# Patient Record
Sex: Male | Born: 1945 | Race: White | Hispanic: No | Marital: Married | Smoking: Never smoker
Health system: Southern US, Community
[De-identification: ages and names within clinical notes are randomized; demographics above are authoritative.]

## PROBLEM LIST (undated history)

## (undated) DIAGNOSIS — I2510 Atherosclerotic heart disease of native coronary artery without angina pectoris: Secondary | ICD-10-CM

## (undated) DIAGNOSIS — M069 Rheumatoid arthritis, unspecified: Secondary | ICD-10-CM

## (undated) DIAGNOSIS — C4490 Unspecified malignant neoplasm of skin, unspecified: Secondary | ICD-10-CM

## (undated) DIAGNOSIS — I10 Essential (primary) hypertension: Secondary | ICD-10-CM

## (undated) DIAGNOSIS — Z95810 Presence of automatic (implantable) cardiac defibrillator: Secondary | ICD-10-CM

## (undated) DIAGNOSIS — E782 Mixed hyperlipidemia: Secondary | ICD-10-CM

## (undated) DIAGNOSIS — N189 Chronic kidney disease, unspecified: Secondary | ICD-10-CM

## (undated) DIAGNOSIS — R001 Bradycardia, unspecified: Secondary | ICD-10-CM

## (undated) DIAGNOSIS — I509 Heart failure, unspecified: Secondary | ICD-10-CM

## (undated) DIAGNOSIS — I4891 Unspecified atrial fibrillation: Secondary | ICD-10-CM

## (undated) DIAGNOSIS — I4729 Other ventricular tachycardia: Secondary | ICD-10-CM

## (undated) DIAGNOSIS — I480 Paroxysmal atrial fibrillation: Secondary | ICD-10-CM

## (undated) DIAGNOSIS — I472 Ventricular tachycardia: Secondary | ICD-10-CM

## (undated) DIAGNOSIS — E7800 Pure hypercholesterolemia, unspecified: Secondary | ICD-10-CM

## (undated) DIAGNOSIS — K2270 Barrett's esophagus without dysplasia: Secondary | ICD-10-CM

## (undated) DIAGNOSIS — K219 Gastro-esophageal reflux disease without esophagitis: Secondary | ICD-10-CM

## (undated) DIAGNOSIS — Z87442 Personal history of urinary calculi: Secondary | ICD-10-CM

## (undated) DIAGNOSIS — I440 Atrioventricular block, first degree: Secondary | ICD-10-CM

## (undated) DIAGNOSIS — G4730 Sleep apnea, unspecified: Secondary | ICD-10-CM

## (undated) DIAGNOSIS — R7303 Prediabetes: Secondary | ICD-10-CM

## (undated) HISTORY — PX: HERNIA REPAIR: SHX51

## (undated) HISTORY — DX: Bradycardia, unspecified: R00.1

## (undated) HISTORY — DX: Other ventricular tachycardia: I47.29

## (undated) HISTORY — DX: Atherosclerotic heart disease of native coronary artery without angina pectoris: I25.10

## (undated) HISTORY — PX: KIDNEY STONE SURGERY: SHX686

## (undated) HISTORY — DX: Ventricular tachycardia: I47.2

## (undated) HISTORY — DX: Mixed hyperlipidemia: E78.2

## (undated) HISTORY — DX: Essential (primary) hypertension: I10

## (undated) HISTORY — DX: Barrett's esophagus without dysplasia: K22.70

## (undated) HISTORY — DX: Paroxysmal atrial fibrillation: I48.0

## (undated) HISTORY — PX: SKIN CANCER EXCISION: SHX779

## (undated) HISTORY — PX: INSERT / REPLACE / REMOVE PACEMAKER: SUR710

## (undated) HISTORY — DX: Atrioventricular block, first degree: I44.0

---

## 1977-03-13 HISTORY — PX: OTHER SURGICAL HISTORY: SHX169

## 2008-06-17 ENCOUNTER — Encounter: Payer: Self-pay | Admitting: Cardiology

## 2008-06-19 ENCOUNTER — Encounter: Payer: Self-pay | Admitting: Cardiology

## 2008-07-02 ENCOUNTER — Encounter: Payer: Self-pay | Admitting: Cardiology

## 2008-08-19 ENCOUNTER — Encounter: Payer: Self-pay | Admitting: Cardiology

## 2009-01-27 ENCOUNTER — Encounter: Payer: Self-pay | Admitting: Cardiology

## 2009-03-13 DIAGNOSIS — K2270 Barrett's esophagus without dysplasia: Secondary | ICD-10-CM

## 2009-03-13 HISTORY — DX: Barrett's esophagus without dysplasia: K22.70

## 2009-11-09 ENCOUNTER — Encounter: Payer: Self-pay | Admitting: Cardiology

## 2009-11-17 ENCOUNTER — Encounter: Payer: Self-pay | Admitting: Cardiology

## 2010-01-28 ENCOUNTER — Encounter: Payer: Self-pay | Admitting: Cardiology

## 2010-01-31 DIAGNOSIS — I10 Essential (primary) hypertension: Secondary | ICD-10-CM | POA: Insufficient documentation

## 2010-02-01 ENCOUNTER — Ambulatory Visit: Payer: Self-pay | Admitting: Cardiology

## 2010-02-01 DIAGNOSIS — M069 Rheumatoid arthritis, unspecified: Secondary | ICD-10-CM | POA: Insufficient documentation

## 2010-02-01 DIAGNOSIS — I493 Ventricular premature depolarization: Secondary | ICD-10-CM | POA: Insufficient documentation

## 2010-02-02 ENCOUNTER — Ambulatory Visit: Payer: Self-pay | Admitting: Cardiovascular Disease

## 2010-02-02 ENCOUNTER — Encounter: Payer: Self-pay | Admitting: Cardiology

## 2010-02-02 ENCOUNTER — Ambulatory Visit (HOSPITAL_COMMUNITY): Admission: RE | Admit: 2010-02-02 | Discharge: 2010-02-02 | Payer: Self-pay | Admitting: Cardiology

## 2010-02-02 ENCOUNTER — Ambulatory Visit: Payer: Self-pay | Admitting: Cardiology

## 2010-02-02 ENCOUNTER — Ambulatory Visit: Payer: Self-pay

## 2010-02-08 ENCOUNTER — Telehealth (INDEPENDENT_AMBULATORY_CARE_PROVIDER_SITE_OTHER): Payer: Self-pay | Admitting: *Deleted

## 2010-02-18 ENCOUNTER — Encounter: Payer: Self-pay | Admitting: Internal Medicine

## 2010-02-18 ENCOUNTER — Ambulatory Visit: Payer: Self-pay | Admitting: Internal Medicine

## 2010-03-02 ENCOUNTER — Ambulatory Visit: Payer: Self-pay | Admitting: Internal Medicine

## 2010-03-02 ENCOUNTER — Encounter: Payer: Self-pay | Admitting: Internal Medicine

## 2010-03-02 DIAGNOSIS — R5381 Other malaise: Secondary | ICD-10-CM | POA: Insufficient documentation

## 2010-03-02 DIAGNOSIS — R5383 Other fatigue: Secondary | ICD-10-CM

## 2010-03-02 DIAGNOSIS — R002 Palpitations: Secondary | ICD-10-CM | POA: Insufficient documentation

## 2010-03-18 ENCOUNTER — Ambulatory Visit: Admit: 2010-03-18 | Payer: Self-pay | Admitting: Cardiology

## 2010-04-11 ENCOUNTER — Ambulatory Visit
Admission: RE | Admit: 2010-04-11 | Discharge: 2010-04-11 | Payer: Self-pay | Source: Home / Self Care | Attending: Internal Medicine | Admitting: Internal Medicine

## 2010-04-11 DIAGNOSIS — I480 Paroxysmal atrial fibrillation: Secondary | ICD-10-CM | POA: Insufficient documentation

## 2010-04-12 NOTE — Letter (Signed)
Summary: Internal Other/ PATIENT HISTORY FORM  Internal Other/ PATIENT HISTORY FORM   Imported By: Dorise Hiss 02/09/2010 12:11:29  _____________________________________________________________________  External Attachment:    Type:   Image     Comment:   External Document

## 2010-04-12 NOTE — Letter (Signed)
Summary: External Correspondence/ [facility]  External Correspondence/ [facility]   Imported By: Dorise Hiss 01/10/2010 15:21:31  _____________________________________________________________________  External Attachment:    Type:   Image     Comment:   External Document

## 2010-04-12 NOTE — Consult Note (Signed)
Summary: Consultation Report/ [facility]  Consultation Report/ [facility]   Imported By: Dorise Hiss 01/10/2010 15:04:22  _____________________________________________________________________  External Attachment:    Type:   Image     Comment:   External Document

## 2010-04-12 NOTE — Letter (Signed)
Summary: External Correspondence/ [facility]  External Correspondence/ [facility]   Imported By: Dorise Hiss 01/10/2010 15:20:13  _____________________________________________________________________  External Attachment:    Type:   Image     Comment:   External Document

## 2010-04-12 NOTE — Assessment & Plan Note (Signed)
Summary: NP- establish WITH CARDIOLOGIST   Visit Type:  Initial Consult Primary Provider:  Noelle Penner)   History of Present Illness: the patient is a 65 year old male previously seen at [facility] with a history of palpitations, ventricular ectopic beats and paroxysmal atrial fibrillation. He is a former patient of Dr. Daryel November. 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 His catheterization was in 2005. An echocardiogram at that time revealed a normal ejection fraction and no significant valvular abnormalities. The patient was initially placed on atenolol and aspirin but could not tolerate atenolol. Due to frequent symptomatic palpitations he underwent a Holter monitor in March of 2010 which showed an average heart rate of 71 beats per minute. His minimal heart rate was 37 beats per minute and his maximal heart rate was 125 beats per minute. He 30,956 multiformed PVCs in a 48 hour period. The predominant PVC morphology appear to be from the right ventricular outflow tract. The longest run of PVCs of 6 beats of nonsustained ventricular tachycardia which was asymptomatic. He also had multiple PACs with the longest run being 3 beats. The patient had no documented atrial fibrillation during this period. Medical therapy was prescribed for suppression of PVCs due to the increased risk of tachycardia-induced cardiomyopathy, due to the high burden of PVCs. The patient was initial bisoprolol but for some unclear reason this has been discontinued. The records are somewhat scant and it appears that at some point he was also on Flecainidewhich he could not tolerate secondary to headaches. The patient had an MRI to rule out arrhythmogenic right ventricular dysplasia. 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 This was felt to be a  nonspecific finding. The patient now wants his care transferred to our office. He continues to have frequent palpitations. Particularly at night he feels sometimes his heart beating out of his chest. this does make her feel anxious at times. However throughout the day he can feel his heartbeat is irregular and clearly has symptomatic palpitations. He states the other night his heartbeat was irregular but it was very uncomfortable and had to sit outside of his carport. His blood pressure was elevated also at that time.  The patient abruptly 2 months ago has been started on prednisone and methotrexate for a presumptive diagnosis of rheumatoid arthritis. Since initiating prednisone he feels that his blood pressure has been much higher. Of note is that the patient's last echocardiogram at [facility] at 2010 normal ejection fraction. dr. Octaviano Glow in [place] is the patient's orthopedic surgeon his phone number is [phone].  Preventive Screening-Counseling & Management  Alcohol-Tobacco     Smoking Status: never  Current Medications (verified): 1)  Methotrexate 2.5 Mg Tabs (Methotrexate Sodium) .... Take 5 By Mouth Weekly 2)  Folic Acid 1 Mg Tabs (Folic Acid) .... Take 1 Tablet By Mouth Once A Day 3)  Prednisone 5 Mg Tabs (Prednisone) .... Take 1 Tablet By Mouth Once A Day 4)  Aspir-Low 81 Mg Tbec (Aspirin) .... Take 1 Tablet By Mouth Once A Day 5)  Multivitamins  Tabs (Multiple Vitamin) .... Take 1 Tablet By Mouth Once A Day 6)  Cranberry Juice Extract 1000 Mg Caps (Cranberry) .... Take 2 Tablet By Mouth Once A Day 7)  Vitamin B-12 1000 Mcg Tabs (Cyanocobalamin) .... Take 1 Tablet By Mouth Once A Day 8)  Nexium 40  Mg Cpdr (Esomeprazole Magnesium) .... Take 1 Tablet By Mouth Once A Day 9)  Acebutolol Hcl 400 Mg Caps (Acebutolol Hcl) .... Take 1 1/2 Tab (600mg ) Daily  Allergies (verified): No Known Drug Allergies  Comments:  Nurse/Medical Assistant: The patient's medication list and  allergies were reviewed with the patient and were updated in the Medication and Allergy Lists.  Past History:  Past Medical History: Last updated: 01/31/2010 Palpitations Ventricular ectopic beats Paroxysmal atrial fibrillation Long term use of ASA Rt. 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  The patient denies fatigue, malaise, fever, weight gain/loss, vision loss, decreased hearing, hoarseness, chest pain, shortness of breath, prolonged cough, wheezing, sleep apnea, coughing up blood, abdominal pain, blood in stool, nausea, vomiting, diarrhea, heartburn, incontinence, blood in urine, muscle weakness, joint pain, leg swelling, rash, skin lesions, headache, fainting, dizziness, depression, anxiety, enlarged lymph nodes, easy bruising or bleeding, and environmental allergies.    Vital Signs:  Patient profile:   65 year old male Height:      67 inches Weight:      150 pounds BMI:     23.58 Pulse rate:   65 / minute BP sitting:   171 / 77  (left arm) Cuff size:   regular  Vitals Entered By: Carlye Grippe (February 01, 2010 8:30 AM)   Physical Exam  Additional Exam:  General: Well-developed, well-nourished in no distress head: Normocephalic and atraumatic eyes PERRLA/EOMI  intact, conjunctiva and lids normal nose: No deformity or lesions mouth normal dentition, normal posterior pharynx neck: Supple, no JVD.  No masses, thyromegaly or abnormal cervical nodes lungs: Normal breath sounds bilaterally without wheezing.  Normal percussion heart: regular rate and rhythm with normal S1 and S2, no S3 or S4.  PMI is normal.  No pathological murmurs abdomen: Normal bowel sounds, abdomen is soft and nontender without masses, organomegaly or hernias noted.  No hepatosplenomegaly musculoskeletal: Back normal, normal gait muscle strength and tone normal pulsus: Pulse is normal in all 4 extremities Extremities: No peripheral pitting edema neurologic: Alert and oriented x 3 skin: Intact without lesions or rashes cervical nodes: No significant adenopathy psychologic: Normal affect    EKG  Procedure date:  02/01/2010  Findings:      EKG with baseline wandering. Sinus rhythm with first degree AV block. No acute ischemic changes. 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 This was however several years ago. Currently the patient is not on beta blocker anymore.I am not exactly sure why bisoprolol was discontinued. The patient had an MRI done which was reportedly negative for possibility of arrhythmogenic right  ventricular dysplasia. However the test is that of 100% sensitivity. On the present EKG there are no PVCs to suggest where the arrhythmogenic focus is. 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 I still wonder if this could raise the possibility of a ARVC. We will repeat an echocardiogram to reassess LV function, particularly to rule out that the fact the  patient does not have a tachycardia induced cardiomyopathy from frequent PVCs. In addition we'll evaluate RV function. I also ordered a 48-hour Holter monitor to assess the frequency of his PVCs as well as to further understand the morphology. In the interim I started the patient on acebutolol 600 milligrams p.o. q. daily.hopefully he can tolerate his beta blocker better than the atenolol and bisoprolol. Again of note is that he has been tried on flecainide before but could not tolerate this. I have referred the patient to Dr. Johney Frame to make sure that he truly has ventricular tachycardia/PVCs in the absence of structural heart disease and no further workup is required particularly for arrhythmogenic right ventricular dysplasia. Also if the patient does not respond to his medical therapy consideration may be given to EP study and RVOT ablation . The following medications were removed from the medication list:    Bisoprolol Fumarate 5 Mg Tabs (Bisoprolol fumarate) .[name] Kitchen... Take 1/2 tablet by mouth once a day His updated medication list for this problem includes:    Aspir-low 81 Mg Tbec (Aspirin) .[name] Kitchen... Take 1 tablet by mouth once a day    Acebutolol Hcl 400 Mg Caps (Acebutolol hcl) .[name] Kitchen... 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 Take 1/2 tablet by mouth once a day His updated medication list for this problem includes:    Aspir-low 81 Mg Tbec (Aspirin) .[name] Kitchen... Take 1 tablet by mouth once a day    Acebutolol Hcl 400 Mg Caps (Acebutolol hcl) .[name] Kitchen... 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 He feels his blood pressure is elevated  after he started to medications. This likely could be from his prednisone. I told him the vestibule is also on antihypertensive medication. We will continue to monitor this  Other Orders: EKG w/ Interpretation (93000) Holter Monitor (Holter Monitor) 2-D Echocardiogram (2D Echo)  Patient Instructions: 1)  Begin Acebutolol 600mg  daily 2)  Echo  3)  48 hour monitor 4)  EP appointment with Dr. Johney Frame  5)  Follow up in  4 weeks Prescriptions: ACEBUTOLOL HCL 400 MG CAPS (ACEBUTOLOL HCL) take 1 1/2 tab (600mg ) daily  #45 x 6   Entered by:   Hoover Brunette, LPN   Authorized by:   Lewayne Bunting, MD, [facility]   Signed by:   Hoover Brunette, LPN on 98/01/9146   Method used:   Electronically to        [facility] [phone]* (retail)       [address] [place], [place]  [address]       Ph: [phone]       Fax: [phone]   RxID:   [id number]   Handout requested.

## 2010-04-12 NOTE — Letter (Signed)
Summary: External Correspondence/ [facility]  External Correspondence/ [facility]   Imported By: Dorise Hiss 01/13/2010 15:16:19  _____________________________________________________________________  External Attachment:    Type:   Image     Comment:   External Document

## 2010-04-12 NOTE — Progress Notes (Signed)
Summary: SIDE EFFECTS TO ACEBUTOLOL INCREASE  Phone Note Call from Patient Call back at Home Phone [phone]   Caller: Patient Call For: nurse Summary of Call: since taking increase of acebutolol patient has been itching. Please advise. Initial call taken by: Carlye Grippe,  February 08, 2010 8:20 AM  Follow-up for Phone Call        would call back and see if itching is persistent.  If not continue acebutolol probably unlikely that medication is the cause, but possible.  Follow-up by: Lewayne Bunting, MD, [facility],  February 18, 2010 9:17 AM  Additional Follow-up for Phone Call Additional follow up Details #1::        spoke with patient and he said that he decreased the medication back to 400mg  and the itching went away. Additional Follow-up by: Carlye Grippe,  February 18, 2010 9:35 AM    Additional Follow-up for Phone Call Additional follow up Details #2::    OK- continue curent dose.  Follow-up by: Lewayne Bunting, MD, [facility],  February 18, 2010 10:14 AM

## 2010-04-14 NOTE — Assessment & Plan Note (Signed)
Summary: ROV REASSES HEART RATE AND HOW PT IS FEELING/KL   Visit Type:  Follow-up Referring Provider:  Dr Andee Lineman Primary Provider:  Noelle Penner)   History of Present Illness: The patient presents today for routine electrophysiology followup. He reports doing reasonably well since last being seen in our clinic.  He has stopped acetabulol but continues to have persistent fatigue.  He also has occasional palpitations, worse at night.  He remains very active. The patient denies symptoms of  chest pain, shortness of breath, orthopnea, PND, lower extremity edema, dizziness, presyncope, syncope, or neurologic sequela. The patient is tolerating medications without difficulties and is otherwise without complaint today.   Preventive Screening-Counseling & Management  Alcohol-Tobacco     Smoking Status: never  Current Medications (verified): 1)  Multivitamins  Tabs (Multiple Vitamin) .... Take 1 Tablet By Mouth Once A Day 2)  Cranberry Juice Extract 1000 Mg Caps (Cranberry) .... Take 2 Tablet By Mouth Once A Day 3)  Vitamin B-12 1000 Mcg Tabs (Cyanocobalamin) .... Take 1 Tablet By Mouth Once A Day 4)  Nexium 40 Mg Cpdr (Esomeprazole Magnesium) .... Take 1 Tablet By Mouth Once A Day  Allergies (verified): No Known Drug Allergies  Comments:  Nurse/Medical Assistant: The patient's problems were reviewed and were updated in the Problem List.  Bottles reviewed w/ patient.  Tammi Romine CMA (March 02, 2010 1:45 PM)  Past History:  Past Medical History: Reviewed history from 02/18/2010 and no changes required. Frequent PVCs Paroxysmal atrial fibrillation (not well documented) Rheumatoid arthritis Nephrolithiasis Resting bradycardia Left heart catheterization 2005 which revealed a nonobstructive disease.  Past Surgical History: Reviewed history from 02/18/2010 and no changes required. Cardiac Catheterization  2005 revealed a 25% mid LAD lesion and otherwise normal  coronaries His EF at that time 2005 was 60% left leg surgery 1979 Hernia repair   Social History: Reviewed history from 02/18/2010 and no changes required. 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 High School Education Alcohol Use - occasional Tob- none Has one son who is healthy  Review of Systems       All systems are reviewed and negative except as listed in the HPI.   Vital Signs:  Patient profile:   65 year old male Height:      67 inches Weight:      151 pounds BMI:     23.74 Pulse rate:   65 / minute BP sitting:   166 / 90  (left arm) Cuff size:   regular  Vitals Entered By: Fuller Plan CMA (March 02, 2010 1:19 PM)  Physical Exam  General:  Well developed, well nourished, in no acute distress. Head:  normocephalic and atraumatic Eyes:  PERRLA/EOM intact; conjunctiva and lids normal. Mouth:  Teeth, gums and palate normal. Oral mucosa normal. Neck:  Neck supple, no JVD. No masses, thyromegaly or abnormal cervical nodes. Lungs:  Clear bilaterally to auscultation and percussion. Heart:  bradycardic regular rhythm, no m/r/g Abdomen:  Bowel sounds positive; abdomen soft and non-tender without masses, organomegaly, or hernias noted. No hepatosplenomegaly. Msk:  Back normal, normal gait. Muscle strength and tone normal. Extremities:  No clubbing or cyanosis. Neurologic:  Alert and oriented x 3. Skin:  Intact without lesions or rashes. Psych:  Normal affect.   EKG  Procedure date:  03/02/2010  Findings:      sinus bradycardia 53 bpm, PR 274, Qtc 386, otherwise normal ekg  Impression & Recommendations:  Problem # 1:  PALPITATIONS (ICD-785.1) unclear etiology (afib vs PVCs),  recent holter revealed relatively low PVC burden and no afib will continue to follow and consider 21 day event monitor if worsens.  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 Johney Frame on Monday, April 04, 2010 at 4pm. 2)  Your physician recommends that you go to the [facility] for lab work. 3)  Your physician recommends that you continue on your current medications as directed. Please refer to the Current Medication list given to you today.

## 2010-04-14 NOTE — Assessment & Plan Note (Signed)
Summary: nep/unspecified cardiac dysrythmia/mt   Visit Type:  Initial Consult Referring Provider:  Dr Andee Lineman Primary Provider:  Noelle Penner)   History of Present Illness: Mr Tollison is a pleasant 65 yo WM with a h/o sypmtomatic PVCs who presents today for EP consultation.  He reports initially being diagnosed with PVCs on routine physical 2005 by his PCP.  He was evaluated by Dr Berneice Gandy at [facility] and placed on bisoprolol.  He did not tolerate his beta blocker due to fatigue and severe headaches.  He reports similar symptoms with flecainide.   Due to frequent symptomatic palpitations he underwent a Holter monitor in March of 2010 which showed an average heart rate of 71 beats per minute. His minimal heart rate was 37 beats per minute and his maximal heart rate was 125 beats per minute. He had > 30,000  PVCs in a 48 hour period.  He also had multiple PACs. The patient had no documented atrial fibrillation during this period.   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 This was felt to be a nonspecific finding. He was recently evaluated by Dr Andee Lineman for his PVCs.  He reported symptoms of fatigue and frequent palpitations, most noticable at night. He was placed on acetabulol which he has not tolerated due to extreme fatgiue and decreased exercise tolerance.  He denies CP, SOB, presyncope, dizziness, or syncope.  He had a follow-up holter monitor which revealed suppressed PVCs.  He now presents for EP consultation.  Current Medications (verified): 1)  Aspir-Low 81 Mg Tbec (Aspirin) .... Take 1 Tablet By Mouth Once A Day 2)  Multivitamins  Tabs (Multiple Vitamin) .... Take 1 Tablet By Mouth Once A Day 3)  Cranberry Juice Extract 1000 Mg Caps (Cranberry) .... Take 2 Tablet By Mouth Once A Day 4)  Vitamin B-12 1000 Mcg Tabs (Cyanocobalamin) .... Take 1 Tablet By Mouth Once A  Day 5)  Nexium 40 Mg Cpdr (Esomeprazole Magnesium) .... Take 1 Tablet By Mouth Once A Day 6)  Acebutolol Hcl 400 Mg Caps (Acebutolol Hcl) .... 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 High School Education Alcohol Use - occasional Tob- none Has one son who is healthy  Review of Systems       All systems are reviewed and negative except as listed in the HPI.    Vital Signs:  Patient profile:   65 year old male Height:      67 inches Weight:      149 pounds BMI:     23.42 Pulse rate:   48 / minute BP sitting:   130 / 64  (left arm)  Vitals Entered By: Laurance Flatten CMA (February 18, 2010 11:49 AM)  Physical Exam  General:  Well developed, well nourished, in  no acute distress. Head:  normocephalic and atraumatic Eyes:  PERRLA/EOM intact; conjunctiva and lids normal. Mouth:  Teeth, gums and palate normal. Oral mucosa normal. Neck:  Neck supple, no JVD. No masses, thyromegaly or abnormal cervical nodes. Lungs:  Clear bilaterally to auscultation and percussion. Heart:  bradycardic regular rhythm, no m/r/g Abdomen:  Bowel sounds positive; abdomen soft and non-tender without masses, organomegaly, or hernias noted. No hepatosplenomegaly. Msk:  Back normal, normal gait. Muscle strength  and tone normal. Pulses:  pulses normal in all 4 extremities Extremities:  No clubbing or cyanosis. Neurologic:  Alert and oriented x 3. Skin:  Intact without lesions or rashes. Psych:  Normal affect.   Echocardiogram  Procedure date:  02/02/2010  Findings:      Study Conclusions    - Left ventricle: The cavity size was normal. Wall thickness was     normal. Systolic function was normal. The estimated ejection     fraction was in the range of 60% to 65%. Wall motion was normal;     there were no regional wall motion abnormalities. Doppler     parameters are consistent with abnormal left ventricular     relaxation (grade 1 diastolic dysfunction).   - Mitral valve: Mild regurgitation.   - Left atrium: The atrium was mildly dilated.   Transthoracic echocardiography. M-mode, complete 2D, spectral   Doppler, and color Doppler. Height: Height: 170.2cm. Height: 67in.   Weight: Weight: 68kg. Weight: 149.7lb. Body mass index: BMI:   23.5kg/m^2. Body surface area: BSA: 1.71m^2. Blood pressure: 121/68.   Patient status: Outpatient. Location: [place] [facility] AP dim           42 mm     ------  VTI, S      16.6 cm        ------   AP dim index   2.35 cm/m^2 <2.2    HR            54 bpm       ------                Prepared and Electronically Authenticated by    Kristeen Miss, MD   [id number]     EKG  Procedure date:  02/18/2010  Findings:      sinus bradycardia 48 bpm, PR 282, otherwise normal ekg  Holter Monitor  Procedure date:  02/02/2010  Findings:      sinus rhythm,  heart rate range 44-103 bpm, average HR 63 bpm. first degree AV block PACs and nonsustained atrial tachycardia, only 242 total PVCs.  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  Though these are presently well controlled with acetabulol (only 243 pvcs only recent holter), he is  not tolerating his beta blocker and has developed symptomatic bradycardia. He has not tolerated other beta blockers or flecainide previously. Therapeutic strategies for his PVCs including medicine and ablation were discussed in detail with the patient today.  He wishes to consider catheter ablation.   At this time, we will stop his beta blocker and observe.  If his PVCs return, then we should proceed with catheter ablation.  He is aware that in order to successfully map and ablate his PVCs, they have to be occuring frequently.  Problem # 2:  HYPERTENSION (ICD-401.9) stable no changes  Patient Instructions: 1)  Your physician recommends that you schedule a follow-up appointment in: 03/02/10 in  [place] per Dr Johney Frame 2)  Your physician has recommended you make the following change in your medication: decrease Acebutolol to 1/2 tablet daily for 3 days then stop

## 2010-04-20 NOTE — Assessment & Plan Note (Signed)
Summary: 1 MO F/U PER 12/21 OV W/DR. Quintara Bost-JM   Referring Provider:  Dr Andee Lineman Primary Provider:  Noelle Penner)   History of Present Illness: The patient presents today for routine electrophysiology followup. He reports doing reasonably well since last being seen in our clinic.  His fatigue appears to be better and his palpitations have improved.  He remains very active.  He continues to struggle with arthritis.  The patient denies symptoms of  chest pain, shortness of breath, orthopnea, PND, lower extremity edema, dizziness, presyncope, syncope, or neurologic sequela. The patient is tolerating medications without difficulties and is otherwise without complaint today.   Current Medications (verified): 1)  Multivitamins  Tabs (Multiple Vitamin) .... Take 1 Tablet By Mouth Once A Day 2)  Cranberry Juice Extract 1000 Mg Caps (Cranberry) .... Take 2 Tablet By Mouth Once A Day 3)  Vitamin B-12 1000 Mcg Tabs (Cyanocobalamin) .... Take 1 Tablet By Mouth Once A Day 4)  Nexium 40 Mg Cpdr (Esomeprazole Magnesium) .... Take 1 Tablet By Mouth Once A Day 5)  Aspirin 81 Mg Tbec (Aspirin) .... 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 HTN  Past Surgical History: Reviewed history from 02/18/2010 and no changes required. Cardiac Catheterization  2005 revealed a 25% mid LAD lesion and otherwise normal coronaries His EF at that time 2005 was 60% left leg surgery 1979 Hernia repair  Social History: Reviewed history from 02/18/2010 and no changes required. 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 High School Education Alcohol Use - occasional Tob- none Has  one son who is healthy  Vital Signs:  Patient profile:   65 year old male Height:      67 inches Weight:      152 pounds BMI:     23.89 Pulse rate:   68 / minute BP sitting:   140 / 70  (left arm)  Vitals Entered By: Laurance Flatten CMA (April 11, 2010 9:58 AM)  Physical Exam  General:  Well developed, well nourished, in no acute distress. Head:  normocephalic and atraumatic Eyes:  PERRLA/EOM intact; conjunctiva and lids normal. Mouth:  Teeth, gums and palate normal. Oral mucosa normal. Neck:  supple Lungs:  Clear bilaterally to auscultation and percussion. Heart:  RRR, no m/r/g, no ectopy while listening for 60 seconds Abdomen:  Bowel sounds positive; abdomen soft and non-tender without masses, organomegaly, or hernias noted. No hepatosplenomegaly. Msk:  Back normal, normal gait. Muscle strength and tone normal. Extremities:  No clubbing or cyanosis. Neurologic:  Alert and oriented x 3. Skin:  Intact without lesions or rashes.   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) much improved off medication his ectopy is too infrequent for ablation if palpitations increase, we will consider a 21 day event monitor  Problem # 2:  HYPERTENSION (ICD-401.9) stable salt restriction advised if worse, we will consider a low dose diuretic vs ACE inhibitor  Problem # 3:  ATRIAL FIBRILLATION (ICD-427.31) previously not well documented continue ASA  Problem # 4:  ARTHRITIS, RHEUMATOID, SERONEGATIVE (ICD-714.0) pt to follow-up with his primary physcian, he should avoid NSAIDS/ steroids as above as these will contribute to palpitations, htn, and fluid retention  Patient Instructions: 1)  Your physician wants you  to follow-up in: 4 months with Dr Jacquiline Doe will receive a reminder letter in the mail two months in advance. If you don't receive a letter, please call our office to schedule the follow-up appointment. 2)  Your  physician recommends that you continue on your current medications as directed. Please refer to the Current Medication list given to you today.

## 2012-01-18 ENCOUNTER — Encounter: Payer: Self-pay | Admitting: Internal Medicine

## 2012-01-18 ENCOUNTER — Ambulatory Visit (INDEPENDENT_AMBULATORY_CARE_PROVIDER_SITE_OTHER): Payer: Managed Care, Other (non HMO) | Admitting: Internal Medicine

## 2012-01-18 VITALS — BP 162/82 | HR 80 | Ht 68.0 in | Wt 151.0 lb

## 2012-01-18 DIAGNOSIS — R079 Chest pain, unspecified: Secondary | ICD-10-CM

## 2012-01-18 DIAGNOSIS — I4949 Other premature depolarization: Secondary | ICD-10-CM

## 2012-01-18 DIAGNOSIS — I4891 Unspecified atrial fibrillation: Secondary | ICD-10-CM

## 2012-01-18 DIAGNOSIS — E785 Hyperlipidemia, unspecified: Secondary | ICD-10-CM

## 2012-01-18 DIAGNOSIS — I10 Essential (primary) hypertension: Secondary | ICD-10-CM

## 2012-01-18 MED ORDER — NITROGLYCERIN 0.4 MG SL SUBL: 0.4000 mg | SUBLINGUAL_TABLET | SUBLINGUAL | Status: DC | PRN

## 2012-01-18 MED ORDER — ASPIRIN EC 81 MG PO TBEC: 81.0000 mg | DELAYED_RELEASE_TABLET | Freq: Every day | ORAL | Status: DC

## 2012-01-18 NOTE — Patient Instructions (Addendum)
Your physician recommends that you schedule a follow-up appointment in: 4 weeks withDr Allred  Your physician has requested that you have en exercise stress myoview. For further information please visit [email or web address] Please follow instruction sheet, as given.  Your physician recommends that you return for lab work same day as stress test--lipid/liver/HbG A1C  Your physician has recommended you make the following change in your medication:  1) Start Aspirin 81mg  daily 2) NTG 0.4mg  as directed

## 2012-01-21 DIAGNOSIS — R079 Chest pain, unspecified: Secondary | ICD-10-CM | POA: Insufficient documentation

## 2012-01-21 NOTE — Assessment & Plan Note (Signed)
Stable No change required today  

## 2012-01-21 NOTE — Assessment & Plan Note (Addendum)
Exertional chest pain and SOB are worrisome for ischemia I will order a myoview for further risk stratification.  Start ASA 81mg  daily Check fasting lipid profile and A1C to further evaluate for CAD risk factors  He is given slNTG prescription with instructions.  He will call EMS if he develops chest pain not relived with NTG.

## 2012-01-21 NOTE — Progress Notes (Signed)
   The patient presents today for routine electrophysiology followup.  Since last being seen in our clinic, the patient reports doing very well.  His PVCs seem to have pretty much resolved.  Over the past few weeks however, he has noticed substernal chest pain.  This occurs with moderate exertion at work.  He reports moderate chest tightness with associated SOB.  Today, he denies symptoms of palpitations, orthopnea, PND, lower extremity edema, dizziness, presyncope, syncope, or neurologic sequela.  The patient feels that he is tolerating medications without difficulties and is otherwise without complaint today.   No past medical history on file. Past Surgical History  Procedure Date  . Leg surgery 1979    left  . Hernia repair     2  . Kidney stone surgery     Current Outpatient Prescriptions  Medication Sig Dispense Refill  . Cranberry (SM CRANBERRY) 300 MG tablet Take 300 mg by mouth daily.      [name] Kitchen glucosamine-chondroitin 500-400 MG tablet Take 1 tablet by mouth daily.      . Multiple Vitamin (MULTIVITAMIN) tablet Take 1 tablet by mouth daily.      . vitamin B-12 (CYANOCOBALAMIN) 1000 MCG tablet Take 1,000 mcg by mouth daily.      [name] Kitchen aspirin EC 81 MG tablet Take 1 tablet (81 mg total) by mouth daily.  90 tablet  3  . nitroGLYCERIN (NITROSTAT) 0.4 MG SL tablet Place 1 tablet (0.4 mg total) under the tongue every 5 (five) minutes as needed for chest pain.  25 tablet  5    No Known Allergies  History   Social History  . Marital Status: Married    Spouse Name: N/A    Number of Children: 1  . Years of Education: N/A   Occupational History  . Quarry manager    Social History Main Topics  . Smoking status: Never Smoker   . Smokeless tobacco: Not on file  . Alcohol Use: Yes     Comment: occasional beer  . Drug Use: No  . Sexually Active: Not on file   Other Topics Concern  . Not on file   Social History Narrative  . No narrative on file    No family history  on file.  ROS-  All systems are reviewed and are negative except as outlined in the HPI above  Physical Exam: Filed Vitals:   01/18/12 1112  BP: 162/82  Pulse: 80  Height: 5\' 8"  (1.727 m)  Weight: 151 lb (68.493 kg)  SpO2: 86%    GEN- The patient is well appearing, alert and oriented x 3 today.   Head- normocephalic, atraumatic Eyes-  Sclera clear, conjunctiva pink Ears- hearing intact Oropharynx- clear Neck- supple, no JVP Lymph- no cervical lymphadenopathy Lungs- Clear to ausculation bilaterally, normal work of breathing Heart- Regular rate and rhythm, no murmurs, rubs or gallops, PMI not laterally displaced GI- soft, NT, ND, + BS Extremities- no clubbing, cyanosis, or edema MS- no significant deformity or atrophy Skin- no rash or lesion Psych- euthymic mood, full affect Neuro- strength and sensation are intact  ekg today reveals sinus rhythm 69 bpm, septal infarction, otherwise normal ekg  Assessment and Plan:

## 2012-01-21 NOTE — Assessment & Plan Note (Signed)
resolved 

## 2012-01-24 ENCOUNTER — Other Ambulatory Visit (INDEPENDENT_AMBULATORY_CARE_PROVIDER_SITE_OTHER): Payer: Managed Care, Other (non HMO)

## 2012-01-24 ENCOUNTER — Ambulatory Visit (HOSPITAL_COMMUNITY): Payer: Managed Care, Other (non HMO) | Attending: Cardiovascular Disease | Admitting: Radiology

## 2012-01-24 VITALS — BP 151/79 | Ht 68.0 in | Wt 153.0 lb

## 2012-01-24 DIAGNOSIS — R002 Palpitations: Secondary | ICD-10-CM | POA: Insufficient documentation

## 2012-01-24 DIAGNOSIS — E785 Hyperlipidemia, unspecified: Secondary | ICD-10-CM

## 2012-01-24 DIAGNOSIS — I4891 Unspecified atrial fibrillation: Secondary | ICD-10-CM

## 2012-01-24 DIAGNOSIS — R0989 Other specified symptoms and signs involving the circulatory and respiratory systems: Secondary | ICD-10-CM | POA: Insufficient documentation

## 2012-01-24 DIAGNOSIS — R5381 Other malaise: Secondary | ICD-10-CM | POA: Insufficient documentation

## 2012-01-24 DIAGNOSIS — R61 Generalized hyperhidrosis: Secondary | ICD-10-CM | POA: Insufficient documentation

## 2012-01-24 DIAGNOSIS — R079 Chest pain, unspecified: Secondary | ICD-10-CM | POA: Insufficient documentation

## 2012-01-24 DIAGNOSIS — R0609 Other forms of dyspnea: Secondary | ICD-10-CM | POA: Insufficient documentation

## 2012-01-24 DIAGNOSIS — I10 Essential (primary) hypertension: Secondary | ICD-10-CM | POA: Insufficient documentation

## 2012-01-24 DIAGNOSIS — R0602 Shortness of breath: Secondary | ICD-10-CM | POA: Insufficient documentation

## 2012-01-24 LAB — LIPID PANEL
Cholesterol: 173 mg/dL (ref 0–200)
LDL Cholesterol: 94 mg/dL (ref 0–99)
Total CHOL/HDL Ratio: 3
Triglycerides: 96 mg/dL (ref 0.0–149.0)
VLDL: 19.2 mg/dL (ref 0.0–40.0)

## 2012-01-24 LAB — HEPATIC FUNCTION PANEL
Albumin: 3.6 g/dL (ref 3.5–5.2)
Alkaline Phosphatase: 45 U/L (ref 39–117)
Bilirubin, Direct: 0.1 mg/dL (ref 0.0–0.3)
Total Protein: 6.6 g/dL (ref 6.0–8.3)

## 2012-01-24 MED ORDER — TECHNETIUM TC 99M SESTAMIBI GENERIC - CARDIOLITE
10.0000 | Freq: Once | INTRAVENOUS | Status: AC | PRN
  Administered 2012-01-24: 10 via INTRAVENOUS

## 2012-01-24 MED ORDER — TECHNETIUM TC 99M SESTAMIBI GENERIC - CARDIOLITE
30.0000 | Freq: Once | INTRAVENOUS | Status: AC | PRN
  Administered 2012-01-24: 30 via INTRAVENOUS

## 2012-01-24 NOTE — Progress Notes (Signed)
[facility] SITE 3 NUCLEAR MED [address] [id number] [place] [address] [phone]  Cardiology Nuclear Med Study  Brent Ray is a 66 y.o. male     MRN : [id number]     DOB: 1945/09/17  Procedure Date: 01/24/2012  Nuclear Med Background Indication for Stress Test:  Evaluation for Ischemia History:  AFIB,2005 Heart Cath: N/O CAD EF: 60% 25% LAD, 07/02/08 MPS: (-) ischemia EF: 60%, 02/02/10: MVP-ECHO: mild MVP EF: 60-65% Cardiac Risk Factors: Hypertension  Symptoms:  Chest Pain, Diaphoresis, DOE, Fatigue, Palpitations and SOB   Nuclear Pre-Procedure Caffeine/Decaff Intake:  None NPO After: 5pm   Lungs:  clear O2 Sat: 98% on room air. IV 0.9% NS with Angio Cath:  22g  IV Site: R Wrist  IV Started by:  Bonnita Levan, RN  Chest Size (in):  42 Cup Size: n/a  Height: 5\' 8"  (1.727 m)  Weight:  153 lb (69.4 kg)  BMI:  Body mass index is 23.26 kg/(m^2). Tech Comments:  N/A    Nuclear Med Study 1 or 2 day study: 1 day  Stress Test Type:  Stress  Reading MD: Kristeen Miss, MD  Order Authorizing Provider:  Hillis Range, MD  Resting Radionuclide: Technetium 37m Sestamibi  Resting Radionuclide Dose: 11.0 mCi   Stress Radionuclide:  Technetium 30m Sestamibi  Stress Radionuclide Dose: 33.0 mCi           Stress Protocol Rest HR: 55 Stress HR: 134  Rest BP: 151/79 Stress BP: 219/80  Exercise Time (min): 7:33 METS: 9.0   Predicted Max HR: 156 bpm % Max HR: 85.9 bpm Rate Pressure Product: 53664   Dose of Adenosine (mg):  n/a Dose of Lexiscan: n/a mg  Dose of Atropine (mg): n/a Dose of Dobutamine: n/a mcg/kg/min (at max HR)  Stress Test Technologist: Frederick Peers, EMT-P  Nuclear Technologist:  Doyne Keel, CNMT     Rest Procedure:  Myocardial perfusion imaging was performed at rest 45 minutes following the intravenous administration of Technetium 65m Sestamibi. Rest ECG: Sinus Bradycardia with 1st degree AVB  Stress Procedure:  The patient  performed treadmill exercise using a Bruce  Protocol for 7:33 minutes. The patient stopped due to fatigue, sob, and denied any chest pain.  There were non specific ST-T wave changes and rare pvcs.  Technetium 24m Sestamibi was injected at peak exercise and myocardial perfusion imaging was performed after a brief delay. Stress ECG: No significant change from baseline ECG  QPS Raw Data Images:  Normal; no motion artifact; normal heart/lung ratio. Stress Images:  Normal homogeneous uptake in all areas of the myocardium. Rest Images:  Normal homogeneous uptake in all areas of the myocardium. Subtraction (SDS):  No evidence of ischemia. Transient Ischemic Dilatation (Normal <1.22):  1.02 Lung/Heart Ratio (Normal <0.45):  0.35  Quantitative Gated Spect Images QGS EDV:  102 ml QGS ESV:  46 ml  Impression Exercise Capacity:  Good exercise capacity. BP Response:  Hypertensive blood pressure response. Clinical Symptoms:  No significant symptoms noted. ECG Impression:  No significant ST segment change suggestive of ischemia. Comparison with Prior Nuclear Study: No images to compare  Overall Impression:  Normal stress nuclear study.  No evidence of ischemia.    LV Ejection Fraction: 55%.  LV Wall Motion:  NL LV Function; NL Wall Motion.   Brent Ray, Brent Hageman., MD, [facility] 01/24/2012, 5:32 PM Office - [phone] Pager [phone]

## 2012-02-21 ENCOUNTER — Encounter: Payer: Self-pay | Admitting: Internal Medicine

## 2012-02-21 ENCOUNTER — Ambulatory Visit (INDEPENDENT_AMBULATORY_CARE_PROVIDER_SITE_OTHER): Payer: Managed Care, Other (non HMO) | Admitting: Internal Medicine

## 2012-02-21 VITALS — BP 144/84 | HR 53 | Ht 68.0 in | Wt 156.0 lb

## 2012-02-21 DIAGNOSIS — R079 Chest pain, unspecified: Secondary | ICD-10-CM

## 2012-02-21 DIAGNOSIS — I4949 Other premature depolarization: Secondary | ICD-10-CM

## 2012-02-21 DIAGNOSIS — I10 Essential (primary) hypertension: Secondary | ICD-10-CM

## 2012-02-21 NOTE — Patient Instructions (Signed)
Your physician recommends that you schedule a follow-up appointment in: 3 months with Dr Diona Browner or Gene Serpe Your physician wants you to follow-up in: 6 months with Dr Johney Frame Bonita Quin will receive a reminder letter in the mail two months in advance. If you don't receive a letter, please call our office to schedule the follow-up appointment.

## 2012-02-21 NOTE — Assessment & Plan Note (Signed)
Significantly improved and somewhat atypical Recent myoview was normal No further workup at this time He will contact my office if he has sustained or worsening chest pain

## 2012-02-21 NOTE — Assessment & Plan Note (Signed)
Slightly elevated today Repeat by MD is 140/ 70 2 gram sodium diet is recommendation He is reluctant to consider medicine changes at this time

## 2012-02-21 NOTE — Progress Notes (Signed)
  Brent Ray is a 66 y.o. male who presents today for routine cardiology followup.  Since last being seen in our clinic, the patient reports doing very well.  He has only very chest heaviness at this point.  He denies SOB or symptoms of PVCS.  Today, he denies symptoms of palpitations,  shortness of breath,  lower extremity edema, dizziness, presyncope, or syncope.  The patient is otherwise without complaint today.   No past medical history on file. Past Surgical History  Procedure Date  . Leg surgery 1979    left  . Hernia repair     2  . Kidney stone surgery     Current Outpatient Prescriptions  Medication Sig Dispense Refill  . aspirin EC 81 MG tablet Take 1 tablet (81 mg total) by mouth daily.  90 tablet  3  . Cranberry (SM CRANBERRY) 300 MG tablet Take 300 mg by mouth daily.      [name] Kitchen glucosamine-chondroitin 500-400 MG tablet Take 1 tablet by mouth daily.      . Multiple Vitamin (MULTIVITAMIN) tablet Take 1 tablet by mouth daily.      . nitroGLYCERIN (NITROSTAT) 0.4 MG SL tablet Place 1 tablet (0.4 mg total) under the tongue every 5 (five) minutes as needed for chest pain.  25 tablet  5  . vitamin B-12 (CYANOCOBALAMIN) 1000 MCG tablet Take 1,000 mcg by mouth daily.        Physical Exam: Filed Vitals:   02/21/12 1209  BP: 144/84  Pulse: 53  Height: 5\' 8"  (1.727 m)  Weight: 156 lb (70.761 kg)    GEN- The patient is well appearing, alert and oriented x 3 today.   Head- normocephalic, atraumatic Eyes-  Sclera clear, conjunctiva pink Ears- hearing intact Oropharynx- clear Lungs- Clear to ausculation bilaterally, normal work of breathing Heart- Regular rate and rhythm, no murmurs, rubs or gallops, PMI not laterally displaced GI- soft, NT, ND, + BS Extremities- no clubbing, cyanosis, or edema  myoview- no ischemia  Assessment and Plan:

## 2012-02-21 NOTE — Assessment & Plan Note (Signed)
Improved.  No changes.

## 2012-03-13 HISTORY — PX: ESOPHAGOGASTRODUODENOSCOPY: SHX1529

## 2012-03-13 HISTORY — PX: COLONOSCOPY: SHX174

## 2012-04-27 ENCOUNTER — Other Ambulatory Visit: Payer: Self-pay

## 2012-08-19 ENCOUNTER — Encounter: Payer: Self-pay | Admitting: Physician Assistant

## 2012-08-19 DIAGNOSIS — R079 Chest pain, unspecified: Secondary | ICD-10-CM

## 2012-08-20 ENCOUNTER — Encounter: Payer: Self-pay | Admitting: Physician Assistant

## 2012-08-20 ENCOUNTER — Encounter (HOSPITAL_COMMUNITY): Payer: Self-pay | Admitting: Cardiology

## 2012-08-20 ENCOUNTER — Other Ambulatory Visit: Payer: Self-pay | Admitting: Physician Assistant

## 2012-08-20 ENCOUNTER — Observation Stay (HOSPITAL_COMMUNITY)
Admission: AD | Admit: 2012-08-20 | Discharge: 2012-08-20 | DRG: 287 | Disposition: A | Discharge status: Home / Self Care | Payer: Managed Care, Other (non HMO) | Source: Other Acute Inpatient Hospital | Attending: Cardiology | Admitting: Cardiology

## 2012-08-20 ENCOUNTER — Encounter (HOSPITAL_COMMUNITY)
Admission: AD | Disposition: A | Discharge status: Home / Self Care | Payer: Self-pay | Source: Other Acute Inpatient Hospital | Attending: Cardiology

## 2012-08-20 DIAGNOSIS — I200 Unstable angina: Secondary | ICD-10-CM

## 2012-08-20 DIAGNOSIS — I2510 Atherosclerotic heart disease of native coronary artery without angina pectoris: Principal | ICD-10-CM | POA: Diagnosis present

## 2012-08-20 DIAGNOSIS — R001 Bradycardia, unspecified: Secondary | ICD-10-CM | POA: Diagnosis present

## 2012-08-20 DIAGNOSIS — I440 Atrioventricular block, first degree: Secondary | ICD-10-CM | POA: Diagnosis present

## 2012-08-20 DIAGNOSIS — R9431 Abnormal electrocardiogram [ECG] [EKG]: Secondary | ICD-10-CM | POA: Diagnosis present

## 2012-08-20 DIAGNOSIS — I498 Other specified cardiac arrhythmias: Secondary | ICD-10-CM | POA: Diagnosis present

## 2012-08-20 DIAGNOSIS — R0789 Other chest pain: Secondary | ICD-10-CM | POA: Diagnosis present

## 2012-08-20 HISTORY — PX: LEFT HEART CATHETERIZATION WITH CORONARY ANGIOGRAM: SHX5451

## 2012-08-20 SURGERY — LEFT HEART CATHETERIZATION WITH CORONARY ANGIOGRAM
Anesthesia: LOCAL

## 2012-08-20 MED ORDER — LIDOCAINE HCL (PF) 1 % IJ SOLN
INTRAMUSCULAR | Status: AC
  Filled 2012-08-20: qty 30

## 2012-08-20 MED ORDER — VERAPAMIL HCL 2.5 MG/ML IV SOLN
INTRAVENOUS | Status: AC
  Filled 2012-08-20: qty 2

## 2012-08-20 MED ORDER — MIDAZOLAM HCL 2 MG/2ML IJ SOLN
INTRAMUSCULAR | Status: AC
  Filled 2012-08-20: qty 2

## 2012-08-20 MED ORDER — HEPARIN SODIUM (PORCINE) 1000 UNIT/ML IJ SOLN
INTRAMUSCULAR | Status: AC
  Filled 2012-08-20: qty 1

## 2012-08-20 MED ORDER — HEPARIN (PORCINE) IN NACL 2-0.9 UNIT/ML-% IJ SOLN
INTRAMUSCULAR | Status: AC
  Filled 2012-08-20: qty 1000

## 2012-08-20 NOTE — Interval H&P Note (Signed)
History and Physical Interval Note:  08/20/2012 1:10 PM  Brent Ray.  has presented today for surgery, with the diagnosis of cp  The various methods of treatment have been discussed with the patient and family. After consideration of risks, benefits and other options for treatment, the patient has consented to  Procedure(s): LEFT HEART CATHETERIZATION WITH CORONARY ANGIOGRAM (N/A) as a surgical intervention .  The patient's history has been reviewed, patient examined, no change in status, stable for surgery.  I have reviewed the patient's chart and labs.  Questions were answered to the patient's satisfaction.     [name]

## 2012-08-20 NOTE — H&P (Signed)
[facility]                                       [place], [place]  [address]       NAME:  Brent Ray, Brent Ray            ROOM:          191-47 UNIT NUMBER:  [id number]                        LOCATION:      77F ADM/VISIT DATE:     08/20/12                ADM PHYS:      Timmie Foerster MD ACCT: [id number]                               DOB:           Jan 27, 2046    CARDIOLOGY CONSULTATION REPORT    PRIMARY CARDIOLOGIST:  Hillis Range, M.D.    REFERRING PHYSICIAN:  Wende Crease, M.D., [facility] hospitalist.    REASON FOR CONSULTATION:  Chest pain, bradycardia, and EKG changes.    HISTORY OF PRESENT ILLNESS:  Mr. Navarrete is a very pleasant 67 year old male, with history of nonobstructive coronary artery disease, by a prior cardiac catheterization in 2005, in [place], [place].  More recently, he had a normal exercise stress Cardiolite study in our [facility], November of last year.  This was for evaluation of chest pain.  Of note, there was no documented chest pain during the stress test.    Immediately after lunch yesterday, patient developed significant anterior chest discomfort, referred to as a "pressure", 5/10 in intensity, with no associated symptoms.  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  An EMT on site took his blood pressure, reportedly 198/86, and he was directed to a walk-in clinic in [place] where he was told that he was possibly having an MI.  He was thus referred to the emergency room here, where he presented with a blood pressure of 164/128, pulse 90, and was treated with 4 baby aspirin, 1 inch nitro paste, 25 mg of Lopressor, and 40 mEq of potassium.    Of note, patient also had a similar episode 1 week ago, albeit of less intensity, which awakened at 2 a.m., and which spontaneous resolved after 15 minutes.    Admission EKG here indicated NSR 74 bpm with LAD and question  prior anteroseptal MI; less than 1 mm horizontal ST segment depression in leads V4-V6.  Followup EKG this morning indicates marked sinus bradycardia at 42 bpm with resolution of the ST segment changes in the lateral leads.    Serial cardiac markers are, thus far, within normal limits, with a 3rd troponin pending.    Patient is currently hemodynamically stable and pain-free.    Patient states that he has been having intermittent chest pain for approximately 1 year, but that yesterday's episode while at work was the worst. However, he also points out that he had even more severe chest pain near completion  of his stress test back in November, similar in location to yesterday's episode, but which also had associated bilateral arm discomfort.    The cardiac catheterization in 2005, in [place], [place], suggested 25% LAD stenosis with EF of 60%.    ALLERGIES:  No known drug allergies.    HOME MEDICATIONS:  None.    PAST MEDICAL HISTORY: 1.   Nonsustained ventricular tachycardia.           A.   Holter monitor, 2011.           B.   Recommended RF ablation, by Dr. Johney Frame. 2.   Symptomatic bradycardia.           A.   Acebutolol discontinued. 3.   Nonobstructive CAD.           A.   Cardiac catheterization, 2005, [place], [place]. 4.   Normal LV function. 5.   Nephrolithiasis. 6.   Syncopal episode, age 22.           A.   Reported normal cardiac catheterization. 7.   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    SURGICAL HISTORY:  Left leg operation.    SOCIAL HISTORY:  Patient lives in [place] with his wife.  He is a Data processing manager.  He has never smoked tobacco, and drinks occasional beer.    FAMILY HISTORY:  Negative for coronary artery disease.    REVIEW OF SYSTEMS:  Denies a formal diagnosis of HTN, diabetes mellitus, or dyslipidemia.  Otherwise as per HPI, remaining systems reviewed are negative. Patient denies any recent presyncope/syncope.    PHYSICAL EXAMINATION:  Vital  signs:  Blood pressure currently 151/80, pulse 50s, regular; temperature afebrile, sats 99% on 2L, weight 151 pounds. General:  A 67 year old male, well-nourished, well-developed, sitting upright in no distress.  HEENT:  Normocephalic, atraumatic.  Neck:  Palpable bilateral carotid pulses without bruits; no JVD.  Lungs:  Clear to auscultation all fields.  Heart:  Regular rhythm; no significant murmurs; no rubs or gallops. Abdomen:  Soft, intact bowel sounds.  Extremities:  Palpable bilateral femoral pulses without bruits; no peripheral edema.  Skin:  Warm and dry. Musculoskeletal:  No obvious deformity.  Neurologic:  Alert and oriented.    RADIOLOGIC STUDIES:  Admission chest x-ray:  No acute changes.    LABORATORY DATA:  Normal troponins (2); 3rd set pending.  INR 1.0.  D-dimer less than 0.27.  BNP 170.  Normal LFTs.  LDL 55.  Sodium 141, potassium 3.7,   BUN 17, creatinine 1.3 (GFR 58), and glucose 95.  CBC:  Normal.    IMPRESSION: 1.   Unstable angina pectoris.           A.   Normal troponins.           B.   Normal adequate graded exercise test Cardiolite, 01/2012.           C.   Nonobstructive coronary artery disease, 2005. 2.   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 3.   History of symptomatic bradycardia.           A.   Acebutolol discontinued. 4.   Chronic 1st-degree atrioventricular block.    PLAN:  Recommendation is to proceed with diagnostic coronary angiography for definitive exclusion of significant coronary artery disease.  Patient presents with progressive symptoms over the past year, culminating in a particularly severe episode while at work yesterday.  He also suggests experiencing chest discomfort during his most recent stress test, which was back in November.  He is currently hemodynamically stable and pain-free, and initial cardiac markers are within normal limits, with a 3rd troponin pending.  Of note, his admission EKG yesterday did  suggest some mild horizontal ST segment changes in the lateral leads, which resolved by followup EKG this morning.    The risks/benefits of the procedure were reviewed in conjunction with Dr. Diona Browner, and patient and his wife have agreed to proceed.    Patient seen and examined in conjunction with Dr. Diona Browner.  An extensive review was done of patient's prior office records.                                                ______________________________                                                       Prescott Parma, PA-C                                              ______________________________                                              Nona Dell, MD, [facility]   Attending note:  Patient seen and examined. Reviewed the case with Mr. Magnus Sinning. Mr. Nagy has been followed by Dr. Johney Frame with a history of frequent PVCs and NSVT, less clear history of atrial fibrillation. LVEF has been documented to be in the normal range, and he has a prior history of mild nonobstructive CAD by cardiac catheterization in [place] 2005. Myoview through our [place] office in November 2013 did not indicate active ischemia. He reports no recent exacerbation of palpitations, no dizziness or syncope. Previously intolerant to Acebutolol related to bradycardia.   He presents now describing progressive, recurrent episodes of chest pressure, typically with exertion. The episode prompting admission occurred after eating lunch, then hurrying to assist with something on his job site. Symptoms are resolved with nitroglycerin, cardiac markers are normal, ECG with fairly nonspecific but transient ST segment depression in the lateral leads.  Situation reviewed with the patient and his wife. In light of progressive symptoms, prior documented history of mild nonobstructive CAD in 2005, transient ST segment changes by ECG, plan will be transfer to [facility] for diagnostic cardiac catheterization. Need to clearly  exclude the possibility of progressive underlying CAD, despite reassuring noninvasive testing in November of last year. Risks and benefits were discussed, and he is in agreement to proceed.  Jonelle Sidle, M.D., F.A.C.C.

## 2012-08-20 NOTE — CV Procedure (Signed)
  Cardiac Catheterization Procedure Note  Name: Brent Ray. MRN: [id number] DOB: 1945-04-06  Procedure: Left Heart Cath, Selective Coronary Angiography, LV angiography  Indication:    Procedural details: The right radial was prepped, draped, and anesthetized with 1% lidocaine. Using modified Seldinger technique, a 5 French sheath was introduced into the right radial artery. Standard Judkins catheters were used for coronary angiography and left ventriculography. Catheter exchanges were performed over a guidewire. There were no immediate procedural complications. The patient was transferred to the post catheterization recovery area for further monitoring.  Procedural Findings:   Hemodynamics:     AO 137/70    LV 121/15   Coronary angiography:   Coronary dominance: Right  Left mainstem:   Normal  Left anterior descending (LAD):   Proximal severe calcification with focal long 50% stenosis.  Vessel is large and wraps the apex.  D1 proximal and arising in the calcified area.  This is a moderate sized vessel with ostial 50% stenosis.    Left circumflex (LCx):  AV groove mild luminal irregularities.  RI is small with ostial 30% stenosis.  MOM large with luminal irregularities.  Right coronary artery (RCA):  Large and dominant.  Proximal and mid long calcification.  Long proximal 30% stenosis.  PDA small/moderate normal.  Left ventriculography: Left ventricular systolic function is normal, LVEF is estimated at 65%, there is no significant mitral regurgitation   Final Conclusions:  Preserved EF.  Nonobstructive CAD.   Recommendations: The patient has a preserved EF and non obstructive CAD.  Given this, no further testing is indicated.  However, he does need aggressive risk reduction.  OK to discharge later today.   [name] 08/20/2012, 1:11 PM

## 2012-08-21 DIAGNOSIS — R001 Bradycardia, unspecified: Secondary | ICD-10-CM | POA: Diagnosis present

## 2012-08-21 DIAGNOSIS — R0789 Other chest pain: Secondary | ICD-10-CM | POA: Diagnosis present

## 2012-08-21 DIAGNOSIS — R9431 Abnormal electrocardiogram [ECG] [EKG]: Secondary | ICD-10-CM | POA: Diagnosis present

## 2012-08-21 NOTE — Discharge Summary (Signed)
   CARDIOLOGY DISCHARGE SUMMARY   Patient ID: Brent Ray. MRN: [id number] DOB/AGE: 1945/11/24 67 y.o.  Admit date: 08/20/2012 Discharge date: 08/21/2012  Primary Discharge Diagnosis:    Chest pain, mid sternal  Secondary Discharge Diagnosis:    Bradycardia   Abnormal ECG  Procedures:  Left Heart Cath, Selective Coronary Angiography, LV angiography   Hospital Course: Nathanel Tallman. is a 67 y.o. male with a history of non-obstructive CAD by remote cath in 2005 in [place]. He developed a sudden onset of anterior chest pain that was associated with hypertension. He was instructed to go to [facility] where he was evaluated by Dr. Diona Browner. His symptoms were concerning for cardiac chest pain and he was transferred to [facility] cone for further evaluation and cardiac catheterization.  The cardiac catheterization was performed with the full results below. He had no significant CAD and medical therapy was recommended. His EF was preserved. The films were evaluated by Dr. Antoine Poche who recommended aggressive risk factor reduction but no further inpatient workup was indicated. He was discharged home after his bedrest was complete. He was stable at the time of discharge. He is to followup at the [facility] office.    Radiology: No results found.  Cardiac Cath: 08/20/2012 Left mainstem: Normal  Left anterior descending (LAD): Proximal severe calcification with focal long 50% stenosis. Vessel is large and wraps the apex. D1 proximal and arising in the calcified area. This is a moderate sized vessel with ostial 50% stenosis.  Left circumflex (LCx): AV groove mild luminal irregularities. RI is small with ostial 30% stenosis. MOM large with luminal irregularities.  Right coronary artery (RCA): Large and dominant. Proximal and mid long calcification. Long proximal 30% stenosis. PDA small/moderate normal.  Left ventriculography: Left ventricular systolic function is normal, LVEF is estimated at  65%, there is no significant mitral regurgitation  Final Conclusions: Preserved EF. Nonobstructive CAD.   FOLLOW UP PLANS AND APPOINTMENTS No Known Allergies   Medication List    ASK your doctor about these medications       aspirin EC 81 MG tablet  Take 1 tablet (81 mg total) by mouth daily.     glucosamine-chondroitin 500-400 MG tablet  Take 1 tablet by mouth daily.     multivitamin tablet  Take 1 tablet by mouth daily.     nitroGLYCERIN 0.4 MG SL tablet  Commonly known as:  NITROSTAT  Place 1 tablet (0.4 mg total) under the tongue every 5 (five) minutes as needed for chest pain.     SM CRANBERRY 300 MG tablet  Generic drug:  Cranberry  Take 300 mg by mouth daily.     vitamin B-12 1000 MCG tablet  Commonly known as:  CYANOCOBALAMIN  Take 1,000 mcg by mouth daily.         Future Appointments Provider Department Dept Phone   09/05/2012 2:40 PM Prescott Parma, PA-C [facility] [place] [phone]       BRING ALL MEDICATIONS WITH YOU TO FOLLOW UP APPOINTMENTS  Time spent with patient to include physician time: 31 min Signed: Theodore Demark, PA-C 08/21/2012, 1:57 PM Co-Sign MD

## 2012-08-23 ENCOUNTER — Telehealth: Payer: Self-pay | Admitting: Internal Medicine

## 2012-08-23 NOTE — Telephone Encounter (Signed)
**Note De-Identified Brent Ray Obfuscation** Per Dr. Johney Frame pt may return to work on 6/18. Return to work letter faxed to [facility] @ [phone] and pt is aware.

## 2012-08-23 NOTE — Telephone Encounter (Signed)
New Prob    Requesting a release to go back to work post cath procedure. Fax number: [phone]

## 2012-08-23 NOTE — Telephone Encounter (Signed)
**Note De-identified Irish Piech Obfuscation** LMTCB

## 2012-09-05 ENCOUNTER — Ambulatory Visit (INDEPENDENT_AMBULATORY_CARE_PROVIDER_SITE_OTHER): Payer: Managed Care, Other (non HMO) | Admitting: Physician Assistant

## 2012-09-05 ENCOUNTER — Encounter: Payer: Self-pay | Admitting: Physician Assistant

## 2012-09-05 VITALS — BP 156/86 | HR 58 | Ht 68.0 in | Wt 152.4 lb

## 2012-09-05 DIAGNOSIS — I10 Essential (primary) hypertension: Secondary | ICD-10-CM | POA: Insufficient documentation

## 2012-09-05 DIAGNOSIS — I440 Atrioventricular block, first degree: Secondary | ICD-10-CM | POA: Insufficient documentation

## 2012-09-05 DIAGNOSIS — R001 Bradycardia, unspecified: Secondary | ICD-10-CM

## 2012-09-05 DIAGNOSIS — I2510 Atherosclerotic heart disease of native coronary artery without angina pectoris: Secondary | ICD-10-CM | POA: Insufficient documentation

## 2012-09-05 DIAGNOSIS — I498 Other specified cardiac arrhythmias: Secondary | ICD-10-CM

## 2012-09-05 DIAGNOSIS — E785 Hyperlipidemia, unspecified: Secondary | ICD-10-CM

## 2012-09-05 DIAGNOSIS — I472 Ventricular tachycardia: Secondary | ICD-10-CM | POA: Insufficient documentation

## 2012-09-05 DIAGNOSIS — Z79899 Other long term (current) drug therapy: Secondary | ICD-10-CM

## 2012-09-05 DIAGNOSIS — E782 Mixed hyperlipidemia: Secondary | ICD-10-CM | POA: Insufficient documentation

## 2012-09-05 MED ORDER — AMLODIPINE BESYLATE 5 MG PO TABS: 5.0000 mg | ORAL_TABLET | Freq: Every day | ORAL | Status: DC

## 2012-09-05 MED ORDER — PRAVASTATIN SODIUM 20 MG PO TABS: 20.0000 mg | ORAL_TABLET | Freq: Every evening | ORAL | Status: DC

## 2012-09-05 NOTE — Assessment & Plan Note (Signed)
Antihypertensive therapy initiated, as outlined above

## 2012-09-05 NOTE — Progress Notes (Signed)
Primary Cardiologist: Simona Huh, MD (new)   HPI: Post hospital followup from [facility], following transfer from [facility] for evaluation of UAP. Troponins NL, prior to transfer. Of note, he initially presented on no medications, with documented BP of 164/128 upon arrival.  He also presented with history of nonobstructive CAD, by prior cardiac catheterization in 2005, [place]. He also had a negative GXT Cardiolite in November 2013, in our [facility] office.   - Cardiac catheterization, June 10: Nonobstructive CAD; EF 65%  Aggressive risk factor modification was advised. LDL 55, prior to transfer.  Patient presents today reporting no further CP. He denies any complications of R wrist incision site.  No Known Allergies  Current Outpatient Prescriptions  Medication Sig Dispense Refill  . aspirin EC 81 MG tablet Take 1 tablet (81 mg total) by mouth daily.  90 tablet  3  . Cranberry (SM CRANBERRY) 300 MG tablet Take 300 mg by mouth daily.      [name] Kitchen glucosamine-chondroitin 500-400 MG tablet Take 1 tablet by mouth daily.      . Multiple Vitamin (MULTIVITAMIN) tablet Take 1 tablet by mouth daily.      . nitroGLYCERIN (NITROSTAT) 0.4 MG SL tablet Place 1 tablet (0.4 mg total) under the tongue every 5 (five) minutes as needed for chest pain.  25 tablet  5  . vitamin B-12 (CYANOCOBALAMIN) 1000 MCG tablet Take 1,000 mcg by mouth daily.      [name] Kitchen amLODipine (NORVASC) 5 MG tablet Take 1 tablet (5 mg total) by mouth daily.  180 tablet  3  . pravastatin (PRAVACHOL) 20 MG tablet Take 1 tablet (20 mg total) by mouth every evening.  90 tablet  3   No current facility-administered medications for this visit.    Past Medical History  Diagnosis Date  . CAD (coronary artery disease)     Nonobstructive; EF 65%, 08/2012  . HTN (hypertension)   . Sinus bradycardia     Symptomatic, acebutolol discontinued  . NSVT (nonsustained ventricular tachycardia)     Holter monitor, 2011; recommended RF ablation, by Dr.  Johney Frame  . Heart block AV first degree     Chronic  . HLD (hyperlipidemia)     Past Surgical History  Procedure Laterality Date  . Leg surgery  1979    left  . Hernia repair      2  . Kidney stone surgery      History   Social History  . Marital Status: Married    Spouse Name: N/A    Number of Children: 1  . Years of Education: N/A   Occupational History  . Quarry manager    Social History Main Topics  . Smoking status: Never Smoker   . Smokeless tobacco: Not on file  . Alcohol Use: Yes     Comment: occasional beer  . Drug Use: No  . Sexually Active: Not on file   Other Topics Concern  . Not on file   Social History Narrative  . No narrative on file    No family history on file.  ROS: no nausea, vomiting; no fever, chills; no melena, hematochezia; no claudication  PHYSICAL EXAM: BP 156/86  Pulse 58  Ht 5\' 8"  (1.727 m)  Wt 152 lb 6.4 oz (69.128 kg)  BMI 23.18 kg/m2 GENERAL: 68 year-old male; NAD HEENT: NCAT, PERRLA, EOMI; sclera clear; no xanthelasma NECK: palpable bilateral carotid pulses, no bruits; no JVD; no TM LUNGS: CTA bilaterally CARDIAC: RRR (S1, S2); no significant murmurs;  no rubs or gallops ABDOMEN: soft, non-tender; intact BS EXTREMETIES: intact R RP, no hematoma; no significant peripheral edema SKIN: warm/dry; no obvious rash/lesions MUSCULOSKELETAL: no joint deformity NEURO: no focal deficit; NL affect   EKG:    ASSESSMENT & PLAN:  CAD (coronary artery disease) Aggressive risk factor modification advised, including aggressive BP control. As noted earlier, patient was not on any anti-hypertensives at time of initial presentation. I recommended he start treatment with amlodipine 5 mg daily. He will check his BP over the next 2 weeks and contact our office with results. If BP still uncontrolled, I will increase amlodipine to 10 mg daily. Will also initiate low intensity statin therapy with Pravachol 20 mg daily, for plaque  stabilization. Patient had evidence of excellent lipid control prior to transfer (LDL 55), on no medical therapy. Will reassess lipid status in 12 weeks.  HYPERTENSION Antihypertensive therapy initiated, as outlined above  HLD (hyperlipidemia) Low intensity statin therapy initiated, as outlined above  Sinus bradycardia Stable, on no rate controlling agents    Brent Ray, PAC

## 2012-09-05 NOTE — Assessment & Plan Note (Signed)
Low intensity statin therapy initiated, as outlined above

## 2012-09-05 NOTE — Assessment & Plan Note (Signed)
Aggressive risk factor modification advised, including aggressive BP control. As noted earlier, patient was not on any anti-hypertensives at time of initial presentation. I recommended he start treatment with amlodipine 5 mg daily. He will check his BP over the next 2 weeks and contact our office with results. If BP still uncontrolled, I will increase amlodipine to 10 mg daily. Will also initiate low intensity statin therapy with Pravachol 20 mg daily, for plaque stabilization. Patient had evidence of excellent lipid control prior to transfer (LDL 55), on no medical therapy. Will reassess lipid status in 12 weeks.

## 2012-09-05 NOTE — Patient Instructions (Addendum)
Your physician recommends that you schedule a follow-up appointment in: 6 months. You will receive a reminder letter in the mail in about 4 months reminding you to call and schedule your appointment. If you don't receive this letter, please contact our office. Your physician has recommended you make the following change in your medication: Start amlodipine 5 mg daily. Start pravastatin 20 mg daily. Your new prescriptions have been sent to your pharmacy. All other medications will remain the same. Your physician recommends that you return for a FASTING lipid/liver profile: in 12 weeks around December 06, 2012. Please have this fasting lab work done at [facility]. Please return to our office in 2 weeks for a nurse BP check.

## 2012-09-05 NOTE — Assessment & Plan Note (Signed)
Stable, on no rate controlling agents

## 2012-09-23 ENCOUNTER — Encounter: Payer: Self-pay | Admitting: *Deleted

## 2012-09-23 ENCOUNTER — Ambulatory Visit (INDEPENDENT_AMBULATORY_CARE_PROVIDER_SITE_OTHER): Payer: Managed Care, Other (non HMO) | Admitting: *Deleted

## 2012-09-23 VITALS — BP 145/74 | HR 65 | Ht 68.0 in | Wt 151.0 lb

## 2012-09-23 DIAGNOSIS — I10 Essential (primary) hypertension: Secondary | ICD-10-CM

## 2012-09-23 NOTE — Progress Notes (Signed)
09/23/2012 - Per Gene Serpe, PA - much improved BP's, continue current medications.

## 2012-09-23 NOTE — Progress Notes (Signed)
Improved BP. Continue current medication regimen.

## 2012-09-23 NOTE — Progress Notes (Signed)
Patient left a list of blood pressure readings.  No dates associated with readings.    156/79  70 150/70  59 140/74  60 138/71  58 132/78  60 130/62  60 130/78  61 135/68  75 149/71  56

## 2012-09-23 NOTE — Progress Notes (Signed)
Wife notified.

## 2012-09-23 NOTE — Progress Notes (Signed)
Patient presents to office for follow up BP check due to recent change in medications at last office visit. Patient denies chest pain, dizziness or sob. Patient has taken all medications and doses without any side effects. Patient said he feels better.

## 2012-10-16 ENCOUNTER — Other Ambulatory Visit: Payer: Self-pay

## 2012-11-04 ENCOUNTER — Encounter: Payer: Self-pay | Admitting: Internal Medicine

## 2012-11-04 ENCOUNTER — Ambulatory Visit (INDEPENDENT_AMBULATORY_CARE_PROVIDER_SITE_OTHER): Payer: Managed Care, Other (non HMO) | Admitting: Internal Medicine

## 2012-11-04 VITALS — BP 147/75 | HR 60 | Ht 68.0 in | Wt 153.0 lb

## 2012-11-04 DIAGNOSIS — I2510 Atherosclerotic heart disease of native coronary artery without angina pectoris: Secondary | ICD-10-CM

## 2012-11-04 DIAGNOSIS — I10 Essential (primary) hypertension: Secondary | ICD-10-CM

## 2012-11-04 DIAGNOSIS — I4949 Other premature depolarization: Secondary | ICD-10-CM

## 2012-11-04 DIAGNOSIS — R001 Bradycardia, unspecified: Secondary | ICD-10-CM

## 2012-11-04 DIAGNOSIS — I498 Other specified cardiac arrhythmias: Secondary | ICD-10-CM

## 2012-11-04 NOTE — Progress Notes (Signed)
Primary Cardiologist:  Dr Brent Loth, MD is PCP  Brent Ray. is a 67 y.o. male who presents today for routine cardiology followup.  Since last visit with Brent Ray, the patient reports doing very well.  He denies SOB, CP, or symptoms of PVCS.  Today, he denies symptoms of palpitations,  shortness of breath,  lower extremity edema, dizziness, presyncope, or syncope.  The patient is otherwise without complaint today.   Past Medical History  Diagnosis Date  . CAD (coronary artery disease)     Nonobstructive; EF 65%, 08/2012  . HTN (hypertension)   . Sinus bradycardia     Symptomatic, acebutolol discontinued  . NSVT (nonsustained ventricular tachycardia)     Holter monitor, 2011; recommended RF ablation, by Dr. Johney Ray  . Heart block AV first degree     Chronic  . HLD (hyperlipidemia)    Past Surgical History  Procedure Laterality Date  . Leg surgery  1979    left  . Hernia repair      2  . Kidney stone surgery      Current Outpatient Prescriptions  Medication Sig Dispense Refill  . amLODipine (NORVASC) 5 MG tablet Take 1 tablet (5 mg total) by mouth daily.  180 tablet  3  . aspirin EC 81 MG tablet Take 1 tablet (81 mg total) by mouth daily.  90 tablet  3  . Cranberry (SM CRANBERRY) 300 MG tablet Take 300 mg by mouth daily.      . Multiple Vitamin (MULTIVITAMIN) tablet Take 1 tablet by mouth daily.      . nitroGLYCERIN (NITROSTAT) 0.4 MG SL tablet Place 1 tablet (0.4 mg total) under the tongue every 5 (five) minutes as needed for chest pain.  25 tablet  5  . NON FORMULARY Flex Sure - 2 tablet once daily      . pravastatin (PRAVACHOL) 20 MG tablet Take 1 tablet (20 mg total) by mouth every evening.  90 tablet  3  . vitamin B-12 (CYANOCOBALAMIN) 1000 MCG tablet Take 1,000 mcg by mouth daily.       No current facility-administered medications for this visit.    Physical Exam: Filed Vitals:   11/04/12 1442  BP: 147/75  Pulse: 60  Height: 5\' 8"  (1.727 m)  Weight:  153 lb (69.4 kg)    GEN- The patient is well appearing, alert and oriented x 3 today.   Head- normocephalic, atraumatic Eyes-  Sclera clear, conjunctiva pink Ears- hearing intact Oropharynx- clear Lungs- Clear to ausculation bilaterally, normal work of breathing Heart- Regular rate and rhythm, no murmurs, rubs or gallops, PMI not laterally displaced GI- soft, NT, ND, + BS Extremities- no clubbing, cyanosis, or edema  Records from 6/14 admit are reviewed including cath report Brent Ray notes are reviewed  Assessment and Plan:  1. HTN Improved Salt restriction advised Could consider increasing lasix if BP elevates further  2. Nonobstructive CAD He has not been taking pravachol due to nausea.  I have encouraged him to retry this medicine as his nausea has resolved  3. Pvcs/ bradycardia- resolved  Follow-up with Dr Brent Ray in 2 months I will see as needed going forward

## 2012-11-04 NOTE — Patient Instructions (Signed)
Continue all current medications. Follow up in  2 months - Dr. Diona Browner Follow up as needed with Dr. Johney Frame

## 2012-12-05 ENCOUNTER — Telehealth: Payer: Self-pay | Admitting: *Deleted

## 2012-12-05 NOTE — Telephone Encounter (Signed)
Message copied by Eustace Moore on Thu Dec 05, 2012  4:29 PM ------      Message from: MCDOWELL, Illene Bolus      Created: Thu Dec 05, 2012 10:06 AM       Patient last seen by Mr. Serpe PA-C for general cardiac followup. Lipids actually look fairly good, LDL 59. ------

## 2012-12-05 NOTE — Telephone Encounter (Signed)
Patient informed via message machine. 

## 2013-01-13 ENCOUNTER — Ambulatory Visit: Payer: Managed Care, Other (non HMO) | Admitting: Cardiology

## 2013-01-16 ENCOUNTER — Other Ambulatory Visit: Payer: Self-pay

## 2013-01-28 ENCOUNTER — Encounter: Payer: Self-pay | Admitting: Cardiology

## 2013-01-28 ENCOUNTER — Ambulatory Visit (INDEPENDENT_AMBULATORY_CARE_PROVIDER_SITE_OTHER): Payer: Managed Care, Other (non HMO) | Admitting: Cardiology

## 2013-01-28 VITALS — BP 141/65 | HR 60 | Ht 68.0 in | Wt 153.0 lb

## 2013-01-28 DIAGNOSIS — I4949 Other premature depolarization: Secondary | ICD-10-CM

## 2013-01-28 DIAGNOSIS — E782 Mixed hyperlipidemia: Secondary | ICD-10-CM

## 2013-01-28 DIAGNOSIS — I493 Ventricular premature depolarization: Secondary | ICD-10-CM

## 2013-01-28 DIAGNOSIS — I10 Essential (primary) hypertension: Secondary | ICD-10-CM

## 2013-01-28 DIAGNOSIS — I2510 Atherosclerotic heart disease of native coronary artery without angina pectoris: Secondary | ICD-10-CM

## 2013-01-28 MED ORDER — NITROGLYCERIN 0.4 MG SL SUBL: 0.4000 mg | SUBLINGUAL_TABLET | SUBLINGUAL | Status: DC | PRN

## 2013-01-28 NOTE — Assessment & Plan Note (Signed)
Lipids are well controlled as noted above, continue Pravachol.

## 2013-01-28 NOTE — Assessment & Plan Note (Signed)
Mild, nonobstructive disease at cardiac catheterization in June of this year. Asymptomatic. Continue aspirin and statin.

## 2013-01-28 NOTE — Assessment & Plan Note (Signed)
Reduced in frequency with satisfactory symptom control per patient. At this time he is on no specific medications to address this. Followup with Dr. Johney Frame is as needed.

## 2013-01-28 NOTE — Patient Instructions (Signed)
Your physician wants you to follow-up in:  6 months. You will receive a reminder letter in the mail two months in advance. If you don't receive a letter, please call our office to schedule the follow-up appointment.   

## 2013-01-28 NOTE — Assessment & Plan Note (Signed)
Blood pressure mildly elevated today. Keep followup with primary care. 

## 2013-01-28 NOTE — Progress Notes (Signed)
Clinical Summary Mr. Reczek is a 67 y.o.male presenting for followup, a former patient of Dr. Andee Lineman, also seen by Dr. Johney Frame. This is our first meeting. Record review finds history of mild nonobstructive CAD recently documented at cardiac catheterization in June, also symptomatic bradycardia and PVCs. He has previously been treated with bisoprolol, more recently acebutolol. There was discussion about possible PVC ablation, however this was never pursued in light of improved symptoms to ablation  Recent lab work showed normal LFTs, total cholesterol 155, triglycerides 126, HDL 71, LDL 59. He continues on Pravachol. We reviewed his labwork results.  He is here with his wife today. Continues to report satisfaction with symptoms, no progressive palpitations, no chest pain or syncope. He continues to work 12 days a month, 12 hour shifts. 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  No Known Allergies  Current Outpatient Prescriptions  Medication Sig Dispense Refill  . amLODipine (NORVASC) 5 MG tablet Take 1 tablet (5 mg total) by mouth daily.  180 tablet  3  . aspirin EC 81 MG tablet Take 1 tablet (81 mg total) by mouth daily.  90 tablet  3  . Cranberry (SM CRANBERRY) 300 MG tablet Take 300 mg by mouth daily.      . Multiple Vitamin (MULTIVITAMIN) tablet Take 1 tablet by mouth daily.      . NON FORMULARY Flex Sure - 2 tablet once daily      . pravastatin (PRAVACHOL) 20 MG tablet Take 1 tablet (20 mg total) by mouth every evening.  90 tablet  3  . vitamin B-12 (CYANOCOBALAMIN) 1000 MCG tablet Take 1,000 mcg by mouth daily.      . nitroGLYCERIN (NITROSTAT) 0.4 MG SL tablet Place 1 tablet (0.4 mg total) under the tongue every 5 (five) minutes as needed for chest pain.  25 tablet  5   No current facility-administered medications for this visit.    Past Medical History  Diagnosis Date  . Coronary atherosclerosis of native coronary artery     Nonobstructive; EF 65%, 08/2012  . Essential hypertension, benign    . Sinus bradycardia     Symptomatic, acebutolol discontinued  . NSVT (nonsustained ventricular tachycardia)     Holter monitor, 2011; recommended RF ablation by Dr. Johney Frame - never required  . Heart block AV first degree     Chronic  . Mixed hyperlipidemia     Social History Mr. Bellis reports that he has never smoked. He has never used smokeless tobacco. Mr. Messer reports that he drinks alcohol.  Review of Systems Some recent sinus congestion and sore throat the last 24 hours, no fevers or chills. Otherwise negative except as outlined.  Physical Examination Filed Vitals:   01/28/13 0844  BP: 141/65  Pulse: 60   Filed Weights   01/28/13 0844  Weight: 153 lb (69.4 kg)   Appears comfortable at rest. HEENT: Conjunctiva and lids normal. Neck: Supple, no elevated JVP or carotid bruits, no thyromegaly. Lungs: Clear to auscultation, nonlabored breathing at rest. Cardiac: Regular rate and rhythm, no S3 or significant systolic murmur, no pericardial rub. Abdomen: Soft, nontender, bowel sounds present. Extremities: No pitting edema, distal pulses 2+. Skin: Warm and dry. Musculoskeletal: No kyphosis. Neuropsychiatric: Alert and oriented x3, affect grossly appropriate.   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 At this time he is on no specific medications to address this. Followup with Dr. Johney Frame is as needed.  Coronary atherosclerosis of  native coronary artery Mild, nonobstructive disease at cardiac catheterization in June of this year. Asymptomatic. Continue aspirin and statin.  Essential hypertension, benign Blood pressure mildly elevated today. Keep followup with primary care.  Mixed hyperlipidemia Lipids are well controlled as noted above, continue Pravachol.    Jonelle Sidle, M.D., F.A.C.C.

## 2013-02-03 ENCOUNTER — Other Ambulatory Visit: Payer: Self-pay | Admitting: *Deleted

## 2013-02-03 MED ORDER — PRAVASTATIN SODIUM 20 MG PO TABS: 20.0000 mg | ORAL_TABLET | Freq: Every evening | ORAL | Status: DC

## 2013-02-03 MED ORDER — AMLODIPINE BESYLATE 5 MG PO TABS: 5.0000 mg | ORAL_TABLET | Freq: Every day | ORAL | Status: DC

## 2013-02-10 ENCOUNTER — Other Ambulatory Visit: Payer: Self-pay | Admitting: Cardiology

## 2013-02-10 MED ORDER — AMLODIPINE BESYLATE 5 MG PO TABS: 5.0000 mg | ORAL_TABLET | Freq: Every day | ORAL | Status: DC

## 2013-10-20 ENCOUNTER — Encounter: Payer: Self-pay | Admitting: Internal Medicine

## 2013-10-31 ENCOUNTER — Ambulatory Visit (INDEPENDENT_AMBULATORY_CARE_PROVIDER_SITE_OTHER): Payer: Managed Care, Other (non HMO) | Admitting: Nurse Practitioner

## 2013-10-31 ENCOUNTER — Encounter: Payer: Self-pay | Admitting: Nurse Practitioner

## 2013-10-31 VITALS — BP 136/80 | HR 64 | Ht 68.0 in | Wt 149.0 lb

## 2013-10-31 DIAGNOSIS — K648 Other hemorrhoids: Secondary | ICD-10-CM

## 2013-10-31 DIAGNOSIS — K219 Gastro-esophageal reflux disease without esophagitis: Secondary | ICD-10-CM | POA: Insufficient documentation

## 2013-10-31 MED ORDER — HYDROCORTISONE ACETATE 25 MG RE SUPP
RECTAL | Status: DC
Start: 2013-10-31 — End: 2014-09-08

## 2013-10-31 NOTE — Progress Notes (Addendum)
HPI :   Patient is a 68 year old male, new to this practice, here for evaluation of rectal bleeding. Patient had a colonoscopy in [place] October 2014. We have requested results the patient tells me the exam was normal. His provider network has changed and GI in [place] now out of network for patient.   Patient describes intermittent, painless rectal bleeding over the last several months. He denies constipation. I do not have labs from referring provider but patient tells me that his recent labs were all normal.  Patient was recently diagnosed with GERD. Protonix was started 2 weeks ago and he is feeling much better  Past Medical History  Diagnosis Date  . Coronary atherosclerosis of native coronary artery     Nonobstructive; EF 65%, 08/2012  . Essential hypertension, benign   . Sinus bradycardia     Symptomatic, acebutolol discontinued  . NSVT (nonsustained ventricular tachycardia)     Holter monitor, 2011; recommended RF ablation by [name] - never required  . Heart block AV first degree     Chronic  . Mixed hyperlipidemia     Family History  Problem Relation Age of Onset  . Colon cancer Mother    History  Substance Use Topics  . Smoking status: Never Smoker   . Smokeless tobacco: Never Used  . Alcohol Use: Yes     Comment: Occasional beer   Current Outpatient Prescriptions  Medication Sig Dispense Refill  . amLODipine (NORVASC) 5 MG tablet Take 1 tablet (5 mg total) by mouth daily.  90 tablet  3  . Cranberry-Vitamin C-Vitamin E 4200-20-3 MG-MG-UNIT CAPS Take by mouth as directed.      . Multiple Vitamin (MULTIVITAMIN) tablet Take 1 tablet by mouth daily.      . nitroGLYCERIN (NITROSTAT) 0.4 MG SL tablet Place 1 tablet (0.4 mg total) under the tongue every 5 (five) minutes as needed for chest pain.  25 tablet  5  . pantoprazole (PROTONIX) 40 MG tablet Take 40 mg by mouth daily.      . pravastatin (PRAVACHOL) 20 MG tablet Take 20 mg by mouth daily.       .  vitamin B-12 (CYANOCOBALAMIN) 1000 MCG tablet Take 1,000 mcg by mouth daily.       No current facility-administered medications for this visit.   No Known Allergies   Review of Systems: Positive for allergies, sinus trouble, anxiety and fatigue. All other systems reviewed and negative except where noted in HPI.   Physical Exam: BP 136/80  Pulse 64  Ht 5\' 8"  (1.727 m)  Wt 149 lb (67.586 kg)  BMI 22.66 kg/m2 Constitutional: Pleasant,well-developed, white male in no acute distress. HEENT: Normocephalic and atraumatic. Conjunctivae are normal. No scleral icterus. Neck supple.  Cardiovascular: Normal rate, regular rhythm.  Pulmonary/chest: Effort normal and breath sounds normal. No wheezing, rales or rhonchi. Abdominal: Soft, nondistended, nontender. Bowel sounds active throughout. There are no masses palpable. No hepatomegaly. Rectal: hemorrhoid tags, no masses felt on digital rectal exam. On anoscopy there were swollen, internal hemorrhoids Extremities: no edema Lymphadenopathy: No cervical adenopathy noted. Neurological: Alert and oriented to person place and time. Skin: Skin is warm and dry. No rashes noted. Psychiatric: Normal mood and affect. Behavior is normal.   ASSESSMENT AND PLAN:  14. 68 year old male with a several month history of intermittent, painless rectal bleeding with bowel movements. Awaiting colonoscopy from [place] which was done  October 2014. Exam was normal per patient. On anoscopy he has inflamed internal  hemorrhoids with are probably cause of bleeding. Will treat with steroid suppositories at night for 7 days. Avoid straining. Patient gets plenty of fiber in his diet, I recommended addition of a stool softener. Patient will call in a couple of weeks with a condition update. If hemorrhoids continue to bleed then we can talk about banding  2. GERD, newly diagnosed. Patient started Protonix 2 weeks ago and feeling much better. GERD literature given      Addendum 11/05/13:  Received records. Labs from PCP drawn 10/21/13. Hgb 15.1, MCV 97, TIBC 329, iron sat 34&, ferritin 34. Normal renal function, normal LFTs.   Colonoscopy by [name] done Oct 2014 was normal except for small rectal polyp (path = inflamed hyperplastic polyp).  EGD on same day for dysphagia revealed lower esoph ring dilated with Maloney bougie. Esoph biopsy normal.   Colonoscopy May 2011 for blood in stool. Normal except for hemorrhoids.   EGD May 2011 for GERD/ dysphagia showed distal esoph erosions and possible Barrett's. Barrett's on path. Will add Barrett's to PMH.

## 2013-10-31 NOTE — Patient Instructions (Addendum)
We have given you a GERD (Reflux) brochure.  We sent a prescription to [facility], [place], [place], [place]. Take stool softners, keep stools soft for now. Don't strain when having a bowel movement.  Call us in a few weeks if you are still bleeding.

## 2013-11-01 ENCOUNTER — Encounter: Payer: Self-pay | Admitting: Nurse Practitioner

## 2013-11-03 NOTE — Progress Notes (Signed)
Agree with Ms. Guenther's assessment and plan. Carl E. Gessner, MD, FACG   

## 2013-11-05 ENCOUNTER — Encounter: Payer: Self-pay | Admitting: Nurse Practitioner

## 2013-12-24 ENCOUNTER — Ambulatory Visit: Payer: Managed Care, Other (non HMO) | Admitting: Internal Medicine

## 2014-01-12 ENCOUNTER — Telehealth: Payer: Self-pay | Admitting: Cardiology

## 2014-01-12 ENCOUNTER — Other Ambulatory Visit: Payer: Self-pay | Admitting: Cardiology

## 2014-01-12 MED ORDER — AMLODIPINE BESYLATE 5 MG PO TABS: 5.0000 mg | ORAL_TABLET | Freq: Every day | ORAL | Status: DC

## 2014-01-12 NOTE — Telephone Encounter (Signed)
Please see refill bin / tgs  °

## 2014-01-19 ENCOUNTER — Telehealth: Payer: Self-pay | Admitting: Nurse Practitioner

## 2014-01-20 ENCOUNTER — Telehealth: Payer: Self-pay | Admitting: Nurse Practitioner

## 2014-01-20 ENCOUNTER — Other Ambulatory Visit: Payer: Self-pay | Admitting: *Deleted

## 2014-01-20 MED ORDER — PANTOPRAZOLE SODIUM 40 MG PO TBEC: DELAYED_RELEASE_TABLET | ORAL | Status: DC

## 2014-01-20 NOTE — Telephone Encounter (Signed)
I called Tye Savoy NP and she gave me permission to call in the Pantoprazole sodium 40 mg, # 30 with no refills to [facility] , [place], [place], # [phone]. Spoke to pharmacist. The patient wanted 30 days worth with no refills . He said he will need mail order prescription when he returns to [place].

## 2014-01-20 NOTE — Telephone Encounter (Signed)
Called in prescription for pantoprazole sodium 40 mg. Take 1 tab in the am daily.  # 30 with no refills. The patient will get mail order when he comes back to [place].  I called it into [place], [place], # [phone]. I spoke to pharmacist.  Tye Savoy NP approved this prescription.

## 2014-01-20 NOTE — Telephone Encounter (Signed)
Also called the patient and he asked to have me send rx twice daily for the pantoprazole sodium. [facility] requires a 90 day supply. I sent that today.

## 2014-01-27 ENCOUNTER — Encounter: Payer: Self-pay | Admitting: Cardiology

## 2014-02-19 ENCOUNTER — Encounter: Payer: Self-pay | Admitting: *Deleted

## 2014-02-19 ENCOUNTER — Ambulatory Visit (INDEPENDENT_AMBULATORY_CARE_PROVIDER_SITE_OTHER): Payer: Managed Care, Other (non HMO) | Admitting: Cardiology

## 2014-02-19 ENCOUNTER — Encounter: Payer: Self-pay | Admitting: Cardiology

## 2014-02-19 VITALS — BP 129/67 | HR 56 | Ht 68.0 in | Wt 153.0 lb

## 2014-02-19 DIAGNOSIS — I2510 Atherosclerotic heart disease of native coronary artery without angina pectoris: Secondary | ICD-10-CM

## 2014-02-19 NOTE — Assessment & Plan Note (Signed)
This has not been problematic recently. He has not required any specific rate control medications or acebutolol which he had been on previously, but were limited due to symptomatic bradycardia.

## 2014-02-19 NOTE — Assessment & Plan Note (Signed)
Blood pressure looks to be reasonably well controlled on Norvasc. No changes made today.

## 2014-02-19 NOTE — Assessment & Plan Note (Signed)
Continues on Pravachol, LDL last year was 59. Repeat lab work reassessed by [facility].

## 2014-02-19 NOTE — Progress Notes (Signed)
Reason for visit: CAD, history of PVCs and NSVT, angina  Clinical Summary Mr. Burgo is a 68 y.o.male last seen in November 2014. Recent records from [place] reviewed, patient saw Mr. Georgianne Fick in November. There is discussion in those records about patient history of atrial fibrillation, although this has not been a known arrhythmia for him based on my review of the chart. He has had symptomatic PVCs and NSVT previously, on acebutolol at one point, however not requiring long-term rate control medications due to bradycardia and adequate symptom control in general. He had seen [name] previously as well.  He does have CAD that has been managed medically. Cardiac catheterization in June 2014 showed mild to moderate nonobstructive CAD with normal LVEF. He had fairly extensive calcification of the LAD. He states that since the summer months he has been experiencing more regular angina symptoms with increased levels of activity. These always go away with rest. He has not used any nitroglycerin. No palpitations associated with the symptoms. He has not had ischemic testing since last year.  Recent ECG from November showed sinus rhythm with prolonged PR interval and left axis, decreased anterior R-wave progression.  Lipid panel from last year showed cholesterol 155, triglycerides 126, HDL 71, and LDL 59. He continues on Pravachol. He continues to follow lipids with [facility].   No Known Allergies  Current Outpatient Prescriptions  Medication Sig Dispense Refill  . amLODipine (NORVASC) 5 MG tablet Take 1 tablet (5 mg total) by mouth daily. 30 tablet 0  . Cranberry-Vitamin C-Vitamin E 4200-20-3 MG-MG-UNIT CAPS Take by mouth as directed.    . hydrocortisone (ANUSOL-HC) 25 MG suppository Use 1 suppository at bedtime. 7 suppository 1  . Multiple Vitamin (MULTIVITAMIN) tablet Take 1 tablet by mouth daily.    . pantoprazole (PROTONIX) 40 MG tablet Take 40 mg by mouth daily.    . pantoprazole  (PROTONIX) 40 MG tablet Take 1 tab twice daily 180 tablet 4  . pravastatin (PRAVACHOL) 20 MG tablet TAKE 1 TABLET EVERY EVENING 15 tablet 0  . vitamin B-12 (CYANOCOBALAMIN) 1000 MCG tablet Take 1,000 mcg by mouth daily.    . nitroGLYCERIN (NITROSTAT) 0.4 MG SL tablet Place 1 tablet (0.4 mg total) under the tongue every 5 (five) minutes as needed for chest pain. 25 tablet 5   No current facility-administered medications for this visit.    Past Medical History  Diagnosis Date  . Coronary atherosclerosis of native coronary artery     Nonobstructive; EF 65%, 08/2012  . Essential hypertension, benign   . Sinus bradycardia     Symptomatic, acebutolol discontinued  . NSVT (nonsustained ventricular tachycardia)     Holter monitor, 2011; recommended RF ablation by [name] - never required  . Heart block AV first degree     Chronic  . Mixed hyperlipidemia   . Barrett's esophagus 2011    In [place]. Records in [place] History Mr. Imburgia reports that he has never smoked. He has never used smokeless tobacco. Mr. Aultman reports that he drinks about 1.2 oz of alcohol per week.  Review of Systems Complete review of systems negative except as otherwise outlined in the clinical summary and also the following. No dizziness or syncope. Chronic arthritic complaints. States that he used to see a rheumatologist. Stable appetite. No major bleeding problems.  Physical Examination Filed Vitals:   02/19/14 0900  BP: 129/67  Pulse: 56   Filed Weights   02/19/14 0900  Weight: 153 lb (  69.4 kg)    Appears comfortable at rest. HEENT: Conjunctiva and lids normal. Neck: Supple, no elevated JVP or carotid bruits, no thyromegaly. Lungs: Clear to auscultation, nonlabored breathing at rest. Cardiac: Regular rate and rhythm, no S3 or significant systolic murmur, no pericardial rub. Abdomen: Soft, nontender, bowel sounds present. Extremities: No pitting edema, distal pulses 2+. Skin: Warm and  dry. Musculoskeletal: No kyphosis. Neuropsychiatric: Alert and oriented x3, affect grossly appropriate.   Problem List and Plan   Coronary atherosclerosis of native coronary artery Mild to moderate disease at catheterization last year with fairly extensive calcification of the LAD. He is reporting increased exertional angina symptoms since the summer months. He continues on Norvasc and Pravachol. No beta blocker with history of symptomatic bradycardia. Plan is to proceed with an exercise Cardiolite for repeat ischemic assessment to further risk stratify.  Symptomatic PVCs This has not been problematic recently. He has not required any specific rate control medications or acebutolol which he had been on previously, but were limited due to symptomatic bradycardia.  Mixed hyperlipidemia Continues on Pravachol, LDL last year was 59. Repeat lab work reassessed by [facility].  Essential hypertension, benign Blood pressure looks to be reasonably well controlled on Norvasc. No changes made today.    Satira Sark, M.D., F.A.C.C.

## 2014-02-19 NOTE — Assessment & Plan Note (Signed)
Mild to moderate disease at catheterization last year with fairly extensive calcification of the LAD. He is reporting increased exertional angina symptoms since the summer months. He continues on Norvasc and Pravachol. No beta blocker with history of symptomatic bradycardia. Plan is to proceed with an exercise Cardiolite for repeat ischemic assessment to further risk stratify.

## 2014-02-19 NOTE — Patient Instructions (Signed)
Your physician recommends that you schedule a follow-up appointment in: 1 year. You will receive a reminder letter in the mail in about 10 months reminding you to call and schedule your appointment. If you don't receive this letter, please contact our office. Your physician recommends that you continue on your current medications as directed. Please refer to the Current Medication list given to you today. Your physician has requested that you have en exercise stress myoview. For further information please visit [email or web address]. Please follow instruction sheet, as given.

## 2014-02-27 ENCOUNTER — Ambulatory Visit (HOSPITAL_COMMUNITY)
Admission: RE | Admit: 2014-02-27 | Discharge: 2014-02-27 | Disposition: A | Discharge status: Home / Self Care | Payer: Managed Care, Other (non HMO) | Source: Ambulatory Visit | Attending: Cardiology | Admitting: Cardiology

## 2014-02-27 ENCOUNTER — Encounter (HOSPITAL_COMMUNITY): Payer: Self-pay

## 2014-02-27 ENCOUNTER — Encounter (HOSPITAL_COMMUNITY)
Admission: RE | Admit: 2014-02-27 | Discharge: 2014-02-27 | Disposition: A | Discharge status: Home / Self Care | Payer: Managed Care, Other (non HMO) | Source: Ambulatory Visit | Attending: Cardiology | Admitting: Cardiology

## 2014-02-27 DIAGNOSIS — R079 Chest pain, unspecified: Secondary | ICD-10-CM | POA: Insufficient documentation

## 2014-02-27 DIAGNOSIS — I2510 Atherosclerotic heart disease of native coronary artery without angina pectoris: Secondary | ICD-10-CM

## 2014-02-27 DIAGNOSIS — R5383 Other fatigue: Secondary | ICD-10-CM | POA: Insufficient documentation

## 2014-02-27 DIAGNOSIS — R0602 Shortness of breath: Secondary | ICD-10-CM | POA: Insufficient documentation

## 2014-02-27 DIAGNOSIS — I25119 Atherosclerotic heart disease of native coronary artery with unspecified angina pectoris: Secondary | ICD-10-CM | POA: Diagnosis present

## 2014-02-27 DIAGNOSIS — I493 Ventricular premature depolarization: Secondary | ICD-10-CM | POA: Insufficient documentation

## 2014-02-27 MED ORDER — SODIUM CHLORIDE 0.9 % IJ SOLN
INTRAMUSCULAR | Status: AC
  Administered 2014-02-27: 10 mL via INTRAVENOUS
  Filled 2014-02-27: qty 10

## 2014-02-27 MED ORDER — SODIUM CHLORIDE 0.9 % IJ SOLN
10.0000 mL | INTRAMUSCULAR | Status: DC | PRN
  Administered 2014-02-27: 10 mL via INTRAVENOUS
  Filled 2014-02-27: qty 10

## 2014-02-27 MED ORDER — REGADENOSON 0.4 MG/5ML IV SOLN
INTRAVENOUS | Status: AC
  Filled 2014-02-27: qty 5

## 2014-02-27 MED ORDER — TECHNETIUM TC 99M SESTAMIBI - CARDIOLITE
30.0000 | Freq: Once | INTRAVENOUS | Status: AC | PRN
  Administered 2014-02-27: 30 via INTRAVENOUS

## 2014-02-27 MED ORDER — TECHNETIUM TC 99M SESTAMIBI GENERIC - CARDIOLITE
10.0000 | Freq: Once | INTRAVENOUS | Status: AC | PRN
  Administered 2014-02-27: 10 via INTRAVENOUS

## 2014-02-27 NOTE — Progress Notes (Signed)
Stress Lab Nurses Notes - Brent Na  Brent Ray. 02/27/2014 Reason for doing test: CAD, PVC & angina Type of test: Stress Cardiolite Nurse performing test: Brent Halls, RN Nuclear Medicine Tech: Redmond Baseman Echo Tech: Not Applicable MD performing test: Gaynelle Cage MD: Georgianne Fick Test explained and consent signed: Yes.   IV started: Saline lock flushed, No redness or edema and Saline lock started in radiology Symptoms: SOB  & Chest Pain Treatment/Intervention: None Reason test stopped: fatigue and SOB After recovery IV was: Discontinued via X-ray tech and No redness or edema Patient to return to Nuc. Med at : 12:30 Patient discharged: Home Patient's Condition upon discharge was: stable Comments: During test Peak BP 183/75  & HR 125.  Recovery BP 165/70 & HR 65.  Symptoms resolved in recovery.  Geanie Cooley T

## 2014-03-03 ENCOUNTER — Telehealth: Payer: Self-pay | Admitting: *Deleted

## 2014-03-03 MED ORDER — ISOSORBIDE MONONITRATE ER 30 MG PO TB24: 15.0000 mg | ORAL_TABLET | Freq: Every day | ORAL | Status: DC

## 2014-03-03 NOTE — Telephone Encounter (Signed)
Patient's wife and son informed. Rx sent to [facility] in [place].

## 2014-03-03 NOTE — Telephone Encounter (Signed)
-----   Message from Satira Sark, MD sent at 03/02/2014  7:38 AM EST ----- Reviewed report. No focal ischemia is reported to suggest definite progression to obstructive CAD. He still could be having angina symptoms. Might consider trying to add Imdur 15 mg daily in addition to his Norvasc. If this is not effective, or his symptoms progress, cardiac catheterization can always be discussed.

## 2014-05-10 ENCOUNTER — Other Ambulatory Visit: Payer: Self-pay | Admitting: Cardiology

## 2014-09-08 ENCOUNTER — Encounter: Payer: Self-pay | Admitting: Cardiology

## 2014-09-08 ENCOUNTER — Telehealth: Payer: Self-pay | Admitting: Cardiology

## 2014-09-08 ENCOUNTER — Ambulatory Visit (INDEPENDENT_AMBULATORY_CARE_PROVIDER_SITE_OTHER): Payer: Managed Care, Other (non HMO) | Admitting: Cardiology

## 2014-09-08 VITALS — BP 138/70 | HR 69 | Ht 68.0 in | Wt 149.0 lb

## 2014-09-08 DIAGNOSIS — I480 Paroxysmal atrial fibrillation: Secondary | ICD-10-CM | POA: Diagnosis not present

## 2014-09-08 DIAGNOSIS — I10 Essential (primary) hypertension: Secondary | ICD-10-CM | POA: Diagnosis not present

## 2014-09-08 DIAGNOSIS — I25119 Atherosclerotic heart disease of native coronary artery with unspecified angina pectoris: Secondary | ICD-10-CM | POA: Diagnosis not present

## 2014-09-08 NOTE — Progress Notes (Signed)
Cardiology Office Note  Date: 09/08/2014   ID: Brent Ray., DOB [birth year], MRN [id number]  PCP: Jalene Mullet, PA-C  Primary Cardiologist: Rozann Lesches, MD   Chief Complaint  Patient presents with  . Hospitalization Follow-up    History of Present Illness: Brent Ray. is a 69 y.o. male last seen in December 2015. Record review finds recent hospitalization at [facility] in June with chest pain and hypertensive urgency. He ruled out for ACS by cardiac enzymes and underwent a cardiac catheterization on June 12 by Dr. Cyndie Mull demonstrating 40% ostial first diagonal stenosis, 30% circumflex stenosis, and 20% RCA stenosis. He was noted to have evidence of rate-controlled paroxysmal atrial fibrillation during his day as well, although not clearly symptomatic. Most recent lab work and echocardiogram results are outlined below.  He is here today with his son. Medications are outlined below, his antihypertensive regimen has been modified, and review of his home blood pressure checks shows an improving trend. He does not endorse any further chest pain. We discussed the results of his cardiac catheterization, overall reassuring. We also discussed his diagnosis of PAF as it relates to stroke risk.  CHADSVASC score is 3 at this point. Annual risk of stroke on aspirin would be 3.4% versus 1.1% on Eliquis. Heart rate is regular today, he does not endorse any palpitations. Interestingly, he does tell me that he has had at least 3 episodes of the last year of feeling suddenly dizzy. There has been question of potential sick sinus syndrome based on cinematic bradycardia on beta blocker in the past. He has a significant only prolonged PR interval by ECG in sinus rhythm.  He states that he has been out with FMLA papers as per his primary care provider. Son states that this is been related to cervical pain and he may be undergoing physical therapy. At this point there are  no specific cardiac limitations to his typical activities. We did discuss providing an event recorder to see if we can capture more readily the frequency of PAF, and also exclude any potential bradycardia arrhythmias or pauses.   Past Medical History  Diagnosis Date  . Coronary atherosclerosis of native coronary artery     Nonobstructive  . Essential hypertension, benign   . Sinus bradycardia     Symptomatic, acebutolol discontinued  . NSVT (nonsustained ventricular tachycardia)     Holter monitor, 2011; recommended RF ablation by [name] - never required  . Heart block AV first degree     Chronic  . Mixed hyperlipidemia   . Barrett's esophagus 2011    In [place]. Records in [place]  . PAF (paroxysmal atrial fibrillation)     Recently documented in [facility] June 2016    Past Surgical History  Procedure Laterality Date  . Left leg surgery  1979  . Hernia repair      2  . Kidney stone surgery    . Colonoscopy  2014    [place]--[name]  . Esophagogastroduodenoscopy  2014    [place]--[name]  . Left heart catheterization with coronary angiogram N/A 08/20/2012    Procedure: LEFT HEART CATHETERIZATION WITH CORONARY ANGIOGRAM;  Surgeon: [name], MD;  Location: [facility] CATH LAB;  Service: Cardiovascular;  Laterality: N/A;    Current Outpatient Prescriptions  Medication Sig Dispense Refill  . Cranberry-Vitamin C-Vitamin E 4200-20-3 MG-MG-UNIT CAPS Take by mouth as directed.    [name] Kitchen lisinopril-hydrochlorothiazide (PRINZIDE,ZESTORETIC) 20-25 MG per tablet Take 1  tablet by mouth daily.    . Multiple Vitamin (MULTIVITAMIN) tablet Take 1 tablet by mouth daily.    [name] Kitchen NITROSTAT 0.4 MG SL tablet DISSOLVE 1 TABLET UNDER TONGUE EVERY 5 MINUTES AS NEEDED FOR CHEST PAIN 25 tablet 3  . pravastatin (PRAVACHOL) 20 MG tablet TAKE 1 TABLET EVERY EVENING 15 tablet 0  . vitamin B-12 (CYANOCOBALAMIN) 1000 MCG tablet Take 1,000 mcg by mouth daily.    . pantoprazole (PROTONIX) 40 MG  tablet Take 40 mg by mouth daily.     No current facility-administered medications for this visit.    Allergies:  Review of patient's allergies indicates no known allergies.   Social History: The patient  reports that he has never smoked. He has never used smokeless tobacco. He reports that he drinks about 1.2 oz of alcohol per week. He reports that he does not use illicit drugs.   ROS:  Please see the history of present illness. Otherwise, complete review of systems is positive for neck pain.  All other systems are reviewed and negative.   Physical Exam: VS:  BP 138/70 mmHg  Pulse 69  Ht 5\' 8"  (1.727 m)  Wt 149 lb (67.586 kg)  BMI 22.66 kg/m2  SpO2 96%, BMI Body mass index is 22.66 kg/(m^2).  Wt Readings from Last 3 Encounters:  09/08/14 149 lb (67.586 kg)  02/19/14 153 lb (69.4 kg)  10/31/13 149 lb (67.586 kg)     Appears comfortable at rest. HEENT: Conjunctiva and lids normal. Neck: Supple, no elevated JVP or carotid bruits, no thyromegaly. Lungs: Clear to auscultation, nonlabored breathing at rest. Cardiac: Regular rate and rhythm, no S3 or significant systolic murmur, no pericardial rub. Abdomen: Soft, nontender, bowel sounds present. Extremities: No pitting edema, distal pulses 2+. Skin: Warm and dry. Musculoskeletal: No kyphosis. Neuropsychiatric: Alert and oriented x3, affect grossly appropriate.   ECG: Tracing from 08/24/2014 showed sinus bradycardia at 47 bpm with prolonged PR interval of 316 ms. Tracing from 02/16/11/2016 showed probable atrial fibrillation at 67 bpm.  Recent Labwork:  08/24/2014: Hemoglobin 13.3, platelets 263, BUN 9, creatinine 1.1, potassium 4.1, AST 16, ALT 22, BNP 276, magnesium 2.1, cholesterol 136, triglycerides 72, HDL 63, LDL 59.  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  ASSESSMENT AND PLAN:  1. Nonobstructive CAD recently reassessed at cardiac catheterization in [place] as outlined above. Would recommend basic risk factor modification. He is also on statin therapy with recent LDL 59.  2. Paroxysmal atrial fibrillation, recently documented during his hospital stay in [place]. He is in sinus rhythm on examination today. CHADSVASC score is 3 as outlined above, we did discuss increased risk of stroke, and also rationale for using anticoagulant treatment instead of aspirin. For now he elected to wait on starting any anticoagulation and would like to consider the matter further. We discussed obtaining a 30 day event recorder to further document frequency of atrial fibrillation, and also assess for any potential symptomatic sick sinus syndrome.  3. Essential hypertension, blood pressure trend has been better, currently on Prinzide. Norvasc has been discontinued.  Current medicines were reviewed at length with the patient today.  Disposition: FU with me in 4-6 weeks.   Signed, Satira Sark, MD, [facility] 09/08/2014 1:58 PM    [place] at [facility] [address] [place], [place] [address] Phone: [phone]; Fax: [phone]

## 2014-09-08 NOTE — Telephone Encounter (Signed)
The serial number on the monitor this patient rec'd is [id number]  No callback requested unless you need more information.

## 2014-09-08 NOTE — Patient Instructions (Signed)
Your physician recommends that you schedule a follow-up appointment in:  4-6 weeks with Dr. Domenic Polite.    Your physician recommends that you continue on your current medications as directed. Please refer to the Current Medication list given to you today.  Your physician has recommended that you wear an event monitor. Event monitors are medical devices that record the heart's electrical activity. Doctors most often [place] these monitors to diagnose arrhythmias. Arrhythmias are problems with the speed or rhythm of the heartbeat. The monitor is a small, portable device. You can wear one while you do your normal daily activities. This is usually used to diagnose what is causing palpitations/syncope (passing out).   Thank you for choosing [facility]!

## 2014-09-09 ENCOUNTER — Encounter (INDEPENDENT_AMBULATORY_CARE_PROVIDER_SITE_OTHER): Payer: Managed Care, Other (non HMO)

## 2014-09-09 DIAGNOSIS — I480 Paroxysmal atrial fibrillation: Secondary | ICD-10-CM

## 2014-09-09 NOTE — Telephone Encounter (Signed)
Noted  

## 2014-09-15 ENCOUNTER — Ambulatory Visit: Payer: Managed Care, Other (non HMO) | Admitting: Cardiology

## 2014-10-13 ENCOUNTER — Encounter: Payer: Self-pay | Admitting: Cardiology

## 2014-10-13 ENCOUNTER — Ambulatory Visit (INDEPENDENT_AMBULATORY_CARE_PROVIDER_SITE_OTHER): Payer: Managed Care, Other (non HMO) | Admitting: Cardiology

## 2014-10-13 VITALS — BP 102/62 | HR 52 | Ht 68.0 in | Wt 145.0 lb

## 2014-10-13 DIAGNOSIS — I495 Sick sinus syndrome: Secondary | ICD-10-CM | POA: Diagnosis not present

## 2014-10-13 DIAGNOSIS — I25119 Atherosclerotic heart disease of native coronary artery with unspecified angina pectoris: Secondary | ICD-10-CM

## 2014-10-13 DIAGNOSIS — I480 Paroxysmal atrial fibrillation: Secondary | ICD-10-CM

## 2014-10-13 DIAGNOSIS — I2510 Atherosclerotic heart disease of native coronary artery without angina pectoris: Secondary | ICD-10-CM

## 2014-10-13 DIAGNOSIS — R001 Bradycardia, unspecified: Secondary | ICD-10-CM | POA: Diagnosis not present

## 2014-10-13 NOTE — Patient Instructions (Signed)
   Follow up with [name] for symptomatic bradycardia & paroxysmal atrial fibrillation. Continue all current medications. Follow up in  3 months

## 2014-10-13 NOTE — Progress Notes (Signed)
Cardiology Office Note  Date: 10/13/2014   ID: Brent Ray., DOB [birth year], MRN [id number]  PCP: Jalene Mullet, PA-C  Primary Cardiologist: Rozann Lesches, MD   Chief Complaint  Patient presents with  . PAF  . Coronary Artery Disease    History of Present Illness: Geoffrey Hynes. is a 69 y.o. male last seen in June. He returns for a follow-up visit to review symptoms and cardiac monitor. He continues to report fatigue, episodic dizziness and lightheadedness. Wakes up at nighttime sometimes with a forceful feeling heartbeat and diaphoresis. He has had no frank syncope or chest pain.  Recent cardiac event monitor reviewed. Predominant rhythm was sinus with prolonged PR interval and evidence of significant bradycardia down into the 30s and 40s at times. Episodes also suggestive of junctional rhythm, rule out transient 2:1 heart block as well. Limited episode of slow atrial fibrillation also noted as well as occasional PVCs. He did not have any pauses but findings are consistent with sick sinus syndrome.  CHADSVASC score is 3. We have discussed anticoagulation for stroke prophylaxis, he continues to consider the matter. Not yet ready to start a medication however.  Past Medical History  Diagnosis Date  . Coronary atherosclerosis of native coronary artery     Nonobstructive  . Essential hypertension, benign   . Sinus bradycardia     Symptomatic, acebutolol discontinued  . NSVT (nonsustained ventricular tachycardia)     Holter monitor, 2011; recommended RF ablation by [name] - never required  . Heart block AV first degree     Chronic  . Mixed hyperlipidemia   . Barrett's esophagus 2011    In [place]. Records in [place]  . PAF (paroxysmal atrial fibrillation)     Recently documented in [facility] June 2016    Past Surgical History  Procedure Laterality Date  . Left leg surgery  1979  . Hernia repair      2  . Kidney stone surgery    . Colonoscopy  2014   [place]--[name]  . Esophagogastroduodenoscopy  2014    [place][name]  . Left heart catheterization with coronary angiogram N/A 08/20/2012    Procedure: LEFT HEART CATHETERIZATION WITH CORONARY ANGIOGRAM;  Surgeon: [name], MD;  Location: [facility] CATH LAB;  Service: Cardiovascular;  Laterality: N/A;    Current Outpatient Prescriptions  Medication Sig Dispense Refill  . Cranberry-Vitamin C-Vitamin E 4200-20-3 MG-MG-UNIT CAPS Take by mouth as directed.    [name] Kitchen lisinopril-hydrochlorothiazide (PRINZIDE,ZESTORETIC) 20-25 MG per tablet Take 1 tablet by mouth daily.    . Multiple Vitamin (MULTIVITAMIN) tablet Take 1 tablet by mouth daily.    [name] Kitchen NITROSTAT 0.4 MG SL tablet DISSOLVE 1 TABLET UNDER TONGUE EVERY 5 MINUTES AS NEEDED FOR CHEST PAIN 25 tablet 3  . pantoprazole (PROTONIX) 40 MG tablet Take 40 mg by mouth as needed.     . pravastatin (PRAVACHOL) 20 MG tablet TAKE 1 TABLET EVERY EVENING 15 tablet 0  . vitamin B-12 (CYANOCOBALAMIN) 1000 MCG tablet Take 1,000 mcg by mouth daily.     No current facility-administered medications for this visit.    Allergies:  Review of patient's allergies indicates no known allergies.   Social History: The patient  reports that he has never smoked. He has never used smokeless tobacco. He reports that he drinks about 1.2 oz of alcohol per week. He reports that he does not use illicit drugs.   ROS:  Please see the history of present illness. Otherwise,  complete review of systems is positive for none.  All other systems are reviewed and negative.   Physical Exam: VS:  BP 102/62 mmHg  Pulse 52  Ht 5\' 8"  (1.727 m)  Wt 145 lb (65.772 kg)  BMI 22.05 kg/m2  SpO2 98%, BMI Body mass index is 22.05 kg/(m^2).  Wt Readings from Last 3 Encounters:  10/13/14 145 lb (65.772 kg)  09/08/14 149 lb (67.586 kg)  02/19/14 153 lb (69.4 kg)     Appears comfortable at rest. HEENT: Conjunctiva and lids normal. Neck: Supple, no elevated JVP or carotid  bruits, no thyromegaly. Lungs: Clear to auscultation, nonlabored breathing at rest. Cardiac: Regular rate and rhythm, no S3 or significant systolic murmur, no pericardial rub. Abdomen: Soft, nontender, bowel sounds present. Extremities: No pitting edema, distal pulses 2+. Skin: Warm and dry. Musculoskeletal: No kyphosis. Neuropsychiatric: Alert and oriented x3, affect grossly appropriate.   ECG: ECG is not ordered today.   Recent Labwork:  June 2016: Hemoglobin 13.3, platelets 263, INR 1.1, BUN 9, creatinine 1.1, potassium 4.2, AST 16, ALT 22.  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  Cardiac catheterization on 6/122016 by Dr. Cyndie Mull in [place] demonstrated 40% ostial first diagonal stenosis, 30% circumflex stenosis, and 20% RCA stenosis  Assessment and Plan:  1. Suspected symptomatic bradycardia based on recurring symptoms of weakness and lightheadedness, documented significant bradycardia intermittently and additional concerns for conduction system disease as outlined above. He has had trouble with significant bradycardia on the past on beta blocker, currently not on any heart rate lowering agents. He is being referred back to see [name] who has evaluated him in the past for discussion about whether device treatment should be considered.  2. Paroxysmal atrial fibrillation. Documented in June, transient episode on recent monitor as well. No rapid rates with underlying conduction system disease. We have discussed anticoagulation with CHADSVASC score 3, he is considering the matter.  3. Mild, nonobstructive CAD documented at recent cardiac catheterization in June in [place].  Current medicines were reviewed with the patient today.  Disposition: FU with men 3 months.   Signed, Satira Sark, MD, [facility] 10/13/2014  3:19 PM    [place] at [place], [place], [place] [address] Phone: [phone]; Fax: [phone]

## 2014-10-14 NOTE — Addendum Note (Signed)
Addended by: Julian Hy T on: 10/14/2014 10:10 AM   Modules accepted: Orders

## 2014-10-28 ENCOUNTER — Ambulatory Visit: Payer: Managed Care, Other (non HMO) | Admitting: Cardiology

## 2014-11-02 ENCOUNTER — Ambulatory Visit: Payer: Managed Care, Other (non HMO) | Admitting: Adult Health

## 2014-11-09 ENCOUNTER — Ambulatory Visit (INDEPENDENT_AMBULATORY_CARE_PROVIDER_SITE_OTHER): Payer: Managed Care, Other (non HMO) | Admitting: Internal Medicine

## 2014-11-09 ENCOUNTER — Encounter: Payer: Self-pay | Admitting: Internal Medicine

## 2014-11-09 VITALS — BP 130/60 | HR 62 | Ht 68.0 in | Wt 144.8 lb

## 2014-11-09 DIAGNOSIS — R5383 Other fatigue: Secondary | ICD-10-CM | POA: Diagnosis not present

## 2014-11-09 DIAGNOSIS — R0683 Snoring: Secondary | ICD-10-CM

## 2014-11-09 DIAGNOSIS — R5382 Chronic fatigue, unspecified: Secondary | ICD-10-CM | POA: Diagnosis not present

## 2014-11-09 DIAGNOSIS — R001 Bradycardia, unspecified: Secondary | ICD-10-CM

## 2014-11-09 DIAGNOSIS — I2510 Atherosclerotic heart disease of native coronary artery without angina pectoris: Secondary | ICD-10-CM

## 2014-11-09 NOTE — Patient Instructions (Signed)
Medication Instructions:  Your physician recommends that you continue on your current medications as directed. Please refer to the Current Medication list given to you today.   Labwork: None ordered  Testing/Procedures: Your physician has recommended that you have a sleep study. This test records several body functions during sleep, including: brain activity, eye movement, oxygen and carbon dioxide blood levels, heart rate and rhythm, breathing rate and rhythm, the flow of air through your mouth and nose, snoring, body muscle movements, and chest and belly movement.    Follow-Up: Your physician recommends that you schedule a follow-up appointment in: 6 weeks with Dr [name]   Any Other Special Instructions Will Be Listed Below (If Applicable).

## 2014-11-09 NOTE — Progress Notes (Signed)
Primary Cardiologist:  Dr Trena Platt, Amy Lemmie Evens, PA-C is PCP  [name]. is a 69 y.o. male who presents today for EP followup.  I have not seen him since 2014.  He has multiple chronic issues including fatigue, dizziness and atypical chest pain.  Recently he was evaluated in [place] (Records reviewed).  He had cath revealing nonobstructive cad.  Echo revealed preserved EF and he had moderate MR without LV dilatation.  His atypical chest pain was attributed to htn.  He was also noted to have frequent sinus bradycardia and a discussion about possibly needing a pacemaker was had. He returns to see me at this time with similar symptoms ongoing.  He actually seems better to me today than when I saw him in 2014.  At that time, his concern was with PVCs.  These seem to have resolved.  He does not feel well rested upon waking and reports that he "gives out" as the day goes on.  He has not had a sleep study.  Recent holter monitoring has documented rate controlled afib.  He also has sinus bradycardia though this is mostly noctural.   Today, he denies symptoms of palpitations,  shortness of breath,  lower extremity edema,  presyncope, or syncope.  The patient is otherwise without complaint today.   Past Medical History  Diagnosis Date  . Coronary atherosclerosis of native coronary artery     Nonobstructive  . Essential hypertension, benign   . Sinus bradycardia     Symptomatic, acebutolol discontinued  . NSVT (nonsustained ventricular tachycardia)     Holter monitor, 2011; recommended RF ablation by [name] - never required  . Heart block AV first degree     Chronic  . Mixed hyperlipidemia   . Barrett's esophagus 2011    In [place]. Records in [place]  . PAF (paroxysmal atrial fibrillation)     Recently documented in [facility] June 2016   Past Surgical History  Procedure Laterality Date  . Left leg surgery  1979  . Hernia repair      2  . Kidney stone surgery    . Colonoscopy  2014   [name]  . Esophagogastroduodenoscopy  2014    [name]  . Left heart catheterization with coronary angiogram N/A 08/20/2012    Procedure: LEFT HEART CATHETERIZATION WITH CORONARY ANGIOGRAM;  Surgeon: [name], MD;  [facility] C[facility] LAB;  Service: Cardiovascular;  Laterality: N/A;    Current Outpatient Prescriptions  Medication Sig Dispense Refill  . Aluminum Chloride (DRYSOL EX) as directed.    . Cranberry-Vitamin C-Vitamin E 4200-20-3 MG-MG-UNIT CAPS Take by mouth as directed.    [name] Kitchen lisinopril-hydrochlorothiazide (PRINZIDE,ZESTORETIC) 20-25 MG per tablet Take 1 tablet by mouth daily.    . Multiple Vitamin (MULTIVITAMIN) tablet Take 1 tablet by mouth daily.    [name] Kitchen NITROSTAT 0.4 MG SL tablet DISSOLVE 1 TABLET UNDER TONGUE EVERY 5 MINUTES AS NEEDED FOR CHEST PAIN 25 tablet 3  . pantoprazole (PROTONIX) 40 MG tablet Take 40 mg by mouth 2 (two) times daily as needed (heartburn and acid reflux).     . pravastatin (PRAVACHOL) 20 MG tablet Take 20 mg by mouth every evening.    . traMADol (ULTRAM) 50 MG tablet Take 50 mg by mouth 4 (four) times daily.    . Vitamin D, Ergocalciferol, (DRISDOL) 50000 UNITS CAPS capsule Take 50,000 Units by mouth every 7 (seven) days.     No current facility-administered medications for this visit.  Physical Exam: Filed Vitals:   11/09/14 0846  BP: 130/60  Pulse: 62  Height: 5\' 8"  (1.727 m)  Weight: 65.681 kg (144 lb 12.8 oz)    GEN- The patient is well appearing, alert and oriented x 3 today.   Head- normocephalic, atraumatic Eyes-  Sclera clear, conjunctiva pink Ears- hearing intact Oropharynx- clear Lungs- Clear to ausculation bilaterally, normal work of breathing Heart- Regular rate and rhythm, no murmurs, rubs or gallops, PMI not laterally displaced GI- soft, NT, ND, + BS Extremities- no clubbing, cyanosis, or edema  Multiple records (cath, echo, Dr Domenic Polite, event monitor) reviewed as above  Assessment  and Plan:  1. Fatigue/ dizziness Unclear etiology Possibly due to sick sinus syndrome, though on recent sterss myoview, he was able to reach 81% max predicted heart rate Will order sleep study If he does not have sleep apnea, may need to consider pacing  2. Atypical chest pain Unclear etiology Will defer to primary cardiology  3. afib Pt declines anticoagulation at this time His son states "Dad, you have been told by 3 different cardiologists that you should go on a blood thinner".  Pt is clear in his decision to decline anticoagulation at this time.  4. Pvcs- resolved  Return in 6 weeks to reassess Follow-up with Dr Domenic Polite for general cardiology  Thompson Grayer MD, [facility] 11/09/2014 4:18 PM

## 2014-11-10 ENCOUNTER — Encounter: Payer: Self-pay | Admitting: *Deleted

## 2014-11-12 ENCOUNTER — Ambulatory Visit (HOSPITAL_BASED_OUTPATIENT_CLINIC_OR_DEPARTMENT_OTHER): Payer: Managed Care, Other (non HMO)

## 2014-11-17 ENCOUNTER — Ambulatory Visit (HOSPITAL_BASED_OUTPATIENT_CLINIC_OR_DEPARTMENT_OTHER): Payer: Managed Care, Other (non HMO) | Attending: Internal Medicine | Admitting: Radiology

## 2014-11-17 DIAGNOSIS — I4891 Unspecified atrial fibrillation: Secondary | ICD-10-CM | POA: Insufficient documentation

## 2014-11-17 DIAGNOSIS — R0683 Snoring: Secondary | ICD-10-CM | POA: Insufficient documentation

## 2014-11-17 DIAGNOSIS — G4733 Obstructive sleep apnea (adult) (pediatric): Secondary | ICD-10-CM

## 2014-11-17 DIAGNOSIS — I493 Ventricular premature depolarization: Secondary | ICD-10-CM | POA: Insufficient documentation

## 2014-11-17 DIAGNOSIS — R5383 Other fatigue: Secondary | ICD-10-CM | POA: Diagnosis present

## 2014-12-06 NOTE — Sleep Study (Signed)
NAME: Brent Ray. DATE OF BIRTH:  April 16, 1945 MEDICAL RECORD NUMBER [id number]  LOCATION: [facility]  PHYSICIAN: KELLY,THOMAS A  DATE OF STUDY: 11/17/2014  SLEEP STUDY TYPE: Nocturnal Polysomnogram               REFERRING PHYSICIAN: Thompson Grayer, MD   Patient Name: Brent Ray, Brent Ray Date: 11/17/2014 Gender: Male D.O.B: 1945-06-11 Age (years): 40 Referring Provider: Thompson Grayer Height (inches): 68 Interpreting Physician: Shelva Majestic MD, ABSM Weight (lbs): 150 RPSGT: Spruill, Vicki BMI: 23 MRN: Neck Size: 13.50  CLINICAL INFORMATION Sleep Study Type: Split Night CPAP ordered but patient did not meet split night protocol.  Indication for sleep study: Fatigue, Snoring  Epworth Sleepiness Score: 1  SLEEP STUDY TECHNIQUE As per the AASM Manual for the Scoring of Sleep and Associated Events v2.3 (April 2016) with a hypopnea requiring 4% desaturations.  The channels recorded and monitored were frontal, central and occipital EEG, electrooculogram (EOG), submentalis EMG (chin), nasal and oral airflow, thoracic and abdominal wall motion, anterior tibialis EMG, snore microphone, electrocardiogram, and pulse oximetry. Continuous positive airway pressure (CPAP) was initiated when the patient met split night criteria and was titrated according to treat sleep-disordered breathing.  MEDICATIONS  Aluminum Chloride (DRYSOL EX) As directed     Cranberry-Vitamin C-Vitamin E 4200-20-3 MG-MG-UNIT CAPS As directed     lisinopril-hydrochlorothiazide (PRINZIDE,ZESTORETIC) 20-25 MG per tablet 1 tablet, Daily     Multiple Vitamin (MULTIVITAMIN) tablet 1 tablet, Daily     NITROSTAT 0.4 MG SL tablet      pantoprazole (PROTONIX) 40 MG tablet (Expired) 40 mg, 2 times daily PRN     pravastatin (PRAVACHOL) 20 MG tablet 20 mg, Every evening     traMADol (ULTRAM) 50 MG tablet 50 mg, 4 times daily     Vitamin D, Ergocalciferol, (DRISDOL)    Medications administered  by patient during sleep study : No sleep medicine administered.  RESPIRATORY PARAMETERS Diagnostic  Total AHI (/hr): 3.4 RDI (/hr): 18.3 OA Index (/hr): - CA Index (/hr): 0.0 REM AHI (/hr): 3.9 NREM AHI (/hr): 3.4 Supine AHI (/hr): 4.5 Non-supine AHI (/hr): 0.67 Min O2 Sat (%): 91.00 Mean O2 (%): 95.21 Time below 88% (min): 0.0   Titration  Optimal Pressure (cm):  AHI at Optimal Pressure (/hr): N/A Min O2 at Optimal Pressure (%): N/A Supine % at Optimal (%): N/A Sleep % at Optimal (%): N/A    SLEEP ARCHITECTURE The recording time for the entire night was 417.8 minutes.  During a baseline period of 417.8 minutes, the patient slept for 315.0 minutes in REM and nonREM, yielding a sleep efficiency of 75.4%. Sleep onset after lights out was 9.2 minutes with a REM latency of 150.0 minutes. The patient spent 20.95% of the night in stage N1 sleep, 63.49% in stage N2 sleep, 0.79% in stage N3 and 14.76% in REM. Wake after sleep onset (WASO): 93.6 minutes   CARDIAC DATA The 2 lead EKG demonstrated sinus rhythm. The mean heart rate was 55.50 beats per minute. Other EKG findings include: Atrial Fibrillation, PVCs.  LEG MOVEMENT DATA The total Periodic Limb Movements of Sleep (PLMS) were 4. The PLMS index was 0.76 .  IMPRESSIONS Increased upper airway resistance syndrome (UARS) without significant obstructive sleep apnea  (AHI = 3.4/hour).  No significant central sleep apnea occurred during the diagnostic portion of the study (CAI = 0.0/hour). Mildly reduced sleep efficiency. Abnormal sleep architecture with reduced slow wave sleep and prolonged latency to in REM sleep  Mild snoring. The arousal index was increased EKG findings include Atrial Fibrillation, PVCs. Clinically significant periodic limb movements did not occur during sleep.  DIAGNOSIS Obstructive Sleep Apnea (327.23 [G47.33 ICD-10])  RECOMMENDATIONS At present, patient does not meet criteria for CPAP therapy; Effort should be  made to optimize nasal and oral pharyngeal patency. Consider alternatives for the treatment of mild snoring. Avoid alcohol, sedatives and other CNS depressants that may worsen sleep apnea and disrupt normal sleep architecture. Sleep hygiene should be reviewed to assess factors that may improve sleep quality. Due to a positional component with supine sleep, the patient should be counseled to try avoiding sleep in the supine position    [place], American Board of Sleep Medicine  ELECTRONICALLY SIGNED ON:  12/06/2014, 8:31 PM [place] PH: [phone]   FX: [phone] [place]

## 2014-12-09 ENCOUNTER — Ambulatory Visit (INDEPENDENT_AMBULATORY_CARE_PROVIDER_SITE_OTHER): Payer: Managed Care, Other (non HMO) | Admitting: Urology

## 2014-12-09 ENCOUNTER — Telehealth: Payer: Self-pay | Admitting: *Deleted

## 2014-12-09 DIAGNOSIS — R3915 Urgency of urination: Secondary | ICD-10-CM

## 2014-12-09 DIAGNOSIS — N401 Enlarged prostate with lower urinary tract symptoms: Secondary | ICD-10-CM

## 2014-12-09 DIAGNOSIS — R351 Nocturia: Secondary | ICD-10-CM

## 2014-12-09 DIAGNOSIS — Z125 Encounter for screening for malignant neoplasm of prostate: Secondary | ICD-10-CM | POA: Diagnosis not present

## 2014-12-09 NOTE — Telephone Encounter (Signed)
-----  Message from Lauralee Evener, [place] sent at 12/09/2014 11:08 AM EDT ----- Regarding: sleep study Romelle Starcher this  Patient  I believe is one of yours. Please call and notify of results. Thanks. ----- Message -----    From: Troy Sine, MD    Sent: 12/06/2014   8:56 PM      To: Lauralee Evener, CMA  Pt did not met criteria for CPAP

## 2014-12-09 NOTE — Progress Notes (Signed)
Message forwarded to Talbert Cage, [place] sleep coordinator [address].

## 2014-12-09 NOTE — Telephone Encounter (Signed)
Spoke with patients wife (per DPR) to give her the results.  She stated verbal understanding and said that her sone would call me if he had any questions.

## 2014-12-23 ENCOUNTER — Ambulatory Visit: Payer: Managed Care, Other (non HMO) | Admitting: Internal Medicine

## 2015-01-20 ENCOUNTER — Ambulatory Visit (INDEPENDENT_AMBULATORY_CARE_PROVIDER_SITE_OTHER): Payer: Managed Care, Other (non HMO) | Admitting: Urology

## 2015-01-20 DIAGNOSIS — R3915 Urgency of urination: Secondary | ICD-10-CM | POA: Diagnosis not present

## 2015-01-20 DIAGNOSIS — N401 Enlarged prostate with lower urinary tract symptoms: Secondary | ICD-10-CM | POA: Diagnosis not present

## 2015-01-20 DIAGNOSIS — R351 Nocturia: Secondary | ICD-10-CM

## 2015-01-27 ENCOUNTER — Ambulatory Visit (INDEPENDENT_AMBULATORY_CARE_PROVIDER_SITE_OTHER): Payer: Managed Care, Other (non HMO) | Admitting: Urology

## 2015-01-27 DIAGNOSIS — R3915 Urgency of urination: Secondary | ICD-10-CM

## 2015-01-27 DIAGNOSIS — N401 Enlarged prostate with lower urinary tract symptoms: Secondary | ICD-10-CM | POA: Diagnosis not present

## 2015-01-27 DIAGNOSIS — R351 Nocturia: Secondary | ICD-10-CM

## 2015-02-08 ENCOUNTER — Other Ambulatory Visit: Payer: Self-pay | Admitting: Nurse Practitioner

## 2015-03-03 ENCOUNTER — Ambulatory Visit (INDEPENDENT_AMBULATORY_CARE_PROVIDER_SITE_OTHER): Payer: Managed Care, Other (non HMO) | Admitting: Urology

## 2015-03-03 DIAGNOSIS — R351 Nocturia: Secondary | ICD-10-CM

## 2015-03-03 DIAGNOSIS — R3915 Urgency of urination: Secondary | ICD-10-CM

## 2015-03-03 DIAGNOSIS — N401 Enlarged prostate with lower urinary tract symptoms: Secondary | ICD-10-CM | POA: Diagnosis not present

## 2015-04-14 ENCOUNTER — Ambulatory Visit (INDEPENDENT_AMBULATORY_CARE_PROVIDER_SITE_OTHER): Payer: Managed Care, Other (non HMO) | Admitting: Urology

## 2015-04-14 DIAGNOSIS — N401 Enlarged prostate with lower urinary tract symptoms: Secondary | ICD-10-CM | POA: Diagnosis not present

## 2015-04-14 DIAGNOSIS — R3915 Urgency of urination: Secondary | ICD-10-CM

## 2015-04-14 DIAGNOSIS — R351 Nocturia: Secondary | ICD-10-CM

## 2015-07-07 ENCOUNTER — Ambulatory Visit (INDEPENDENT_AMBULATORY_CARE_PROVIDER_SITE_OTHER): Payer: Managed Care, Other (non HMO) | Admitting: Cardiology

## 2015-07-07 ENCOUNTER — Encounter: Payer: Self-pay | Admitting: Cardiology

## 2015-07-07 VITALS — BP 128/60 | HR 55 | Ht 68.0 in | Wt 147.0 lb

## 2015-07-07 DIAGNOSIS — R001 Bradycardia, unspecified: Secondary | ICD-10-CM

## 2015-07-07 DIAGNOSIS — I10 Essential (primary) hypertension: Secondary | ICD-10-CM | POA: Diagnosis not present

## 2015-07-07 DIAGNOSIS — I2510 Atherosclerotic heart disease of native coronary artery without angina pectoris: Secondary | ICD-10-CM

## 2015-07-07 DIAGNOSIS — I480 Paroxysmal atrial fibrillation: Secondary | ICD-10-CM

## 2015-07-07 MED ORDER — LISINOPRIL 20 MG PO TABS: 20.0000 mg | ORAL_TABLET | Freq: Every day | ORAL | Status: DC

## 2015-07-07 NOTE — Progress Notes (Signed)
Cardiology Office Note  Date: 07/07/2015   ID: Brent Ray., DOB Oct 31, 1945, MRN [id number]  PCP: Alanda Amass, MD  Primary Cardiologist: Rozann Lesches, MD   Chief Complaint  Patient presents with  . Follow-up bradycardia  . PAF    History of Present Illness: Brent Ray. is a 70 y.o. male last seen in August 2016. I referred him back to see [name] with concerns about possible symptomatic bradycardia. Decision was to continue with observation, no plan for device treatment. Sleep study was recommended. I reviewed the report per [name] from September 2016 as outlined below, he did not have substantial degree of sleep apnea and CPAP therapy was not recommended.  He is here today with his wife for a follow-up visit. Complains of feelings of forceful heartbeat sometimes at nighttime, intermittent ankle edema when he is up on his feet for several hours, also intermittent chest discomfort. He has had reassuring cardiac evaluation in terms of coronaries with nonobstructive disease documented last year.  He also has intermittent dizziness, no syncope however. I discussed the results of his monitor as before, [name] recommendations, and his sleep study results. He is not on any medications to lower heart rate. We will continue observation at this point.  Past Medical History  Diagnosis Date  . Coronary atherosclerosis of native coronary artery     Nonobstructive  . Essential hypertension, benign   . Sinus bradycardia     Symptomatic, acebutolol discontinued  . NSVT (nonsustained ventricular tachycardia) (HCC)     Holter monitor, 2011; recommended RF ablation by [name] - never required  . Heart block AV first degree     Chronic  . Mixed hyperlipidemia   . Barrett's esophagus 2011    In [place]. Records in [place]  . PAF (paroxysmal atrial fibrillation) [name])     Recently documented in [facility] June 2016    Past Surgical History  Procedure  Laterality Date  . Left leg surgery  1979  . Hernia repair      2  . Kidney stone surgery    . [place]oscopy  2014    [place]--[name]  . Esophagogastroduodenoscopy  2014    [place]--[name]  . Left heart catheterization with coronary angiogram N/A 08/20/2012    Procedure: LEFT HEART CATHETERIZATION WITH CORONARY ANGIOGRAM;  Surgeon: [name], MD;  Location: [facility] CATH LAB;  Service: Cardiovascular;  Laterality: N/A;    Current Outpatient Prescriptions  Medication Sig Dispense Refill  . Aluminum Chloride (DRYSOL EX) as directed.    [name] Kitchen aspirin 81 MG tablet Take 81 mg by mouth daily.    . Cranberry-Vitamin C-Vitamin E 4200-20-3 MG-MG-UNIT CAPS Take by mouth as directed.    [name] Kitchen lisinopril (PRINIVIL,ZESTRIL) 20 MG tablet Take 1 tablet (20 mg total) by mouth daily. 90 tablet 3  . Multiple Vitamin (MULTIVITAMIN) tablet Take 1 tablet by mouth daily.    [name] Kitchen NITROSTAT 0.4 MG SL tablet DISSOLVE 1 TABLET UNDER TONGUE EVERY 5 MINUTES AS NEEDED FOR CHEST PAIN 25 tablet 3  . pantoprazole (PROTONIX) 40 MG tablet TAKE 1 TABLET TWICE A DAY 180 tablet 3   No current facility-administered medications for this visit.   Allergies:  Flomax and Statins   Social History: The patient  reports that he has never smoked. He has never used smokeless tobacco. He reports that he drinks about 1.2 oz of alcohol per week. He reports that he does not use illicit drugs.  ROS:  Please see the history of present illness. Otherwise, complete review of systems is positive for nocturia.  All other systems are reviewed and negative.   Physical Exam: VS:  BP 128/60 mmHg  Pulse 55  Ht 5\' 8"  (1.727 m)  Wt 147 lb (66.679 kg)  BMI 22.36 kg/m2  SpO2 98%, BMI Body mass index is 22.36 kg/(m^2).  Wt Readings from Last 3 Encounters:  07/07/15 147 lb (66.679 kg)  11/17/14 150 lb (68.04 kg)  11/09/14 144 lb 12.8 oz (65.681 kg)    Appears comfortable at rest. HEENT: Conjunctiva and lids normal. Neck:  Supple, no elevated JVP or carotid bruits, no thyromegaly. Lungs: Clear to auscultation, nonlabored breathing at rest. Cardiac: Regular rate and rhythm, no S3 or significant systolic murmur, no pericardial rub. Abdomen: Soft, nontender, bowel sounds present. Extremities: No pitting edema, distal pulses 2+. Skin: Warm and dry. Musculoskeletal: No kyphosis. Neuropsychiatric: Alert and oriented x3, affect grossly appropriate.  ECG: I personally reviewed the prior tracing from 08/24/2014 which showed sinus bradycardia with prolonged PR interval, anteroseptal Q waves.  Recent Labwork: No results found for requested labs within last 365 days.     Component Value Date/Time   CHOL 173 01/24/2012 0829   TRIG 96.0 01/24/2012 0829   HDL 59.70 01/24/2012 0829   CHOLHDL 3 01/24/2012 0829   VLDL 19.2 01/24/2012 0829   LDLCALC 94 01/24/2012 0829    Other Studies Reviewed Today:  Cardiac catheterization 08/24/2014 [facility]): Normal left main. 40% first diagonal stenosis. 30% mid circumflex stenosis. 20% mid RCA stenosis.  Sleep study 12/06/2014: IMPRESSIONS Increased upper airway resistance syndrome (UARS) without significant obstructive sleep apnea (AHI = 3.4/hour).  No significant central sleep apnea occurred during the diagnostic portion of the study (CAI = 0.0/hour). Mildly reduced sleep efficiency. Abnormal sleep architecture with reduced slow wave sleep and prolonged latency to in REM sleep Mild snoring. The arousal index was increased EKG findings include Atrial Fibrillation, PVCs. Clinically significant periodic limb movements did not occur during sleep.  DIAGNOSIS Obstructive Sleep Apnea (327.23 [G47.33 ICD-10])  RECOMMENDATIONS At present, patient does not meet criteria for CPAP therapy; Effort should be made to optimize nasal and oral pharyngeal patency. Consider alternatives for the treatment of mild snoring. Avoid alcohol, sedatives and other CNS depressants that may worsen  sleep apnea and disrupt normal sleep architecture. Sleep hygiene should be reviewed to assess factors that may improve sleep quality. Due to a positional component with supine sleep, the patient should be counseled to try avoiding sleep in the supine position  Assessment and Plan:  1. Symptomatic bradycardia. As noted above, plan is observation for now following EP evaluation by [name], avoiding any heart rate lowering medications. He has had no syncope or obvious progression in symptoms. Sleep study did not suggest significant degree of OSA to be contributing.  2. History of nonobstructive CAD documented by cardiac catheterization last year in [place].  3. Paroxysmal atrial fibrillation with CHADSVASC score of 3. He has consistently declined anticoagulation for stroke prophylaxis. Continues on aspirin.  4. Essential hypertension, remains on lisinopril. Blood pressure is adequately controlled today.  Current medicines were reviewed with the patient today.  Disposition: FU with me in 6 months.   Signed, Satira Sark, MD, [facility] 07/07/2015 11:03 AM    [place] [facility] [place], [place],  [address] Phone: [phone]; Fax: [phone]

## 2015-07-07 NOTE — Patient Instructions (Signed)
Your physician recommends that you continue on your current medications as directed. Please refer to the Current Medication list given to you today. Your physician recommends that you schedule a follow-up appointment in: 6 months. You will receive a reminder letter in the mail in about 4 months reminding you to call and schedule your appointment. If you don't receive this letter, please contact our office. 

## 2015-12-13 ENCOUNTER — Encounter (HOSPITAL_COMMUNITY): Payer: Self-pay

## 2015-12-13 ENCOUNTER — Encounter (HOSPITAL_COMMUNITY)
Admission: EM | Disposition: A | Discharge status: Home / Self Care | Payer: Self-pay | Source: Other Acute Inpatient Hospital | Attending: Cardiovascular Disease

## 2015-12-13 ENCOUNTER — Inpatient Hospital Stay (HOSPITAL_COMMUNITY)
Admission: EM | Admit: 2015-12-13 | Discharge: 2015-12-16 | DRG: 287 | Disposition: A | Discharge status: Home / Self Care | Payer: Managed Care, Other (non HMO) | Source: Other Acute Inpatient Hospital | Attending: Cardiovascular Disease | Admitting: Cardiovascular Disease

## 2015-12-13 DIAGNOSIS — E782 Mixed hyperlipidemia: Secondary | ICD-10-CM | POA: Diagnosis present

## 2015-12-13 DIAGNOSIS — I110 Hypertensive heart disease with heart failure: Secondary | ICD-10-CM | POA: Diagnosis not present

## 2015-12-13 DIAGNOSIS — I2510 Atherosclerotic heart disease of native coronary artery without angina pectoris: Secondary | ICD-10-CM

## 2015-12-13 DIAGNOSIS — I209 Angina pectoris, unspecified: Secondary | ICD-10-CM | POA: Diagnosis not present

## 2015-12-13 DIAGNOSIS — R001 Bradycardia, unspecified: Secondary | ICD-10-CM | POA: Diagnosis present

## 2015-12-13 DIAGNOSIS — R0781 Pleurodynia: Secondary | ICD-10-CM

## 2015-12-13 DIAGNOSIS — Z800 Family history of malignant neoplasm of digestive organs: Secondary | ICD-10-CM

## 2015-12-13 DIAGNOSIS — F419 Anxiety disorder, unspecified: Secondary | ICD-10-CM | POA: Diagnosis present

## 2015-12-13 DIAGNOSIS — Z7982 Long term (current) use of aspirin: Secondary | ICD-10-CM

## 2015-12-13 DIAGNOSIS — I480 Paroxysmal atrial fibrillation: Secondary | ICD-10-CM | POA: Diagnosis not present

## 2015-12-13 DIAGNOSIS — Z87442 Personal history of urinary calculi: Secondary | ICD-10-CM | POA: Diagnosis not present

## 2015-12-13 DIAGNOSIS — I25110 Atherosclerotic heart disease of native coronary artery with unstable angina pectoris: Principal | ICD-10-CM | POA: Diagnosis present

## 2015-12-13 DIAGNOSIS — I4821 Permanent atrial fibrillation: Secondary | ICD-10-CM

## 2015-12-13 DIAGNOSIS — R7989 Other specified abnormal findings of blood chemistry: Secondary | ICD-10-CM

## 2015-12-13 DIAGNOSIS — R079 Chest pain, unspecified: Secondary | ICD-10-CM | POA: Diagnosis present

## 2015-12-13 DIAGNOSIS — Z8249 Family history of ischemic heart disease and other diseases of the circulatory system: Secondary | ICD-10-CM | POA: Diagnosis not present

## 2015-12-13 DIAGNOSIS — I509 Heart failure, unspecified: Secondary | ICD-10-CM | POA: Diagnosis present

## 2015-12-13 DIAGNOSIS — Z0000 Encounter for general adult medical examination without abnormal findings: Secondary | ICD-10-CM

## 2015-12-13 DIAGNOSIS — I428 Other cardiomyopathies: Secondary | ICD-10-CM | POA: Diagnosis not present

## 2015-12-13 DIAGNOSIS — Z79899 Other long term (current) drug therapy: Secondary | ICD-10-CM | POA: Diagnosis not present

## 2015-12-13 DIAGNOSIS — I5181 Takotsubo syndrome: Secondary | ICD-10-CM | POA: Diagnosis present

## 2015-12-13 DIAGNOSIS — Z7901 Long term (current) use of anticoagulants: Secondary | ICD-10-CM

## 2015-12-13 DIAGNOSIS — I482 Chronic atrial fibrillation: Secondary | ICD-10-CM | POA: Diagnosis present

## 2015-12-13 DIAGNOSIS — I200 Unstable angina: Secondary | ICD-10-CM | POA: Diagnosis not present

## 2015-12-13 HISTORY — PX: CARDIAC CATHETERIZATION: SHX172

## 2015-12-13 LAB — BASIC METABOLIC PANEL
ANION GAP: 10 (ref 5–15)
BUN: 9 mg/dL (ref 6–20)
CALCIUM: 8.4 mg/dL — AB (ref 8.9–10.3)
CO2: 21 mmol/L — AB (ref 22–32)
Chloride: 109 mmol/L (ref 101–111)
Creatinine, Ser: 1.26 mg/dL — ABNORMAL HIGH (ref 0.61–1.24)
GFR, EST NON AFRICAN AMERICAN: 57 mL/min — AB (ref >60)
GLUCOSE: 91 mg/dL (ref 65–99)
POTASSIUM: 4.4 mmol/L (ref 3.5–5.1)
Sodium: 140 mmol/L (ref 135–145)

## 2015-12-13 LAB — TROPONIN I: Troponin I: 0.31 ng/mL (ref <0.03)

## 2015-12-13 LAB — CBC
HEMATOCRIT: 44.8 % (ref 39.0–52.0)
HEMOGLOBIN: 14.6 g/dL (ref 13.0–17.0)
MCH: 32.4 pg (ref 26.0–34.0)
MCHC: 32.6 g/dL (ref 30.0–36.0)
MCV: 99.6 fL (ref 78.0–100.0)
Platelets: 225 10*3/uL (ref 150–400)
RBC: 4.5 MIL/uL (ref 4.22–5.81)
RDW: 13.5 % (ref 11.5–15.5)
WBC: 7.8 10*3/uL (ref 4.0–10.5)

## 2015-12-13 LAB — PROTIME-INR
INR: 1.08
Prothrombin Time: 14 seconds (ref 11.4–15.2)

## 2015-12-13 LAB — MRSA PCR SCREENING: MRSA BY PCR: NEGATIVE

## 2015-12-13 SURGERY — LEFT HEART CATH AND CORONARY ANGIOGRAPHY

## 2015-12-13 MED ORDER — HEPARIN (PORCINE) IN NACL 2-0.9 UNIT/ML-% IJ SOLN
INTRAMUSCULAR | Status: DC | PRN
  Administered 2015-12-13: 1000 mL

## 2015-12-13 MED ORDER — NITROGLYCERIN 0.4 MG SL SUBL: 0.4000 mg | SUBLINGUAL_TABLET | SUBLINGUAL | Status: DC | PRN

## 2015-12-13 MED ORDER — SODIUM CHLORIDE 0.9% FLUSH: 3.0000 mL | Freq: Two times a day (BID) | INTRAVENOUS | Status: DC

## 2015-12-13 MED ORDER — FENTANYL CITRATE (PF) 100 MCG/2ML IJ SOLN
INTRAMUSCULAR | Status: AC
  Filled 2015-12-13: qty 2

## 2015-12-13 MED ORDER — NITROGLYCERIN IN D5W 200-5 MCG/ML-% IV SOLN
INTRAVENOUS | Status: AC
  Filled 2015-12-13: qty 250

## 2015-12-13 MED ORDER — SODIUM CHLORIDE 0.9 % WEIGHT BASED INFUSION
1.0000 mL/kg/h | INTRAVENOUS | Status: AC
  Administered 2015-12-13: 1 mL/kg/h via INTRAVENOUS

## 2015-12-13 MED ORDER — ACETAMINOPHEN 325 MG PO TABS
650.0000 mg | ORAL_TABLET | ORAL | Status: DC | PRN
  Administered 2015-12-13: 650 mg via ORAL
  Filled 2015-12-13 (×2): qty 2

## 2015-12-13 MED ORDER — FENTANYL CITRATE (PF) 100 MCG/2ML IJ SOLN
INTRAMUSCULAR | Status: DC | PRN
  Administered 2015-12-13 (×2): 25 ug via INTRAVENOUS

## 2015-12-13 MED ORDER — MIDAZOLAM HCL 2 MG/2ML IJ SOLN
INTRAMUSCULAR | Status: AC
  Filled 2015-12-13: qty 2

## 2015-12-13 MED ORDER — IOPAMIDOL (ISOVUE-370) INJECTION 76%
INTRAVENOUS | Status: AC
  Filled 2015-12-13: qty 100

## 2015-12-13 MED ORDER — SODIUM CHLORIDE 0.9% FLUSH
3.0000 mL | Freq: Two times a day (BID) | INTRAVENOUS | Status: DC
  Administered 2015-12-13 – 2015-12-15 (×5): 3 mL via INTRAVENOUS

## 2015-12-13 MED ORDER — MORPHINE SULFATE (PF) 2 MG/ML IV SOLN
2.0000 mg | INTRAVENOUS | Status: DC | PRN
  Administered 2015-12-13 – 2015-12-14 (×2): 2 mg via INTRAVENOUS
  Filled 2015-12-13 (×2): qty 1

## 2015-12-13 MED ORDER — IOPAMIDOL (ISOVUE-370) INJECTION 76%
INTRAVENOUS | Status: DC | PRN
  Administered 2015-12-13: 60 mL via INTRAVENOUS

## 2015-12-13 MED ORDER — VERAPAMIL HCL 2.5 MG/ML IV SOLN
INTRA_ARTERIAL | Status: DC | PRN
  Administered 2015-12-13: 5 mL via INTRA_ARTERIAL

## 2015-12-13 MED ORDER — ONDANSETRON HCL 4 MG/2ML IJ SOLN: 4.0000 mg | Freq: Four times a day (QID) | INTRAMUSCULAR | Status: DC | PRN

## 2015-12-13 MED ORDER — HEPARIN BOLUS VIA INFUSION
3500.0000 [IU] | Freq: Once | INTRAVENOUS | Status: DC
  Filled 2015-12-13: qty 3500

## 2015-12-13 MED ORDER — LISINOPRIL 20 MG PO TABS: 20.0000 mg | ORAL_TABLET | Freq: Every day | ORAL | Status: DC

## 2015-12-13 MED ORDER — SODIUM CHLORIDE 0.9% FLUSH: 3.0000 mL | INTRAVENOUS | Status: DC | PRN

## 2015-12-13 MED ORDER — HEPARIN (PORCINE) IN NACL 2-0.9 UNIT/ML-% IJ SOLN
INTRAMUSCULAR | Status: AC
  Filled 2015-12-13: qty 1000

## 2015-12-13 MED ORDER — PANTOPRAZOLE SODIUM 40 MG PO TBEC
40.0000 mg | DELAYED_RELEASE_TABLET | Freq: Two times a day (BID) | ORAL | Status: DC
  Administered 2015-12-13 – 2015-12-16 (×6): 40 mg via ORAL
  Filled 2015-12-13 (×6): qty 1

## 2015-12-13 MED ORDER — VERAPAMIL HCL 2.5 MG/ML IV SOLN
INTRAVENOUS | Status: AC
  Filled 2015-12-13: qty 2

## 2015-12-13 MED ORDER — ASPIRIN 81 MG PO CHEW
81.0000 mg | CHEWABLE_TABLET | ORAL | Status: AC
  Administered 2015-12-13: 81 mg via ORAL
  Filled 2015-12-13: qty 1

## 2015-12-13 MED ORDER — MIDAZOLAM HCL 2 MG/2ML IJ SOLN
INTRAMUSCULAR | Status: DC | PRN
  Administered 2015-12-13: 1 mg via INTRAVENOUS

## 2015-12-13 MED ORDER — ASPIRIN 81 MG PO CHEW: 81.0000 mg | CHEWABLE_TABLET | Freq: Every day | ORAL | Status: DC

## 2015-12-13 MED ORDER — LIDOCAINE HCL (PF) 1 % IJ SOLN
INTRAMUSCULAR | Status: DC | PRN
  Administered 2015-12-13: 2 mL

## 2015-12-13 MED ORDER — HEPARIN (PORCINE) IN NACL 100-0.45 UNIT/ML-% IJ SOLN
800.0000 [IU]/h | INTRAMUSCULAR | Status: DC
  Administered 2015-12-14: 800 [IU]/h via INTRAVENOUS
  Filled 2015-12-13: qty 250

## 2015-12-13 MED ORDER — LIDOCAINE HCL (PF) 1 % IJ SOLN
INTRAMUSCULAR | Status: AC
  Filled 2015-12-13: qty 30

## 2015-12-13 MED ORDER — NITROGLYCERIN IN D5W 200-5 MCG/ML-% IV SOLN
INTRAVENOUS | Status: DC | PRN
  Administered 2015-12-13: 10 ug/min via INTRAVENOUS

## 2015-12-13 MED ORDER — LISINOPRIL 20 MG PO TABS
20.0000 mg | ORAL_TABLET | Freq: Every day | ORAL | Status: DC
  Administered 2015-12-14 – 2015-12-16 (×3): 20 mg via ORAL
  Filled 2015-12-13 (×4): qty 1

## 2015-12-13 MED ORDER — NITROGLYCERIN 1 MG/10 ML FOR IR/CATH LAB
INTRA_ARTERIAL | Status: AC
  Filled 2015-12-13: qty 10

## 2015-12-13 MED ORDER — HEPARIN SODIUM (PORCINE) 1000 UNIT/ML IJ SOLN
INTRAMUSCULAR | Status: DC | PRN
  Administered 2015-12-13: 3500 [IU] via INTRAVENOUS

## 2015-12-13 MED ORDER — ACETAMINOPHEN 325 MG PO TABS: 650.0000 mg | ORAL_TABLET | ORAL | Status: DC | PRN

## 2015-12-13 MED ORDER — ALPRAZOLAM 0.5 MG PO TABS
0.5000 mg | ORAL_TABLET | Freq: Three times a day (TID) | ORAL | Status: DC | PRN
  Administered 2015-12-13 – 2015-12-15 (×5): 0.5 mg via ORAL
  Filled 2015-12-13 (×6): qty 1

## 2015-12-13 MED ORDER — SODIUM CHLORIDE 0.9 % IV SOLN: 250.0000 mL | INTRAVENOUS | Status: DC | PRN

## 2015-12-13 MED ORDER — SODIUM CHLORIDE 0.9 % WEIGHT BASED INFUSION: 1.0000 mL/kg/h | INTRAVENOUS | Status: DC

## 2015-12-13 MED ORDER — MORPHINE SULFATE (PF) 2 MG/ML IV SOLN
2.0000 mg | INTRAVENOUS | Status: DC | PRN
  Administered 2015-12-13: 2 mg via INTRAVENOUS
  Filled 2015-12-13: qty 1

## 2015-12-13 MED ORDER — ASPIRIN EC 81 MG PO TBEC
81.0000 mg | DELAYED_RELEASE_TABLET | Freq: Every day | ORAL | Status: DC
  Administered 2015-12-14 – 2015-12-16 (×3): 81 mg via ORAL
  Filled 2015-12-13 (×4): qty 1

## 2015-12-13 MED ORDER — HEPARIN (PORCINE) IN NACL 100-0.45 UNIT/ML-% IJ SOLN
800.0000 [IU]/h | INTRAMUSCULAR | Status: DC
  Administered 2015-12-13: 800 [IU]/h via INTRAVENOUS

## 2015-12-13 MED ORDER — HEPARIN SODIUM (PORCINE) 1000 UNIT/ML IJ SOLN
INTRAMUSCULAR | Status: AC
  Filled 2015-12-13: qty 1

## 2015-12-13 MED ORDER — ASPIRIN EC 81 MG PO TBEC: 81.0000 mg | DELAYED_RELEASE_TABLET | Freq: Every day | ORAL | Status: DC

## 2015-12-13 MED ORDER — SODIUM CHLORIDE 0.9 % WEIGHT BASED INFUSION
3.0000 mL/kg/h | INTRAVENOUS | Status: DC
  Administered 2015-12-13: 3 mL/kg/h via INTRAVENOUS

## 2015-12-13 MED ORDER — SODIUM CHLORIDE 0.9% FLUSH
3.0000 mL | INTRAVENOUS | Status: DC | PRN
  Administered 2015-12-15: 3 mL via INTRAVENOUS
  Filled 2015-12-13: qty 3

## 2015-12-13 SURGICAL SUPPLY — 11 items
CATH INFINITI 5 FR STR PIGTAIL (CATHETERS) ×3 IMPLANT
CATH OPTITORQUE TIG 4.0 5F (CATHETERS) ×3 IMPLANT
DEVICE RAD COMP TR BAND LRG (VASCULAR PRODUCTS) ×3 IMPLANT
GLIDESHEATH SLEND A-KIT 6F 22G (SHEATH) ×3 IMPLANT
KIT HEART LEFT (KITS) ×3 IMPLANT
PACK CARDIAC CATHETERIZATION (CUSTOM PROCEDURE TRAY) ×3 IMPLANT
SYR MEDRAD MARK V 150ML (SYRINGE) ×3 IMPLANT
TRANSDUCER W/STOPCOCK (MISCELLANEOUS) ×3 IMPLANT
TUBING CIL FLEX 10 FLL-RA (TUBING) ×3 IMPLANT
WIRE HI TORQ VERSACORE-J 145CM (WIRE) ×3 IMPLANT
WIRE SAFE-T 1.5MM-J .035X260CM (WIRE) ×3 IMPLANT

## 2015-12-13 NOTE — Plan of Care (Signed)
Problem: Consults Goal: Cardiac Cath Patient Education (See Patient Education module for education specifics.) Outcome: Completed/Met Date Met: 12/13/15 Pt received education regarding cardiac cath   Problem: Phase I Progression Outcomes Goal: Distal pulses equal to baseline Outcome: Completed/Met Date Met: 12/13/15 Distal pulses are equal to baseline   Problem: Education: Goal: Knowledge of [place] Education information/materials will improve Outcome: Completed/Met Date Met: 12/13/15 Pt received education regarding tests, procedures, labs, medications, and available resources during this admission   Problem: Safety: Goal: Ability to remain free from injury will improve Outcome: Completed/Met Date Met: 12/13/15 Pt has remained free from injury

## 2015-12-13 NOTE — H&P (Signed)
History & Physical    Patient ID: Brent Ray. MRN: [id number], DOB/AGE: May 27, 1945   Admit date: 12/13/2015   Primary Physician: Alanda Amass, MD Primary Cardiologist: Dr. Domenic Polite   History of Present Illness    Brent Glenny. is a 70 y.o. 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   The patient reports he has been experiencing worsening fatigue since last fall. Notes occasional lightheadedness and dizziness. HR at home varies from 30's - 50's. Has occasional episodes of palpitations during the night. Over the past few weeks, he notes worsening dyspnea with exertion. Says this can occur throughout the day and improves with rest. Has been experiencing chest pain with exertion for the past several weeks as well but notes yesterday it was the most severe. He worked throughout the day without any issues, but says upon driving home he developed significant chest discomfort while walking down his driveway. He took SL NTG with minimal relief. The pain persisted and was more intense than "any previous episodes in the past", therefore he went to [facility] for further observation.   While at [facility], labs showed a WBC of 6.3, Hgb 15.3, and platelets 255. K+ 3.2. Creatinine 1.13. Initial troponin < 0.01 but second value elevated at 0.19. CXR with chronic bronchitic changes in the lungs but no evidence of active pulmonary disease. EKG shows atrial fibrillation, HR 64, with ST depression in lateral leads.  He continues to have mild chest discomfort at this time. Is currently on Heparin drip and has a NTG patch in place. Breathing is at baseline. Denies any lightheadedness or dizziness. Is in atrial fibrillation on telemetry with  pauses up to 2.2 seconds noted and HR mostly in the 50's.   Last ischemic evaluation was a cardiac catheterization at [facility] in 08/2014 which showed 40% ostial first diagonal stenosis, 30% circumflex stenosis, and 20% RCA stenosis. Had cath here in 08/2012 which showed nonobstructive CAD.    Past Medical History    Past Medical History:  Diagnosis Date  . Barrett's esophagus 2011   In [place]. Records in [place]  . Coronary atherosclerosis of native coronary artery    Nonobstructive  . Essential hypertension, benign   . Heart block AV first degree    Chronic  . Mixed hyperlipidemia   . NSVT (nonsustained ventricular tachycardia) (HCC)    Holter monitor, 2011; recommended RF ablation by [name] - never required  . PAF (paroxysmal atrial fibrillation) ([place])    Recently documented in [facility] June 2016  . Sinus bradycardia    Symptomatic, acebutolol discontinued    Past Surgical History:  Procedure Laterality Date  . COLONOSCOPY  2014   [place], [place]--[name]  . ESOPHAGOGASTRODUODENOSCOPY  2014   [place]--[name]  . HERNIA REPAIR     2  . KIDNEY STONE SURGERY    . LEFT HEART CATHETERIZATION WITH CORONARY ANGIOGRAM N/A 08/20/2012   Procedure: LEFT HEART CATHETERIZATION WITH CORONARY ANGIOGRAM;  Surgeon: [name], MD;  Location: [facility] CATH LAB;  Service: Cardiovascular;  Laterality: N/A;  . Left leg surgery  1979     Allergies  Allergies  Allergen Reactions  . Flomax [Tamsulosin Hcl] Other (See Comments)    Itch, rash/hives  . Statins Other (See Comments)  Leg cramps     Home Medications    Prior to Admission medications   Medication Sig Start Date End Date Taking? Authorizing Provider  aspirin 81 MG tablet Take 81 mg by mouth daily.   Yes Historical Provider, MD  Cranberry 400 MG CAPS Take 4,200 mg by mouth daily.   Yes Historical Provider, MD  Glucosamine-MSM-Hyaluronic Acd (JOINT HEALTH PO) Take 1 tablet by mouth daily.   Yes  Historical Provider, MD  lisinopril (PRINIVIL,ZESTRIL) 20 MG tablet Take 1 tablet (20 mg total) by mouth daily. 07/07/15  Yes Satira Sark, MD  NITROSTAT 0.4 MG SL tablet DISSOLVE 1 TABLET UNDER TONGUE EVERY 5 MINUTES AS NEEDED FOR CHEST PAIN 05/11/14  Yes Satira Sark, MD  pantoprazole (PROTONIX) 40 MG tablet TAKE 1 TABLET TWICE A DAY Patient taking differently: Take 40 mg by mouth twice daily 02/08/15  Yes Gatha Mayer, MD  vitamin B-12 (CYANOCOBALAMIN) 1000 MCG tablet Take 1,500 mcg by mouth daily.   Yes Historical Provider, MD    Family History    Family History  Problem Relation Age of Onset  . Colon cancer Mother   . Other Sister      AGE-10-HEALTHY  . Other Sister     AGE 62-HEALTHY  . Other Sister     AGE 40 HEALTHY  . Other Son     AGE 90 HEALTHY    Social History    Social History   Social History  . Marital status: Married    Spouse name: N/A  . Number of children: 1  . Years of education: N/A   Occupational History  . 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 Smoking status: Never Smoker  . Smokeless tobacco: Never Used  . Alcohol use 1.2 oz/week    2 Cans of beer per week     Comment: Occasional beer  . Drug use: No  . Sexual activity: Yes    Partners: Female   Other Topics Concern  . 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 Married and lives in [place], [place].   Review of Systems    General:  No chills, fever, night sweats or weight changes.  Cardiovascular:  No edema, orthopnea, paroxysmal nocturnal dyspnea. Positive for chest pain, dyspnea with exertion, and palpitations.  Dermatological: No rash, lesions/masses Respiratory: No cough, dyspnea Urologic: No hematuria, dysuria Abdominal:   No nausea, vomiting, diarrhea, bright red blood per rectum, melena, or hematemesis Neurologic:  No visual changes, wkns,  changes in mental status. Positive for dizziness.  All other systems reviewed and are otherwise negative except as noted above.  Physical Exam    Blood pressure 117/68, pulse 62, temperature (!) 96.8 F (36 C), temperature source Oral, height 5\' 8"  (1.727 m), weight 142 lb (64.4 kg), SpO2 99 %.  General: Well developed, well nourished,male appearing in no acute distress. Head: Normocephalic, atraumatic, sclera non-icteric, no xanthomas, nares are without discharge. Neck: No carotid bruits. JVD not elevated.  Lungs: Respirations regular and unlabored, without wheezes or rales.  Heart: Irregularly irregular. No S3 or S4.  No murmur, no rubs, or gallops appreciated. Abdomen: Soft, non-tender, non-distended with normoactive bowel sounds. No hepatomegaly. No rebound/guarding. No obvious abdominal masses. Msk:  Strength and tone appear normal for age. No joint deformities or effusions. Extremities: No clubbing or cyanosis.  No edema. Distal pedal pulses are 2+ bilaterally. Neuro: Alert and oriented X 3. Moves all extremities spontaneously. No focal deficits noted. Psych:  Responds to questions appropriately with a normal affect. Skin: No rashes or lesions noted  Labs    Labs from [facility]: 12/12/2015 WBC of 6.3, Hgb 15.3, and platelets 255. K+ 3.2. Creatinine 1.13. Initial troponin < 0.01 but second value elevated at 0.19.   Lab Results  Component Value Date   CHOL 173 01/24/2012   HDL 59.70 01/24/2012   LDLCALC 94 01/24/2012   TRIG 96.0 01/24/2012    Radiology Studies   CXR from Outside Hospital: Chronic bronchitic changes in the lungs but no evidence of active pulmonary disease.   EKG & Cardiac Imaging    EKG: Atrial fibrillation, HR 64, with ST depression in lateral leads   ECHOCARDIOGRAM: 08/2014    CARDIAC CATHETERIZATION: 08/2014   Assessment & Plan    1. Chest Pain with history of nonobstructive CAD - notes worsening chest discomfort and dyspnea with exertion  for the past 3 weeks, relieved with rest. Developed episode yesterday while walking down his driveway which persisted until administration of SL NTG and Morphine.  - last ischemic evaluation was a cath in 08/2014 at [facility] which showed 40% ostial first diagonal stenosis, 30% circumflex stenosis, and 20% RCA stenosis (records scanned).  - outside labs showed an initial troponin < 0.01 but second value elevated at 0.19. EKG shows atrial fibrillation, HR 64, with ST depression in lateral leads. - started on Heparin at [facility]. NTG patch in place. Still having mild chest discomfort on examination.  - will symptoms concerning for unstable angina, elevated troponin, and continued chest discomfort will plan for repeat cardiac catheterization today. Keep NPO until procedure. Continue Heparin.  2. Paroxysmal Atrial Fibrillation - currently in rate-controlled atrial fibrillation. Likely persistent at this point. - This patients CHA2DS2-VASc Score and unadjusted Ischemic Stroke Rate (% per year) is equal to 3.2 % stroke rate/year from a score of 3 (HTN, CAD, Age). Has refused anticoagulation multiple times. Educated again today on increased thrombotic events in the setting of atrial fibrillation and continues to refuse anticoagulation.   3. Bradycardia - history of HR in the 30's. Currently in the 50's. Occasional pauses up to 2.2 seconds noted while in atrial fibrillation. - followed by [name] as an outpatient. If significant pauses noted on telemetry, would recommend EP consult.   4. HTN - continue home Lisinopril.    Signed, Erma Heritage, PA-C 12/13/2015, 10:46 AM Pager: [phone]  Patient seen and examined. Agree with assessment and plan.  Brent Ray is a 70 year old Caucasian male has a history of previously documented wild nonobstructive CAD.  He has undergone 2 prior catheterizations which is with his last study being done at [facility] in June 2016.  He has  a history of atrial fibrillation which in the past has been paroxysmal, but may very well be persistent or permanent.  He is not on anticoagulation therapy.  He developed significant substernal chest tightness after work last evening when walking from his car back to his house.  He received nitroglycerin and ultimately presented to [facility] this pain ultimately subsided with morphine.  He currently is on IV heparin and nitroglycerin patch.  His initial troponin was normal, but a second value was slightly increased at 0.19.  There is no history of tobacco use or awareness of hyperlipidemia.  There is a family history for CAD.  His ECG  does not reveal acute ST-T changes, but he does have poor progression V1 through V3 with possible Q waves.  He has a history of hypertension and has been on lisinopril.  He also has a history of bradycardia while in sinus rhythm.  His symptom complex is worrisome for progressive angina.  With his significant work requirements as a Dealer, and mild troponin elevation, I have recommended definitive diagnostic cardiac catheterization.  He has a history of dyspepsia and has been on Protonix.  If cardiac catheterization does not demonstrate significant progressive disease, consider addition of amlodipine to his medical regimen and of potential coronary or esophageal spasm. I have reviewed the risks, indications, and alternatives to cardiac catheterization, possible angioplasty, and stenting with the patient. Risks include but are not limited to bleeding, infection, vascular injury, stroke, myocardial infection, arrhythmia, kidney injury, radiation-related injury in the case of prolonged fluoroscopy use, emergency cardiac surgery, and death. The patient understands the risks of serious complication is 1-2 in [id number] with diagnostic cardiac cath and 1-2% or less with angioplasty/stenting.  Will arrange for cardiac catheterization later this afternoon.   Troy Sine, MD,  [facility] 12/13/2015 12:04 PM

## 2015-12-13 NOTE — Progress Notes (Signed)
ANTICOAGULATION CONSULT NOTE -Follow up Pharmacy Consult for Heparin Indication: chest pain/ACS  Allergies  Allergen Reactions  . Flomax [Tamsulosin Hcl] Other (See Comments)    Itch, rash/hives  . Statins Other (See Comments)    Leg cramps    Patient Measurements: Height: 5\' 8"  (172.7 cm) Weight: 142 lb (64.4 kg) IBW/kg (Calculated) : 68.4 Heparin Dosing Weight: 64.4 kg  Vital Signs: Temp: 98.1 F (36.7 C) (10/02 1351) Temp Source: Oral (10/02 1351) BP: 151/93 (10/02 1900) Pulse Rate: 64 (10/02 1900)  Labs:  Recent Labs  12/13/15 1256  HGB 14.6  HCT 44.8  PLT 225  LABPROT 14.0  INR 1.08  CREATININE 1.26*  TROPONINI 0.31*    Estimated Creatinine Clearance: 51.1 mL/min (by C-G formula based on SCr of 1.26 mg/dL (H)).  Assessment: 70 yo M presents on 10/2 with CP. Transferred from [facility] to [facility] for cath today. Cards consulting pharmacy to start heparin gtt. No anticoag PTA.   S/p cardiac cath-mild to moderate disease. RCA 40% prox disease,  Diagonal branch 60% ostial stenosis. EF 20-25%, consistent with "takasubo " cardiomyopathy.  No notes or orders regarding if to dc or restart IV heparin drip post cath. I spoke to [name] via phone. He ordered to restart IV heparin due to cardiomyopathy, low EF (to prevent a clot) , to restart IV heparin drip without bolus, 8 hr after TR band removal.   RN reported that TR band removed at 19:15. No bleeding or hematoma reported.     Goal of Therapy:  Heparin level 0.3-0.7 units/ml Monitor platelets by anticoagulation protocol: Yes   Plan:  Restart heparin gtt at 800 units/hr, 8 hours after TR band removed. TR band removed at 19:15.  Restart IV heparin ~ 03:15 tomorrow AM 12/14/15 Check heparin level 6 hours after heparin restarted Monitor daily heparin level, CBC, s/s of bleed  Thank you for allowing pharmacy to be part of this patients care team. Nicole Cella, RPh Clinical Pharmacist Pager: [phone] 12/13/2015 9:05  PM

## 2015-12-13 NOTE — Interval H&P Note (Signed)
Cath Lab Visit (complete for each Cath Lab visit)  Clinical Evaluation Leading to the Procedure:   ACS: Yes.    Non-ACS:    Anginal Classification: CCS IV  Anti-ischemic medical therapy: Maximal Therapy (2 or more classes of medications)  Non-Invasive Test Results: No non-invasive testing performed  Prior CABG: No previous CABG      History and Physical Interval Note:  12/13/2015 2:44 PM  [name].  has presented today for surgery, with the diagnosis of cp  The various methods of treatment have been discussed with the patient and family. After consideration of risks, benefits and other options for treatment, the patient has consented to  Procedure(s): Left Heart Cath and Coronary Angiography (N/A) as a surgical intervention .  The patient's history has been reviewed, patient examined, no change in status, stable for surgery.  I have reviewed the patient's chart and labs.  Questions were answered to the patient's satisfaction.     Quay Burow

## 2015-12-13 NOTE — Progress Notes (Signed)
pharmacist called and alerted RN that she spoke with provider. Will restart heparin 8 hours after TR band deflated. RN to contact Rod Holler at [phone] when TR band deflated.

## 2015-12-13 NOTE — Progress Notes (Signed)
ANTICOAGULATION CONSULT NOTE - Initial Consult  Pharmacy Consult for Heparin Indication: chest pain/ACS  Allergies  Allergen Reactions  . Flomax [Tamsulosin Hcl] Other (See Comments)    Itch, rash/hives  . Statins Other (See Comments)    Leg cramps    Patient Measurements: Height: 5\' 8"  (172.7 cm) Weight: 142 lb (64.4 kg) IBW/kg (Calculated) : 68.4 Heparin Dosing Weight: 64.4 kg  Vital Signs: Temp: 96.8 F (36 C) (10/02 0937) Temp Source: Oral (10/02 0937) BP: 117/68 (10/02 0937) Pulse Rate: 62 (10/02 0937)  Labs: No results for input(s): HGB, HCT, PLT, APTT, LABPROT, INR, HEPARINUNFRC, HEPRLOWMOCWT, CREATININE, CKTOTAL, CKMB, TROPONINI in the last 72 hours.  CrCl cannot be calculated (No order found.).  Assessment: 70 yo M presents on 10/2 with CP. Transferred from [facility] to [facility] for cath. Cards consulting pharmacy to start heparin gtt. No anticoag PTA. Plan for cath later today.   Goal of Therapy:  Heparin level 0.3-0.7 units/ml Monitor platelets by anticoagulation protocol: Yes   Plan:  Give 3,500 unit heparin bolus Start heparin gtt at 800 units/hr Monitor daily heparin level, CBC, s/s of bleed F/U after cath today  Elenor Quinones, PharmD, BCPS Clinical Pharmacist Pager [phone] 12/13/2015 12:40 PM

## 2015-12-14 ENCOUNTER — Inpatient Hospital Stay (HOSPITAL_COMMUNITY): Payer: Managed Care, Other (non HMO)

## 2015-12-14 ENCOUNTER — Encounter (HOSPITAL_COMMUNITY): Payer: Self-pay | Admitting: Cardiovascular Disease

## 2015-12-14 DIAGNOSIS — Z5181 Encounter for therapeutic drug level monitoring: Secondary | ICD-10-CM

## 2015-12-14 DIAGNOSIS — I482 Chronic atrial fibrillation: Secondary | ICD-10-CM

## 2015-12-14 DIAGNOSIS — Z7901 Long term (current) use of anticoagulants: Secondary | ICD-10-CM

## 2015-12-14 DIAGNOSIS — I2510 Atherosclerotic heart disease of native coronary artery without angina pectoris: Secondary | ICD-10-CM

## 2015-12-14 DIAGNOSIS — I200 Unstable angina: Secondary | ICD-10-CM

## 2015-12-14 DIAGNOSIS — I209 Angina pectoris, unspecified: Secondary | ICD-10-CM

## 2015-12-14 DIAGNOSIS — I428 Other cardiomyopathies: Secondary | ICD-10-CM

## 2015-12-14 LAB — BASIC METABOLIC PANEL
ANION GAP: 5 (ref 5–15)
BUN: 12 mg/dL (ref 6–20)
CALCIUM: 7.9 mg/dL — AB (ref 8.9–10.3)
CO2: 21 mmol/L — ABNORMAL LOW (ref 22–32)
Chloride: 109 mmol/L (ref 101–111)
Creatinine, Ser: 1.18 mg/dL (ref 0.61–1.24)
Glucose, Bld: 109 mg/dL — ABNORMAL HIGH (ref 65–99)
POTASSIUM: 4.1 mmol/L (ref 3.5–5.1)
Sodium: 135 mmol/L (ref 135–145)

## 2015-12-14 LAB — LIPID PANEL
CHOL/HDL RATIO: 2.4 ratio
CHOLESTEROL: 102 mg/dL (ref 0–200)
HDL: 42 mg/dL (ref >40)
LDL CALC: 47 mg/dL (ref 0–99)
TRIGLYCERIDES: 66 mg/dL (ref <150)
VLDL: 13 mg/dL (ref 0–40)

## 2015-12-14 LAB — CBC
HCT: 41.8 % (ref 39.0–52.0)
Hemoglobin: 13.6 g/dL (ref 13.0–17.0)
MCH: 32.4 pg (ref 26.0–34.0)
MCHC: 32.5 g/dL (ref 30.0–36.0)
MCV: 99.5 fL (ref 78.0–100.0)
PLATELETS: 206 10*3/uL (ref 150–400)
RBC: 4.2 MIL/uL — ABNORMAL LOW (ref 4.22–5.81)
RDW: 13.5 % (ref 11.5–15.5)
WBC: 7.7 10*3/uL (ref 4.0–10.5)

## 2015-12-14 LAB — D-DIMER, QUANTITATIVE: D-Dimer, Quant: 0.55 ug/mL-FEU — ABNORMAL HIGH (ref 0.00–0.50)

## 2015-12-14 LAB — ECHOCARDIOGRAM COMPLETE
HEIGHTINCHES: 68 in
Weight: 2292.78 oz

## 2015-12-14 MED ORDER — ISOSORBIDE MONONITRATE ER 30 MG PO TB24
15.0000 mg | ORAL_TABLET | Freq: Every day | ORAL | Status: DC
  Administered 2015-12-14 – 2015-12-15 (×2): 15 mg via ORAL
  Filled 2015-12-14 (×2): qty 1

## 2015-12-14 MED ORDER — RIVAROXABAN 20 MG PO TABS
20.0000 mg | ORAL_TABLET | Freq: Every day | ORAL | Status: DC
  Administered 2015-12-15: 20 mg via ORAL
  Filled 2015-12-14: qty 1

## 2015-12-14 MED ORDER — ALUM & MAG HYDROXIDE-SIMETH 200-200-20 MG/5ML PO SUSP
30.0000 mL | ORAL | Status: DC | PRN
  Administered 2015-12-14: 30 mL via ORAL
  Filled 2015-12-14: qty 30

## 2015-12-14 MED ORDER — SPIRONOLACTONE 25 MG PO TABS
12.5000 mg | ORAL_TABLET | Freq: Every day | ORAL | Status: DC
  Administered 2015-12-14 – 2015-12-15 (×2): 12.5 mg via ORAL
  Filled 2015-12-14 (×2): qty 1

## 2015-12-14 MED ORDER — RIVAROXABAN 20 MG PO TABS
20.0000 mg | ORAL_TABLET | Freq: Every day | ORAL | Status: AC
  Administered 2015-12-14: 20 mg via ORAL
  Filled 2015-12-14: qty 1

## 2015-12-14 MED FILL — Heparin Sodium (Porcine) 100 Unt/ML in Sodium Chloride 0.45%: INTRAMUSCULAR | Qty: 250 | Status: AC

## 2015-12-14 NOTE — Care Management Note (Signed)
Case Management Note  Patient Details  Name: Inderpreet Pomrenke. MRN: [id number] Date of Birth: [birth year]  Subjective/Objective:      Adm w mi              Action/Plan: lives w wife, supp son   Expected Discharge Date:  12/17/15               Expected Discharge Plan:  Home/Self Care  In-House Referral:     Discharge planning Services  CM Consult, Medication Assistance  Post Acute Care Choice:    Choice offered to:     DME Arranged:    DME Agency:     HH Arranged:    HH Agency:     Status of Service:  In process, will continue to follow  If discussed at Long Length of Stay Meetings, dates discussed:    Additional Comments: gave pt 30day free and 0.00 copay card for xarelto. md note states may need lifevest. zoll lifevest form on shadow chart for md to sign. Lacretia Leigh, RN 12/14/2015, 10:30 AM

## 2015-12-14 NOTE — Progress Notes (Signed)
  Echocardiogram 2D Echocardiogram has been performed.  [name] 12/14/2015, 8:58 AM

## 2015-12-14 NOTE — Progress Notes (Signed)
Subjective:  No chest pain; became very anxious last night  Objective:   Vital Signs : Vitals:   12/14/15 0408 12/14/15 0500 12/14/15 0600 12/14/15 0700  BP:  102/66 98/68 106/67  Pulse:  64 (!) 54 65  Resp:  19 16 (!) 22  Temp: 98.2 F (36.8 C)     TempSrc: Oral     SpO2:  98% 98% 98%  Weight:  143 lb 4.8 oz (65 kg)    Height:        Intake/Output from previous day:  Intake/Output Summary (Last 24 hours) at 12/14/15 0807 Last data filed at 12/14/15 0700  Gross per 24 hour  Intake            515.4 ml  Output              250 ml  Net            265.4 ml    I/O since admission: +265  Wt Readings from Last 3 Encounters:  12/14/15 143 lb 4.8 oz (65 kg)  07/07/15 147 lb (66.7 kg)  11/17/14 150 lb (68 kg)    Medications: . aspirin  81 mg Oral Daily  . aspirin EC  81 mg Oral Daily  . lisinopril  20 mg Oral Daily  . pantoprazole  40 mg Oral BID  . sodium chloride flush  3 mL Intravenous Q12H    . heparin 800 Units/hr (12/14/15 0322)    Physical Exam:   General appearance: alert, cooperative and anxious Neck: no adenopathy, no carotid bruit, no JVD, supple, symmetrical, trachea midline and thyroid not enlarged, symmetric, no tenderness/mass/nodules Lungs: clear to auscultation bilaterally Heart: irregular, irregular rhythm rate 60; 1/6 sem Abdomen: soft, non-tender; bowel sounds normal; no masses,  no organomegaly Extremities: no edema, redness or tenderness in the calves or thighs Pulses: 2+ and symmetric Skin: Skin color, texture, turgor normal. No rashes or lesions Neurologic: Grossly normal   Rate: 55- 65  Rhythm: AF  ECG (independently read by me): AF at 54 ; diffuse deepT inversion V3-6; QTC 504 msec  12/13/15 ECG (independently read by me): AF at 58, QS V1-3 with mild ST changes anterolaterally    Lab Results:   Recent Labs  12/13/15 1256 12/14/15 0154  NA 140 135  K 4.4 4.1  CL 109 109  CO2 21* 21*  GLUCOSE 91 109*  BUN 9 12    CREATININE 1.26* 1.18  CALCIUM 8.4* 7.9*    Hepatic Function Latest Ref Rng & Units 01/24/2012  Total Protein 6.0 - 8.3 g/dL 6.6  Albumin 3.5 - 5.2 g/dL 3.6  AST 0 - 37 U/L 24  ALT 0 - 53 U/L 18  Alk Phosphatase 39 - 117 U/L 45  Total Bilirubin 0.3 - 1.2 mg/dL 0.8  Bilirubin, Direct 0.0 - 0.3 mg/dL 0.1     Recent Labs  12/13/15 1256 12/14/15 0154  WBC 7.8 7.7  HGB 14.6 13.6  HCT 44.8 41.8  MCV 99.6 99.5  PLT 225 206     Recent Labs  12/13/15 1256  TROPONINI 0.31*    No results found for: TSH No results for input(s): HGBA1C in the last 72 hours.  No results for input(s): PROT, ALBUMIN, AST, ALT, ALKPHOS, BILITOT, BILIDIR, IBILI in the last 72 hours.  Recent Labs  12/13/15 1256  INR 1.08   BNP (last 3 results) No results for input(s): BNP in the last 8760 hours.  ProBNP (last 3 results) No results for  input(s): PROBNP in the last 8760 hours.   Lipid Panel     Component Value Date/Time   CHOL 102 12/14/2015 0154   TRIG 66 12/14/2015 0154   HDL 42 12/14/2015 0154   CHOLHDL 2.4 12/14/2015 0154   VLDL 13 12/14/2015 0154   LDLCALC 47 12/14/2015 0154      Imaging:  Cardiac Cath Conclusion:    Ost RCA to Prox RCA lesion, 40 %stenosed.  Ost 1st Diag to 1st Diag lesion, 60 %stenosed.  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  LV end diastolic pressure is moderately elevated.  The left ventricular ejection fraction is less than 25% by visual estimate.       Assessment/Plan:   Principal Problem:   Chest pain Active Problems:   PAF (paroxysmal atrial fibrillation) (HCC)   Bradycardia   Ischemic chest pain (HCC)  1. 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  I have reviewed images. LV dysfunction out of proportion to mostly nonobstructive CAD 2. AF  Rate now 62 - 65; He has permanent AF but has previously not been on anticoagulation. Heparin started post cath. Rate controlled;  3. Anticoagulation: discussed  need for RX with permanent AF and severe LV dysfunction. Son states that his dad is not very compliant with meds and once a day dosing is preferrable.  Will start Xarelto at 20 mg daily with Cr Cl > 60. 4. HTN 5. Anxiety: according to on patient has had stress with estate issues with his sister. Now on PRN xanax, will decrease to 0.25.  Will dc NTG; start Imdur at 15 mg and titrate. Add spironolactone at 12.5 mg. Continue lisinopril. Ideally start coreg but has had issues with bradycardia so will not start today. Will need lifevest pre-discharge for several months; hopefully LV fxn will improve. Echo today.     Troy Sine, MD, [facility] 12/14/2015, 8:07 AM

## 2015-12-14 NOTE — Discharge Instructions (Addendum)

## 2015-12-15 ENCOUNTER — Inpatient Hospital Stay (HOSPITAL_COMMUNITY): Payer: Managed Care, Other (non HMO)

## 2015-12-15 DIAGNOSIS — I5181 Takotsubo syndrome: Secondary | ICD-10-CM

## 2015-12-15 DIAGNOSIS — R0781 Pleurodynia: Secondary | ICD-10-CM

## 2015-12-15 LAB — BASIC METABOLIC PANEL
ANION GAP: 8 (ref 5–15)
BUN: 13 mg/dL (ref 6–20)
CHLORIDE: 106 mmol/L (ref 101–111)
CO2: 24 mmol/L (ref 22–32)
CREATININE: 1.29 mg/dL — AB (ref 0.61–1.24)
Calcium: 8.2 mg/dL — ABNORMAL LOW (ref 8.9–10.3)
GFR calc non Af Amer: 55 mL/min — ABNORMAL LOW (ref >60)
Glucose, Bld: 96 mg/dL (ref 65–99)
Potassium: 4 mmol/L (ref 3.5–5.1)
SODIUM: 138 mmol/L (ref 135–145)

## 2015-12-15 LAB — CBC
HCT: 40.6 % (ref 39.0–52.0)
HEMOGLOBIN: 13.2 g/dL (ref 13.0–17.0)
MCH: 32.2 pg (ref 26.0–34.0)
MCHC: 32.5 g/dL (ref 30.0–36.0)
MCV: 99 fL (ref 78.0–100.0)
PLATELETS: 198 10*3/uL (ref 150–400)
RBC: 4.1 MIL/uL — AB (ref 4.22–5.81)
RDW: 13.3 % (ref 11.5–15.5)
WBC: 7.7 10*3/uL (ref 4.0–10.5)

## 2015-12-15 LAB — BRAIN NATRIURETIC PEPTIDE: B NATRIURETIC PEPTIDE 5: 500 pg/mL — AB (ref 0.0–100.0)

## 2015-12-15 MED ORDER — SPIRONOLACTONE 25 MG PO TABS
12.5000 mg | ORAL_TABLET | Freq: Two times a day (BID) | ORAL | Status: DC
  Administered 2015-12-15 – 2015-12-16 (×2): 12.5 mg via ORAL
  Filled 2015-12-15 (×2): qty 1

## 2015-12-15 MED ORDER — TECHNETIUM TC 99M DIETHYLENETRIAME-PENTAACETIC ACID: 31.2000 | Freq: Once | INTRAVENOUS | Status: DC | PRN

## 2015-12-15 MED ORDER — TECHNETIUM TO 99M ALBUMIN AGGREGATED
4.1000 | Freq: Once | INTRAVENOUS | Status: AC | PRN
  Administered 2015-12-15: 4 via INTRAVENOUS

## 2015-12-15 MED ORDER — ISOSORBIDE MONONITRATE ER 30 MG PO TB24
30.0000 mg | ORAL_TABLET | Freq: Every day | ORAL | Status: DC
  Administered 2015-12-16: 30 mg via ORAL
  Filled 2015-12-15: qty 1

## 2015-12-15 NOTE — Progress Notes (Signed)
Subjective:  Still c/o disconfort with trying to take a deep breath  Objective:   Vital Signs : Vitals:   12/15/15 0000 12/15/15 0400 12/15/15 0500 12/15/15 0800  BP: 117/77 122/75    Pulse:      Resp: (!) 21     Temp: 98 F (36.7 C) 98.3 F (36.8 C)  98.6 F (37 C)  TempSrc: Oral Oral  Oral  SpO2: 99% 98%    Weight:   141 lb 15.6 oz (64.4 kg)   Height:        Intake/Output from previous day:  Intake/Output Summary (Last 24 hours) at 12/15/15 0812 Last data filed at 12/15/15 0600  Gross per 24 hour  Intake              378 ml  Output             1250 ml  Net             -872 ml    I/O since admission: -472  Wt Readings from Last 3 Encounters:  12/15/15 141 lb 15.6 oz (64.4 kg)  07/07/15 147 lb (66.7 kg)  11/17/14 150 lb (68 kg)    Medications: . aspirin EC  81 mg Oral Daily  . isosorbide mononitrate  15 mg Oral Daily  . lisinopril  20 mg Oral Daily  . pantoprazole  40 mg Oral BID  . rivaroxaban  20 mg Oral Q supper  . sodium chloride flush  3 mL Intravenous Q12H  . spironolactone  12.5 mg Oral Daily       Physical Exam:   General appearance: alert, cooperative and anxious Neck: no adenopathy, no carotid bruit, no JVD, supple, symmetrical, trachea midline and thyroid not enlarged, symmetric, no tenderness/mass/nodules Lungs: clear to auscultation bilaterally Heart: irregular, irregular rhythm rate 60; 1/6 sem Abdomen: soft, non-tender; bowel sounds normal; no masses,  no organomegaly Extremities: no edema, redness or tenderness in the calves or thighs Pulses: 2+ and symmetric Skin: Skin color, texture, turgor normal. No rashes or lesions Neurologic: Grossly normal   Rate: 55- 60  Rhythm: AF  12/15/15 ECG (independently read by me):  12/14/15 ECG (independently read by me): AF at 54 ; diffuse deepT inversion V3-6; QTC 504 msec  12/13/15 ECG (independently read by me): AF at 58, QS V1-3 with mild ST changes anterolaterally    Lab  Results:   Recent Labs  12/13/15 1256 12/14/15 0154 12/15/15 0148  NA 140 135 138  K 4.4 4.1 4.0  CL 109 109 106  CO2 21* 21* 24  GLUCOSE 91 109* 96  BUN 9 12 13   CREATININE 1.26* 1.18 1.29*  CALCIUM 8.4* 7.9* 8.2*    Hepatic Function Latest Ref Rng & Units 01/24/2012  Total Protein 6.0 - 8.3 g/dL 6.6  Albumin 3.5 - 5.2 g/dL 3.6  AST 0 - 37 U/L 24  ALT 0 - 53 U/L 18  Alk Phosphatase 39 - 117 U/L 45  Total Bilirubin 0.3 - 1.2 mg/dL 0.8  Bilirubin, Direct 0.0 - 0.3 mg/dL 0.1     Recent Labs  12/13/15 1256 12/14/15 0154 12/15/15 0148  WBC 7.8 7.7 7.7  HGB 14.6 13.6 13.2  HCT 44.8 41.8 40.6  MCV 99.6 99.5 99.0  PLT 225 206 198     Recent Labs  12/13/15 1256  TROPONINI 0.31*    D-Dimer 0.55   No results found for: TSH No results for input(s): HGBA1C in the last 72 hours.  No  results for input(s): PROT, ALBUMIN, AST, ALT, ALKPHOS, BILITOT, BILIDIR, IBILI in the last 72 hours.  Recent Labs  12/13/15 1256  INR 1.08   BNP (last 3 results) No results for input(s): BNP in the last 8760 hours.  ProBNP (last 3 results) No results for input(s): PROBNP in the last 8760 hours.   Lipid Panel     Component Value Date/Time   CHOL 102 12/14/2015 0154   TRIG 66 12/14/2015 0154   HDL 42 12/14/2015 0154   CHOLHDL 2.4 12/14/2015 0154   VLDL 13 12/14/2015 0154   LDLCALC 47 12/14/2015 0154      Imaging:  12/13/2015 Cardiac Cath Conclusion:    Ost RCA to Prox RCA lesion, 40 %stenosed.  Ost 1st Diag to 1st Diag lesion, 60 %stenosed.  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  LV end diastolic pressure is moderately elevated.  The left ventricular ejection fraction is less than 25% by visual estimate.     ------------------------------------------------------------------- 12/14/15 ECHO Study Conclusions  - Left ventricle: The cavity size was normal. Systolic function was   mildly to moderately reduced. The estimated ejection fraction  was   in the range of 40% to 45%. There is akinesis of the   apicalanterior myocardium. There is akinesis of the mid   anteroseptal and apical septal myocardium. The study was not   technically sufficient to allow evaluation of LV diastolic   dysfunction due to atrial fibrillation. - Mitral valve: There was mild regurgitation. - Left atrium: The atrium was mildly dilated.  CXR Study Result   CLINICAL DATA:  Center chest pain that extends into the throat. This is a chronic issue that the pt states he has been dealing with for 2 years. Pt denies SOB. Hx of HTN, PAF, Barrett's esophagus, sinus bradycardia. Nonsmoker.  EXAM: CHEST  2 VIEW  COMPARISON:  12/12/2015  FINDINGS: Midline trachea. Borderline cardiomegaly. Atherosclerosis in the transverse aorta. Trace left pleural fluid is new. No pneumothorax. Pulmonary interstitial prominence is at least partially due to low lung volumes. Subsegmental atelectasis at the left lung base.  IMPRESSION: Trace left pleural fluid.  Adjacent left base atelectasis.  Aortic atherosclerosis.      Assessment/Plan:   Principal Problem:   Chest pain Active Problems:   PAF (paroxysmal atrial fibrillation) (HCC)   Bradycardia   Ischemic chest pain (HCC)  1. 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  I have reviewed images and agree LV dysfunction out of proportion to mostly nonobstructive CAD. Cath EF 25% with apical ballooning corroborated by my review. ; echo EF ?40 -45% will personally review images/ ? if apex not well seen.   2. AF  Rate now 74 - 2; He has permanent AF but has previously not been on anticoagulation. Heparin started post cath. Rate controlled;  Now on xarelto. May ultimately try coreg but with bradycardia wilol not start presently.   3. Anticoagulation: discussed need for RX with permanent AF and severe LV dysfunction. Son states that his dad is not very compliant with meds and once a day dosing is  preferrable.   Xarelto started yesterday at 20 mg daily with Cr Cl > 60.  4. HTN controlled   5. Anxiety: according to on patient has had stress with estate issues with his sister. Now on PRN xanax,   6. Minimal d-dimer elevation; ? pleuritc discomfort, atelectaasis on cxr; no pericardial effusion.  Will get VQ scan today.  Plan: titrate imdur to 30 mg;  Check  BNP, ESR.  Increase spironolactone to  12.5 mg bid;  Continue lisinopril. Ideally start coreg but has had issues with bradycardia so will not start today. Will personally review echo to assess discordance with cath EF.  Suspect may  need lifevest pre-discharge for several months; hopefully LV fxn will improve.    Troy Sine, MD, [facility] 12/15/2015, 8:12 AM

## 2015-12-16 ENCOUNTER — Telehealth: Payer: Self-pay | Admitting: Cardiovascular Disease

## 2015-12-16 DIAGNOSIS — R7989 Other specified abnormal findings of blood chemistry: Secondary | ICD-10-CM

## 2015-12-16 DIAGNOSIS — I428 Other cardiomyopathies: Secondary | ICD-10-CM

## 2015-12-16 DIAGNOSIS — I4821 Permanent atrial fibrillation: Secondary | ICD-10-CM

## 2015-12-16 DIAGNOSIS — Z7901 Long term (current) use of anticoagulants: Secondary | ICD-10-CM

## 2015-12-16 LAB — CBC
HEMATOCRIT: 41.8 % (ref 39.0–52.0)
HEMOGLOBIN: 14 g/dL (ref 13.0–17.0)
MCH: 32.5 pg (ref 26.0–34.0)
MCHC: 33.5 g/dL (ref 30.0–36.0)
MCV: 97 fL (ref 78.0–100.0)
Platelets: 213 10*3/uL (ref 150–400)
RBC: 4.31 MIL/uL (ref 4.22–5.81)
RDW: 13 % (ref 11.5–15.5)
WBC: 10.7 10*3/uL — ABNORMAL HIGH (ref 4.0–10.5)

## 2015-12-16 LAB — BASIC METABOLIC PANEL
Anion gap: 9 (ref 5–15)
BUN: 14 mg/dL (ref 6–20)
CALCIUM: 8.5 mg/dL — AB (ref 8.9–10.3)
CO2: 23 mmol/L (ref 22–32)
Chloride: 104 mmol/L (ref 101–111)
Creatinine, Ser: 1.32 mg/dL — ABNORMAL HIGH (ref 0.61–1.24)
GFR calc Af Amer: >60 mL/min (ref >60)
GFR, EST NON AFRICAN AMERICAN: 54 mL/min — AB (ref >60)
GLUCOSE: 110 mg/dL — AB (ref 65–99)
Potassium: 3.7 mmol/L (ref 3.5–5.1)
Sodium: 136 mmol/L (ref 135–145)

## 2015-12-16 LAB — SEDIMENTATION RATE: SED RATE: 40 mm/h — AB (ref 0–16)

## 2015-12-16 MED ORDER — SPIRONOLACTONE 25 MG PO TABS: 12.5000 mg | ORAL_TABLET | Freq: Two times a day (BID) | ORAL | 11 refills | Status: DC

## 2015-12-16 MED ORDER — CARVEDILOL 3.125 MG PO TABS
1.5600 mg | ORAL_TABLET | Freq: Two times a day (BID) | ORAL | Status: DC
  Administered 2015-12-16: 1.56 mg via ORAL
  Filled 2015-12-16: qty 1

## 2015-12-16 MED ORDER — CARVEDILOL 3.125 MG PO TABS: 1.5600 mg | ORAL_TABLET | Freq: Two times a day (BID) | ORAL | 3 refills | Status: DC

## 2015-12-16 MED ORDER — ISOSORBIDE MONONITRATE ER 30 MG PO TB24
30.0000 mg | ORAL_TABLET | Freq: Every day | ORAL | 11 refills | Status: DC
Start: 2015-12-17 — End: 2015-12-22

## 2015-12-16 MED ORDER — RIVAROXABAN 20 MG PO TABS: 20.0000 mg | ORAL_TABLET | Freq: Every day | ORAL | 11 refills | Status: DC

## 2015-12-16 NOTE — Telephone Encounter (Signed)
New Message  TCM appt with Almyra Deforest on 12/22/2015, @ 2 pm from [facility] from hospital.

## 2015-12-16 NOTE — Progress Notes (Signed)
Subjective:  Still mildc/o disconfort with trying to take a deep breath but better; feels well to want to go home today  Objective:   Vital Signs : Vitals:   12/15/15 1551 12/15/15 1940 12/16/15 0006 12/16/15 0400  BP: 131/72 (!) 145/88 (!) 141/81 137/76  Pulse: 62   (!) 59  Resp: 14 17 (!) 26 (!) 22  Temp: 98.5 F (36.9 C) 98.9 F (37.2 C) 98.1 F (36.7 C) 98.2 F (36.8 C)  TempSrc: Oral Oral Oral Oral  SpO2: 99% 99% 99% 100%  Weight:    142 lb 3.2 oz (64.5 kg)  Height:        Intake/Output from previous day:  Intake/Output Summary (Last 24 hours) at 12/16/15 0807 Last data filed at 12/16/15 0300  Gross per 24 hour  Intake              760 ml  Output             1175 ml  Net             -415 ml    I/O since admission: -887  Wt Readings from Last 3 Encounters:  12/16/15 142 lb 3.2 oz (64.5 kg)  07/07/15 147 lb (66.7 kg)  11/17/14 150 lb (68 kg)    Medications: . aspirin EC  81 mg Oral Daily  . isosorbide mononitrate  30 mg Oral Daily  . lisinopril  20 mg Oral Daily  . pantoprazole  40 mg Oral BID  . rivaroxaban  20 mg Oral Q supper  . sodium chloride flush  3 mL Intravenous Q12H  . spironolactone  12.5 mg Oral BID       Physical Exam:   General appearance: alert, cooperative and anxious Neck: no adenopathy, no carotid bruit, no JVD, supple, symmetrical, trachea midline and thyroid not enlarged, symmetric, no tenderness/mass/nodules Lungs: clear to auscultation bilaterally Heart: irregular, irregular rhythm rate 60; 1/6 sem Abdomen: soft, non-tender; bowel sounds normal; no masses,  no organomegaly Extremities: no edema, redness or tenderness in the calves or thighs Pulses: 2+ and symmetric Skin: Skin color, texture, turgor normal. No rashes or lesions Neurologic: Grossly normal   Rate: 60-70  Rhythm: AF  ECG (independently read by me): AF 62, T changes similar  12/15/15 ECG (independently read by me): AF at 63; T changes persist but are less  from 10/3  12/14/15 ECG (independently read by me): AF at 54 ; diffuse deepT inversion V3-6; QTC 504 msec  12/13/15 ECG (independently read by me): AF at 58, QS V1-3 with mild ST changes anterolaterally    Lab Results:   Recent Labs  12/14/15 0154 12/15/15 0148 12/16/15 0204  NA 135 138 136  K 4.1 4.0 3.7  CL 109 106 104  CO2 21* 24 23  GLUCOSE 109* 96 110*  BUN 12 13 14   CREATININE 1.18 1.29* 1.32*  CALCIUM 7.9* 8.2* 8.5*    Hepatic Function Latest Ref Rng & Units 01/24/2012  Total Protein 6.0 - 8.3 g/dL 6.6  Albumin 3.5 - 5.2 g/dL 3.6  AST 0 - 37 U/L 24  ALT 0 - 53 U/L 18  Alk Phosphatase 39 - 117 U/L 45  Total Bilirubin 0.3 - 1.2 mg/dL 0.8  Bilirubin, Direct 0.0 - 0.3 mg/dL 0.1     Recent Labs  12/14/15 0154 12/15/15 0148 12/16/15 0204  WBC 7.7 7.7 10.7*  HGB 13.6 13.2 14.0  HCT 41.8 40.6 41.8  MCV 99.5 99.0 97.0  PLT 206  198 213     Recent Labs  12/13/15 1256  TROPONINI 0.31*    D-Dimer 0.55   No results found for: TSH No results for input(s): HGBA1C in the last 72 hours.  No results for input(s): PROT, ALBUMIN, AST, ALT, ALKPHOS, BILITOT, BILIDIR, IBILI in the last 72 hours.  Recent Labs  12/13/15 1256  INR 1.08   BNP (last 3 results)  Recent Labs  12/15/15 0930  BNP 500.0*    ProBNP (last 3 results) No results for input(s): PROBNP in the last 8760 hours.   ESR 40  Lipid Panel     Component Value Date/Time   CHOL 102 12/14/2015 0154   TRIG 66 12/14/2015 0154   HDL 42 12/14/2015 0154   CHOLHDL 2.4 12/14/2015 0154   VLDL 13 12/14/2015 0154   LDLCALC 47 12/14/2015 0154      Imaging:  12/13/2015 Cardiac Cath Conclusion:    Ost RCA to Prox RCA lesion, 40 %stenosed.  Ost 1st Diag to 1st Diag lesion, 60 %stenosed.  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  LV end diastolic pressure is moderately elevated.  The left ventricular ejection fraction is less than 25% by visual estimate.      ------------------------------------------------------------------- 12/14/15 ECHO Study Conclusions  - Left ventricle: The cavity size was normal. Systolic function was   mildly to moderately reduced. The estimated ejection fraction was   in the range of 40% to 45%. There is akinesis of the   apicalanterior myocardium. There is akinesis of the mid   anteroseptal and apical septal myocardium. The study was not   technically sufficient to allow evaluation of LV diastolic   dysfunction due to atrial fibrillation. - Mitral valve: There was mild regurgitation. - Left atrium: The atrium was mildly dilated.  CXR Study Result   CLINICAL DATA:  Center chest pain that extends into the throat. This is a chronic issue that the pt states he has been dealing with for 2 years. Pt denies SOB. Hx of HTN, PAF, Barrett's esophagus, sinus bradycardia. Nonsmoker.  EXAM: CHEST  2 VIEW  COMPARISON:  12/12/2015  FINDINGS: Midline trachea. Borderline cardiomegaly. Atherosclerosis in the transverse aorta. Trace left pleural fluid is new. No pneumothorax. Pulmonary interstitial prominence is at least partially due to low lung volumes. Subsegmental atelectasis at the left lung base.  IMPRESSION: Trace left pleural fluid.  Adjacent left base atelectasis.  Aortic atherosclerosis.      Assessment/Plan:   Principal Problem:   Chest pain Active Problems:   PAF (paroxysmal atrial fibrillation) (HCC)   Bradycardia   Ischemic chest pain (HCC)  1. 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  I have reviewed images and agree LV dysfunction out of proportion to mostly nonobstructive CAD. Cath EF 25% with apical ballooning corroborated by my review. ; echo EF ?40 -45%.  I personally reviewed echo with Dr. Marlou Porch. EF is probably 35% to 40 range.  BNP yesterday 500 c/w CHF.  2. AF  Rate now 60- 70; He has permanent AF but has previously not been on anticoagulation. Heparin started  post cath. Rate controlled;  Now on xarelto. Will try adding coreg 1.56 mg bid.  3. Anticoagulation: discussed need for RX with permanent AF and severe LV dysfunction. Son states that his dad is not very compliant with meds and once a day dosing is preferrable.   Xarelto started  at 20 mg daily;  Cr today 1.32 GFR 54; need close monitoring of renal fxn since if  GFR consistently < 50 will need to reduce dose to 15 mg.   4. HTN controlled   5. Anxiety: according to on patient has had stress with estate issues with his sister. Now on PRN xanax,   6. Minimal d-dimer elevation; ? pleuritc discomfort, atelectaasis on cxr; no pericardial effusion.  VQ scan is normal.   Feels much better. Tolerating Imdur 30 mg, lisinopril 20 mg, spironolactone 12.5 mg bid. Will try coreg at 1.56 bid.  Ambulate this am. Pt wanting to go home late; will re-asses and if tolerates possible dc later today or in am tomorrow.   Troy Sine, MD, [facility] 12/16/2015, 8:07 AM

## 2015-12-16 NOTE — Discharge Summary (Signed)
Discharge Summary    Patient ID: Brent Okelley.,  MRN: [id number], DOB/AGE: 1945/05/25 70 y.o.  Admit date: 12/13/2015 Discharge date: 12/16/2015  Primary Care Provider: Alanda Amass Primary Cardiologist: Dr. Domenic Polite (patient prefers [name]) EP:  [name]   Discharge Diagnoses    Principal Problem:   Chest pain Active Problems:   PAF (paroxysmal atrial fibrillation) (HCC)   Bradycardia   Ischemic chest pain (HCC)   D-dimer, elevated   Permanent atrial fibrillation (HCC)   NICM (nonischemic cardiomyopathy) (HCC)   Anticoagulated  Allergies Allergies  Allergen Reactions  . Flomax [Tamsulosin Hcl] Other (See Comments)    Itch, rash/hives  . Statins Other (See Comments)    Leg cramps    Diagnostic Studies/Procedures    Left Heart Cath and Coronary Angiography   12/16/15  Conclusion     Ost RCA to Prox RCA lesion, 40 %stenosed.  Ost 1st Diag to 1st Diag lesion, 60 %stenosed.  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  LV end diastolic pressure is moderately elevated.  The left ventricular ejection fraction is less than 25% by visual estimate.   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  ------------------------------------------------------------------- History:   PMH:   Chest pain.  Atrial fibrillation.  Risk factors: Hypertension. Dyslipidemia.  ------------------------------------------------------------------- Study Conclusions  - Left ventricle: The cavity size was normal. Systolic function was   mildly to moderately reduced. The estimated ejection fraction was   in the range of 40% to 45%. There is akinesis of the   apicalanterior myocardium. There is akinesis of the mid   anteroseptal and apical septal myocardium. The study was not   technically sufficient to allow evaluation of LV diastolic   dysfunction  due to atrial fibrillation. - Mitral valve: There was mild regurgitation. - Left atrium: The atrium was mildly dilated.   History of Present Illness     Brent Ray. is a 70 y.o. 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   The patient reports he has been experiencing worsening fatigue since last fall. Notes occasional lightheadedness and dizziness. HR at home varies from 30's - 50's. Has occasional episodes of palpitations during the night. Over the past few weeks, he notes worsening dyspnea with exertion. Says this can occur throughout the day and improves with rest. Has been experiencing chest pain with exertion for the past several weeks as well but notes yesterday it was the most severe. He worked throughout the day without any issues, but says upon driving home he developed significant chest discomfort while walking down his driveway. He took SL NTG with minimal relief. The pain persisted and was more intense than "any previous episodes in the past", therefore he went to [facility] for further observation.   While at [facility], labs showed a WBC of 6.3, Hgb 15.3, and platelets 255. K+ 3.2. Creatinine 1.13. Initial troponin < 0.01 but second value elevated at 0.19. CXR with chronic bronchitic changes in the lungs but no evidence of active pulmonary disease. EKG shows atrial fibrillation, HR 64, with ST depression in lateral leads.  He continues to have mild chest discomfort at this time. Is currently on Heparin drip and  has a NTG patch in place. Breathing is at baseline. Denies any lightheadedness or dizziness. Is in atrial fibrillation on telemetry with pauses up to 2.2 seconds noted and HR mostly in the 50's.   Last ischemic  evaluation was a cardiac catheterization at [facility] in 08/2014 which showed 40% ostial first diagonal stenosis, 30% circumflex stenosis, and 20% RCA stenosis. Had cath here in 08/2012 which showed nonobstructive CAD.   Hospital Course     Consultants: None  His symptoms was concerning for unstable angina and plan made for cath that showed non obstructive CAD with LV EF of 25%.   1. 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 Cath EF 25% with apical ballooning corroborated by Dr. Evette Georges review. Echos showed  EF of  40 -45%. [name] reviewed echo with Dr. Marlou Porch. EF is probably 35% to 40 range.  BNP 500 c/w CHF. Intolerance to statin.  2. AF:   Rate in  60- 70; He has permanent AF but has previously not been on anticoagulation. Heparin started post cath. Rate controlled;  Now on xarelto. Added coreg 1.56 mg bid.  3. Anticoagulation: Discussed need for RX with permanent AF and severe LV dysfunction. Son states that his dad is not very compliant with meds and once a day dosing is preferrable.   Xarelto started  at 20 mg daily;  Cr today 1.32 GFR 54; need close monitoring of renal fxn since if GFR consistently < 50 will need to reduce dose to 15 mg.   4. HTN controlled   5. Anxiety: according to on patient has had stress with estate issues with his sister. Now on PRN xanax,   6. Minimal d-dimer elevation: ? pleuritc discomfort, atelectaasis on cxr; no pericardial effusion.  VQ scan is normal.   7.  Bradycardia: History of HR in the 30.  Occasional pauses up to 2.2 seconds noted while in atrial fibrillation. - followed by [name] as an outpatient. Low dose coreg started at day of discharge by [name]. No significant pause on telemetry. Advised to keep eye on HR. Ambulated without dizziness, cp or dyspnea prior to discharge.   The patient has been seen by Dr.  Claiborne Billings  today and deemed ready for  discharge home. All follow-up appointments have been scheduled. Discharge medications are listed below.   Pt. Of Dr. Domenic Polite however patient prefers to followed by [name]. BMET during TCM.   Discharge Vitals Blood pressure 138/82, pulse 60, temperature 98.5 F (36.9 C), temperature source Oral, resp. rate (!) 22, height 5\' 8"  (1.727 m), weight 142 lb 3.2 oz (64.5 kg), SpO2 95 %.  Filed Weights   12/14/15 0500 12/15/15 0500 12/16/15 0400  Weight: 143 lb 4.8 oz (65 kg) 141 lb 15.6 oz (64.4 kg) 142 lb 3.2 oz (64.5 kg)    Labs & Radiologic Studies     CBC  Recent Labs  12/15/15 0148 12/16/15 0204  WBC 7.7 10.7*  HGB 13.2 14.0  HCT 40.6 41.8  MCV 99.0 97.0  PLT 198 [id number]   Basic Metabolic Panel  Recent Labs  12/15/15 0148 12/16/15 0204  NA 138 136  K 4.0 3.7  CL 106 104  CO2 24 23  GLUCOSE 96 110*  BUN 13 14  CREATININE 1.29* 1.32*  CALCIUM 8.2* 8.5*   Liver Function Tests No results for input(s): AST, ALT, ALKPHOS, BILITOT, PROT, ALBUMIN in the last 72 hours. No  results for input(s): LIPASE, AMYLASE in the last 72 hours. Cardiac Enzymes No results for input(s): CKTOTAL, CKMB, CKMBINDEX, TROPONINI in the last 72 hours. BNP Invalid input(s): POCBNP D-Dimer  Recent Labs  12/14/15 1035  DDIMER 0.55*   Hemoglobin A1C No results for input(s): HGBA1C in the last 72 hours. Fasting Lipid Panel  Recent Labs  12/14/15 0154  CHOL 102  HDL 42  LDLCALC 47  TRIG 66  CHOLHDL 2.4   Thyroid Function Tests No results for input(s): TSH, T4TOTAL, T3FREE, THYROIDAB in the last 72 hours.  Invalid input(s): FREET3  Dg Chest 2 View  Result Date: 12/14/2015 CLINICAL DATA:  Center chest pain that extends into the throat. This is a chronic issue that the pt states he has been dealing with for 2 years. Pt denies SOB. Hx of HTN, PAF, Barrett's esophagus, sinus bradycardia. Nonsmoker. EXAM: CHEST  2 VIEW COMPARISON:  12/12/2015 FINDINGS: Midline trachea. Borderline  cardiomegaly. Atherosclerosis in the transverse aorta. Trace left pleural fluid is new. No pneumothorax. Pulmonary interstitial prominence is at least partially due to low lung volumes. Subsegmental atelectasis at the left lung base. IMPRESSION: Trace left pleural fluid.  Adjacent left base atelectasis. Aortic atherosclerosis. Electronically Signed   By: Abigail Miyamoto M.D.   On: 12/14/2015 11:44   Nm Pulmonary Perf And Vent  Result Date: 12/15/2015 CLINICAL DATA:  Pleuritic chest pain, elevated D-dimer, history coronary artery disease, benign essential hypertension EXAM: NUCLEAR MEDICINE VENTILATION - PERFUSION LUNG SCAN TECHNIQUE: Ventilation images were obtained in multiple projections using inhaled aerosol Tc-72m DTPA. Perfusion images were obtained in multiple projections after intravenous injection of Tc-34m MAA. RADIOPHARMACEUTICALS:  31.2 mCi Technetium-58m DTPA aerosol inhalation and 4.1 mCi Technetium-85m MAA IV COMPARISON:  None; correlation chest radiograph 12/14/2015 FINDINGS: Ventilation: Normal Perfusion: Normal IMPRESSION: Normal ventilation and perfusion lung scan. Electronically Signed   By: Lavonia Dana M.D.   On: 12/15/2015 10:36    Disposition   Pt is being discharged home today in good condition.  Follow-up Plans & Appointments    Follow-up Information    [place], [place]. Go on 12/22/2015.   Specialties:  Cardiology, Radiology Why:  @ 2pm for TCM Contact information: [address] [place] [address] [phone]          Discharge Instructions    Call MD for:  persistant dizziness or light-headedness    Complete by:  As directed    Call MD for:  redness, tenderness, or signs of infection (pain, swelling, redness, odor or green/yellow discharge around incision site)    Complete by:  As directed    Diet - low sodium heart healthy    Complete by:  As directed    Discharge instructions    Complete by:  As directed    NO HEAVY LIFTING (>10lbs) X 2  WEEKS. NO SEXUAL ACTIVITY X 2 WEEKS. NO DRIVING X 1 WEEK. NO SOAKING BATHS, HOT TUBS, POOLS, ETC., X 7 DAYS.   Increase activity slowly    Complete by:  As directed       Discharge Medications   Current Discharge Medication List    START taking these medications   Details  carvedilol (COREG) 3.125 MG tablet Take 0.5 tablets (1.56 mg total) by mouth 2 (two) times daily with a meal. Qty: 30 tablet, Refills: 3    isosorbide mononitrate (IMDUR) 30 MG 24 hr tablet Take 1 tablet (30 mg total) by mouth daily. Qty: 30 tablet, Refills: 11    rivaroxaban (XARELTO)  20 MG TABS tablet Take 1 tablet (20 mg total) by mouth daily with supper. Qty: 30 tablet, Refills: 11    spironolactone (ALDACTONE) 25 MG tablet Take 0.5 tablets (12.5 mg total) by mouth 2 (two) times daily. Qty: 30 tablet, Refills: 11      CONTINUE these medications which have NOT CHANGED   Details  aspirin 81 MG tablet Take 81 mg by mouth daily.    Cranberry 400 MG CAPS Take 4,200 mg by mouth daily.    Glucosamine-MSM-Hyaluronic Acd (JOINT HEALTH PO) Take 1 tablet by mouth daily.    lisinopril (PRINIVIL,ZESTRIL) 20 MG tablet Take 1 tablet (20 mg total) by mouth daily. Qty: 90 tablet, Refills: 3    NITROSTAT 0.4 MG SL tablet DISSOLVE 1 TABLET UNDER TONGUE EVERY 5 MINUTES AS NEEDED FOR CHEST PAIN Qty: 25 tablet, Refills: 3    pantoprazole (PROTONIX) 40 MG tablet TAKE 1 TABLET TWICE A DAY Qty: 180 tablet, Refills: 3    vitamin B-12 (CYANOCOBALAMIN) 1000 MCG tablet Take 1,500 mcg by mouth daily.         Aspirin prescribed at discharge?  Yes High Intensity Statin Prescribed? (Lipitor 40-80mg  or Crestor 20-40mg ): intolerance to statin Beta Blocker Prescribed? Yes For EF 45% or less, Was ACEI/ARB Prescribed? Yes ADP Receptor Inhibitor Prescribed? (i.e. Plavix etc.-Includes Medically Managed Patients): N/A For EF <40%, Aldosterone Inhibitor Prescribed? Yes Was EF assessed during THIS hospitalization? Yes Was Cardiac  Rehab II ordered? (Included Medically managed Patients): N/A   Outstanding Labs/Studies   Echo in 3 months and BMET during TCM  Duration of Discharge Encounter   Greater than 30 minutes including physician time.  Signed, Darion Milewski PA-C 12/16/2015, 2:13 PM

## 2015-12-17 ENCOUNTER — Telehealth: Payer: Self-pay | Admitting: Pediatrics

## 2015-12-17 NOTE — Telephone Encounter (Signed)
I advised son we will call with decision [phone].

## 2015-12-17 NOTE — Telephone Encounter (Signed)
Pt's son states his Father has been "hateful and not easy to get along with" since not drinking ethol and alprazolam was stopped at hospital discharge. He is requesting we send rx for alprazolam to pharmacy for patient.  According to medication history he was taking Alprazolam 0.5mg  TID PRN while admitted.  Pt's son states he does not have a PCP.  Please advise. Thanks!

## 2015-12-17 NOTE — Telephone Encounter (Signed)
I do not prescribe anxiolytic medications. He does have a PCP listed in the chart. I suspect that he was getting the medication filled through his PCP.

## 2015-12-17 NOTE — Telephone Encounter (Deleted)
Pt's son states his Father has been "hateful and not easy to get along with" since not drinking ethol and alprazolam was stopped at hospital discharge. He is requesting we send rx for alprazolam to pharmacy for patient.  According to medication history he was taking Alprazolam 0.5mg  TID PRN while admitted.  Please advise.

## 2015-12-17 NOTE — Telephone Encounter (Signed)
I spoke with pt's son Brent Ray and wife Brent Ray in regards to patient.  They are his around the clock caregivers.  Patient contacted regarding discharge from [facility] on 12/16/2015.  Patient's caregiver understands to follow up with provider Almyra Deforest, PA-C on 12/22/2015 at 14:00 at [facility].  Patient's caregiver understands discharge instructions?  Pt's son states they understand.  Patient's caregiver understands medications and regiment? Pt's son states they understand.  Patient understands to bring all medications to this visit? Pt's son states they understand.  Pt's son states his Father has been "hateful and not easy to get along with" since not drinking ethol and alprazolam was stopped at hospital discharge. He is requesting we send rx for alprazolam to pharmacy for patient. Please see other phone note.

## 2015-12-17 NOTE — Telephone Encounter (Signed)
Pt's son was advised and voiced understanding.

## 2015-12-22 ENCOUNTER — Encounter: Payer: Self-pay | Admitting: Physician Assistant

## 2015-12-22 ENCOUNTER — Ambulatory Visit (INDEPENDENT_AMBULATORY_CARE_PROVIDER_SITE_OTHER): Payer: Managed Care, Other (non HMO) | Admitting: Physician Assistant

## 2015-12-22 VITALS — BP 128/70 | HR 43 | Ht 68.0 in | Wt 143.8 lb

## 2015-12-22 DIAGNOSIS — I2510 Atherosclerotic heart disease of native coronary artery without angina pectoris: Secondary | ICD-10-CM | POA: Diagnosis not present

## 2015-12-22 DIAGNOSIS — I482 Chronic atrial fibrillation, unspecified: Secondary | ICD-10-CM

## 2015-12-22 DIAGNOSIS — R001 Bradycardia, unspecified: Secondary | ICD-10-CM

## 2015-12-22 DIAGNOSIS — I428 Other cardiomyopathies: Secondary | ICD-10-CM

## 2015-12-22 DIAGNOSIS — I10 Essential (primary) hypertension: Secondary | ICD-10-CM | POA: Diagnosis not present

## 2015-12-22 MED ORDER — SPIRONOLACTONE 25 MG PO TABS: 12.5000 mg | ORAL_TABLET | Freq: Two times a day (BID) | ORAL | 0 refills | Status: DC

## 2015-12-22 MED ORDER — HYDRALAZINE HCL 25 MG PO TABS: 25.0000 mg | ORAL_TABLET | Freq: Three times a day (TID) | ORAL | 5 refills | Status: DC

## 2015-12-22 MED ORDER — HYDRALAZINE HCL 10 MG PO TABS: 10.0000 mg | ORAL_TABLET | Freq: Three times a day (TID) | ORAL | 5 refills | Status: DC

## 2015-12-22 MED ORDER — ISOSORBIDE MONONITRATE ER 30 MG PO TB24: 30.0000 mg | ORAL_TABLET | Freq: Every day | ORAL | 0 refills | Status: DC

## 2015-12-22 NOTE — Patient Instructions (Addendum)
Our recommendation today: 1. Given slow heart rate, we will discontinue carverdilol (also known as coreg).  2. Start Imdur (isosorbide mononitrate) 30mg  daily (should have sent to mail in pharmacy, will also give paper Rx 30 day supply while waiting for mail in pharmacy) 3. Start spironolactone (aldactone) 12.5mg  BID (should have sent to mail in pharmacy, will also give paper Rx 30 day supply while waiting for mail in pharmacy) 4. Check your BP daily about 2 hours after morning meds, if SBP (the upper number, not the lower number) is above 100, then start 10mg  hydralazine 3 times daily 5. Check basic metabolic panel in local lab in 1 week after starting spironolactone and have result faxed to [place], attention: Almyra Deforest PA-C or Dr. Shelva Majestic  Your physician recommends that you schedule a follow-up appointment in:  [place] for consideration for pacemaker 2 MONTHS WITH DR Claiborne Billings

## 2015-12-22 NOTE — Progress Notes (Signed)
Cardiology Office Note    Date:  12/22/2015   ID:  Brent Cover., DOB [birth year], MRN [id number]  PCP:  Alanda Amass, MD  Cardiologist: [name] (previously Dr. Domenic Polite) EP: [name]  Chief Complaint  Patient presents with  . Transitions Of Care    seen for [name], 7 day TCM followup.    History of Present Illness:  Brent Purk. is a 70 y.o. 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 Previous ischemic evaluation included a catheterization at [facility] in 08/2014 which showed 40% ostial D1 stenosis, 30% left circumflex stenosis, 20% RCA stenosis. He also had a previous cardiac cath in June 2014 here that showed a nonobstructive CAD.   EKG on initial arrival showed atrial fibrillation, however rate controlled with heart rate in the 50s. His symptom was concerning for unstable angina and started with exertion and relieved with rest for 3 weeks and eventually occurred at rest. Second troponin did come back mildly elevated at 0.19. Chest x-ray showed chronic bronchitic changes in the lung but no evidence of active pulmonary disease. He underwent cardiac catheterization on the same day which showed 40% ostial RCA stenosis, 60% ostial D1 stenosis, severe LV dysfunction with EF less than 25% by visual estimate during cath. The degree of coronary CAD does not explain his LV dysfunction, therefore he was diagnosed with nonischemic myopathy with Takotsubo pattern. Echocardiogram obtained on 12/14/2015 showed EF 40-45%, akinesis in the apical anterior myocardium, akinesis in the mid anteroseptal and apical septal myocardium, mild MR. Echo was personally reviewed by [name] and Dr. Marlou Porch, who felt ejection fraction is probably around 35-40% range. His Xarelto  was started for his likely chronic atrial fibrillation. Lisinopril, Imdur and spironolactone was added. He is intolerant to statins. He did have minimally elevated d-dimer, however VQ scan was normal. Initially, it was hesitant to add beta blocker to this patient given history of bradycardia into the 30s and occasional pulses up to 2.2 second warning atrial fibrillation, prior to discharge a low-dose carvedilol was added given his LV dysfunction. He presents today for seven-day TCM follow-up.  Patient has been accompanied by his son and the wife. He has not started on carvedilol, Imdur, and spironolactone as all 3 medication were sent to mail in pharmacy is to have local pharmacy. He continued to have dizziness, sometimes he says he woke up in the middle of the night having cold sweats. He denies any obvious passing out spell. EKG obtained in the clinic today shows he is in sinus bradycardia with heart rate of 43. Family is very concerned about his slow heart rate. Fortunately, since he has not restarted on carvedilol, I have instructed him to discontinue carvedilol at this point. I have asked him to start on the Imdur 30 mg daily, I have sent a 30 day supply to use local pharmacy while he waits for the mail in pharmacy. His son does mention that he may have tried Imdur in the past and could not tolerate it. However patient does not remember that and is willing to give it a try. I have also asked him to start on the spironolactone as well. We explained that his LV function is down and he needs heart failure medication with goal of possibly improving his overall LV  function long-term. I have also given him a paper prescription for 10 mg 3 times a day of hydralazine with instruction to start in one week if his systolic blood pressures greater than 100 mmHg. We will arrange for him to follow-up with [name] in one month, follow-up with [name] in 2 month, and have a repeat echocardiogram in 3 month. He will  obtain a basic metabolic panel in one week after he starts back on the spironolactone. Given the severe bradycardia, and his symptom of dizziness, we believe he should not do strenuous activity going back to work for the time being. I will fill out his FMLA form.     Past Medical History:  Diagnosis Date  . Barrett's esophagus 2011   In [place]. Records in [place]  . Coronary atherosclerosis of native coronary artery    Nonobstructive  . Essential hypertension, benign   . Heart block AV first degree    Chronic  . Mixed hyperlipidemia   . NSVT (nonsustained ventricular tachycardia) (HCC)    Holter monitor, 2011; recommended RF ablation by [name] - never required  . PAF (paroxysmal atrial fibrillation) ([place])    Recently documented in [facility] June 2016  . Sinus bradycardia    Symptomatic, acebutolol discontinued    Past Surgical History:  Procedure Laterality Date  . CARDIAC CATHETERIZATION N/A 12/13/2015   Procedure: Left Heart Cath and Coronary Angiography;  Surgeon: Lorretta Harp, MD;  Location: [facility];  Service: Cardiovascular;  Laterality: N/A;  . COLONOSCOPY  2014   [place]--[name]  . ESOPHAGOGASTRODUODENOSCOPY  2014   [place]--[name]  . HERNIA REPAIR     2  . KIDNEY STONE SURGERY    . LEFT HEART CATHETERIZATION WITH CORONARY ANGIOGRAM N/A 08/20/2012   Procedure: LEFT HEART CATHETERIZATION WITH CORONARY ANGIOGRAM;  Surgeon: [name], MD;  Location: [facility] C[facility];  Service: Cardiovascular;  Laterality: N/A;  . Left leg surgery  1979    Current Medications: Outpatient Medications Prior to Visit  Medication Sig Dispense Refill  . aspirin 81 MG tablet Take 81 mg by mouth daily.    . Cranberry 400 MG CAPS Take 4,200 mg by mouth daily.    . Glucosamine-MSM-Hyaluronic Acd (JOINT HEALTH PO) Take 1 tablet by mouth daily.    [name] Kitchen lisinopril (PRINIVIL,ZESTRIL) 20 MG tablet Take 1 tablet (20 mg total) by mouth daily. 90 tablet 3  .  NITROSTAT 0.4 MG SL tablet DISSOLVE 1 TABLET UNDER TONGUE EVERY 5 MINUTES AS NEEDED FOR CHEST PAIN 25 tablet 3  . rivaroxaban (XARELTO) 20 MG TABS tablet Take 1 tablet (20 mg total) by mouth daily with supper. 30 tablet 11  . vitamin B-12 (CYANOCOBALAMIN) 1000 MCG tablet Take 1,500 mcg by mouth daily.    . carvedilol (COREG) 3.125 MG tablet Take 0.5 tablets (1.56 mg total) by mouth 2 (two) times daily with a meal. 30 tablet 3  . isosorbide mononitrate (IMDUR) 30 MG 24 hr tablet Take 1 tablet (30 mg total) by mouth daily. 30 tablet 11  . spironolactone (ALDACTONE) 25 MG tablet Take 0.5 tablets (12.5 mg total) by mouth 2 (two) times daily. 30 tablet 11  . pantoprazole (PROTONIX) 40 MG tablet TAKE 1 TABLET TWICE A DAY (Patient taking differently: Take 40 mg by mouth twice daily) 180 tablet 3   No facility-administered medications prior to visit.      Allergies:   Flomax [tamsulosin hcl] and Statins   Social History   Social  History  . Marital status: Married    Spouse name: N/A  . Number of children: 1  . Years of education: N/A   Occupational History  . 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 Smoking status: Never Smoker  . Smokeless tobacco: Never Used  . Alcohol use 1.2 oz/week    2 Cans of beer per week     Comment: Occasional beer  . Drug use: No  . Sexual activity: Yes    Partners: Female   Other Topics Concern  . None   Social History Narrative   Daily caffeine      Family History:  The patient's family history includes Colon cancer in his mother; Other in his sister, sister, sister, and son.   ROS:   Please see the history of present illness.    ROS All other systems reviewed and are negative.   PHYSICAL EXAM:   VS:  BP 128/70   Pulse (!) 43   Ht 5\' 8"  (1.727 m)   Wt 143 lb 12.8 oz (65.2 kg)   BMI 21.86 kg/m    GEN: Well nourished, well developed, in no acute distress  HEENT: normal  Neck: no JVD, carotid bruits, or  masses Cardiac: irregular, no murmurs, rubs, or gallops,no edema  Respiratory:  clear to auscultation bilaterally, normal work of breathing GI: soft, nontender, nondistended, + BS MS: no deformity or atrophy  Skin: warm and dry, no rash Neuro:  Alert and Oriented x 3, Strength and sensation are intact Psych: euthymic mood, full affect  Wt Readings from Last 3 Encounters:  12/22/15 143 lb 12.8 oz (65.2 kg)  12/16/15 142 lb 3.2 oz (64.5 kg)  07/07/15 147 lb (66.7 kg)      Studies/Labs Reviewed:   EKG:  EKG is ordered today.  The ekg ordered today demonstrates Atrial fibrillation with heart rate 43  Recent Labs: 12/15/2015: B Natriuretic Peptide 500.0 12/16/2015: BUN 14; Creatinine, Ser 1.32; Hemoglobin 14.0; Platelets 213; Potassium 3.7; Sodium 136   Lipid Panel    Component Value Date/Time   CHOL 102 12/14/2015 0154   TRIG 66 12/14/2015 0154   HDL 42 12/14/2015 0154   CHOLHDL 2.4 12/14/2015 0154   VLDL 13 12/14/2015 0154   LDLCALC 47 12/14/2015 0154    Additional studies/ records that were reviewed today include:   Cath 12/13/2015 Conclusion     Ost RCA to Prox RCA lesion, 40 %stenosed.  Ost 1st Diag to 1st Diag lesion, 60 %stenosed.  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  LV end diastolic pressure is moderately elevated.  The left ventricular ejection fraction is less than 25% by visual estimate.    Echo 12/14/2015 LV EF: 40% -   45% ------------------------------------------------------------------- Study Conclusions  - Left ventricle: The cavity size was normal. Systolic function was   mildly to moderately reduced. The estimated ejection fraction was   in the range of 40% to 45%. There is akinesis of the   apicalanterior myocardium. There is akinesis of the mid   anteroseptal and apical septal myocardium. The study was not   technically sufficient to allow evaluation of LV diastolic   dysfunction due to atrial fibrillation. - Mitral  valve: There was mild regurgitation. - Left atrium: The atrium was mildly dilated.    ASSESSMENT:    1. Nonischemic cardiomyopathy ([place])   2. Coronary artery disease involving native coronary artery of native heart without angina pectoris   3.  Chronic atrial fibrillation ([place])   4. Bradycardia   5. Essential hypertension      PLAN:  In order of problems listed above:  1. NICM: He had an nonischemic cardiomyopathy, cardiac catheterization did not reveal significant blockage. We plan to repeat a three-month echocardiogram to reassess his overall ejection fraction. During the meantime, he has been started on Imdur, lisinopril, spironolactone. He is not on carvedilol due to baseline bradycardia. We do plan to add hydralazine 10 mg 3 times a day in one week if his systolic blood pressure is stable.  2. Chronic atrial fibrillation with slow ventricular response  - This patients CHA2DS2-VASc Score and unadjusted Ischemic Stroke Rate (% per year) is equal to 4.8 % stroke rate/year from a score of 4  Above score calculated as 1 point each if present [CHF, HTN, DM, Vascular=MI/PAD/Aortic Plaque, Age if 65-74, or Male] Above score calculated as 2 points each if present [Age > 75, or Stroke/TIA/TE]  - He has slow ventricular response, he has chronic history of bradycardia, he has also had some dizziness but no feeling of passing out. His heart rate is 43 today while he is awake and talking in the office. He will need to be evaluated by electrophysiology service again to see if he needs a pacemaker or not. He likely has some degree of sick sinus syndrome.  3. CAD: 60% D1 and a 40% RCA disease unchanged when compared to the previous cath. Continue risk factor control.  4. HTN: Blood pressure is stable today, I have asked him to start on Imdur and spironolactone. If his blood pressure is stable in 1 week, we will plan to start on 3 times a day dosing of 10 mg hydralazine.    Medication  Adjustments/Labs and Tests Ordered: Current medicines are reviewed at length with the patient today.  Concerns regarding medicines are outlined above.  Medication changes, Labs and Tests ordered today are listed in the Patient Instructions below. Patient Instructions  Our recommendation today: 1. Given slow heart rate, we will discontinue carverdilol (also known as coreg).  2. Start Imdur (isosorbide mononitrate) 30mg  daily (should have sent to mail in pharmacy, will also give paper Rx 30 day supply while waiting for mail in pharmacy) 3. Start spironolactone (aldactone) 12.5mg  BID (should have sent to mail in pharmacy, will also give paper Rx 30 day supply while waiting for mail in pharmacy) 4. Check your BP daily about 2 hours after morning meds, if SBP (the upper number, not the lower number) is above 100, then start 10mg  hydralazine 3 times daily 5. Check basic metabolic panel in local lab in 1 week after starting spironolactone and have result faxed to [facility], attention: Almyra Deforest PA-C or Dr. Shelva Majestic  Your physician recommends that you schedule a follow-up appointment in:  [place] for consideration for pacemaker 2 MONTHS WITH DR Kandy Garrison, PA  12/22/2015 11:53 PM    [address], [place], [place]  [address] Phone: [phone]; Fax: [phone]

## 2015-12-31 ENCOUNTER — Other Ambulatory Visit: Payer: Self-pay | Admitting: Physician Assistant

## 2016-01-01 LAB — BASIC METABOLIC PANEL
BUN / CREAT RATIO: 7 — AB (ref 10–24)
BUN: 10 mg/dL (ref 8–27)
CO2: 25 mmol/L (ref 18–29)
CREATININE: 1.37 mg/dL — AB (ref 0.76–1.27)
Calcium: 9.2 mg/dL (ref 8.6–10.2)
Chloride: 103 mmol/L (ref 96–106)
GFR, EST AFRICAN AMERICAN: 61 mL/min/{1.73_m2} (ref >59)
GFR, EST NON AFRICAN AMERICAN: 53 mL/min/{1.73_m2} — AB (ref >59)
GLUCOSE: 85 mg/dL (ref 65–99)
Potassium: 4.7 mmol/L (ref 3.5–5.2)
SODIUM: 143 mmol/L (ref 134–144)

## 2016-01-01 LAB — AMBIG ABBREV BMP8 DEFAULT

## 2016-01-10 ENCOUNTER — Telehealth: Payer: Self-pay | Admitting: Physician Assistant

## 2016-01-10 NOTE — Telephone Encounter (Signed)
°*  STAT* If patient is at the pharmacy, call can be transferred to refill team.   1. Which medications need to be refilled? (please list name of each medication and dose if known) Xarelto  2. Which pharmacy/location (including street and city if local pharmacy) is medication to be sent to?[place]. 3. Do they need a 30 day or 90 day supply? [place]

## 2016-01-10 NOTE — Telephone Encounter (Signed)
Pt needs prior authorization for his Xarelto. Please call this to [facility].

## 2016-01-10 NOTE — Telephone Encounter (Signed)
Pt to be followed by [name]. Routed to [place] for review.

## 2016-01-11 NOTE — Telephone Encounter (Signed)
Information submitted through cover my meds, will wait for determination. Patient made aware

## 2016-01-11 NOTE — Telephone Encounter (Signed)
Mr. Eppinger is calling to get prior auth on his Xarelto  States . Please call at [phone]

## 2016-01-24 ENCOUNTER — Encounter: Payer: Self-pay | Admitting: Internal Medicine

## 2016-01-24 ENCOUNTER — Other Ambulatory Visit: Payer: Self-pay

## 2016-01-24 ENCOUNTER — Ambulatory Visit (INDEPENDENT_AMBULATORY_CARE_PROVIDER_SITE_OTHER): Payer: Managed Care, Other (non HMO) | Admitting: Internal Medicine

## 2016-01-24 ENCOUNTER — Telehealth: Payer: Self-pay

## 2016-01-24 VITALS — BP 144/80 | HR 51 | Ht 68.0 in | Wt 146.2 lb

## 2016-01-24 DIAGNOSIS — R001 Bradycardia, unspecified: Secondary | ICD-10-CM

## 2016-01-24 DIAGNOSIS — I480 Paroxysmal atrial fibrillation: Secondary | ICD-10-CM | POA: Diagnosis not present

## 2016-01-24 MED ORDER — RIVAROXABAN 20 MG PO TABS: 20.0000 mg | ORAL_TABLET | Freq: Every day | ORAL | 3 refills | Status: DC

## 2016-01-24 MED ORDER — RIVAROXABAN 20 MG PO TABS: 20.0000 mg | ORAL_TABLET | Freq: Every day | ORAL | 11 refills | Status: DC

## 2016-01-24 NOTE — Patient Instructions (Signed)
Medication Instructions:  Your physician has recommended you make the following change in your medication:  1) Stop Aspirin    Labwork: Your physician recommends that you return for lab work today: BMP/CBC   Testing/Procedures: Your physician has recommended that you have a pacemaker inserted. A pacemaker is a small device that is placed under the skin of your chest or abdomen to help control abnormal heart rhythms. This device uses electrical pulses to prompt the heart to beat at a normal rate. Pacemakers are used to treat heart rhythms that are too slow. Wire (leads) are attached to the pacemaker that goes into the chambers of you heart. This is done in the hospital and usually requires and overnight stay. Please see the instruction sheet given to you today for more information.---02/01/16   Please arrive at [facility] at 12 noon Okay to eat a lite breakfast nothing after 7:30am Okay to take medications the morning of the procedure with your breakfast HOLD Xarelto 2 days prior to procedure Use scrub according to directions Plan for one night stay--will need someone to drive at discharge     Follow-Up: Your physician recommends that you schedule a follow-up appointment in: 10-14 days from 02/01/16 in device clinic and 3 months from 02/01/16 with Dr [name]   Any Other Special Instructions Will Be Listed Below (If Applicable).     If you need a refill on your cardiac medications before your next appointment, please call your pharmacy.

## 2016-01-24 NOTE — Telephone Encounter (Signed)
Prior auth for Xarelto 20mg  submitted to [facility].

## 2016-01-25 ENCOUNTER — Telehealth: Payer: Self-pay

## 2016-01-25 LAB — CBC WITH DIFFERENTIAL/PLATELET
Basophils Absolute: 0 cells/uL (ref 0–200)
Basophils Relative: 0 %
EOS ABS: 120 {cells}/uL (ref 15–500)
Eosinophils Relative: 2 %
HEMATOCRIT: 39.3 % (ref 38.5–50.0)
Hemoglobin: 12.9 g/dL — ABNORMAL LOW (ref 13.2–17.1)
LYMPHS PCT: 21 %
Lymphs Abs: 1260 cells/uL (ref 850–3900)
MCH: 31.2 pg (ref 27.0–33.0)
MCHC: 32.8 g/dL (ref 32.0–36.0)
MCV: 94.9 fL (ref 80.0–100.0)
MONO ABS: 660 {cells}/uL (ref 200–950)
MONOS PCT: 11 %
MPV: 10.4 fL (ref 7.5–12.5)
NEUTROS PCT: 66 %
Neutro Abs: 3960 cells/uL (ref 1500–7800)
Platelets: 336 10*3/uL (ref 140–400)
RBC: 4.14 MIL/uL — ABNORMAL LOW (ref 4.20–5.80)
RDW: 13.3 % (ref 11.0–15.0)
WBC: 6 10*3/uL (ref 3.8–10.8)

## 2016-01-25 LAB — BASIC METABOLIC PANEL
BUN: 15 mg/dL (ref 7–25)
CHLORIDE: 107 mmol/L (ref 98–110)
CO2: 25 mmol/L (ref 20–31)
CREATININE: 1.33 mg/dL — AB (ref 0.70–1.25)
Calcium: 9 mg/dL (ref 8.6–10.3)
GLUCOSE: 81 mg/dL (ref 65–99)
POTASSIUM: 4.2 mmol/L (ref 3.5–5.3)
Sodium: 141 mmol/L (ref 135–146)

## 2016-01-25 NOTE — Telephone Encounter (Signed)
Xarelto 20mg  approved by [facility]. Effective through 01/23/2017. Case ID [id number]. Local pharmacy notified.

## 2016-01-30 NOTE — Progress Notes (Signed)
Primary Cardiologist:  Dr Brent Natter, MD is PCP  Brent Ray. is a 70 y.o. male who presents today for EP followup.  He has multiple chronic issues including fatigue, dizziness and atypical chest pain.  Prior cath revealed nonobstructive cad.  EF is 40-45%. He has persistent afib as well as bradycardia.  He "gives out" as the day goes on.  Today, he denies symptoms of palpitations,  shortness of breath,  lower extremity edema,  presyncope, or syncope.  The patient is otherwise without complaint today.   Past Medical History:  Diagnosis Date  . Barrett's esophagus 2011   In [place]. Records in [place]  . Coronary atherosclerosis of native coronary artery    Nonobstructive  . Essential hypertension, benign   . Heart block AV first degree    Chronic  . Mixed hyperlipidemia   . NSVT (nonsustained ventricular tachycardia) (HCC)    Holter monitor, 2011; recommended RF ablation by Brent Ray - never required  . PAF (paroxysmal atrial fibrillation) ([name])    Recently documented in [facility] June 2016  . Sinus bradycardia    Symptomatic, acebutolol discontinued   Past Surgical History:  Procedure Laterality Date  . CARDIAC CATHETERIZATION N/A 12/13/2015   Procedure: Left Heart Cath and Coronary Angiography;  Surgeon: Brent Harp, MD;  Location: [facility];  Service: Cardiovascular;  Laterality: N/A;  . COLONOSCOPY  2014   [place]. West Ray  . ESOPHAGOGASTRODUODENOSCOPY  2014   [place]--Dr. West Ray  . HERNIA REPAIR     2  . KIDNEY STONE SURGERY    . LEFT HEART CATHETERIZATION WITH CORONARY ANGIOGRAM N/A 08/20/2012   Procedure: LEFT HEART CATHETERIZATION WITH CORONARY ANGIOGRAM;  Surgeon: Brent Breeding, MD;  Location: [facility] CATH LAB;  Service: Cardiovascular;  Laterality: N/A;  . Left leg surgery  1979    Current Outpatient Prescriptions  Medication Sig Dispense Refill  . Cranberry 400 MG CAPS Take 4,200 mg by mouth daily.    .  Glucosamine-MSM-Hyaluronic Acd (JOINT HEALTH PO) Take 1 tablet by mouth daily.    . hydrALAZINE (APRESOLINE) 10 MG tablet Take 1 tablet (10 mg total) by mouth 3 (three) times daily. 90 tablet 5  . isosorbide mononitrate (IMDUR) 30 MG 24 hr tablet Take 1 tablet (30 mg total) by mouth daily. 30 tablet 0  . lisinopril (PRINIVIL,ZESTRIL) 20 MG tablet Take 1 tablet (20 mg total) by mouth daily. 90 tablet 3  . NITROSTAT 0.4 MG SL tablet DISSOLVE 1 TABLET UNDER TONGUE EVERY 5 MINUTES AS NEEDED FOR CHEST PAIN 25 tablet 3  . pantoprazole (PROTONIX) 40 MG tablet Take 40 mg by mouth 2 (two) times daily.    [name] Kitchen spironolactone (ALDACTONE) 25 MG tablet Take 0.5 tablets (12.5 mg total) by mouth 2 (two) times daily. 30 tablet 0  . vitamin B-12 (CYANOCOBALAMIN) 1000 MCG tablet Take 1,500 mcg by mouth daily.    [name] Kitchen aspirin 81 MG tablet Take 81 mg by mouth daily.    . carvedilol (COREG) 3.125 MG tablet Take 1.5625 mg by mouth 2 (two) times daily with a meal. Half tablet twice daily    . rivaroxaban (XARELTO) 20 MG TABS tablet Take 1 tablet (20 mg total) by mouth daily with supper. 30 tablet 11   No current facility-administered medications for this visit.     Physical Exam: Vitals:   01/24/16 1228  BP: (!) 144/80  Pulse: (!) 51  Weight: 146 lb 3.2 oz (66.3 kg)  Height: 5\' 8"  (  1.727 m)    GEN- The patient is well appearing, alert and oriented x 3 today.   Head- normocephalic, atraumatic Eyes-  Sclera clear, conjunctiva pink Ears- hearing intact Oropharynx- clear Lungs- Clear to ausculation bilaterally, normal work of breathing Heart- bradycardic irregular, no murmurs, rubs or gallops, PMI not laterally displaced GI- soft, NT, ND, + BS Extremities- no clubbing, cyanosis, or edema Psych- seems frustrated,  Does not make eye contact.  Son is with him and provides most of history.  Intersting dynamic between them.  ekg today reveals afib, V rate 50 bpm, QRS 102 msec, Qtc 425 msec Prior sleep study did not  reveal sleep apnea  Multiple records (cath, echo, Dr Brent Ray, event monitor) reviewed as above  Assessment and Plan:  1. Fatigue/ dizziness The patient has symptomatic bradycardia.  No reversible causes are found.  I would therefore recommend pacemaker implantation at this time.  Risks, benefits, alternatives to pacemaker implantation were discussed in detail with the patient today. The patient understands that the risks include but are not limited to bleeding, infection, pneumothorax, perforation, tamponade, vascular damage, renal failure, MI, stroke, death,  and lead dislodgement and wishes to proceed. We will therefore schedule the procedure at the next available time.  Will plan His Bundle pacing.  If unable to obtain adequate result, would likely proceed with CRT given reduced EF and anticipated V pacing. Check preoperative CBC/BMET Stop ASA  2. afib chads2vasc score is 3.  He is on xarelto Could consider AAD therapy after ppm is in place.  3. Nonischemic CM Consider His bundle pacing vs CRT-P as above bmet  Brent Grayer MD, [facility]

## 2016-02-01 ENCOUNTER — Encounter (HOSPITAL_COMMUNITY)
Admission: RE | Disposition: A | Discharge status: Home / Self Care | Payer: Self-pay | Source: Ambulatory Visit | Attending: Internal Medicine

## 2016-02-01 ENCOUNTER — Ambulatory Visit (HOSPITAL_COMMUNITY)
Admission: RE | Admit: 2016-02-01 | Discharge: 2016-02-02 | Disposition: A | Discharge status: Home / Self Care | Payer: Managed Care, Other (non HMO) | Source: Ambulatory Visit | Attending: Internal Medicine | Admitting: Internal Medicine

## 2016-02-01 DIAGNOSIS — I480 Paroxysmal atrial fibrillation: Secondary | ICD-10-CM | POA: Diagnosis not present

## 2016-02-01 DIAGNOSIS — I2510 Atherosclerotic heart disease of native coronary artery without angina pectoris: Secondary | ICD-10-CM | POA: Diagnosis not present

## 2016-02-01 DIAGNOSIS — I440 Atrioventricular block, first degree: Secondary | ICD-10-CM | POA: Diagnosis present

## 2016-02-01 DIAGNOSIS — E782 Mixed hyperlipidemia: Secondary | ICD-10-CM | POA: Insufficient documentation

## 2016-02-01 DIAGNOSIS — Z959 Presence of cardiac and vascular implant and graft, unspecified: Secondary | ICD-10-CM

## 2016-02-01 DIAGNOSIS — I10 Essential (primary) hypertension: Secondary | ICD-10-CM | POA: Insufficient documentation

## 2016-02-01 DIAGNOSIS — I481 Persistent atrial fibrillation: Secondary | ICD-10-CM | POA: Insufficient documentation

## 2016-02-01 DIAGNOSIS — I4821 Permanent atrial fibrillation: Secondary | ICD-10-CM | POA: Diagnosis present

## 2016-02-01 DIAGNOSIS — Z7901 Long term (current) use of anticoagulants: Secondary | ICD-10-CM | POA: Insufficient documentation

## 2016-02-01 DIAGNOSIS — I441 Atrioventricular block, second degree: Secondary | ICD-10-CM | POA: Diagnosis not present

## 2016-02-01 DIAGNOSIS — Z7982 Long term (current) use of aspirin: Secondary | ICD-10-CM | POA: Insufficient documentation

## 2016-02-01 DIAGNOSIS — R001 Bradycardia, unspecified: Secondary | ICD-10-CM | POA: Diagnosis present

## 2016-02-01 HISTORY — PX: EP IMPLANTABLE DEVICE: SHX172B

## 2016-02-01 LAB — SURGICAL PCR SCREEN
MRSA, PCR: NEGATIVE
Staphylococcus aureus: NEGATIVE

## 2016-02-01 SURGERY — PACEMAKER IMPLANT

## 2016-02-01 MED ORDER — FENTANYL CITRATE (PF) 100 MCG/2ML IJ SOLN
INTRAMUSCULAR | Status: DC | PRN
  Administered 2016-02-01: 12.5 ug via INTRAVENOUS
  Administered 2016-02-01: 25 ug via INTRAVENOUS

## 2016-02-01 MED ORDER — HEPARIN (PORCINE) IN NACL 2-0.9 UNIT/ML-% IJ SOLN
INTRAMUSCULAR | Status: DC | PRN
  Administered 2016-02-01: 15:00:00

## 2016-02-01 MED ORDER — HYDRALAZINE HCL 10 MG PO TABS
10.0000 mg | ORAL_TABLET | Freq: Three times a day (TID) | ORAL | Status: DC
  Administered 2016-02-01 – 2016-02-02 (×2): 10 mg via ORAL
  Filled 2016-02-01 (×2): qty 1

## 2016-02-01 MED ORDER — MUPIROCIN 2 % EX OINT
TOPICAL_OINTMENT | CUTANEOUS | Status: AC
  Administered 2016-02-01: 1
  Filled 2016-02-01: qty 22

## 2016-02-01 MED ORDER — SODIUM CHLORIDE 0.9 % IR SOLN
80.0000 mg | Status: AC
  Administered 2016-02-01: 80 mg

## 2016-02-01 MED ORDER — LISINOPRIL 20 MG PO TABS
20.0000 mg | ORAL_TABLET | Freq: Every day | ORAL | Status: DC
  Administered 2016-02-02: 20 mg via ORAL
  Filled 2016-02-01: qty 1

## 2016-02-01 MED ORDER — CHLORHEXIDINE GLUCONATE 4 % EX LIQD
60.0000 mL | Freq: Once | CUTANEOUS | Status: DC
  Filled 2016-02-01: qty 60

## 2016-02-01 MED ORDER — SODIUM CHLORIDE 0.9% FLUSH: 3.0000 mL | INTRAVENOUS | Status: DC | PRN

## 2016-02-01 MED ORDER — ONDANSETRON HCL 4 MG/2ML IJ SOLN: 4.0000 mg | Freq: Four times a day (QID) | INTRAMUSCULAR | Status: DC | PRN

## 2016-02-01 MED ORDER — SODIUM CHLORIDE 0.9 % IV SOLN: 250.0000 mL | INTRAVENOUS | Status: DC | PRN

## 2016-02-01 MED ORDER — CEFAZOLIN IN D5W 1 GM/50ML IV SOLN
1.0000 g | Freq: Four times a day (QID) | INTRAVENOUS | Status: AC
  Administered 2016-02-01 – 2016-02-02 (×3): 1 g via INTRAVENOUS
  Filled 2016-02-01 (×4): qty 50

## 2016-02-01 MED ORDER — LIDOCAINE HCL (PF) 1 % IJ SOLN
INTRAMUSCULAR | Status: DC | PRN
  Administered 2016-02-01: 45 mL

## 2016-02-01 MED ORDER — SODIUM CHLORIDE 0.9% FLUSH
3.0000 mL | Freq: Two times a day (BID) | INTRAVENOUS | Status: DC
  Administered 2016-02-01 – 2016-02-02 (×2): 3 mL via INTRAVENOUS

## 2016-02-01 MED ORDER — HYDROCODONE-ACETAMINOPHEN 5-325 MG PO TABS
1.0000 | ORAL_TABLET | ORAL | Status: DC | PRN
  Administered 2016-02-01: 1 via ORAL
  Administered 2016-02-02 (×2): 2 via ORAL
  Filled 2016-02-01 (×2): qty 2
  Filled 2016-02-01: qty 1

## 2016-02-01 MED ORDER — LIDOCAINE HCL (PF) 1 % IJ SOLN
INTRAMUSCULAR | Status: AC
  Filled 2016-02-01: qty 30

## 2016-02-01 MED ORDER — SPIRONOLACTONE 25 MG PO TABS
25.0000 mg | ORAL_TABLET | Freq: Every day | ORAL | Status: DC
  Administered 2016-02-02: 25 mg via ORAL
  Filled 2016-02-01: qty 1

## 2016-02-01 MED ORDER — IOPAMIDOL (ISOVUE-370) INJECTION 76%
INTRAVENOUS | Status: DC | PRN
  Administered 2016-02-01: 15 mL via INTRAVENOUS

## 2016-02-01 MED ORDER — CEFAZOLIN SODIUM-DEXTROSE 2-4 GM/100ML-% IV SOLN
2.0000 g | INTRAVENOUS | Status: AC
  Administered 2016-02-01: 2 g via INTRAVENOUS

## 2016-02-01 MED ORDER — ACETAMINOPHEN 325 MG PO TABS
325.0000 mg | ORAL_TABLET | ORAL | Status: DC | PRN
  Administered 2016-02-01: 325 mg via ORAL
  Filled 2016-02-01: qty 1

## 2016-02-01 MED ORDER — CEFAZOLIN SODIUM-DEXTROSE 2-4 GM/100ML-% IV SOLN
INTRAVENOUS | Status: AC
  Filled 2016-02-01: qty 100

## 2016-02-01 MED ORDER — PANTOPRAZOLE SODIUM 40 MG PO TBEC
40.0000 mg | DELAYED_RELEASE_TABLET | Freq: Two times a day (BID) | ORAL | Status: DC
  Administered 2016-02-01 – 2016-02-02 (×2): 40 mg via ORAL
  Filled 2016-02-01 (×2): qty 1

## 2016-02-01 MED ORDER — SODIUM CHLORIDE 0.9 % IV SOLN
INTRAVENOUS | Status: DC
Start: 2016-02-01 — End: 2016-02-01
  Administered 2016-02-01: 13:00:00 via INTRAVENOUS

## 2016-02-01 MED ORDER — FENTANYL CITRATE (PF) 100 MCG/2ML IJ SOLN
INTRAMUSCULAR | Status: AC
  Filled 2016-02-01: qty 2

## 2016-02-01 MED ORDER — MUPIROCIN 2 % EX OINT: 1.0000 "application " | TOPICAL_OINTMENT | Freq: Once | CUTANEOUS | Status: DC

## 2016-02-01 MED ORDER — MIDAZOLAM HCL 5 MG/5ML IJ SOLN
INTRAMUSCULAR | Status: AC
  Filled 2016-02-01: qty 5

## 2016-02-01 MED ORDER — MIDAZOLAM HCL 5 MG/5ML IJ SOLN
INTRAMUSCULAR | Status: DC | PRN
  Administered 2016-02-01 (×2): 1 mg via INTRAVENOUS

## 2016-02-01 MED ORDER — CARVEDILOL 3.125 MG PO TABS
1.5625 mg | ORAL_TABLET | Freq: Two times a day (BID) | ORAL | Status: DC
  Administered 2016-02-01 – 2016-02-02 (×2): 1.5625 mg via ORAL
  Filled 2016-02-01 (×2): qty 1

## 2016-02-01 MED ORDER — SODIUM CHLORIDE 0.9 % IR SOLN
Status: AC
  Filled 2016-02-01: qty 2

## 2016-02-01 SURGICAL SUPPLY — 11 items
CABLE SURGICAL S-101-97-12 (CABLE) ×3 IMPLANT
CATH RIGHTSITE C315HIS02 (CATHETERS) ×3 IMPLANT
LEAD CAPSURE NOVUS 5076-52CM (Lead) ×3 IMPLANT
LEAD SELECT SECURE 3830 383069 (Lead) ×1 IMPLANT
PAD DEFIB LIFELINK (PAD) ×3 IMPLANT
PPM ADVISA MRI DR A2DR01 (Pacemaker) ×3 IMPLANT
SELECT SECURE 3830 383069 (Lead) ×3 IMPLANT
SHEATH CLASSIC 7F (SHEATH) ×6 IMPLANT
SLITTER 6232ADJ (MISCELLANEOUS) ×3 IMPLANT
TRAY PACEMAKER INSERTION (PACKS) ×3 IMPLANT
WIRE GUIDERIGHT .032X180 (WIRE) ×3 IMPLANT

## 2016-02-01 NOTE — H&P (View-Only) (Signed)
Primary Cardiologist:  Brent Brent Natter, MD is PCP  Brent Ray. is a 70 y.o. male who presents today for EP followup.  He has multiple chronic issues including fatigue, dizziness and atypical chest pain.  Prior cath revealed nonobstructive cad.  EF is 40-45%. He has persistent afib as well as bradycardia.  He "gives out" as the day goes on.  Today, he denies symptoms of palpitations,  shortness of breath,  lower extremity edema,  presyncope, or syncope.  The patient is otherwise without complaint today.   Past Medical History:  Diagnosis Date  . Barrett's esophagus 2011   In [place]. Records in [place]  . Coronary atherosclerosis of native coronary artery    Nonobstructive  . Essential hypertension, benign   . Heart block AV first degree    Chronic  . Mixed hyperlipidemia   . NSVT (nonsustained ventricular tachycardia) (HCC)    Holter monitor, 2011; recommended RF ablation by Brent. Rayann Ray - never required  . PAF (paroxysmal atrial fibrillation) ([place])    Recently documented in [facility] June 2016  . Sinus bradycardia    Symptomatic, acebutolol discontinued   Past Surgical History:  Procedure Laterality Date  . CARDIAC CATHETERIZATION N/A 12/13/2015   Procedure: Left Heart Cath and Coronary Angiography;  Surgeon: Brent Harp, MD;  Location: [facility];  Service: Cardiovascular;  Laterality: N/A;  . COLONOSCOPY  2014   [place], [place]--[place]. [place] [facility]  . ESOPHAGOGASTRODUODENOSCOPY  2014   [place], [place]--[place]. [place] [facility]  . HERNIA REPAIR     2  . KIDNEY STONE SURGERY    . LEFT HEART CATHETERIZATION WITH CORONARY ANGIOGRAM N/A 08/20/2012   Procedure: LEFT HEART CATHETERIZATION WITH CORONARY ANGIOGRAM;  Surgeon: Brent Breeding, MD;  Location: [facility] CATH LAB;  Service: Cardiovascular;  Laterality: N/A;  . Left leg surgery  1979    Current Outpatient Prescriptions  Medication Sig Dispense Refill  . Cranberry 400 MG CAPS Take 4,200 mg by mouth daily.    .  Glucosamine-MSM-Hyaluronic Acd (JOINT HEALTH PO) Take 1 tablet by mouth daily.    . hydrALAZINE (APRESOLINE) 10 MG tablet Take 1 tablet (10 mg total) by mouth 3 (three) times daily. 90 tablet 5  . isosorbide mononitrate (IMDUR) 30 MG 24 hr tablet Take 1 tablet (30 mg total) by mouth daily. 30 tablet 0  . lisinopril (PRINIVIL,ZESTRIL) 20 MG tablet Take 1 tablet (20 mg total) by mouth daily. 90 tablet 3  . NITROSTAT 0.4 MG SL tablet DISSOLVE 1 TABLET UNDER TONGUE EVERY 5 MINUTES AS NEEDED FOR CHEST PAIN 25 tablet 3  . pantoprazole (PROTONIX) 40 MG tablet Take 40 mg by mouth 2 (two) times daily.    [name] Kitchen spironolactone (ALDACTONE) 25 MG tablet Take 0.5 tablets (12.5 mg total) by mouth 2 (two) times daily. 30 tablet 0  . vitamin B-12 (CYANOCOBALAMIN) 1000 MCG tablet Take 1,500 mcg by mouth daily.    [name] Kitchen aspirin 81 MG tablet Take 81 mg by mouth daily.    . carvedilol (COREG) 3.125 MG tablet Take 1.5625 mg by mouth 2 (two) times daily with a meal. Half tablet twice daily    . rivaroxaban (XARELTO) 20 MG TABS tablet Take 1 tablet (20 mg total) by mouth daily with supper. 30 tablet 11   No current facility-administered medications for this visit.     Physical Exam: Vitals:   01/24/16 1228  BP: (!) 144/80  Pulse: (!) 51  Weight: 146 lb 3.2 oz (66.3 kg)  Height: 5\' 8"  (  1.727 m)    GEN- The patient is well appearing, alert and oriented x 3 today.   Head- normocephalic, atraumatic Eyes-  Sclera clear, conjunctiva pink Ears- hearing intact Oropharynx- clear Lungs- Clear to ausculation bilaterally, normal work of breathing Heart- bradycardic irregular, no murmurs, rubs or gallops, PMI not laterally displaced GI- soft, NT, ND, + BS Extremities- no clubbing, cyanosis, or edema Psych- seems frustrated,  Does not make eye contact.  Son is with him and provides most of history.  Intersting dynamic between them.  ekg today reveals afib, V rate 50 bpm, QRS 102 msec, Qtc 425 msec Prior sleep study did not  reveal sleep apnea  Multiple records (cath, echo, Brent Ray, event monitor) reviewed as above  Assessment and Plan:  1. Fatigue/ dizziness The patient has symptomatic bradycardia.  No reversible causes are found.  I would therefore recommend pacemaker implantation at this time.  Risks, benefits, alternatives to pacemaker implantation were discussed in detail with the patient today. The patient understands that the risks include but are not limited to bleeding, infection, pneumothorax, perforation, tamponade, vascular damage, renal failure, MI, stroke, death,  and lead dislodgement and wishes to proceed. We will therefore schedule the procedure at the next available time.  Will plan His Bundle pacing.  If unable to obtain adequate result, would likely proceed with CRT given reduced EF and anticipated V pacing. Check preoperative CBC/BMET Stop ASA  2. afib chads2vasc score is 3.  He is on xarelto Could consider AAD therapy after ppm is in place.  3. Nonischemic CM Consider His bundle pacing vs CRT-P as above bmet  Brent Grayer MD, [facility]

## 2016-02-01 NOTE — Interval H&P Note (Signed)
History and Physical Interval Note:  02/01/2016 2:39 PM  Brent Ray.  has presented today for surgery, with the diagnosis of sss  The various methods of treatment have been discussed with the patient and family. After consideration of risks, benefits and other options for treatment, the patient has consented to  Procedure(s): Pacemaker Implant (N/A) as a surgical intervention .  Will attempt His Bundle pacing.  If unable to capture the His, will proceed with biventricular pacemaker implantation.  The patient's history has been reviewed, patient examined, no change in status, stable for surgery.  I have reviewed the patient's chart and labs.  Questions were answered to the patient's satisfaction.     Thompson Grayer

## 2016-02-01 NOTE — Progress Notes (Signed)
Pt. C/o shocking feeling to incision site. On call MD for cardiology made aware. Pt. In no distress. RN will administer pain medication as ordered and continued to monitor pt. For changes in condition.

## 2016-02-02 ENCOUNTER — Ambulatory Visit (HOSPITAL_COMMUNITY): Payer: Managed Care, Other (non HMO)

## 2016-02-02 ENCOUNTER — Encounter (HOSPITAL_COMMUNITY): Payer: Self-pay | Admitting: Internal Medicine

## 2016-02-02 DIAGNOSIS — I441 Atrioventricular block, second degree: Secondary | ICD-10-CM | POA: Diagnosis not present

## 2016-02-02 DIAGNOSIS — I481 Persistent atrial fibrillation: Secondary | ICD-10-CM | POA: Diagnosis not present

## 2016-02-02 LAB — BASIC METABOLIC PANEL
Anion gap: 9 (ref 5–15)
BUN: 13 mg/dL (ref 6–20)
CHLORIDE: 109 mmol/L (ref 101–111)
CO2: 21 mmol/L — ABNORMAL LOW (ref 22–32)
CREATININE: 1.24 mg/dL (ref 0.61–1.24)
Calcium: 8.7 mg/dL — ABNORMAL LOW (ref 8.9–10.3)
GFR calc non Af Amer: 58 mL/min — ABNORMAL LOW (ref >60)
Glucose, Bld: 75 mg/dL (ref 65–99)
POTASSIUM: 3.9 mmol/L (ref 3.5–5.1)
SODIUM: 139 mmol/L (ref 135–145)

## 2016-02-02 MED ORDER — SPIRONOLACTONE 25 MG PO TABS: 25.0000 mg | ORAL_TABLET | Freq: Every day | ORAL | Status: DC

## 2016-02-02 MED ORDER — RIVAROXABAN 20 MG PO TABS: 20.0000 mg | ORAL_TABLET | Freq: Every day | ORAL | 11 refills | Status: DC

## 2016-02-02 MED FILL — Gentamicin Sulfate Inj 40 MG/ML: INTRAMUSCULAR | Qty: 2 | Status: AC

## 2016-02-02 MED FILL — Sodium Chloride Irrigation Soln 0.9%: Qty: 500 | Status: AC

## 2016-02-02 NOTE — Progress Notes (Signed)
Brent Ray. to be D/C'd Home per MD order.  Discussed prescriptions and follow up appointments with the patient. Prescriptions given to patient, medication list explained in detail. Pt verbalized understanding.    Medication List    STOP taking these medications   aspirin 81 MG tablet     TAKE these medications   carvedilol 3.125 MG tablet Commonly known as:  COREG Take 1.5625 mg by mouth 2 (two) times daily with a meal. Half tablet twice daily   Cranberry 400 MG Caps Take 4,200 mg by mouth daily.   hydrALAZINE 10 MG tablet Commonly known as:  APRESOLINE Take 1 tablet (10 mg total) by mouth 3 (three) times daily.   isosorbide mononitrate 30 MG 24 hr tablet Commonly known as:  IMDUR Take 1 tablet (30 mg total) by mouth daily.   JOINT HEALTH PO Take 1 tablet by mouth daily.   lisinopril 20 MG tablet Commonly known as:  PRINIVIL,ZESTRIL Take 1 tablet (20 mg total) by mouth daily.   NITROSTAT 0.4 MG SL tablet Generic drug:  nitroGLYCERIN DISSOLVE 1 TABLET UNDER TONGUE EVERY 5 MINUTES AS NEEDED FOR CHEST PAIN   pantoprazole 40 MG tablet Commonly known as:  PROTONIX Take 40 mg by mouth 2 (two) times daily.   rivaroxaban 20 MG Tabs tablet Commonly known as:  XARELTO Take 1 tablet (20 mg total) by mouth daily with supper. Start taking on:  02/05/2016   spironolactone 25 MG tablet Commonly known as:  ALDACTONE Take 0.5 tablets (12.5 mg total) by mouth 2 (two) times daily. What changed:  Another medication with the same name was added. Make sure you understand how and when to take each.   spironolactone 25 MG tablet Commonly known as:  ALDACTONE Take 1 tablet (25 mg total) by mouth daily. What changed:  You were already taking a medication with the same name, and this prescription was added. Make sure you understand how and when to take each.   vitamin B-12 1000 MCG tablet Commonly known as:  CYANOCOBALAMIN Take 1,500 mcg by mouth daily.       Vitals:   02/02/16 0100 02/02/16 0543  BP: (!) 158/83 (!) 144/88  Pulse: 94 97  Resp: (!) 22 20  Temp: 97.8 F (36.6 C) 97.7 F (36.5 C)    Skin clean, dry and intact without evidence of skin break down, no evidence of skin tears noted. IV catheter discontinued intact. Site without signs and symptoms of complications. Dressing and pressure applied. Pt denies pain at this time. No complaints noted.  An After Visit Summary was printed and given to the patient. Patient escorted via [place], and D/C home via private auto.  Retta Mac BSN, RN

## 2016-02-02 NOTE — Discharge Instructions (Signed)
° ° °  Supplemental Discharge Instructions for  Pacemaker/Defibrillator Patients  Activity No heavy lifting or vigorous activity with your left/right arm for 6 to 8 weeks.  Do not raise your left/right arm above your head for one week.  Gradually raise your affected arm as drawn below.             02/05/16                  02/06/16                    02/07/16                02/08/16 __  NO DRIVING for 1 week   ; you may begin driving on [id number]    .  WOUND CARE - Keep the wound area clean and dry.  Do not get this area wet, no showers until cleared to at your wound check visit. - The tape/steri-strips on your wound will fall off; do not pull them off.  No bandage is needed on the site.  DO  NOT apply any creams, oils, or ointments to the wound area. - If you notice any drainage or discharge from the wound, any swelling or bruising at the site, or you develop a fever > 101? F after you are discharged home, call the office at once.  Special Instructions - You are still able to use cellular telephones; use the ear opposite the side where you have your pacemaker/defibrillator.  Avoid carrying your cellular phone near your device. - When traveling through airports, show security personnel your identification card to avoid being screened in the metal detectors.  Ask the security personnel to use the hand wand. - Avoid arc welding equipment, MRI testing (magnetic resonance imaging), TENS units (transcutaneous nerve stimulators).  Call the office for questions about other devices. - Avoid electrical appliances that are in poor condition or are not properly grounded. - Microwave ovens are safe to be near or to operate.

## 2016-02-02 NOTE — Discharge Summary (Signed)
ELECTROPHYSIOLOGY PROCEDURE DISCHARGE SUMMARY    Patient ID: Brent Crute.,  MRN: [id number], DOB/AGE: 1945/09/30 70 y.o.  Admit date: 02/01/2016 Discharge date: 02/02/2016  Primary Care Physician: Shelva Majestic, MD  Primary Cardiologist: Dr. Domenic Polite Electrophysiologist: Dr [name]  Primary Discharge Diagnosis:  1. Symptomatic bradycardia status post pacemaker implantation this admission  Secondary Discharge Diagnosis:  1. NICM 2. HTN 3. Persistent AFib     CHA2DS2Vasc is at least 3 on Xarelto   Allergies  Allergen Reactions  . Flomax [Tamsulosin Hcl] Other (See Comments)    Itch, rash/hives  . Statins Other (See Comments)    Leg cramps     Procedures This Admission:  1.  Implantation of a MDT dual chamber PPM on 02/01/16 by Dr [name].  The patient received a Medtronic Advisa model [id number] (serial number [id number]) MRI conditional pacemaker, withMedtronic model [id number] (serial number [id number]) right atrial lead and a Medtronic model [id number] (serial number [id number]) His bundle pacing lead  There were no immediate post procedure complications. 2.  CXR on discharged demonstrated no pneumothorax status post device implantation.   Brief HPI: Brent Sorkin. is a 70 y.o. male was referred to electrophysiology in the outpatient setting for consideration of PPM implantation.  Past medical history includes HTN, Persistent AFib, NICM.  The patient has had symptomatic bradycardia without reversible causes identified.  Risks, benefits, and alternatives to PPM implantation were reviewed with the patient who wished to proceed.   Hospital Course:  The patient was admitted and underwent implantation of a PPM with details as outlined above.  He was monitored on telemetry overnight which demonstrated afib with V pacing.  Left chest was without hematoma or ecchymosis.  The device was interrogated and found to be functioning normally.  CXR was obtained and demonstrated no  pneumothorax status post device implantation.  Wound care, arm mobility, and restrictions were reviewed with the patient.  The patient was examined by [name] and considered stable for discharge to home.  He does not tolerate pain very well but appeared to have typical post procedure findings.   Physical Exam: Vitals:   02/01/16 2315 02/02/16 0015 02/02/16 0100 02/02/16 0543  BP: 133/76 133/78 (!) 158/83 (!) 144/88  Pulse: 60 60 94 97  Resp:   (!) 22 20  Temp:   97.8 F (36.6 C) 97.7 F (36.5 C)  TempSrc:   Oral   SpO2:    97%  Weight:    137 lb 12.8 oz (62.5 kg)  Height:        GEN- The patient is well appearing, alert and oriented x 3 today.   HEENT: normocephalic, atraumatic; sclera clear, conjunctiva pink; hearing intact; oropharynx clear; neck supple, no JVP Lungs- Clear to ausculation bilaterally, normal work of breathing.  No wheezes, rales, rhonchi Heart- Regular rate and rhythm, no murmurs, rubs or gallops, PMI not laterally displaced GI- soft, non-tender, non-distended, bowel sounds present, no hepatosplenomegaly Extremities- no clubbing, cyanosis, or edema; DP/PT/radial pulses 2+ bilaterally MS- no significant deformity or atrophy Skin- warm and dry, no rash or lesion, left chest without hematoma/ecchymosis Psych- euthymic mood, full affect Neuro- no gross deficits   Labs:   Lab Results  Component Value Date   WBC 6.0 01/24/2016   HGB 12.9 (L) 01/24/2016   HCT 39.3 01/24/2016   MCV 94.9 01/24/2016   PLT 336 01/24/2016     Recent Labs Lab 02/02/16 0426  NA 139  K 3.9  CL  109  CO2 21*  BUN 13  CREATININE 1.24  CALCIUM 8.7*  GLUCOSE 75    Discharge Medications:    Medication List    STOP taking these medications   aspirin 81 MG tablet     TAKE these medications   carvedilol 3.125 MG tablet Commonly known as:  COREG Take 1.5625 mg by mouth 2 (two) times daily with a meal. Half tablet twice daily   Cranberry 400 MG Caps Take 4,200 mg by  mouth daily.   hydrALAZINE 10 MG tablet Commonly known as:  APRESOLINE Take 1 tablet (10 mg total) by mouth 3 (three) times daily.   isosorbide mononitrate 30 MG 24 hr tablet Commonly known as:  IMDUR Take 1 tablet (30 mg total) by mouth daily.   JOINT HEALTH PO Take 1 tablet by mouth daily.   lisinopril 20 MG tablet Commonly known as:  PRINIVIL,ZESTRIL Take 1 tablet (20 mg total) by mouth daily.   NITROSTAT 0.4 MG SL tablet Generic drug:  nitroGLYCERIN DISSOLVE 1 TABLET UNDER TONGUE EVERY 5 MINUTES AS NEEDED FOR CHEST PAIN   pantoprazole 40 MG tablet Commonly known as:  PROTONIX Take 40 mg by mouth 2 (two) times daily.   rivaroxaban 20 MG Tabs tablet Commonly known as:  XARELTO Take 1 tablet (20 mg total) by mouth daily with supper. Start taking on:  02/05/2016   spironolactone 25 MG tablet Commonly known as:  ALDACTONE Take 0.5 tablets (12.5 mg total) by mouth 2 (two) times daily. What changed:  Another medication with the same name was added. Make sure you understand how and when to take each.   spironolactone 25 MG tablet Commonly known as:  ALDACTONE Take 1 tablet (25 mg total) by mouth daily. What changed:  You were already taking a medication with the same name, and this prescription was added. Make sure you understand how and when to take each.   vitamin B-12 1000 MCG tablet Commonly known as:  CYANOCOBALAMIN Take 1,500 mcg by mouth daily.      Resume xarelto on 01/26/16  Disposition:  Home  Follow-up Information    Thompson Grayer, MD Follow up on 05/05/2016.   Specialty:  Cardiology Why:  10:00AM Contact information: [address] [place] [address] [phone]        Chanetta Marshall, NP Follow up on 02/16/2016.   Specialty:  Cardiology Why:  at 12:40PM for wound check  Contact information: [address] [place] [address] [phone]           Duration of Discharge Encounter: Greater than 30 minutes including  physician time.  SignedTommye Standard, PA-C 02/02/2016 8:17 AM  I have seen, examined the patient, and reviewed the above assessment and plan.  Device interrogation reviewed and normal.  CXR reveals stable leads, no ptx.  Changes to above are made where necessary.   DC to home. Co Sign: Thompson Grayer, MD 02/02/2016 8:18 AM

## 2016-02-11 ENCOUNTER — Ambulatory Visit (INDEPENDENT_AMBULATORY_CARE_PROVIDER_SITE_OTHER): Payer: Managed Care, Other (non HMO) | Admitting: Internal Medicine

## 2016-02-11 ENCOUNTER — Ambulatory Visit: Payer: Managed Care, Other (non HMO)

## 2016-02-11 DIAGNOSIS — I480 Paroxysmal atrial fibrillation: Secondary | ICD-10-CM

## 2016-02-11 NOTE — Progress Notes (Signed)
Patient was seen for wound check. His bundle pacing was assessed with loss of selective His bundle pacing at 2.25 V. The output was reprogrammed to 3.5 V

## 2016-02-14 LAB — CUP PACEART INCLINIC DEVICE CHECK
Battery Voltage: 3.12 V
Brady Statistic AP VP Percent: 0.38 %
Brady Statistic RA Percent Paced: 0.24 %
Brady Statistic RV Percent Paced: 97.89 %
Date Time Interrogation Session: 20171201172459
Implantable Lead Implant Date: 20171121
Implantable Pulse Generator Implant Date: 20171121
Lead Channel Impedance Value: 380 Ohm
Lead Channel Impedance Value: 456 Ohm
Lead Channel Pacing Threshold Amplitude: 0.75 V
Lead Channel Sensing Intrinsic Amplitude: 1.125 mV
Lead Channel Sensing Intrinsic Amplitude: 1.875 mV
MDC IDC LEAD IMPLANT DT: 20171121
MDC IDC LEAD LOCATION: 753859
MDC IDC LEAD LOCATION: 753860
MDC IDC MSMT LEADCHNL RA IMPEDANCE VALUE: 494 Ohm
MDC IDC MSMT LEADCHNL RV IMPEDANCE VALUE: 323 Ohm
MDC IDC MSMT LEADCHNL RV PACING THRESHOLD PULSEWIDTH: 0.4 ms
MDC IDC MSMT LEADCHNL RV SENSING INTR AMPL: 2 mV
MDC IDC MSMT LEADCHNL RV SENSING INTR AMPL: 2.75 mV
MDC IDC SET LEADCHNL RA PACING AMPLITUDE: 3.5 V
MDC IDC SET LEADCHNL RV PACING AMPLITUDE: 3.5 V
MDC IDC SET LEADCHNL RV PACING PULSEWIDTH: 1 ms
MDC IDC SET LEADCHNL RV SENSING SENSITIVITY: 0.9 mV
MDC IDC STAT BRADY AP VS PERCENT: 0 %
MDC IDC STAT BRADY AS VP PERCENT: 97.31 %
MDC IDC STAT BRADY AS VS PERCENT: 2.3 %

## 2016-02-16 ENCOUNTER — Encounter: Payer: Managed Care, Other (non HMO) | Admitting: Nurse Practitioner

## 2016-02-23 ENCOUNTER — Ambulatory Visit (INDEPENDENT_AMBULATORY_CARE_PROVIDER_SITE_OTHER): Payer: Managed Care, Other (non HMO) | Admitting: Cardiovascular Disease

## 2016-02-23 VITALS — BP 137/81 | HR 60 | Ht 68.0 in | Wt 148.2 lb

## 2016-02-23 DIAGNOSIS — I428 Other cardiomyopathies: Secondary | ICD-10-CM

## 2016-02-23 DIAGNOSIS — I481 Persistent atrial fibrillation: Secondary | ICD-10-CM

## 2016-02-23 DIAGNOSIS — R002 Palpitations: Secondary | ICD-10-CM | POA: Diagnosis not present

## 2016-02-23 DIAGNOSIS — I493 Ventricular premature depolarization: Secondary | ICD-10-CM

## 2016-02-23 DIAGNOSIS — Z7901 Long term (current) use of anticoagulants: Secondary | ICD-10-CM

## 2016-02-23 DIAGNOSIS — I480 Paroxysmal atrial fibrillation: Secondary | ICD-10-CM

## 2016-02-23 DIAGNOSIS — I4819 Other persistent atrial fibrillation: Secondary | ICD-10-CM

## 2016-02-23 DIAGNOSIS — I10 Essential (primary) hypertension: Secondary | ICD-10-CM

## 2016-02-23 DIAGNOSIS — Z8659 Personal history of other mental and behavioral disorders: Secondary | ICD-10-CM

## 2016-02-23 DIAGNOSIS — E782 Mixed hyperlipidemia: Secondary | ICD-10-CM

## 2016-02-23 MED ORDER — HYDRALAZINE HCL 25 MG PO TABS: 25.0000 mg | ORAL_TABLET | Freq: Two times a day (BID) | ORAL | 3 refills | Status: DC

## 2016-02-23 MED ORDER — LISINOPRIL 20 MG PO TABS: 20.0000 mg | ORAL_TABLET | Freq: Every day | ORAL | 3 refills | Status: DC

## 2016-02-23 MED ORDER — SPIRONOLACTONE 25 MG PO TABS: 25.0000 mg | ORAL_TABLET | Freq: Every day | ORAL | 3 refills | Status: DC

## 2016-02-23 MED ORDER — CARVEDILOL 3.125 MG PO TABS: 1.5625 mg | ORAL_TABLET | Freq: Two times a day (BID) | ORAL | 3 refills | Status: DC

## 2016-02-23 MED ORDER — ISOSORBIDE MONONITRATE ER 30 MG PO TB24: 30.0000 mg | ORAL_TABLET | Freq: Every day | ORAL | 3 refills | Status: DC

## 2016-02-23 MED ORDER — RIVAROXABAN 20 MG PO TABS: 20.0000 mg | ORAL_TABLET | Freq: Every day | ORAL | 3 refills | Status: DC

## 2016-02-23 MED ORDER — LORAZEPAM 0.5 MG PO TABS: ORAL_TABLET | ORAL | 0 refills | Status: DC

## 2016-02-23 NOTE — Patient Instructions (Signed)
Your physician recommends that you return for lab work in: January  Your physician has requested that you have an echocardiogram in APRIL 2018 Echocardiography is a painless test that uses sound waves to create images of your heart. It provides your doctor with information about the size and shape of your heart and how well your heart's chambers and valves are working. This procedure takes approximately one hour. There are no restrictions for this procedure.  Your physician has recommended you make the following change in your medication:   1.) The hydralazine has been increased to 25 mg twice a day.  Your physician recommends that you schedule a follow-up appointment in: May 2018 with Dr Claiborne Billings.

## 2016-02-29 ENCOUNTER — Encounter: Payer: Self-pay | Admitting: Cardiovascular Disease

## 2016-02-29 DIAGNOSIS — Z8659 Personal history of other mental and behavioral disorders: Secondary | ICD-10-CM | POA: Insufficient documentation

## 2016-02-29 DIAGNOSIS — I4819 Other persistent atrial fibrillation: Secondary | ICD-10-CM | POA: Insufficient documentation

## 2016-02-29 NOTE — Progress Notes (Signed)
Cardiology Office Note    Date:  02/29/2016   ID:  Brent Cover., DOB 12/11/1945, MRN [id number]  PCP:    Cardiologist:  Shelva Majestic, MD  EP: [name]  Chief Complaint  Patient presents with  . Shortness of Breath    frequently in mornings.    History of Present Illness:  Brent Hortman. is a 70 y.o. male who has a prior history of nonobstructive CAD noted at catheterization in June 2016, a history of symptomatic bradycardia, hypertension, and atrial fibrillation on Xarelto.  Ended: Hospital in transfer from [facility] on 12/13/2015 for evaluation of chest pain.  He underwent repeat cardiac catheterization which showed 40% ostial RCA stenosis, 60% ostial diagonal 1 stenosis, a dysfunction out of proportion to his CAD with EF less than 25%.  During the catheterization.  He was felt to have a nonischemic Carter myopathy with Tikosyn pattern.  He was treated with medical therapy including lisinopril, isosorbide, and spironolactone.  He has a history of statin intolerance.  Due to patient's significant bradycardia, beta blocker was not instituted.  [facility]-up evaluation on 12/22/2015.  At that time, he was started on hydralazine in attempt to improve LV function.  On 02/01/2016, he underwent a permanent pacemaker due to his symptomatic bradycardia and long-standing persistent atrial fibrillation and second-degree AV block.  He underwent insertion of a Medtronic dual-chamber permanent pacemaker by [name].  He has continued to be on anticoagulation.  He is here with his son today.  Patient has had significant increased stress as well as panic attacks.  He also has rheumatoid arthritis.  The son is very concerned that the patient stresses causing significant family problems.  He denies any chest pain.  Denies any presyncope or syncope.  He denies fever chills or night sweats.   Past Medical History:  Diagnosis Date  . Barrett's esophagus  2011   In [place]. Records in [place]  . Coronary atherosclerosis of native coronary artery    Nonobstructive  . Essential hypertension, benign   . Heart block AV first degree    Chronic  . Mixed hyperlipidemia   . NSVT (nonsustained ventricular tachycardia) (HCC)    Holter monitor, 2011; recommended RF ablation by [name] - never required  . PAF (paroxysmal atrial fibrillation) ([place])    Recently documented in [facility] June 2016  . Sinus bradycardia    Symptomatic, acebutolol discontinued    Past Surgical History:  Procedure Laterality Date  . CARDIAC CATHETERIZATION N/A 12/13/2015   Procedure: Left Heart Cath and Coronary Angiography;  Surgeon: Lorretta Harp, MD;  Location: [place] CV LAB;  Service: Cardiovascular;  Laterality: N/A;  . COLONOSCOPY  2014   [place], [place]--[name]  . EP IMPLANTABLE DEVICE N/A 02/01/2016   Procedure: Pacemaker Implant;  Surgeon: Thompson Grayer, MD;  Location: [place] CV LAB;  Service: Cardiovascular;  Laterality: N/A;  . ESOPHAGOGASTRODUODENOSCOPY  2014   [place], [place]--Dr. [place]  . HERNIA REPAIR     2  . KIDNEY STONE SURGERY    . LEFT HEART CATHETERIZATION WITH CORONARY ANGIOGRAM N/A 08/20/2012   Procedure: LEFT HEART CATHETERIZATION WITH CORONARY ANGIOGRAM;  Surgeon: [name], MD;  Location: [facility] CATH LAB;  Service: Cardiovascular;  Laterality: N/A;  . Left leg surgery  1979    Current Medications: Outpatient Medications Prior to Visit  Medication Sig Dispense Refill  . Cranberry 400 MG CAPS Take 4,200 mg by mouth daily.    [name] Kitchen  Glucosamine-MSM-Hyaluronic Acd (JOINT HEALTH PO) Take 1 tablet by mouth daily.    [name] Kitchen NITROSTAT 0.4 MG SL tablet DISSOLVE 1 TABLET UNDER TONGUE EVERY 5 MINUTES AS NEEDED FOR CHEST PAIN 25 tablet 3  . pantoprazole (PROTONIX) 40 MG tablet Take 40 mg by mouth 2 (two) times daily.    . vitamin B-12 (CYANOCOBALAMIN) 1000 MCG tablet Take 1,500 mcg by mouth daily.    . carvedilol (COREG) 3.125 MG tablet Take  1.5625 mg by mouth 2 (two) times daily with a meal. Half tablet twice daily    . hydrALAZINE (APRESOLINE) 10 MG tablet Take 1 tablet (10 mg total) by mouth 3 (three) times daily. 90 tablet 5  . isosorbide mononitrate (IMDUR) 30 MG 24 hr tablet Take 1 tablet (30 mg total) by mouth daily. 30 tablet 0  . lisinopril (PRINIVIL,ZESTRIL) 20 MG tablet Take 1 tablet (20 mg total) by mouth daily. 90 tablet 3  . rivaroxaban (XARELTO) 20 MG TABS tablet Take 1 tablet (20 mg total) by mouth daily with supper. 30 tablet 11  . spironolactone (ALDACTONE) 25 MG tablet Take 0.5 tablets (12.5 mg total) by mouth 2 (two) times daily. (Patient taking differently: Take 25 mg by mouth 2 (two) times daily. ) 30 tablet 0  . spironolactone (ALDACTONE) 25 MG tablet Take 1 tablet (25 mg total) by mouth daily.     No facility-administered medications prior to visit.      Allergies:   Flomax [tamsulosin hcl] and Statins   Social History   Social History  . Marital status: Married    Spouse name: N/A  . Number of children: 1  . Years of education: N/A   Occupational History  . [employer] teacher   Social History Main Topics  . Smoking status: Never Smoker  . Smokeless tobacco: Never Used  . Alcohol use 1.2 oz/week    2 Cans of beer per week     Comment: Occasional beer  . Drug use: No  . Sexual activity: Yes    Partners: Female   Other Topics Concern  . Not on file   Social History Narrative   Daily caffeine     He has been a Therapist, music.  He has not been able to return to work.  Family History:  The patient's family history includes Colon cancer in his mother; Other in his sister, sister, sister, and son.   ROS General: Negative; No fevers, chills, or night sweats;  HEENT: Negative; No changes in vision or hearing, sinus congestion, difficulty swallowing Pulmonary: Negative; No cough, wheezing, shortness of breath, hemoptysis Cardiovascular: see HPI GI: Negative; No  nausea, vomiting, diarrhea, or abdominal pain GU: Negative; No dysuria, hematuria, or difficulty voiding Musculoskeletal: Negative; no myalgias, joint pain, or weakness Hematologic/Oncology: Negative; no easy bruising, bleeding Endocrine: Negative; no heat/cold intolerance; no diabetes Neuro: Negative; no changes in balance, headaches Skin: Negative; No rashes or skin lesions Psychiatric: Negative; No behavioral problems, depression Sleep: Negative; No snoring, daytime sleepiness, hypersomnolence, bruxism, restless legs, hypnogognic hallucinations, no cataplexy Other comprehensive 14 point system review is negative.   PHYSICAL EXAM:   VS:  BP 137/81   Pulse 60   Ht _0  (1.727 m)   Wt 148 lb 3.2 oz (67.2 kg)   BMI 22.53 kg/m     Repeat blood pressure by me was 140/80. Wt Readings from Last 3 Encounters:  02/23/16 148 lb 3.2 oz (67.2 kg)  02/02/16 137 lb 12.8 oz (62.5  kg)  01/24/16 146 lb 3.2 oz (66.3 kg)    General: Alert, oriented, no distress.  Skin: normal turgor, no rashes, warm and dry HEENT: Normocephalic, atraumatic. Pupils equal round and reactive to light; sclera anicteric; extraocular muscles intact; Fundi Without hemorrhages or exudates.  Disks flat. Nose without nasal septal hypertrophy Mouth/Parynx benign; Mallinpatti scale 3 Neck: No JVD, no carotid bruits; normal carotid upstroke Lungs: clear to ausculatation and percussion; no wheezing or rales Chest wall: without tenderness to palpitation Heart: PMI not displaced, RRR, s1 s2 normal, 1/6 systolic murmur, no diastolic murmur, no rubs, gallops, thrills, or heaves Abdomen: soft, nontender; no hepatosplenomehaly, BS+; abdominal aorta nontender and not dilated by palpation. Back: no CVA tenderness Pulses 2+ Musculoskeletal: full range of motion, normal strength, no joint deformities Extremities: no clubbing cyanosis or edema, Homan's sign negative  Neurologic: grossly nonfocal; Cranial nerves grossly  wnl Psychologic: Normal mood and affect   Studies/Labs Reviewed:   ECG (independently read by me): Ventricular paced rhythm at 60 bpm.  Recent Labs: BMP Latest Ref Rng & Units 02/02/2016 01/24/2016 12/31/2015  Glucose 65 - 99 mg/dL 75 81 85  BUN 6 - 20 mg/dL _0 Creatinine 0.61 - 1.24 mg/dL 1.24 1.33(H) 1.37(H)  BUN/Creat Ratio 10 - 24 - - 7(L)  Sodium 135 - 145 mmol/L 139 141 143  Potassium 3.5 - 5.1 mmol/L 3.9 4.2 4.7  Chloride 101 - 111 mmol/L 109 107 103  CO2 22 - 32 mmol/L 21(L) 25 25  Calcium 8.9 - 10.3 mg/dL 8.7(L) 9.0 9.2     Hepatic Function Latest Ref Rng & Units 01/24/2012  Total Protein 6.0 - 8.3 g/dL 6.6  Albumin 3.5 - 5.2 g/dL 3.6  AST 0 - 37 U/L 24  ALT 0 - 53 U/L 18  Alk Phosphatase 39 - 117 U/L 45  Total Bilirubin 0.3 - 1.2 mg/dL 0.8  Bilirubin, Direct 0.0 - 0.3 mg/dL 0.1    CBC Latest Ref Rng & Units 01/24/2016 12/16/2015 12/15/2015  WBC 3.8 - 10.8 K/uL 6.0 10.7(H) 7.7  Hemoglobin 13.2 - 17.1 g/dL 12.9(L) 14.0 13.2  Hematocrit 38.5 - 50.0 % 39.3 41.8 40.6  Platelets 140 - 400 K/uL 336 213 198   Lab Results  Component Value Date   MCV 94.9 01/24/2016   MCV 97.0 12/16/2015   MCV 99.0 12/15/2015   No results found for: TSH Lab Results  Component Value Date   HGBA1C 5.6 01/24/2012     BNP    Component Value Date/Time   BNP 500.0 (H) 12/15/2015 0930    ProBNP No results found for: PROBNP   Lipid Panel     Component Value Date/Time   CHOL 102 12/14/2015 0154   TRIG 66 12/14/2015 0154   HDL 42 12/14/2015 0154   CHOLHDL 2.4 12/14/2015 0154   VLDL 13 12/14/2015 0154   LDLCALC 47 12/14/2015 0154     RADIOLOGY: Dg Chest 2 View  Result Date: 02/02/2016 CLINICAL DATA:  Post pacemaker, coronary artery disease, paroxysmal atrial fibrillation, essential benign hypertension, arrhythmias, mixed hyperlipidemia EXAM: CHEST  2 VIEW COMPARISON:  12/14/2015 FINDINGS: Interval placement of a LEFT subclavian sequential pacemaker with leads  projecting at RIGHT atrium and RIGHT ventricle. Enlargement of cardiac silhouette. Atherosclerotic calcification aorta. Mediastinal contours pulmonary vascularity normal. Bronchitic changes without infiltrate, pleural effusion, or pneumothorax. No acute osseous findings. IMPRESSION: Mild bronchitic changes. New LEFT subclavian sequential pacemaker without acute complication. Electronically Signed   By: Lavonia Dana M.D.   On:  02/02/2016 08:01     Additional studies/ records that were reviewed today include:  I extensively reviewed the patient's October hospitalization, prior office notes, EP procedure, prior cardiac catheterization and echo Doppler studies.  ASSESSMENT:    1. Essential hypertension, benign   2. Symptomatic PVCs   3. PAF (paroxysmal atrial fibrillation) (HCC)   4. Palpitations   5. Mixed hyperlipidemia   6. History of panic attacks   7. Persistent atrial fibrillation ([place])   8. 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  He has a history of persistent atrial fibrillation and is on anticoagulation with Xarelto 20 mg daily.  His breathing pattern has improved and he currently is taking spironolactone 25 g twice a day,  lisinopril 20 mg daily, hydralazine 10 mg 3 times a day, and isosorbide 30 mg.  He admits to increased stress and panic attacks.  Family has requested short-term ascription for him to help him with some of his anxiety.  His blood pressure today is 140/80.  I am further titrating hydralazine to 25 mg twice a day and if he tolerates this for a blood pressure standpoint.  This will be further increased to every 8 hours.  He is not having any anginal symptomatology.  I am recommending a follow-up echo Doppler study be obtained in April  2018 to reevaluate potential improvement in LV function.  He is no longer bradycardic, having undergone insertion of a permanent pacemaker by [name] last month.  I have written a prescription for lorazepam 0.5 mg, #30 with no refill.  I will see him in April following his echo Doppler study for reevaluation.  Medication Adjustments/Labs and Tests Ordered: Current medicines are reviewed at length with the patient today.  Concerns regarding medicines are outlined above.  Medication changes, Labs and Tests ordered today are listed in the Patient Instructions below. Patient Instructions  Your physician recommends that you return for lab work in: January  Your physician has requested that you have an echocardiogram in APRIL 2018 Echocardiography is a painless test that uses sound waves to create images of your heart. It provides your doctor with information about the size and shape of your heart and how well your heart's chambers and valves are working. This procedure takes approximately one hour. There are no restrictions for this procedure.  Your physician has recommended you make the following change in your medication:   1.) The hydralazine has been increased to 25 mg twice a day.  Your physician recommends that you schedule a follow-up appointment in: May 2018 with Dr Claiborne Ray.         Signed, Shelva Majestic, MD  02/29/2016 5:05 PM    [facility] [address], [place], [place], [place]  [address] Phone: [phone]

## 2016-03-17 ENCOUNTER — Ambulatory Visit (INDEPENDENT_AMBULATORY_CARE_PROVIDER_SITE_OTHER): Payer: Medicare Other | Admitting: Internal Medicine

## 2016-03-17 ENCOUNTER — Encounter: Payer: Self-pay | Admitting: Internal Medicine

## 2016-03-17 VITALS — BP 140/84 | HR 61 | Ht 68.0 in | Wt 150.0 lb

## 2016-03-17 DIAGNOSIS — R001 Bradycardia, unspecified: Secondary | ICD-10-CM

## 2016-03-17 DIAGNOSIS — I428 Other cardiomyopathies: Secondary | ICD-10-CM | POA: Diagnosis not present

## 2016-03-17 DIAGNOSIS — I481 Persistent atrial fibrillation: Secondary | ICD-10-CM | POA: Diagnosis not present

## 2016-03-17 DIAGNOSIS — I4819 Other persistent atrial fibrillation: Secondary | ICD-10-CM

## 2016-03-17 DIAGNOSIS — I441 Atrioventricular block, second degree: Secondary | ICD-10-CM

## 2016-03-17 LAB — CUP PACEART INCLINIC DEVICE CHECK
Brady Statistic AP VS Percent: 0 %
Brady Statistic AS VP Percent: 96.5 %
Brady Statistic RA Percent Paced: 0.13 %
Brady Statistic RV Percent Paced: 97.25 %
Date Time Interrogation Session: 20180105181307
Implantable Lead Location: 753859
Lead Channel Impedance Value: 323 Ohm
Lead Channel Pacing Threshold Pulse Width: 0.4 ms
Lead Channel Sensing Intrinsic Amplitude: 1.1 mV
Lead Channel Sensing Intrinsic Amplitude: 2 mV
Lead Channel Setting Pacing Amplitude: 3.5 V
Lead Channel Setting Pacing Pulse Width: 0.4 ms
MDC IDC LEAD IMPLANT DT: 20171121
MDC IDC LEAD IMPLANT DT: 20171121
MDC IDC LEAD LOCATION: 753860
MDC IDC MSMT BATTERY REMAINING LONGEVITY: 73 mo
MDC IDC MSMT BATTERY VOLTAGE: 3.03 V
MDC IDC MSMT LEADCHNL RA IMPEDANCE VALUE: 342 Ohm
MDC IDC MSMT LEADCHNL RA IMPEDANCE VALUE: 437 Ohm
MDC IDC MSMT LEADCHNL RV IMPEDANCE VALUE: 456 Ohm
MDC IDC MSMT LEADCHNL RV PACING THRESHOLD AMPLITUDE: 0.75 V
MDC IDC PG IMPLANT DT: 20171121
MDC IDC SET LEADCHNL RV PACING AMPLITUDE: 2.5 V
MDC IDC SET LEADCHNL RV SENSING SENSITIVITY: 0.9 mV
MDC IDC STAT BRADY AP VP PERCENT: 0.21 %
MDC IDC STAT BRADY AS VS PERCENT: 3.29 %

## 2016-03-17 MED ORDER — FLECAINIDE ACETATE 50 MG PO TABS: 50.0000 mg | ORAL_TABLET | Freq: Two times a day (BID) | ORAL | 3 refills | Status: DC

## 2016-03-17 NOTE — Patient Instructions (Addendum)
Medication Instructions:  Your physician has recommended you make the following change in your medication:  1) Start Flecainide 50 mg twice daily    Labwork: None ordered   Testing/Procedures: None ordered   Follow-Up: Your physician recommends that you schedule a follow-up appointment next week in afib clinic for an EKG  Remote monitoring is used to monitor your Pacemaker  from home. This monitoring reduces the number of office visits required to check your device to one time per year. It allows [place] to keep an eye on the functioning of your device to ensure it is working properly. You are scheduled for a device check from home on 06/19/16. You may send your transmission at any time that day. If you have a wireless device, the transmission will be sent automatically. After your physician reviews your transmission, you will receive a postcard with your next transmission date.     Any Other Special Instructions Will Be Listed Below (If Applicable).     If you need a refill on your cardiac medications before your next appointment, please call your pharmacy.

## 2016-03-17 NOTE — Progress Notes (Signed)
Primary Cardiologist:  [name], MD is PCP  Linzie Collin Jamair Noftz. is a 71 y.o. male who presents today for EP followup.  He has multiple chronic issues including fatigue, dizziness and atypical chest pain.  Prior cath revealed nonobstructive cad.  EF is 40-45%.  He feels "a little better" s/p PPM.  He denies procedure related complications.  He remains in afib.  Today, he denies symptoms of palpitations,  shortness of breath,  lower extremity edema,  presyncope, or syncope.  The patient is otherwise without complaint today.   Past Medical History:  Diagnosis Date  . Barrett's esophagus 2011   In [place]. Records in [place]  . Coronary atherosclerosis of native coronary artery    Nonobstructive  . Essential hypertension, benign   . Heart block AV first degree    Chronic  . Mixed hyperlipidemia   . NSVT (nonsustained ventricular tachycardia) (HCC)    Holter monitor, 2011; recommended RF ablation by [name] - never required  . PAF (paroxysmal atrial fibrillation) ([place])    Recently documented in [facility] June 2016  . Sinus bradycardia    Symptomatic, acebutolol discontinued   Past Surgical History:  Procedure Laterality Date  . CARDIAC CATHETERIZATION N/A 12/13/2015   Procedure: Left Heart Cath and Coronary Angiography;  Surgeon: Lorretta Harp, MD;  Location: [place] CV LAB;  Service: Cardiovascular;  Laterality: N/A;  . COLONOSCOPY  2014   [place]--[name]  . EP IMPLANTABLE DEVICE N/A 02/01/2016   Procedure: Pacemaker Implant;  Surgeon: Thompson Grayer, MD;  Location: [place] CV LAB;  Service: Cardiovascular;  Laterality: N/A;  . ESOPHAGOGASTRODUODENOSCOPY  2014   [place]--Dr. [place]  . HERNIA REPAIR     2  . KIDNEY STONE SURGERY    . LEFT HEART CATHETERIZATION WITH CORONARY ANGIOGRAM N/A 08/20/2012   Procedure: LEFT HEART CATHETERIZATION WITH CORONARY ANGIOGRAM;  Surgeon: [name], MD;  Location: [facility] CATH LAB;  Service:  Cardiovascular;  Laterality: N/A;  . Left leg surgery  1979    Current Outpatient Prescriptions  Medication Sig Dispense Refill  . carvedilol (COREG) 3.125 MG tablet Take 0.5 tablets (1.5625 mg total) by mouth 2 (two) times daily with a meal. Half tablet twice daily 90 tablet 3  . Cranberry 400 MG CAPS Take 4,200 mg by mouth daily.    . Glucosamine-MSM-Hyaluronic Acd (JOINT HEALTH PO) Take 1 tablet by mouth daily.    . hydrALAZINE (APRESOLINE) 25 MG tablet Take 1 tablet (25 mg total) by mouth 2 (two) times daily. 180 tablet 3  . isosorbide mononitrate (IMDUR) 30 MG 24 hr tablet Take 1 tablet (30 mg total) by mouth daily. 90 tablet 3  . lisinopril (PRINIVIL,ZESTRIL) 20 MG tablet Take 1 tablet (20 mg total) by mouth daily. 90 tablet 3  . LORazepam (ATIVAN) 0.5 MG tablet Take 1 tablet daily as needed for anxiety. 30 tablet 0  . NITROSTAT 0.4 MG SL tablet DISSOLVE 1 TABLET UNDER TONGUE EVERY 5 MINUTES AS NEEDED FOR CHEST PAIN 25 tablet 3  . pantoprazole (PROTONIX) 40 MG tablet Take 40 mg by mouth 2 (two) times daily.    . rivaroxaban (XARELTO) 20 MG TABS tablet Take 1 tablet (20 mg total) by mouth daily with supper. 90 tablet 3  . spironolactone (ALDACTONE) 25 MG tablet Take 1 tablet (25 mg total) by mouth daily. 90 tablet 3  . vitamin B-12 (CYANOCOBALAMIN) 1000 MCG tablet Take 1,500 mcg by mouth daily.    . flecainide (TAMBOCOR)  50 MG tablet Take 1 tablet (50 mg total) by mouth 2 (two) times daily. 180 tablet 3   No current facility-administered medications for this visit.     Physical Exam: Vitals:   03/17/16 1633  BP: 140/84  Pulse: 61  Weight: 150 lb (68 kg)  Height: 5\' 8"  (1.727 m)    GEN- The patient is well appearing, alert and oriented x 3 today.   Head- normocephalic, atraumatic Eyes-  Sclera clear, conjunctiva pink Ears- hearing intact Oropharynx- clear Lungs- Clear to ausculation bilaterally, normal work of breathing Heart- RRR (paced) GI- soft, NT, ND, +  BS Extremities- no clubbing, cyanosis, or edema Psych- flat affect (chronic) Skin- pacemaker site is well healed  ekg today reveals afib with V pacing  Assessment and Plan:  1. Complete heart block Device dependant today V paced (his bundle pacing) Normal pacemaker function See Pace Art report No changes today  2. Persistent afib chads2vasc score is 3.  He is on xarelto Start flecainide 50mg  BID today Return to AF clinic in 1 week.  If remains in afib, would consider increasing flecainide and proceeding with cardioversion at that time.  3. Nonischemic CM His bundle pacing Hopefully EF will recover with sinus rhythm  He has asked for me to fill out paperwork for him to remain out of work.  I told him today that from my standpoint he should have returned to work about 6 weeks ago.  I have no indication to keep him out of work.  I have strongly encouraged him to return to work at this time.  Thompson Grayer MD, [facility] 03/17/2016 9:56 PM

## 2016-03-21 ENCOUNTER — Encounter (HOSPITAL_COMMUNITY): Payer: Self-pay | Admitting: Nurse Practitioner

## 2016-03-21 ENCOUNTER — Ambulatory Visit (HOSPITAL_COMMUNITY)
Admission: RE | Admit: 2016-03-21 | Discharge: 2016-03-21 | Disposition: A | Discharge status: Home / Self Care | Payer: Medicare Other | Source: Ambulatory Visit | Attending: Nurse Practitioner | Admitting: Nurse Practitioner

## 2016-03-21 VITALS — BP 136/82 | HR 79

## 2016-03-21 DIAGNOSIS — I4819 Other persistent atrial fibrillation: Secondary | ICD-10-CM

## 2016-03-21 DIAGNOSIS — Z7901 Long term (current) use of anticoagulants: Secondary | ICD-10-CM | POA: Diagnosis not present

## 2016-03-21 DIAGNOSIS — Z79899 Other long term (current) drug therapy: Secondary | ICD-10-CM | POA: Diagnosis not present

## 2016-03-21 DIAGNOSIS — R9431 Abnormal electrocardiogram [ECG] [EKG]: Secondary | ICD-10-CM | POA: Insufficient documentation

## 2016-03-21 DIAGNOSIS — I4891 Unspecified atrial fibrillation: Secondary | ICD-10-CM | POA: Diagnosis present

## 2016-03-21 DIAGNOSIS — I481 Persistent atrial fibrillation: Secondary | ICD-10-CM

## 2016-03-21 NOTE — Progress Notes (Signed)
Pt in for EKG today for possible Flecainide start.  EKG and BP to be reviewed by Roderic Palau, NP

## 2016-03-21 NOTE — Progress Notes (Signed)
Primary Care Physician: Shelva Majestic, MD Referring Physician: Dr. Janyth Pupa. is a 71 y.o. male with a h/o persistent afib as well as multiple chronic issues including fatigue, dizziness and atypical chest pain.  Prior cath revealed nonobstructive cad.  EF is 40-45%.  He feels "a little better" s/p PPM.  He was started on flecainide 50 mg bid by [name] 1/5 and to f/u in afib clinic. He continues in afib per device interrogation. Per [name] and if continues in afib increase flecainide and plan on cardioversion. He did miss one xarelto 12/27 and will not be eligible for cardioversion until 1/18.  Today, he denies symptoms of palpitations, chest pain,  orthopnea, PND, lower extremity edema, dizziness, presyncope, syncope, or neurologic sequela. Positive for fatigue and shortness of breath with exertion. The patient is tolerating medications without difficulties and is otherwise without complaint today.   Past Medical History:  Diagnosis Date  . Barrett's esophagus 2011   In [place]. Records in [place]  . Coronary atherosclerosis of native coronary artery    Nonobstructive  . Essential hypertension, benign   . Heart block AV first degree    Chronic  . Mixed hyperlipidemia   . NSVT (nonsustained ventricular tachycardia) (HCC)    Holter monitor, 2011; recommended RF ablation by [name] - never required  . PAF (paroxysmal atrial fibrillation) ([place])    Recently documented in [facility] June 2016  . Sinus bradycardia    Symptomatic, acebutolol discontinued   Past Surgical History:  Procedure Laterality Date  . CARDIAC CATHETERIZATION N/A 12/13/2015   Procedure: Left Heart Cath and Coronary Angiography;  Surgeon: Lorretta Harp, MD;  Location: [facility];  Service: Cardiovascular;  Laterality: N/A;  . COLONOSCOPY  2014   [name]  . EP IMPLANTABLE DEVICE N/A 02/01/2016   Procedure: Pacemaker Implant;  Surgeon: Thompson Grayer, MD;  Location:  [facility];  Service: Cardiovascular;  Laterality: N/A;  . ESOPHAGOGASTRODUODENOSCOPY  2014   [place]--Dr. Sever[place]  . HERNIA REPAIR     2  . KIDNEY STONE SURGERY    . LEFT HEART CATHETERIZATION WITH CORONARY ANGIOGRAM N/A 08/20/2012   Procedure: LEFT HEART CATHETERIZATION WITH CORONARY ANGIOGRAM;  Surgeon: [name], MD;  Location: [facility] CATH LAB;  Service: Cardiovascular;  Laterality: N/A;  . Left leg surgery  1979    Current Outpatient Prescriptions  Medication Sig Dispense Refill  . carvedilol (COREG) 3.125 MG tablet Take 0.5 tablets (1.5625 mg total) by mouth 2 (two) times daily with a meal. Half tablet twice daily 90 tablet 3  . Cranberry 400 MG CAPS Take 4,200 mg by mouth daily.    . flecainide (TAMBOCOR) 50 MG tablet Take 1 tablet (50 mg total) by mouth 2 (two) times daily. 180 tablet 3  . Glucosamine-MSM-Hyaluronic Acd (JOINT HEALTH PO) Take 1 tablet by mouth daily.    . hydrALAZINE (APRESOLINE) 25 MG tablet Take 1 tablet (25 mg total) by mouth 2 (two) times daily. 180 tablet 3  . isosorbide mononitrate (IMDUR) 30 MG 24 hr tablet Take 1 tablet (30 mg total) by mouth daily. 90 tablet 3  . lisinopril (PRINIVIL,ZESTRIL) 20 MG tablet Take 1 tablet (20 mg total) by mouth daily. 90 tablet 3  . LORazepam (ATIVAN) 0.5 MG tablet Take 1 tablet daily as needed for anxiety. 30 tablet 0  . NITROSTAT 0.4 MG SL tablet DISSOLVE 1 TABLET UNDER TONGUE EVERY 5 MINUTES AS NEEDED FOR CHEST PAIN  25 tablet 3  . pantoprazole (PROTONIX) 40 MG tablet Take 40 mg by mouth 2 (two) times daily.    . rivaroxaban (XARELTO) 20 MG TABS tablet Take 1 tablet (20 mg total) by mouth daily with supper. 90 tablet 3  . spironolactone (ALDACTONE) 25 MG tablet Take 1 tablet (25 mg total) by mouth daily. 90 tablet 3  . vitamin B-12 (CYANOCOBALAMIN) 1000 MCG tablet Take 1,500 mcg by mouth daily.     No current facility-administered medications for this encounter.     Allergies  Allergen Reactions  .  Flomax [Tamsulosin Hcl] Other (See Comments)    Itch, rash/hives  . Statins Other (See Comments)    Leg cramps    Social History   Social History  . Marital status: Married    Spouse name: N/A  . Number of children: 1  . Years of education: N/A   Occupational History  . [place]     Industrial TEFL teacher   Social History Main Topics  . Smoking status: Never Smoker  . Smokeless tobacco: Never Used  . Alcohol use 1.2 oz/week    2 Cans of beer per week     Comment: Occasional beer  . Drug use: No  . Sexual activity: Yes    Partners: Female   Other Topics Concern  . Not on file   Social History Narrative   Daily caffeine     Family History  Problem Relation Age of Onset  . Colon cancer Mother   . Other Sister      AGE-21-HEALTHY  . Other Sister     AGE 14-HEALTHY  . Other Sister     AGE 88 HEALTHY  . Other Son     AGE [place] are reviewed and negative except as per the HPI above  Physical Exam: Vitals:   03/21/16 1033  BP: 136/82  Pulse: 79   Wt Readings from Last 3 Encounters:  03/17/16 150 lb (68 kg)  02/23/16 148 lb 3.2 oz (67.2 kg)  02/02/16 137 lb 12.8 oz (62.5 kg)    Labs: Lab Results  Component Value Date   NA 139 02/02/2016   K 3.9 02/02/2016   CL 109 02/02/2016   CO2 21 (L) 02/02/2016   GLUCOSE 75 02/02/2016   BUN 13 02/02/2016   CREATININE 1.24 02/02/2016   CALCIUM 8.7 (L) 02/02/2016   Lab Results  Component Value Date   INR 1.08 12/13/2015   Lab Results  Component Value Date   CHOL 102 12/14/2015   HDL 42 12/14/2015   LDLCALC 47 12/14/2015   TRIG 66 12/14/2015     GEN- The patient is well appearing, alert and oriented x 3 today.   Head- normocephalic, atraumatic Eyes-  Sclera clear, conjunctiva pink Ears- hearing intact Oropharynx- clear Neck- supple, no JVP Lymph- no cervical lymphadenopathy Lungs- Clear to ausculation bilaterally, normal work of breathing Heart-  regular rate and  rhythm, no murmurs, rubs or gallops, PMI not laterally displaced GI- soft, NT, ND, + BS Extremities- no clubbing, cyanosis, or edema MS- no significant deformity or atrophy Skin- no rash or lesion Psych- euthymic mood, full affect Neuro- strength and sensation are intact  EKG- ventricular paced rhythm at 79 bpm, qrs int 214, qtc 550 Interrogation shows afib    Assessment and Plan: 1. Persistent afib Per [name] plan increase flecainide to 100 mg bid  Continue xarelto without missed doses, since he missed a dose on 12/27,  he will not be eligible until 1/18 for cardioversion, if drug does not convert F/u in afib clinic in one week  2. PPM Pr [name] interrogation today shows normal functioning afib   Butch Penny C. Graiden Henes, [facility] [address] [place], [place] [address] [phone]

## 2016-03-22 NOTE — Addendum Note (Signed)
Addended by: Claude Manges on: 03/22/2016 01:26 PM   Modules accepted: Orders

## 2016-03-27 ENCOUNTER — Telehealth: Payer: Self-pay | Admitting: Cardiovascular Disease

## 2016-03-27 NOTE — Telephone Encounter (Signed)
New message      Calling to verify instructions for aldactone 25mg .  Please use reference number  [id number].

## 2016-03-27 NOTE — Telephone Encounter (Signed)
Returned call. Technician explained that there was a crease in the paper refill sent to [facility] and they needed to verify the dose/frequency of patient's spironolactone. Have verified 25mg  for daily use, pharmacist verified and voiced thanks for call.

## 2016-03-29 ENCOUNTER — Inpatient Hospital Stay (HOSPITAL_COMMUNITY)
Admission: RE | Admit: 2016-03-29 | Payer: No Typology Code available for payment source | Source: Ambulatory Visit | Admitting: Nurse Practitioner

## 2016-04-05 ENCOUNTER — Ambulatory Visit (HOSPITAL_COMMUNITY)
Admission: RE | Admit: 2016-04-05 | Discharge: 2016-04-05 | Disposition: A | Discharge status: Home / Self Care | Payer: Medicare Other | Source: Ambulatory Visit | Attending: Nurse Practitioner | Admitting: Nurse Practitioner

## 2016-04-05 ENCOUNTER — Encounter (HOSPITAL_COMMUNITY): Payer: Self-pay | Admitting: Nurse Practitioner

## 2016-04-05 DIAGNOSIS — E782 Mixed hyperlipidemia: Secondary | ICD-10-CM | POA: Insufficient documentation

## 2016-04-05 DIAGNOSIS — Z888 Allergy status to other drugs, medicaments and biological substances status: Secondary | ICD-10-CM | POA: Diagnosis not present

## 2016-04-05 DIAGNOSIS — Z7901 Long term (current) use of anticoagulants: Secondary | ICD-10-CM | POA: Insufficient documentation

## 2016-04-05 DIAGNOSIS — K2270 Barrett's esophagus without dysplasia: Secondary | ICD-10-CM | POA: Diagnosis not present

## 2016-04-05 DIAGNOSIS — I481 Persistent atrial fibrillation: Secondary | ICD-10-CM | POA: Diagnosis present

## 2016-04-05 DIAGNOSIS — Z79899 Other long term (current) drug therapy: Secondary | ICD-10-CM | POA: Insufficient documentation

## 2016-04-05 DIAGNOSIS — I10 Essential (primary) hypertension: Secondary | ICD-10-CM | POA: Diagnosis not present

## 2016-04-05 DIAGNOSIS — I2510 Atherosclerotic heart disease of native coronary artery without angina pectoris: Secondary | ICD-10-CM | POA: Diagnosis not present

## 2016-04-05 DIAGNOSIS — Z9889 Other specified postprocedural states: Secondary | ICD-10-CM | POA: Insufficient documentation

## 2016-04-05 DIAGNOSIS — Z800 Family history of malignant neoplasm of digestive organs: Secondary | ICD-10-CM | POA: Insufficient documentation

## 2016-04-05 DIAGNOSIS — I4819 Other persistent atrial fibrillation: Secondary | ICD-10-CM

## 2016-04-05 DIAGNOSIS — I480 Paroxysmal atrial fibrillation: Secondary | ICD-10-CM | POA: Insufficient documentation

## 2016-04-05 LAB — BASIC METABOLIC PANEL
Anion gap: 5 (ref 5–15)
BUN: 16 mg/dL (ref 6–20)
CALCIUM: 9.6 mg/dL (ref 8.9–10.3)
CO2: 26 mmol/L (ref 22–32)
CREATININE: 1.32 mg/dL — AB (ref 0.61–1.24)
Chloride: 109 mmol/L (ref 101–111)
GFR calc non Af Amer: 54 mL/min — ABNORMAL LOW (ref >60)
Glucose, Bld: 90 mg/dL (ref 65–99)
Potassium: 4.4 mmol/L (ref 3.5–5.1)
SODIUM: 140 mmol/L (ref 135–145)

## 2016-04-05 LAB — CBC
HCT: 42.1 % (ref 39.0–52.0)
Hemoglobin: 14 g/dL (ref 13.0–17.0)
MCH: 30.4 pg (ref 26.0–34.0)
MCHC: 33.3 g/dL (ref 30.0–36.0)
MCV: 91.3 fL (ref 78.0–100.0)
Platelets: 300 10*3/uL (ref 150–400)
RBC: 4.61 MIL/uL (ref 4.22–5.81)
RDW: 14 % (ref 11.5–15.5)
WBC: 4.8 10*3/uL (ref 4.0–10.5)

## 2016-04-05 MED ORDER — FLECAINIDE ACETATE 50 MG PO TABS
50.0000 mg | ORAL_TABLET | Freq: Two times a day (BID) | ORAL | 3 refills | Status: DC
Start: 2016-04-05 — End: 2016-04-17

## 2016-04-05 NOTE — Patient Instructions (Signed)
Cardioversion scheduled for Tuesday, January 30th  - Arrive at the [facility] and go to admitting at 10:30AM  -Do not eat or drink anything after midnight the night prior to your procedure.  - Take all your medication with a sip of water prior to arrival.  - You will not be able to drive home after your procedure.

## 2016-04-05 NOTE — Progress Notes (Signed)
Primary Care Physician: Shelva Majestic, MD Referring Physician: Dr. Janyth Pupa. is a 71 y.o. male with a h/o persistent afib as well as multiple chronic issues including fatigue, dizziness and atypical chest pain.  Prior cath revealed nonobstructive cad.  EF is 40-45%.  He feels "a little better" s/p PPM.  He was started on flecainide 50 mg bid by [name] 1/5 and to f/u in afib clinic. He continues in afib per device interrogation. Per [name] and if continues in afib increase flecainide and plan on cardioversion. He did miss one xarelto 12/27 and will not be eligible for cardioversion until 1/18.  He returns to clinic 1/24 and remains in afib by interrogation. He had to reduce his flecainide dose back to 50 mg for not tolerating 100 mg of flecainide. He states he had hives, itching and some swelling of his lips. He talked to his pharmacist that said to reduce dose and within reducing back to 50 mg bid, his symptoms resolved.He still is c/o of some soreness of his PPM site and was reassured that this would improve with time. His site is well healed. He c/o's of chest discomfort if he has to walk any distance but is quickly relieved with rest, this is not a new finding.  Today, he denies symptoms of palpitations, chest pain,  orthopnea, PND, lower extremity edema, dizziness, presyncope, syncope, or neurologic sequela. Positive for fatigue and shortness of breath with exertion. The patient is tolerating medications without difficulties and is otherwise without complaint today.   Past Medical History:  Diagnosis Date  . Barrett's esophagus 2011   In [place]. Records in [place]  . Coronary atherosclerosis of native coronary artery    Nonobstructive  . Essential hypertension, benign   . Heart block AV first degree    Chronic  . Mixed hyperlipidemia   . NSVT (nonsustained ventricular tachycardia) (HCC)    Holter monitor, 2011; recommended RF ablation by [name] - never  required  . PAF (paroxysmal atrial fibrillation) ([place])    Recently documented in [facility] June 2016  . Sinus bradycardia    Symptomatic, acebutolol discontinued   Past Surgical History:  Procedure Laterality Date  . CARDIAC CATHETERIZATION N/A 12/13/2015   Procedure: Left Heart Cath and Coronary Angiography;  Surgeon: Lorretta Harp, MD;  Location: [facility];  Service: Cardiovascular;  Laterality: N/A;  . COLONOSCOPY  2014   [name]  . EP IMPLANTABLE DEVICE N/A 02/01/2016   Procedure: Pacemaker Implant;  Surgeon: Thompson Grayer, MD;  Location: [facility];  Service: Cardiovascular;  Laterality: N/A;  . ESOPHAGOGASTRODUODENOSCOPY  2014   [place]--Dr. [place]  . HERNIA REPAIR     2  . KIDNEY STONE SURGERY    . LEFT HEART CATHETERIZATION WITH CORONARY ANGIOGRAM N/A 08/20/2012   Procedure: LEFT HEART CATHETERIZATION WITH CORONARY ANGIOGRAM;  Surgeon: [name], MD;  Location: [facility] CATH LAB;  Service: Cardiovascular;  Laterality: N/A;  . Left leg surgery  1979    Current Outpatient Prescriptions  Medication Sig Dispense Refill  . carvedilol (COREG) 3.125 MG tablet Take 0.5 tablets (1.5625 mg total) by mouth 2 (two) times daily with a meal. Half tablet twice daily 90 tablet 3  . Cranberry 400 MG CAPS Take 4,200 mg by mouth daily.    . flecainide (TAMBOCOR) 50 MG tablet Take 1 tablet (50 mg total) by mouth 2 (two) times daily. 60 tablet 3  . Glucosamine-MSM-Hyaluronic Acd (JOINT  HEALTH PO) Take 1 tablet by mouth daily.    . hydrALAZINE (APRESOLINE) 25 MG tablet Take 1 tablet (25 mg total) by mouth 2 (two) times daily. 180 tablet 3  . isosorbide mononitrate (IMDUR) 30 MG 24 hr tablet Take 1 tablet (30 mg total) by mouth daily. 90 tablet 3  . lisinopril (PRINIVIL,ZESTRIL) 20 MG tablet Take 1 tablet (20 mg total) by mouth daily. 90 tablet 3  . LORazepam (ATIVAN) 0.5 MG tablet Take 1 tablet daily as needed for anxiety. 30 tablet 0  . NITROSTAT 0.4 MG  SL tablet DISSOLVE 1 TABLET UNDER TONGUE EVERY 5 MINUTES AS NEEDED FOR CHEST PAIN 25 tablet 3  . pantoprazole (PROTONIX) 40 MG tablet Take 40 mg by mouth 2 (two) times daily.    . rivaroxaban (XARELTO) 20 MG TABS tablet Take 1 tablet (20 mg total) by mouth daily with supper. 90 tablet 3  . spironolactone (ALDACTONE) 25 MG tablet Take 1 tablet (25 mg total) by mouth daily. 90 tablet 3  . vitamin B-12 (CYANOCOBALAMIN) 1000 MCG tablet Take 1,500 mcg by mouth daily.     No current facility-administered medications for this encounter.     Allergies  Allergen Reactions  . Flomax [Tamsulosin Hcl] Other (See Comments)    Itch, rash/hives  . Statins Other (See Comments)    Leg cramps    Social History   Social History  . Marital status: Married    Spouse name: N/A  . Number of children: 1  . Years of education: N/A   Occupational History  . [place]     Industrial TEFL teacher   Social History Main Topics  . Smoking status: Never Smoker  . Smokeless tobacco: Never Used  . Alcohol use 1.2 oz/week    2 Cans of beer per week     Comment: Occasional beer  . Drug use: No  . Sexual activity: Yes    Partners: Female   Other Topics Concern  . Not on file   Social History Narrative   Daily caffeine     Family History  Problem Relation Age of Onset  . Colon cancer Mother   . Other Sister      AGE-70-HEALTHY  . Other Sister     AGE 18-HEALTHY  . Other Sister     AGE 15 HEALTHY  . Other Son     AGE [place] are reviewed and negative except as per the HPI above  Physical Exam: Vitals:   04/05/16 1028  BP: 140/82  Pulse: 61  Weight: 147 lb (66.7 kg)  Height: 5\' 8"  (1.727 m)   Wt Readings from Last 3 Encounters:  04/05/16 147 lb (66.7 kg)  03/17/16 150 lb (68 kg)  02/23/16 148 lb 3.2 oz (67.2 kg)    Labs: Lab Results  Component Value Date   NA 140 04/05/2016   K 4.4 04/05/2016   CL 109 04/05/2016   CO2 26 04/05/2016   GLUCOSE 90  04/05/2016   BUN 16 04/05/2016   CREATININE 1.32 (H) 04/05/2016   CALCIUM 9.6 04/05/2016   Lab Results  Component Value Date   INR 1.08 12/13/2015   Lab Results  Component Value Date   CHOL 102 12/14/2015   HDL 42 12/14/2015   LDLCALC 47 12/14/2015   TRIG 66 12/14/2015     GEN- The patient is well appearing, alert and oriented x 3 today.   Head- normocephalic, atraumatic Eyes-  Sclera clear, conjunctiva  pink Ears- hearing intact Oropharynx- clear Neck- supple, no JVP Lymph- no cervical lymphadenopathy Lungs- Clear to ausculation bilaterally, normal work of breathing Heart- paced regular rate and rhythm, no murmurs, rubs or gallops, PMI not laterally displaced, PPM site healed GI- soft, NT, ND, + BS Extremities- no clubbing, cyanosis, or edema MS- no significant deformity or atrophy Skin- no rash or lesion Psych- euthymic mood, full affect Neuro- strength and sensation are intact  EKG- ventricular paced rhythm at 61 bpm, qrs int 162, qtc 553 Interrogation shows persistent afib   Assessment and Plan: 1. Persistent afib Pt did not tolerate 100 mg flecainide Continues on flecainide 50 mg bid Continue xarelto, states no missed doses for at least 3 weeks Will schedule for cardioversion 04/11/16, risks/benfits of procedure discussed  2. PPM Per [name] interrogation today shows normal functioning  device Reassured that soreness at site should improve over the next couple of months  F/u one week s/p [place]. Aiken Withem, [facility] [address] [place], [place] [address] [phone]

## 2016-04-07 ENCOUNTER — Telehealth: Payer: Self-pay | Admitting: Cardiovascular Disease

## 2016-04-07 NOTE — Telephone Encounter (Signed)
New message   Kim from Dr. Nickola Major call requesting to speak with RN about the fax that was sent to Dr. Thurnell Lose office. Maudie Mercury states they are having some trouble reading some of the information. Please call back to discuss

## 2016-04-07 NOTE — Telephone Encounter (Signed)
Spoke with kim, paperwork faxed to them says patient may undergo routine dental treatment without restrictions and the patient may hold xarelto 2 days prior to procedures.

## 2016-04-11 ENCOUNTER — Ambulatory Visit (HOSPITAL_COMMUNITY)
Admission: RE | Admit: 2016-04-11 | Discharge: 2016-04-11 | Disposition: A | Discharge status: Home / Self Care | Payer: Medicare Other | Source: Ambulatory Visit | Attending: Cardiovascular Disease | Admitting: Cardiovascular Disease

## 2016-04-11 ENCOUNTER — Encounter (HOSPITAL_COMMUNITY): Payer: Self-pay | Admitting: Cardiovascular Disease

## 2016-04-11 ENCOUNTER — Ambulatory Visit (HOSPITAL_COMMUNITY): Payer: Medicare Other | Admitting: Anesthesiology

## 2016-04-11 ENCOUNTER — Encounter (HOSPITAL_COMMUNITY)
Admission: RE | Disposition: A | Discharge status: Home / Self Care | Payer: Self-pay | Source: Ambulatory Visit | Attending: Cardiovascular Disease

## 2016-04-11 DIAGNOSIS — I2510 Atherosclerotic heart disease of native coronary artery without angina pectoris: Secondary | ICD-10-CM | POA: Diagnosis not present

## 2016-04-11 DIAGNOSIS — I10 Essential (primary) hypertension: Secondary | ICD-10-CM | POA: Insufficient documentation

## 2016-04-11 DIAGNOSIS — Z7901 Long term (current) use of anticoagulants: Secondary | ICD-10-CM | POA: Diagnosis not present

## 2016-04-11 DIAGNOSIS — I480 Paroxysmal atrial fibrillation: Secondary | ICD-10-CM | POA: Diagnosis not present

## 2016-04-11 DIAGNOSIS — I481 Persistent atrial fibrillation: Secondary | ICD-10-CM | POA: Insufficient documentation

## 2016-04-11 DIAGNOSIS — Z79899 Other long term (current) drug therapy: Secondary | ICD-10-CM | POA: Insufficient documentation

## 2016-04-11 DIAGNOSIS — I4891 Unspecified atrial fibrillation: Secondary | ICD-10-CM | POA: Diagnosis present

## 2016-04-11 HISTORY — PX: CARDIOVERSION: SHX1299

## 2016-04-11 SURGERY — CARDIOVERSION
Anesthesia: Monitor Anesthesia Care

## 2016-04-11 MED ORDER — SODIUM CHLORIDE 0.9 % IV SOLN
INTRAVENOUS | Status: DC
  Administered 2016-04-11: 11:00:00 via INTRAVENOUS

## 2016-04-11 MED ORDER — LIDOCAINE 2% (20 MG/ML) 5 ML SYRINGE
INTRAMUSCULAR | Status: DC | PRN
  Administered 2016-04-11: 40 mg via INTRAVENOUS

## 2016-04-11 MED ORDER — PROPOFOL 10 MG/ML IV BOLUS
INTRAVENOUS | Status: DC | PRN
  Administered 2016-04-11: 50 mg via INTRAVENOUS
  Administered 2016-04-11: 20 mg via INTRAVENOUS

## 2016-04-11 NOTE — Transfer of Care (Signed)
Immediate Anesthesia Transfer of Care Note  Patient: [place].  Procedure(s) Performed: Procedure(s): CARDIOVERSION (N/A)  Patient Location: Endoscopy Unit  Anesthesia Type:MAC  Level of Consciousness: awake, alert  and oriented  Airway & Oxygen Therapy: Patient Spontanous Breathing and Patient connected to nasal cannula oxygen  Post-op Assessment: Report given to RN and Post -op Vital signs reviewed and stable  Post vital signs: Reviewed and stable  Last Vitals:  Vitals:   04/11/16 1045 04/11/16 1222  BP: 136/71 (!) 106/57  Pulse: 61 60  Resp: (!) 9 17  Temp: 36.3 C 36.4 C    Last Pain:  Vitals:   04/11/16 1222  TempSrc: Oral         Complications: No apparent anesthesia complications

## 2016-04-11 NOTE — Discharge Instructions (Signed)
Electrical Cardioversion, Care After °This sheet gives you information about how to care for yourself after your procedure. Your health care provider may also give you more specific instructions. If you have problems or questions, contact your health care provider. °What can I expect after the procedure? °After the procedure, it is common to have: °· Some redness on the skin where the shocks were given. °Follow these instructions at home: °· Do not drive for 24 hours if you were given a medicine to help you relax (sedative). °· Take over-the-counter and prescription medicines only as told by your health care provider. °· Ask your health care provider how to check your pulse. Check it often. °· Rest for 48 hours after the procedure or as told by your health care provider. °· Avoid or limit your caffeine use as told by your health care provider. °Contact a health care provider if: °· You feel like your heart is beating too quickly or your pulse is not regular. °· You have a serious muscle cramp that does not go away. °Get help right away if: °· You have discomfort in your chest. °· You are dizzy or you feel faint. °· You have trouble breathing or you are short of breath. °· Your speech is slurred. °· You have trouble moving an arm or leg on one side of your body. °· Your fingers or toes turn cold or blue. °This information is not intended to replace advice given to you by your health care provider. Make sure you discuss any questions you have with your health care provider. °Document Released: 12/18/2012 Document Revised: 10/01/2015 Document Reviewed: 09/03/2015 °Elsevier Interactive Patient Education © 2017 Elsevier Inc. ° °

## 2016-04-11 NOTE — CV Procedure (Signed)
Electrical Cardioversion Procedure Note [place] [id number] 1945-07-12  Procedure: Electrical Cardioversion Indications:  Atrial Fibrillation  Procedure Details Consent: Risks of procedure as well as the alternatives and risks of each were explained to the (patient/caregiver).  Consent for procedure obtained. Time Out: Verified patient identification, verified procedure, site/side was marked, verified correct patient position, special equipment/implants available, medications/allergies/relevent history reviewed, required imaging and test results available.  Performed  Patient placed on cardiac monitor, pulse oximetry, supplemental oxygen as necessary.  Sedation given: propofol Pacer pads placed anterior and posterior chest.  Cardioverted 1 time(s).  Cardioverted at 150J.  Evaluation Findings: Post procedure EKG shows: NSR Complications: None Patient did tolerate procedure well.   Skeet Latch, MD 04/11/2016, 12:26 PM

## 2016-04-11 NOTE — Interval H&P Note (Signed)
History and Physical Interval Note:  04/11/2016 10:48 AM  [place].  has presented today for surgery, with the diagnosis of AFIB  The various methods of treatment have been discussed with the patient and family. After consideration of risks, benefits and other options for treatment, the patient has consented to  Procedure(s): CARDIOVERSION (N/A) as a surgical intervention .  The patient's history has been reviewed, patient examined, no change in status, stable for surgery.  I have reviewed the patient's chart and labs.  Questions were answered to the patient's satisfaction.     Skeet Latch, MD 04/11/2016 10:48 AM

## 2016-04-11 NOTE — Anesthesia Preprocedure Evaluation (Addendum)
Anesthesia Evaluation  Patient identified by MRN, date of birth, ID band  Reviewed: Allergy & Precautions, NPO status , Patient's Chart, lab work & pertinent test results, reviewed documented beta blocker date and time   Airway Mallampati: I       Dental  (+) Teeth Intact   Pulmonary neg pulmonary ROS,    breath sounds clear to auscultation       Cardiovascular hypertension, Pt. on medications and Pt. on home beta blockers + CAD  + dysrhythmias  Rhythm:Regular Rate:Normal     Neuro/Psych negative neurological ROS  negative psych ROS   GI/Hepatic Neg liver ROS, GERD  Medicated,  Endo/Other  negative endocrine ROS  Renal/GU negative Renal ROS  negative genitourinary   Musculoskeletal negative musculoskeletal ROS (+)   Abdominal   Peds negative pediatric ROS (+)  Hematology negative hematology ROS (+)   Anesthesia Other Findings   Reproductive/Obstetrics negative OB ROS                          Anesthesia Physical Anesthesia Plan  ASA: III  Anesthesia Plan: General   Post-op Pain Management:    Induction: Intravenous  Airway Management Planned: Mask  Additional Equipment:   Intra-op Plan:   Post-operative Plan:   Informed Consent: I have reviewed the patients History and Physical, chart, labs and discussed the procedure including the risks, benefits and alternatives for the proposed anesthesia with the patient or authorized representative who has indicated his/her understanding and acceptance.   Dental advisory given  Plan Discussed with: CRNA and Anesthesiologist  Anesthesia Plan Comments:        Anesthesia Quick Evaluation

## 2016-04-11 NOTE — Anesthesia Postprocedure Evaluation (Addendum)
Anesthesia Post Note  Patient: [place].  Procedure(s) Performed: Procedure(s) (LRB): CARDIOVERSION (N/A)  Patient location during evaluation: PACU Anesthesia Type: MAC Level of consciousness: awake and alert Pain management: pain level controlled Vital Signs Assessment: post-procedure vital signs reviewed and stable Respiratory status: spontaneous breathing, nonlabored ventilation, respiratory function stable and patient connected to nasal cannula oxygen Cardiovascular status: blood pressure returned to baseline and stable Postop Assessment: no signs of nausea or vomiting Anesthetic complications: no       Last Vitals:  Vitals:   04/11/16 1250 04/11/16 1300  BP: 117/66   Pulse: (!) 59 60  Resp: 14 12  Temp:      Last Pain:  Vitals:   04/11/16 1222  TempSrc: Oral                 Effie Berkshire

## 2016-04-11 NOTE — H&P (View-Only) (Signed)
Primary Care Physician: Shelva Majestic, MD Referring Physician: Dr. Janyth Pupa. is a 71 y.o. male with a h/o persistent afib as well as multiple chronic issues including fatigue, dizziness and atypical chest pain.  Prior cath revealed nonobstructive cad.  EF is 40-45%.  He feels "a little better" s/p PPM.  He was started on flecainide 50 mg bid by [name] 1/5 and to f/u in afib clinic. He continues in afib per device interrogation. Per [name] and if continues in afib increase flecainide and plan on cardioversion. He did miss one xarelto 12/27 and will not be eligible for cardioversion until 1/18.  He returns to clinic 1/24 and remains in afib by interrogation. He had to reduce his flecainide dose back to 50 mg for not tolerating 100 mg of flecainide. He states he had hives, itching and some swelling of his lips. He talked to his pharmacist that said to reduce dose and within reducing back to 50 mg bid, his symptoms resolved.He still is c/o of some soreness of his PPM site and was reassured that this would improve with time. His site is well healed. He c/o's of chest discomfort if he has to walk any distance but is quickly relieved with rest, this is not a new finding.  Today, he denies symptoms of palpitations, chest pain,  orthopnea, PND, lower extremity edema, dizziness, presyncope, syncope, or neurologic sequela. Positive for fatigue and shortness of breath with exertion. The patient is tolerating medications without difficulties and is otherwise without complaint today.   Past Medical History:  Diagnosis Date  . Barrett's esophagus 2011   In [place]. Records in [place]  . Coronary atherosclerosis of native coronary artery    Nonobstructive  . Essential hypertension, benign   . Heart block AV first degree    Chronic  . Mixed hyperlipidemia   . NSVT (nonsustained ventricular tachycardia) (HCC)    Holter monitor, 2011; recommended RF ablation by [name] - never  required  . PAF (paroxysmal atrial fibrillation) ([place])    Recently documented in [facility] June 2016  . Sinus bradycardia    Symptomatic, acebutolol discontinued   Past Surgical History:  Procedure Laterality Date  . CARDIAC CATHETERIZATION N/A 12/13/2015   Procedure: Left Heart Cath and Coronary Angiography;  Surgeon: Lorretta Harp, MD;  Location: [place] CV LAB;  Service: Cardiovascular;  Laterality: N/A;  . COLONOSCOPY  2014   [place], [place]--[name]  . EP IMPLANTABLE DEVICE N/A 02/01/2016   Procedure: Pacemaker Implant;  Surgeon: Thompson Grayer, MD;  Location: [place] CV [facility];  Service: Cardiovascular;  Laterality: N/A;  . ESOPHAGOGASTRODUODENOSCOPY  2014   [place]--Dr. [place]  . HERNIA REPAIR     2  . KIDNEY STONE SURGERY    . LEFT HEART CATHETERIZATION WITH CORONARY ANGIOGRAM N/A 08/20/2012   Procedure: LEFT HEART CATHETERIZATION WITH CORONARY ANGIOGRAM;  Surgeon: [name], MD;  Location: [facility] CATH LAB;  Service: Cardiovascular;  Laterality: N/A;  . Left leg surgery  1979    Current Outpatient Prescriptions  Medication Sig Dispense Refill  . carvedilol (COREG) 3.125 MG tablet Take 0.5 tablets (1.5625 mg total) by mouth 2 (two) times daily with a meal. Half tablet twice daily 90 tablet 3  . Cranberry 400 MG CAPS Take 4,200 mg by mouth daily.    . flecainide (TAMBOCOR) 50 MG tablet Take 1 tablet (50 mg total) by mouth 2 (two) times daily. 60 tablet 3  . Glucosamine-MSM-Hyaluronic Acd (JOINT  HEALTH PO) Take 1 tablet by mouth daily.    . hydrALAZINE (APRESOLINE) 25 MG tablet Take 1 tablet (25 mg total) by mouth 2 (two) times daily. 180 tablet 3  . isosorbide mononitrate (IMDUR) 30 MG 24 hr tablet Take 1 tablet (30 mg total) by mouth daily. 90 tablet 3  . lisinopril (PRINIVIL,ZESTRIL) 20 MG tablet Take 1 tablet (20 mg total) by mouth daily. 90 tablet 3  . LORazepam (ATIVAN) 0.5 MG tablet Take 1 tablet daily as needed for anxiety. 30 tablet 0  . NITROSTAT 0.4 MG  SL tablet DISSOLVE 1 TABLET UNDER TONGUE EVERY 5 MINUTES AS NEEDED FOR CHEST PAIN 25 tablet 3  . pantoprazole (PROTONIX) 40 MG tablet Take 40 mg by mouth 2 (two) times daily.    . rivaroxaban (XARELTO) 20 MG TABS tablet Take 1 tablet (20 mg total) by mouth daily with supper. 90 tablet 3  . spironolactone (ALDACTONE) 25 MG tablet Take 1 tablet (25 mg total) by mouth daily. 90 tablet 3  . vitamin B-12 (CYANOCOBALAMIN) 1000 MCG tablet Take 1,500 mcg by mouth daily.     No current facility-administered medications for this encounter.     Allergies  Allergen Reactions  . Flomax [Tamsulosin Hcl] Other (See Comments)    Itch, rash/hives  . Statins Other (See Comments)    Leg cramps    Social History   Social History  . Marital status: Married    Spouse name: N/A  . Number of children: 1  . Years of education: N/A   Occupational History  . [place]     [employer] teacher   Social History Main Topics  . Smoking status: Never Smoker  . Smokeless tobacco: Never Used  . Alcohol use 1.2 oz/week    2 Cans of beer per week     Comment: Occasional beer  . Drug use: No  . Sexual activity: Yes    Partners: Female   Other Topics Concern  . Not on file   Social History Narrative   Daily caffeine     Family History  Problem Relation Age of Onset  . Colon cancer Mother   . Other Sister      AGE-67-HEALTHY  . Other Sister     AGE 96-HEALTHY  . Other Sister     AGE 33 HEALTHY  . Other Son     AGE [place] are reviewed and negative except as per the HPI above  Physical Exam: Vitals:   04/05/16 1028  BP: 140/82  Pulse: 61  Weight: 147 lb (66.7 kg)  Height: 5\' 8"  (1.727 m)   Wt Readings from Last 3 Encounters:  04/05/16 147 lb (66.7 kg)  03/17/16 150 lb (68 kg)  02/23/16 148 lb 3.2 oz (67.2 kg)    Labs: Lab Results  Component Value Date   NA 140 04/05/2016   K 4.4 04/05/2016   CL 109 04/05/2016   CO2 26 04/05/2016   GLUCOSE 90  04/05/2016   BUN 16 04/05/2016   CREATININE 1.32 (H) 04/05/2016   CALCIUM 9.6 04/05/2016   Lab Results  Component Value Date   INR 1.08 12/13/2015   Lab Results  Component Value Date   CHOL 102 12/14/2015   HDL 42 12/14/2015   LDLCALC 47 12/14/2015   TRIG 66 12/14/2015     GEN- The patient is well appearing, alert and oriented x 3 today.   Head- normocephalic, atraumatic Eyes-  Sclera clear, conjunctiva  pink Ears- hearing intact Oropharynx- clear Neck- supple, no JVP Lymph- no cervical lymphadenopathy Lungs- Clear to ausculation bilaterally, normal work of breathing Heart- paced regular rate and rhythm, no murmurs, rubs or gallops, PMI not laterally displaced, PPM site healed GI- soft, NT, ND, + BS Extremities- no clubbing, cyanosis, or edema MS- no significant deformity or atrophy Skin- no rash or lesion Psych- euthymic mood, full affect Neuro- strength and sensation are intact  EKG- ventricular paced rhythm at 61 bpm, qrs int 162, qtc 553 Interrogation shows persistent afib   Assessment and Plan: 1. Persistent afib Pt did not tolerate 100 mg flecainide Continues on flecainide 50 mg bid Continue xarelto, states no missed doses for at least 3 weeks Will schedule for cardioversion 04/11/16, risks/benfits of procedure discussed  2. PPM Per [name] interrogation today shows normal functioning  device Reassured that soreness at site should improve over the next couple of months  F/u one week s/p [place]. Mozella Rexrode, [facility] [address] [place], [place] [address] [phone]

## 2016-04-11 NOTE — Anesthesia Procedure Notes (Signed)
Procedure Name: MAC Date/Time: 04/11/2016 12:10 PM Performed by: Kyung Rudd Pre-anesthesia Checklist: Patient identified, Emergency Drugs available, Suction available and Patient being monitored Patient Re-evaluated:Patient Re-evaluated prior to inductionOxygen Delivery Method: Ambu bag Preoxygenation: Pre-oxygenation with 100% oxygen Intubation Type: IV induction

## 2016-04-12 ENCOUNTER — Encounter (HOSPITAL_COMMUNITY): Payer: Self-pay | Admitting: Cardiovascular Disease

## 2016-04-14 ENCOUNTER — Ambulatory Visit: Payer: No Typology Code available for payment source | Admitting: Physician Assistant

## 2016-04-17 ENCOUNTER — Ambulatory Visit (HOSPITAL_COMMUNITY)
Admission: RE | Admit: 2016-04-17 | Discharge: 2016-04-17 | Disposition: A | Discharge status: Home / Self Care | Payer: Medicare Other | Source: Ambulatory Visit | Attending: Nurse Practitioner | Admitting: Nurse Practitioner

## 2016-04-17 ENCOUNTER — Encounter (HOSPITAL_COMMUNITY): Payer: Self-pay | Admitting: Nurse Practitioner

## 2016-04-17 VITALS — BP 132/64 | HR 62 | Ht 68.0 in | Wt 147.8 lb

## 2016-04-17 DIAGNOSIS — I442 Atrioventricular block, complete: Secondary | ICD-10-CM | POA: Diagnosis present

## 2016-04-17 DIAGNOSIS — Z950 Presence of cardiac pacemaker: Secondary | ICD-10-CM | POA: Diagnosis not present

## 2016-04-17 DIAGNOSIS — I428 Other cardiomyopathies: Secondary | ICD-10-CM | POA: Diagnosis not present

## 2016-04-17 DIAGNOSIS — I481 Persistent atrial fibrillation: Secondary | ICD-10-CM | POA: Diagnosis not present

## 2016-04-17 DIAGNOSIS — I4819 Other persistent atrial fibrillation: Secondary | ICD-10-CM

## 2016-04-17 DIAGNOSIS — Z79899 Other long term (current) drug therapy: Secondary | ICD-10-CM | POA: Diagnosis not present

## 2016-04-17 DIAGNOSIS — I441 Atrioventricular block, second degree: Secondary | ICD-10-CM | POA: Diagnosis not present

## 2016-04-17 DIAGNOSIS — Z7901 Long term (current) use of anticoagulants: Secondary | ICD-10-CM | POA: Insufficient documentation

## 2016-04-17 NOTE — Patient Instructions (Signed)
Your physician has recommended you make the following change in your medication:  1)stop flecainide

## 2016-04-17 NOTE — Addendum Note (Signed)
Encounter addended by: Juluis Mire, RN on: 04/17/2016 11:28 AM<BR>    Actions taken: Medication long-term status modified, Order list changed, Sign clinical note

## 2016-04-17 NOTE — Addendum Note (Signed)
Encounter addended by: Thompson Grayer, MD on: 04/17/2016 11:11 AM<BR>    Actions taken: Charge Capture section accepted

## 2016-04-17 NOTE — Progress Notes (Signed)
Primary Cardiologist:  [name], MD is PCP  Brent Ray. is a 71 y.o. male who presents today for EP followup.  He was recently cardioverted but has returned to afib.  He did not feel better with sinus rhythm.  He continues to have fatigue.   He has not returned to work as I had advised 03/17/16.  Today, he denies symptoms of palpitations,  shortness of breath,  lower extremity edema,  presyncope, or syncope.  The patient is otherwise without complaint today.   Past Medical History:  Diagnosis Date  . Barrett's esophagus 2011   In [place]. Records in [place]  . Coronary atherosclerosis of native coronary artery    Nonobstructive  . Essential hypertension, benign   . Heart block AV first degree    Chronic  . Mixed hyperlipidemia   . NSVT (nonsustained ventricular tachycardia) (HCC)    Holter monitor, 2011; recommended RF ablation by [name] - never required  . PAF (paroxysmal atrial fibrillation) ([name])    Recently documented in [facility] June 2016  . Sinus bradycardia    Symptomatic, acebutolol discontinued   Past Surgical History:  Procedure Laterality Date  . CARDIAC CATHETERIZATION N/A 12/13/2015   Procedure: Left Heart Cath and Coronary Angiography;  Surgeon: Lorretta Harp, MD;  Location: [place] CV LAB;  Service: Cardiovascular;  Laterality: N/A;  . CARDIOVERSION N/A 04/11/2016   Procedure: CARDIOVERSION;  Surgeon: Skeet Latch, MD;  Location: [place];  Service: Cardiovascular;  Laterality: N/A;  . COLONOSCOPY  2014   [place]--[place]. [place]  . EP IMPLANTABLE DEVICE N/A 02/01/2016   Procedure: Pacemaker Implant;  Surgeon: Thompson Grayer, MD;  Location: [place] CV LAB;  Service: Cardiovascular;  Laterality: N/A;  . ESOPHAGOGASTRODUODENOSCOPY  2014   [place]--Dr. [place]  . HERNIA REPAIR     2  . KIDNEY STONE SURGERY    . LEFT HEART CATHETERIZATION WITH CORONARY ANGIOGRAM N/A 08/20/2012   Procedure: LEFT HEART  CATHETERIZATION WITH CORONARY ANGIOGRAM;  Surgeon: [name], MD;  Location: [facility] CATH LAB;  Service: Cardiovascular;  Laterality: N/A;  . Left leg surgery  1979    Current Outpatient Prescriptions  Medication Sig Dispense Refill  . carvedilol (COREG) 3.125 MG tablet Take 3.125 mg by mouth 2 (two) times daily with a meal.    . Cranberry 400 MG CAPS Take 4,200 mg by mouth daily.    . flecainide (TAMBOCOR) 50 MG tablet Take 1 tablet (50 mg total) by mouth 2 (two) times daily. 60 tablet 3  . Glucosamine-MSM-Hyaluronic Acd (JOINT HEALTH PO) Take 1 tablet by mouth daily.    . hydrALAZINE (APRESOLINE) 25 MG tablet Take 1 tablet (25 mg total) by mouth 2 (two) times daily. 180 tablet 3  . isosorbide mononitrate (IMDUR) 30 MG 24 hr tablet Take 1 tablet (30 mg total) by mouth daily. 90 tablet 3  . lisinopril (PRINIVIL,ZESTRIL) 20 MG tablet Take 1 tablet (20 mg total) by mouth daily. 90 tablet 3  . LORazepam (ATIVAN) 0.5 MG tablet Take 1 tablet daily as needed for anxiety. 30 tablet 0  . NITROSTAT 0.4 MG SL tablet DISSOLVE 1 TABLET UNDER TONGUE EVERY 5 MINUTES AS NEEDED FOR CHEST PAIN 25 tablet 3  . pantoprazole (PROTONIX) 40 MG tablet Take 40 mg by mouth 2 (two) times daily.    . rivaroxaban (XARELTO) 20 MG TABS tablet Take 1 tablet (20 mg total) by mouth daily with supper. 90 tablet 3  . spironolactone (ALDACTONE)  25 MG tablet Take 1 tablet (25 mg total) by mouth daily. 90 tablet 3  . vitamin B-12 (CYANOCOBALAMIN) 1000 MCG tablet Take 1,500 mcg by mouth daily.     No current facility-administered medications for this encounter.     Physical Exam: Vitals:   04/17/16 1020  BP: 132/64  BP Location: Left Arm  Patient Position: Sitting  Cuff Size: Normal  Pulse: 62  Weight: 147 lb 12.8 oz (67 kg)  Height: 5\' 8"  (1.727 m)    GEN- The patient is well appearing, alert and oriented x 3 today.   Head- normocephalic, atraumatic Eyes-  Sclera clear, conjunctiva pink Ears- hearing  intact Oropharynx- clear Lungs- Clear to ausculation bilaterally, normal work of breathing Heart- RRR (paced) GI- soft, NT, ND, + BS Extremities- no clubbing, cyanosis, or edema Psych- flat affect (chronic) Skin- pacemaker site is well healed  ekg today reveals afib with V pacing  Assessment and Plan:  1. Complete heart block V paced (his bundle pacing) Normal pacemaker function See Pace Art report I have increased lower pacing rate from 60 to 70 bpm today  2. Persistent afib chads2vasc score is 3.  He is on xarelto Stop flecainide.  I did discuss tikosyn as an option, though I am not convinced that he feels any different when in sinus.  He is clear that he does not wish for any additional AADs at this time.  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  I informed him and his family again today that I have no indication to keep him out of work.  They acted like this was new information.  I showed the patient and his family my note from 03/17/16 in which I was very clear regarding the fact that he was to return to work.  I have also signed a work release for him today. It is not clear to me that he wants to return to work.  Unfortunately, he and his family were upset today.  I worry that the patient has a significant component of depression.  I have encouraged him to seek evaluation for this.  I have also encouraged them to discuss any other decisions regarding disability with either PCP or Dr Claiborne Billings. He states "I felt better when I was drinking alcohol.  I think I will just return to this."  I have strongly discouraged this and suggested that perhaps alcohol may be contributing to his issues. He and his family left rather abruptly and did not receive an AVS prior to leaving today.  I was not able to set up a follow-up with him.  It appears that Dr Claiborne Billings plans to see him again in April. I  will follow remotely.  I am happy to see him again at any time.    Thompson Grayer MD, [facility] 04/17/2016 10:27 AM

## 2016-04-18 ENCOUNTER — Other Ambulatory Visit: Payer: Self-pay | Admitting: Internal Medicine

## 2016-04-20 ENCOUNTER — Other Ambulatory Visit: Payer: Self-pay | Admitting: Cardiovascular Disease

## 2016-04-20 MED ORDER — RIVAROXABAN 20 MG PO TABS: 20.0000 mg | ORAL_TABLET | Freq: Every day | ORAL | 3 refills | Status: DC

## 2016-04-20 NOTE — Telephone Encounter (Signed)
New Message   Vaughan Basta from [facility] call requesting to speak with refill to get a verbal order for pt Xarelto. Please call back to discuss

## 2016-04-20 NOTE — Telephone Encounter (Signed)
Spoke with Vaughan Basta - phoned in refill

## 2016-05-05 ENCOUNTER — Encounter: Payer: Managed Care, Other (non HMO) | Admitting: Internal Medicine

## 2016-06-21 ENCOUNTER — Other Ambulatory Visit (HOSPITAL_COMMUNITY): Payer: Managed Care, Other (non HMO)

## 2016-08-11 NOTE — Addendum Note (Signed)
Addendum  created 08/11/16 0950 by Jaylei Fuerte D, MD   Sign clinical note    

## 2018-04-29 IMAGING — DX DG CHEST 2V
2 series · 2 of 2 positions shown · non-contrast
Comparison: 12/12/2015

CLINICAL DATA: Center chest pain that extends into the throat. This
is a chronic issue that the pt states he has been dealing with for 2
years. Pt denies SOB. Hx of HTN, PAF, Barrett's esophagus, sinus
bradycardia. Nonsmoker.

EXAM:
CHEST  2 VIEW

[chest pa]
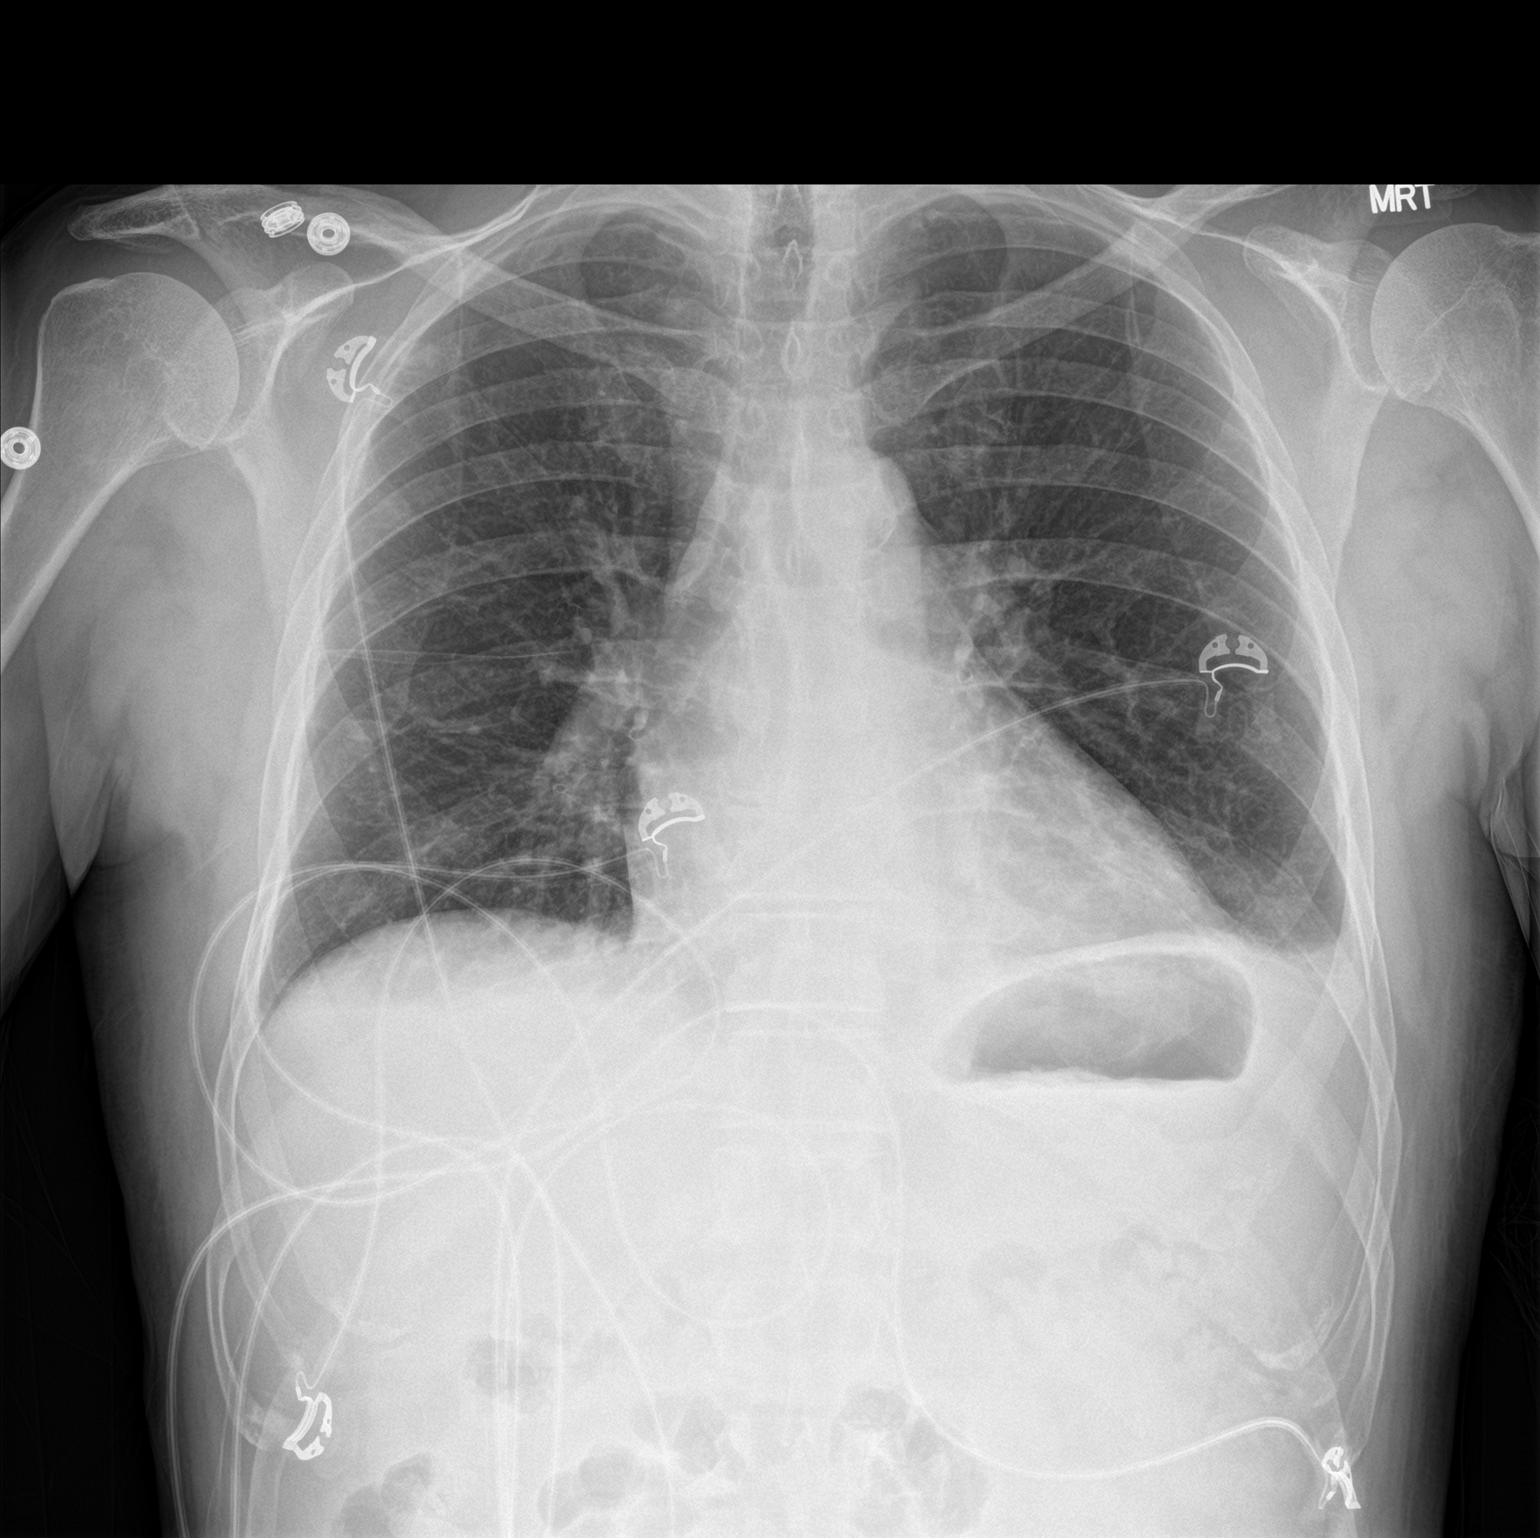

[chest lat]
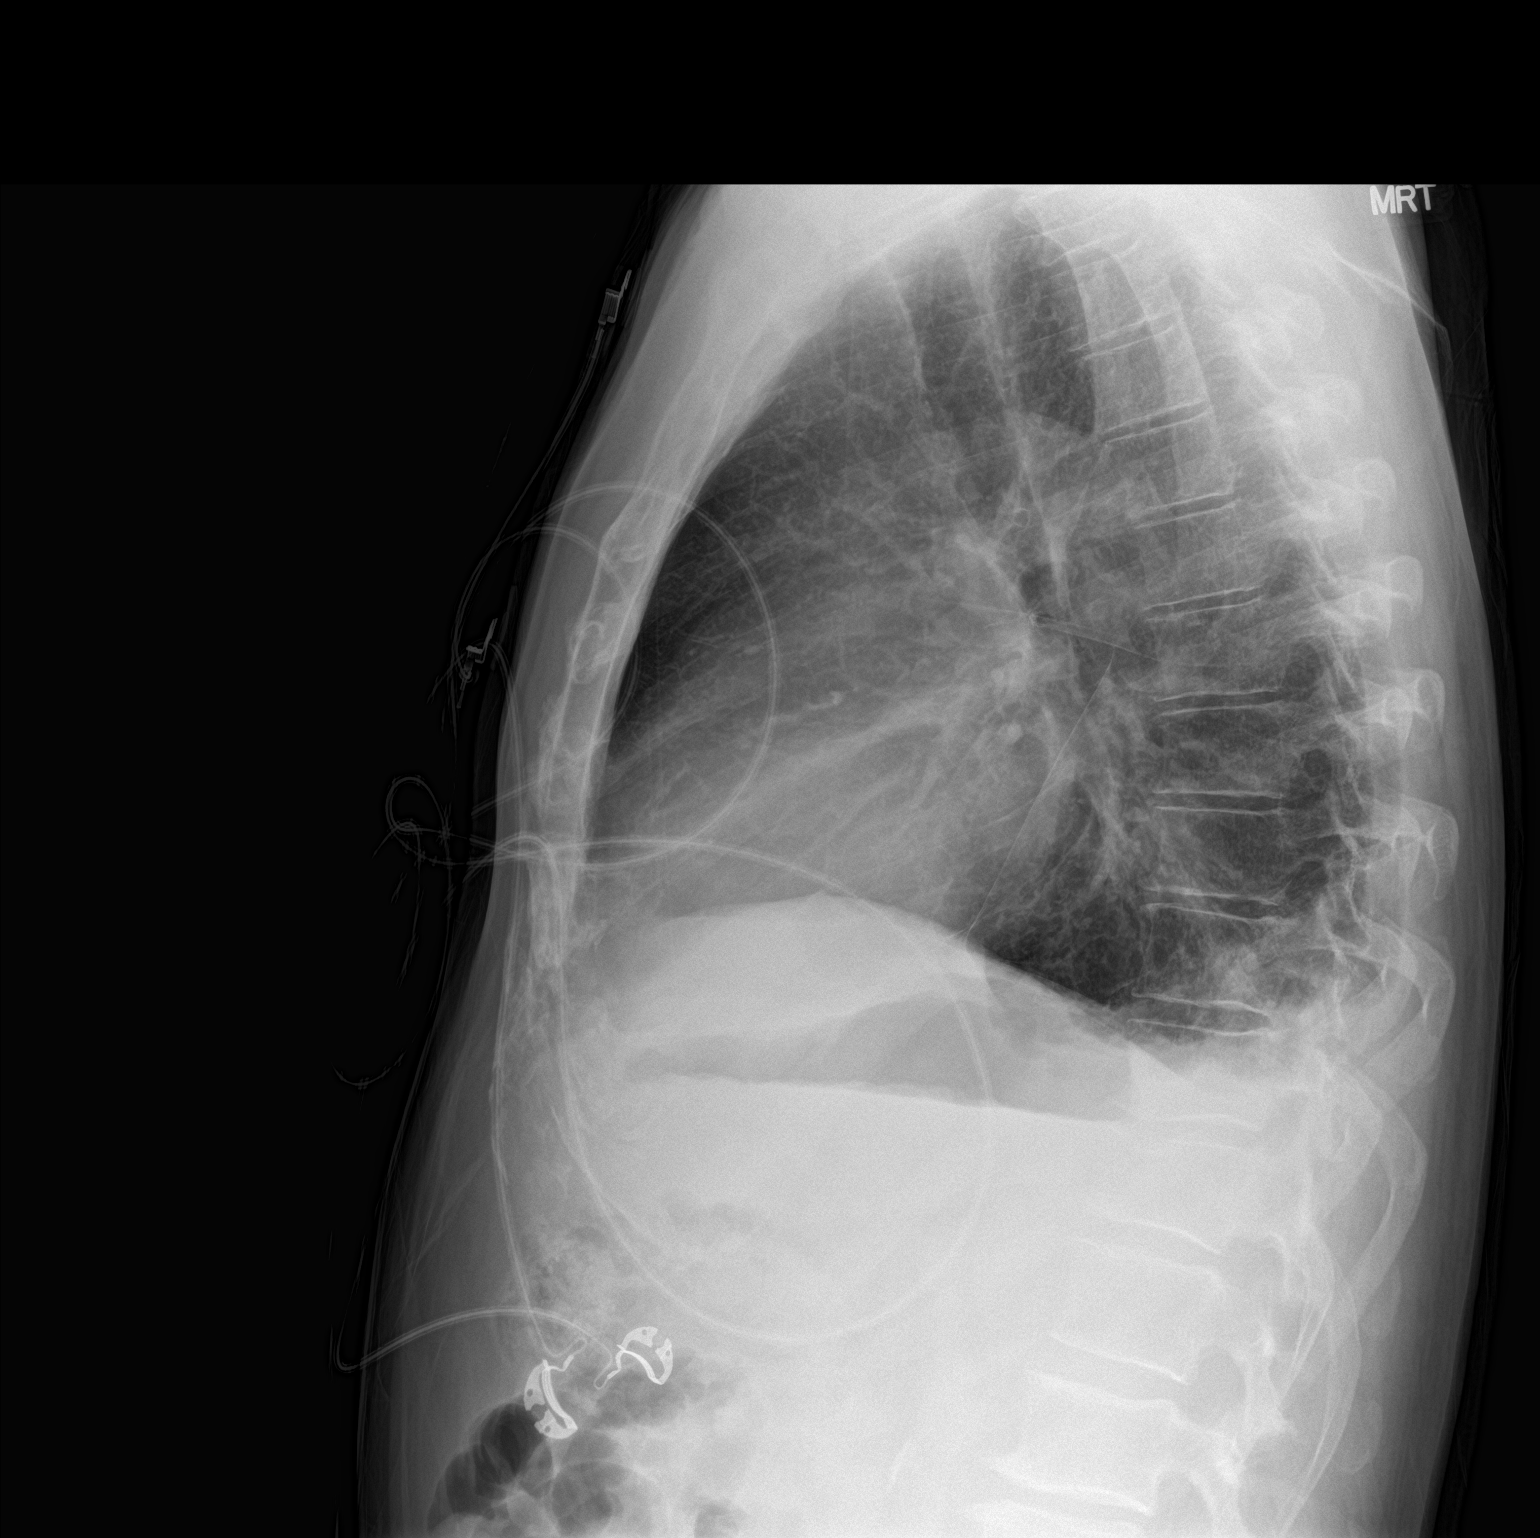

[2 of 2 positions shown; findings below may reference images not displayed]

FINDINGS: Midline trachea. Borderline cardiomegaly. Atherosclerosis in the
transverse aorta. Trace left pleural fluid is new. No pneumothorax.
Pulmonary interstitial prominence is at least partially due to low
lung volumes. Subsegmental atelectasis at the left lung base.
IMPRESSION: Trace left pleural fluid.  Adjacent left base atelectasis.

Aortic atherosclerosis.

## 2018-06-18 IMAGING — CR DG CHEST 2V
2 series · 2 of 2 positions shown · non-contrast
Comparison: 12/14/2015

CLINICAL DATA: Post pacemaker, coronary artery disease, paroxysmal
atrial fibrillation, essential benign hypertension, arrhythmias,
mixed hyperlipidemia

EXAM:
CHEST  2 VIEW

[chest pa]
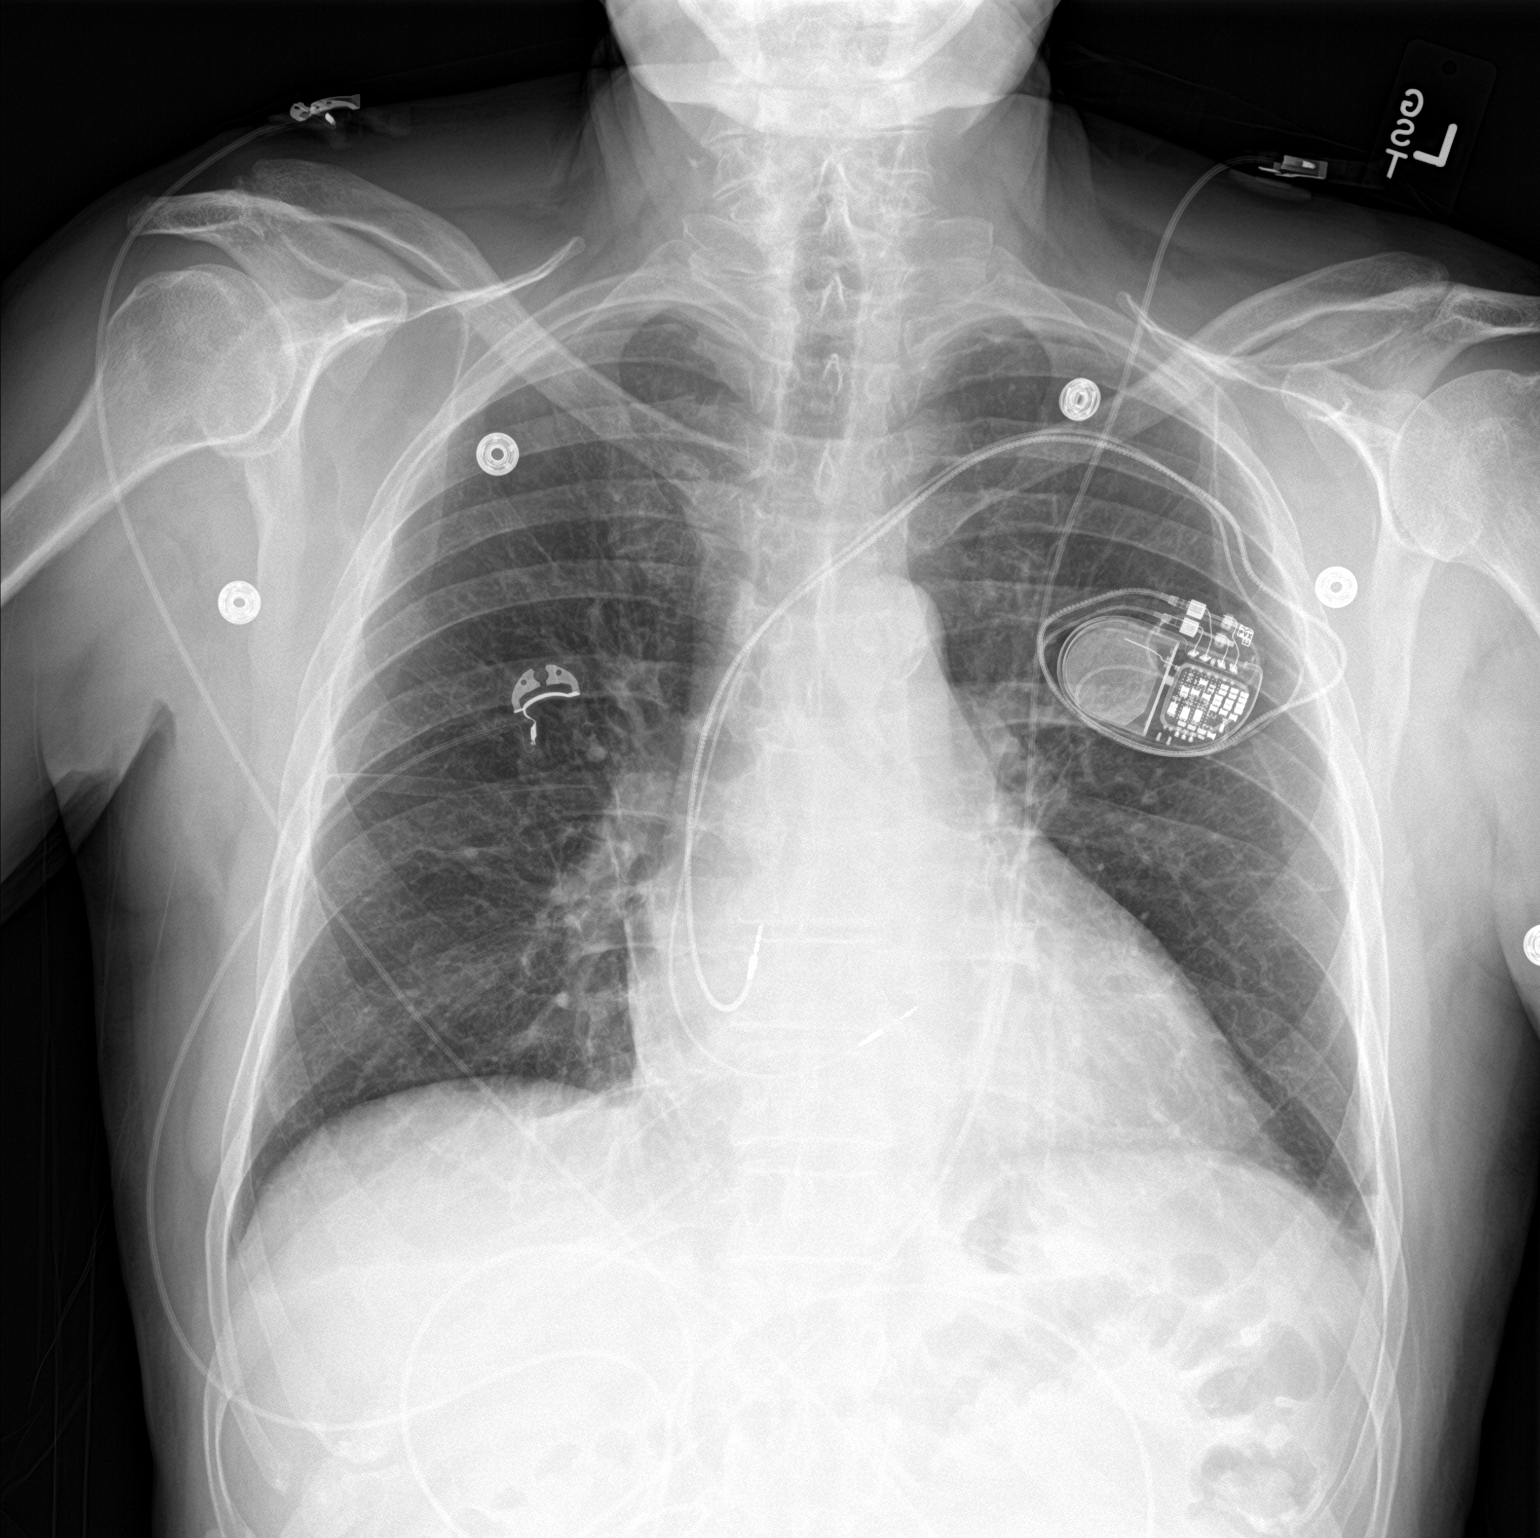

[chest lat]
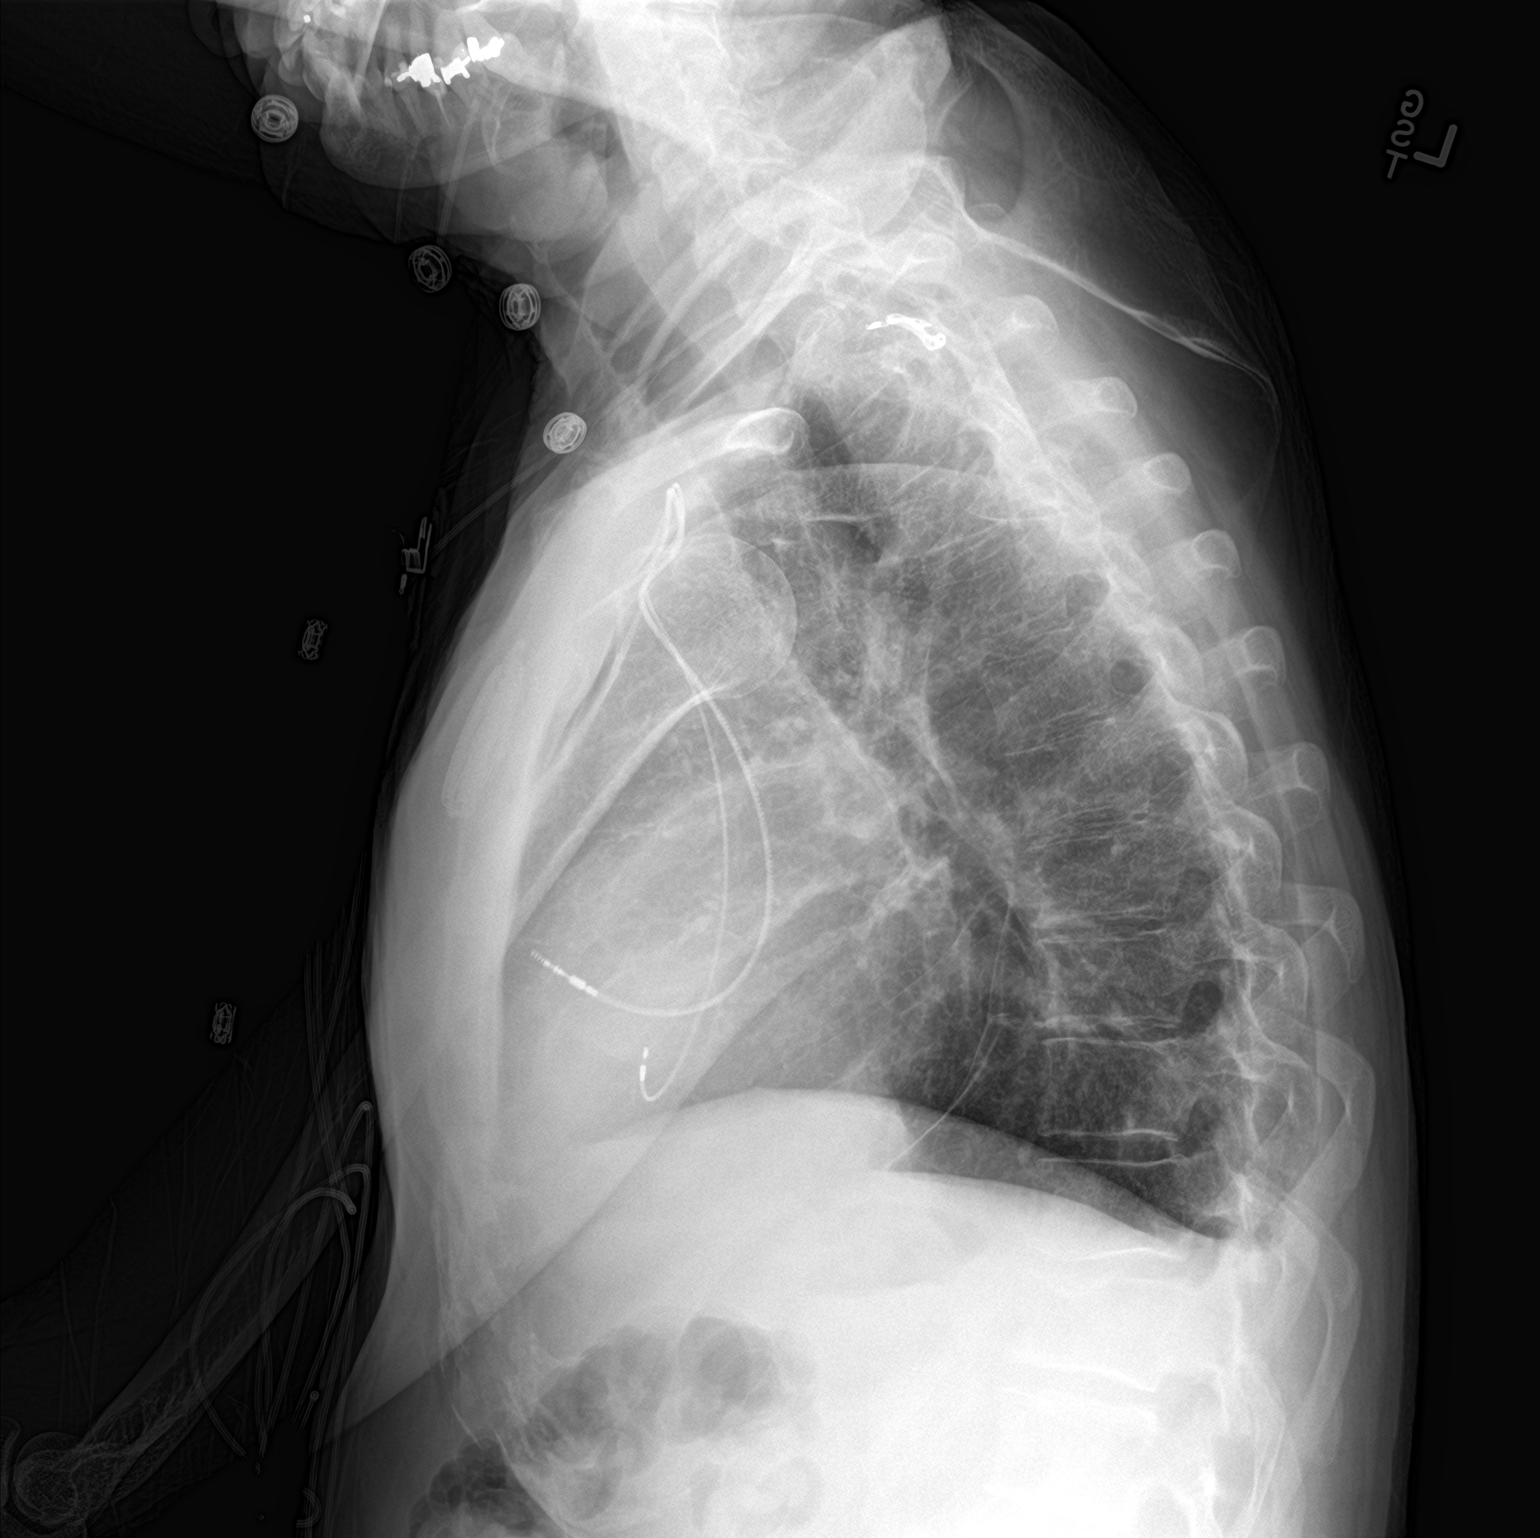

[2 of 2 positions shown; findings below may reference images not displayed]

FINDINGS: Interval placement of a LEFT subclavian sequential pacemaker with
leads projecting at RIGHT atrium and RIGHT ventricle.

Enlargement of cardiac silhouette.

Atherosclerotic calcification aorta.

Mediastinal contours pulmonary vascularity normal.

Bronchitic changes without infiltrate, pleural effusion, or
pneumothorax.

No acute osseous findings.
IMPRESSION: Mild bronchitic changes.

New LEFT subclavian sequential pacemaker without acute complication.

## 2019-11-11 ENCOUNTER — Encounter (INDEPENDENT_AMBULATORY_CARE_PROVIDER_SITE_OTHER): Payer: Self-pay | Admitting: Gastroenterology

## 2019-12-08 ENCOUNTER — Encounter (INDEPENDENT_AMBULATORY_CARE_PROVIDER_SITE_OTHER): Payer: Self-pay | Admitting: *Deleted

## 2019-12-08 ENCOUNTER — Encounter (INDEPENDENT_AMBULATORY_CARE_PROVIDER_SITE_OTHER): Payer: Self-pay | Admitting: Gastroenterology

## 2019-12-08 ENCOUNTER — Other Ambulatory Visit (INDEPENDENT_AMBULATORY_CARE_PROVIDER_SITE_OTHER): Payer: Self-pay | Admitting: *Deleted

## 2019-12-08 ENCOUNTER — Telehealth (INDEPENDENT_AMBULATORY_CARE_PROVIDER_SITE_OTHER): Payer: Self-pay | Admitting: *Deleted

## 2019-12-08 ENCOUNTER — Ambulatory Visit (INDEPENDENT_AMBULATORY_CARE_PROVIDER_SITE_OTHER): Payer: Medicare Other | Admitting: Gastroenterology

## 2019-12-08 ENCOUNTER — Other Ambulatory Visit: Payer: Self-pay

## 2019-12-08 DIAGNOSIS — R1084 Generalized abdominal pain: Secondary | ICD-10-CM | POA: Diagnosis not present

## 2019-12-08 DIAGNOSIS — R1310 Dysphagia, unspecified: Secondary | ICD-10-CM | POA: Diagnosis not present

## 2019-12-08 DIAGNOSIS — K625 Hemorrhage of anus and rectum: Secondary | ICD-10-CM | POA: Diagnosis not present

## 2019-12-08 DIAGNOSIS — R109 Unspecified abdominal pain: Secondary | ICD-10-CM | POA: Insufficient documentation

## 2019-12-08 MED ORDER — PLENVU 140 G PO SOLR: 1.0000 | Freq: Once | ORAL | 0 refills | Status: AC

## 2019-12-08 NOTE — Patient Instructions (Signed)
Schedule EGD with possible ED and colonoscopy with random colon biopsies - will need to obtain clearance to stop Xarelto (Dr. Jodelle Gross Assar, DO)

## 2019-12-08 NOTE — Telephone Encounter (Signed)
Patient needs Plenvu (copay card) ° °

## 2019-12-08 NOTE — Progress Notes (Signed)
Brent Ray, M.D. Gastroenterology & Hepatology [facility] [address] [place], [place] [address] Primary Care Physician: Troy Sine, MD [address] [place] [address]  Referring MD: PCP  I will communicate my assessment and recommendations to the referring MD via EMR. Note: Occasional unusual wording and randomly placed punctuation marks may result from the use of speech recognition technology to transcribe this document"  Chief Complaint:  dysphagia and episodes of rectal bleeding with fecal urgency.  History of Present Illness: Brent Ray. is a 74 y.o. male sick sinus syndrome status post pacemaker placement, atrial fibrillation on anticoagulation, hypothyroidism who presents for evaluation of dysphagia and episodes of rectal bleeding with fecal urgency.  Patient reports that 1 year and a half he noticed recurrent episodes of dysphagia to solids, usually when eating dry and pasty foods such as bread, chicken or meat.  He reports that this has progressed throughout time, but he is not having any dysphagia to liquids.  Endorses having dysphagia when swallowing his pills which he has been able to manage by crushing them.  Also endorses having occasional episodes of choking when eating solid food as " it will not go down his throat".  The patient denies having any chest or neck discomfort when he has episodes of dysphagia.  Due to this, he has had to change his diet significantly and has lost close to 6 pounds, he is also wearing smaller pants, had to change from size 34-32.  On the other hand, the patient also reports that for the last year he has had episodes of fecal urgency which were usually preceded by diffuse abdominal cramping.  He reported these episodes happen without any clear trigger but made him have a bowel movement emergently.  States that he usually has a loose bowel movement which  occasionally has a large amount of fresh blood.  After this, his abdominal pain completely resolves.  He is concerned as he has had also fecal soiling accidents.  The patient denies having any nausea, vomiting, fever, chills,  melena, hematemesis, abdominal distention, jaundice, heartburn, odynophagia, pruritus or weight loss.  Last EGD: Reports it was performed in 2016 due to episodes of dysphagia.  The patient does not know the findings but he states that his esophagus was "stretched".  This was performed in [place].  No reports are available. Last Colonoscopy: Normal per the patient.  Performing 2016 at [facility].  FHx: neg for any gastrointestinal/liver disease, mother and father had history of colon cancer diagnosed at age 37, multiple uncles had prostate cancer Social: Drinks 2-3 beers 2-3 times a week, neg smoking,or illicit drug use Surgical: Inguinal hernia  Past Medical History: Past Medical History:  Diagnosis Date  . Barrett's esophagus 2011   In [place]. Records in [place]  . Coronary atherosclerosis of native coronary artery    Nonobstructive  . Essential hypertension, benign   . Heart block AV first degree    Chronic  . Mixed hyperlipidemia   . NSVT (nonsustained ventricular tachycardia) (HCC)    Holter monitor, 2011; recommended RF ablation by [name] - never required  . PAF (paroxysmal atrial fibrillation) ([place])    [facility] 2016  . Sinus bradycardia    Symptomatic, acebutolol discontinued    Past Surgical History: Past Surgical History:  Procedure Laterality Date  . CARDIAC CATHETERIZATION N/A 12/13/2015   Procedure: Left Heart Cath and Coronary Angiography;  Surgeon: Lorretta Harp,  MD;  Location: [place] CV LAB;  Service: Cardiovascular;  Laterality: N/A;  . CARDIOVERSION N/A 04/11/2016   Procedure: CARDIOVERSION;  Surgeon: Skeet Latch, MD;  Location: [place];  Service: Cardiovascular;  Laterality: N/A;  . COLONOSCOPY   2014   [place], [place]--Dr. [place]  . EP IMPLANTABLE DEVICE N/A 02/01/2016   Procedure: Pacemaker Implant;  Surgeon: Thompson Grayer, MD;  Location: [place] CV LAB;  Service: Cardiovascular;  Laterality: N/A;  . ESOPHAGOGASTRODUODENOSCOPY  2014   [place], [place]--Dr. [place]  . HERNIA REPAIR     2  . KIDNEY STONE SURGERY    . LEFT HEART CATHETERIZATION WITH CORONARY ANGIOGRAM N/A 08/20/2012   Procedure: LEFT HEART CATHETERIZATION WITH CORONARY ANGIOGRAM;  Surgeon: [name], MD;  Location: [facility] CATH LAB;  Service: Cardiovascular;  Laterality: N/A;  . Left leg surgery  1979    Family History: Family History  Problem Relation Age of Onset  . Colon cancer Mother   . Other Sister         AGE-21-HEALTHY  . Other Sister        AGE 49-HEALTHY  . Other Sister        AGE 49 HEALTHY  . Other Son        AGE 46 HEALTHY    Social History: Social History   Tobacco Use  Smoking Status Never Smoker  Smokeless Tobacco Never Used   Social History   Substance and Sexual Activity  Alcohol Use Yes  . Alcohol/week: 2.0 standard drinks  . Types: 2 Cans of beer per week   Comment: Occasional beer   Social History   Substance and Sexual Activity  Drug Use No    Allergies: Allergies  Allergen Reactions  . Flomax [Tamsulosin Hcl] Hives and Itching    Itch, rash/hives  . Statins Other (See Comments)    Leg cramps    Medications: Current Outpatient Medications  Medication Sig Dispense Refill  . ALPRAZolam (XANAX XR) 0.5 MG 24 hr tablet Take 0.5 mg by mouth as needed for anxiety.    . carvedilol (COREG) 3.125 MG tablet Take 3.125 mg by mouth 2 (two) times daily with a meal.    . Cranberry 400 MG CAPS Take 4,200 mg by mouth daily.    . cyanocobalamin 2000 MCG tablet Take 2,000 mcg by mouth daily.    [name] Kitchen NITROSTAT 0.4 MG SL tablet DISSOLVE 1 TABLET UNDER TONGUE EVERY 5 MINUTES AS NEEDED FOR CHEST PAIN 25 tablet 3  . pantoprazole (PROTONIX) 40 MG tablet Take 40 mg by mouth 2 (two)  times daily.    . rivaroxaban (XARELTO) 20 MG TABS tablet Take 1 tablet (20 mg total) by mouth daily with supper. 90 tablet 3  . rosuvastatin (CRESTOR) 5 MG tablet Take 5 mg by mouth 2 (two) times a week.    . traMADol (ULTRAM) 50 MG tablet Take 50 mg by mouth every 6 (six) hours as needed.     No current facility-administered medications for this visit.    Review of Systems: GENERAL: negative for malaise, night sweats HEENT: No changes in hearing or vision, no nose bleeds or other nasal problems. NECK: Negative for lumps, goiter, pain and significant neck swelling RESPIRATORY: Negative for cough, wheezing CARDIOVASCULAR: Negative for chest pain, leg swelling, palpitations, orthopnea GI: SEE HPI MUSCULOSKELETAL: Negative for joint pain or swelling, back pain, and muscle pain. SKIN: Negative for lesions, rash PSYCH: Negative for sleep disturbance, mood disorder and recent psychosocial stressors. HEMATOLOGY Negative for prolonged bleeding, bruising  easily, and swollen nodes. ENDOCRINE: Negative for cold or heat intolerance, polyuria, polydipsia and goiter. NEURO: negative for tremor, gait imbalance, syncope and seizures. The remainder of the review of systems is noncontributory.   Physical Exam: BP 132/81 (BP Location: Right Arm, Patient Position: Sitting, Cuff Size: Normal)   Pulse 65   Temp 98.7 F (37.1 C) (Oral)   Ht 5\' 8"  (1.727 m)   Wt 137 lb 11.2 oz (62.5 kg)   BMI 20.94 kg/m  GENERAL: The patient is AO x3, in no acute distress. HEENT: Head is normocephalic and atraumatic. EOMI are intact. Mouth is well hydrated and without lesions. NECK: Supple. No masses LUNGS: Clear to auscultation. No presence of rhonchi/wheezing/rales. Adequate chest expansion HEART: RRR, normal s1 and s2. ABDOMEN: Soft, nontender, no guarding, no peritoneal signs, and nondistended. BS +. No masses. EXTREMITIES: Without any cyanosis, clubbing, rash, lesions or edema. NEUROLOGIC: AOx3, no focal motor  deficit. SKIN: no jaundice, no rashes  Imaging/Labs: as above  I personally reviewed and interpreted the available labs, imaging and endoscopic files.  Impression and Plan: Brent Ray. is a 74 y.o. male sick sinus syndrome status post pacemaker placement, atrial fibrillation on anticoagulation, hypothyroidism who presents for evaluation of dysphagia and episodes of rectal bleeding with fecal urgency.  The patient has presented recurrent episodes of dysphagia to solids with some weight loss but significant impact on his lifestyle as he has had to change his diet.  He has not presented any GERD symptoms but is unclear if he had some sort of a stricture in the past that had to be dilated.  Is currently on pantoprazole, which I advised him to continue taking until we perform his EGD with possible dilation, will also consider performing esophageal biopsies at that time.  On the other hand, he is having intermittent episodes of rectal bleeding and fecal urgency which will need to be further evaluated with a colonoscopy to rule out any sort of colitis or microscopic inflammation with random colon biopsies.  For this, will reach his cardiologist to hold his Xarelto 48 hours before the procedure.  The patient understood and agreed.  -Schedule EGD with possible ED and colonoscopy with random colon biopsies - will need to obtain clearance to hold Xarelto (Dr. Jodelle Gross Assar, DO)  All questions were answered.      Brent Peppers, MD Gastroenterology and Hepatology [facility]

## 2019-12-09 ENCOUNTER — Telehealth (INDEPENDENT_AMBULATORY_CARE_PROVIDER_SITE_OTHER): Payer: Self-pay | Admitting: *Deleted

## 2019-12-09 NOTE — Telephone Encounter (Signed)
Per Dr Candis Musa patient can stop Xarelto 2 days prior to Colonoscopy/EGD

## 2019-12-09 NOTE — Telephone Encounter (Signed)
Ok thanks.  Brent Peppers, MD Gastroenterology and Hepatology [facility]

## 2020-01-06 ENCOUNTER — Telehealth: Payer: Self-pay | Admitting: Internal Medicine

## 2020-01-06 NOTE — Telephone Encounter (Signed)
    Brent Ray from [facility] called, she said she faxed paperwork to [name] yesterday for him to sign and she is following up.

## 2020-01-06 NOTE — Telephone Encounter (Signed)
Device paperwork has be given to device clinic. They will fax back. Kayla notified.

## 2020-01-12 ENCOUNTER — Telehealth (INDEPENDENT_AMBULATORY_CARE_PROVIDER_SITE_OTHER): Payer: Self-pay | Admitting: *Deleted

## 2020-01-12 NOTE — Telephone Encounter (Signed)
Ok, thanks [place]. Please ask them to call back once these issues have been addressed.  Thanks,  Maylon Peppers, MD Gastroenterology and Hepatology [facility]

## 2020-01-12 NOTE — Telephone Encounter (Signed)
Patient's wife called - left message to cancel procedures - he is having issues with his pace maker

## 2020-01-14 ENCOUNTER — Encounter (HOSPITAL_COMMUNITY): Admission: RE | Admit: 2020-01-14 | Payer: Medicare Other | Source: Ambulatory Visit

## 2020-01-14 ENCOUNTER — Other Ambulatory Visit (HOSPITAL_COMMUNITY): Payer: Medicare Other

## 2020-01-16 ENCOUNTER — Ambulatory Visit (HOSPITAL_COMMUNITY): Admission: RE | Admit: 2020-01-16 | Payer: Medicare Other | Source: Home / Self Care | Admitting: Gastroenterology

## 2020-01-16 ENCOUNTER — Encounter (HOSPITAL_COMMUNITY): Admission: RE | Payer: Self-pay | Source: Home / Self Care

## 2020-01-16 SURGERY — COLONOSCOPY WITH PROPOFOL
Anesthesia: Monitor Anesthesia Care

## 2020-02-09 ENCOUNTER — Ambulatory Visit (INDEPENDENT_AMBULATORY_CARE_PROVIDER_SITE_OTHER): Payer: Medicare Other | Admitting: Gastroenterology

## 2021-12-20 ENCOUNTER — Ambulatory Visit: Payer: Self-pay | Admitting: Student

## 2022-01-02 ENCOUNTER — Ambulatory Visit: Payer: Self-pay | Admitting: Student

## 2022-01-02 NOTE — H&P (View-Only) (Signed)
TOTAL HIP ADMISSION H&P  Patient is admitted for left total hip arthroplasty.  Subjective:  Chief Complaint: left hip pain  HPI: Brent Cover., 76 y.o. male, has a history of pain and functional disability in the left hip(s) due to  avascular necrosis with collapse  and patient has failed non-surgical conservative treatments for greater than 12 weeks to include corticosteriod injections, flexibility and strengthening excercises, supervised PT with diminished ADL's post treatment, and activity modification.  Onset of symptoms was gradual starting 9 years ago with rapidlly worsening course since that time.The patient noted no past surgery on the left hip(s).  Patient currently rates pain in the left hip at 10 out of 10 with activity. Patient has night pain, worsening of pain with activity and weight bearing, trendelenberg gait, pain that interfers with activities of daily living, and pain with passive range of motion. Patient has evidence of subchondral cysts, subchondral sclerosis, joint space narrowing, and avascular necrosis with collapse  by imaging studies. This condition presents safety issues increasing the risk of falls.  There is no current active infection.  Patient Active Problem List   Diagnosis Date Noted   Dysphagia 12/08/2019   Abdominal pain 12/08/2019   Rectal bleeding 12/08/2019   Persistent atrial fibrillation ([place]) 02/29/2016   History of panic attacks 02/29/2016   Second degree AV block 02/01/2016   D-dimer, elevated    Permanent atrial fibrillation (HCC)    NICM (nonischemic cardiomyopathy) ([place])    Anticoagulated    Ischemic chest pain ([place]) 12/13/2015   Fatigue 11/09/2014   Internal hemorrhoids 10/31/2013   Esophageal reflux 10/31/2013   CAD in native artery    Sinus bradycardia    Heart block AV first degree    NSVT (nonsustained ventricular tachycardia) ([place])    Mixed hyperlipidemia    Bradycardia 08/21/2012   Chest pain 01/21/2012   PAF (paroxysmal  atrial fibrillation) ([place]) 04/11/2010   PALPITATIONS 03/02/2010   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 Records in [facility]   Coronary atherosclerosis of native coronary artery    Nonobstructive   Essential hypertension, benign    Heart block AV first degree    Chronic   Mixed hyperlipidemia    NSVT (nonsustained ventricular tachycardia) (HCC)    Holter monitor, 2011; recommended RF ablation by [name] - never required   PAF (paroxysmal atrial fibrillation) [facility])    Recently documented in [facility] June 2016   Sinus bradycardia    Symptomatic, acebutolol discontinued    Past Surgical History:  Procedure Laterality Date   CARDIAC CATHETERIZATION N/A 12/13/2015   Procedure: Left Heart Cath and Coronary Angiography;  Surgeon: Lorretta Harp, MD;  Location: [place] CV [facility];  Service: Cardiovascular;  Laterality: N/A;   CARDIOVERSION N/A 04/11/2016   Procedure: CARDIOVERSION;  Surgeon: Skeet Latch, MD;  Location: [facility] ENDOSCOPY;  Service: Cardiovascular;  Laterality: N/A;   COLONOSCOPY  2014   [place], [place]--Dr. [place]   EP IMPLANTABLE DEVICE N/A 02/01/2016   Procedure: Pacemaker Implant;  Surgeon: Thompson Grayer, MD;  Location: [place] CV LAB;  Service: Cardiovascular;  Laterality: N/A;   ESOPHAGOGASTRODUODENOSCOPY  2014   [place], [place]--Dr. [place]     2   KIDNEY STONE SURGERY     LEFT HEART CATHETERIZATION WITH CORONARY ANGIOGRAM N/A 08/20/2012   Procedure: LEFT HEART CATHETERIZATION WITH CORONARY ANGIOGRAM;  Surgeon: [name],  MD;  Location: [place] CATH LAB;  Service: Cardiovascular;  Laterality: N/A;   Left leg surgery  1979    Current Outpatient Medications  Medication Sig Dispense Refill Last Dose   ALPRAZolam (XANAX XR) 0.5 MG 24 hr tablet Take 0.5 mg by mouth as needed for anxiety.      carvedilol (COREG) 3.125 MG tablet Take  3.125 mg by mouth 2 (two) times daily with a meal.      Cranberry 400 MG CAPS Take 4,200 mg by mouth daily.      cyanocobalamin 2000 MCG tablet Take 2,000 mcg by mouth daily.      NITROSTAT 0.4 MG SL tablet DISSOLVE 1 TABLET UNDER TONGUE EVERY 5 MINUTES AS NEEDED FOR CHEST PAIN 25 tablet 3    pantoprazole (PROTONIX) 40 MG tablet Take 40 mg by mouth 2 (two) times daily.      rivaroxaban (XARELTO) 20 MG TABS tablet Take 1 tablet (20 mg total) by mouth daily with supper. 90 tablet 3    rosuvastatin (CRESTOR) 5 MG tablet Take 5 mg by mouth 2 (two) times a week.      traMADol (ULTRAM) 50 MG tablet Take 50 mg by mouth every 6 (six) hours as needed.      No current facility-administered medications for this visit.   Allergies  Allergen Reactions   Flomax [Tamsulosin Hcl] Hives and Itching    Itch, rash/hives   Statins Other (See Comments)    Leg cramps    Social History   Tobacco Use   Smoking status: Never   Smokeless tobacco: Never  Substance Use Topics   Alcohol use: Yes    Alcohol/week: 2.0 standard drinks of alcohol    Types: 2 Cans of beer per week    Comment: Occasional beer    Family History  Problem Relation Age of Onset   Colon cancer Mother    Other Sister         AGE-92-HEALTHY   Other Sister        AGE 57-HEALTHY   Other Sister        AGE 31 HEALTHY   Other Son        AGE 28 HEALTHY     Review of Systems  Musculoskeletal:  Positive for arthralgias and gait problem.  All other systems reviewed and are negative.   Objective:  Physical Exam Constitutional:      Appearance: Normal appearance.  HENT:     Head: Normocephalic and atraumatic.     Nose: Nose normal.     Mouth/Throat:     Mouth: Mucous membranes are moist.     Pharynx: Oropharynx is clear.  Eyes:     Conjunctiva/sclera: Conjunctivae normal.  Cardiovascular:     Rate and Rhythm: Normal rate and regular rhythm.     Pulses: Normal pulses.     Heart sounds: Normal heart sounds.  Pulmonary:      Effort: Pulmonary effort is normal.     Breath sounds: Normal breath sounds.  Abdominal:     General: Abdomen is flat.     Palpations: Abdomen is soft.  Genitourinary:    Comments: deferred Musculoskeletal:     Cervical back: Normal range of motion and neck supple.     Comments: Examination left hip reveals mild trochanteric tenderness to palpation. He has mildly restricted range of motion. Pain with terminal flexion and rotation. Pain in the position of impingement.  Neurovascularly intact distally.   Skin:    General: Skin is  warm and dry.     Capillary Refill: Capillary refill takes less than 2 seconds.  Neurological:     General: No focal deficit present.     Mental Status: He is alert and oriented to person, place, and time.  Psychiatric:        Mood and Affect: Mood normal.        Behavior: Behavior normal.        Thought Content: Thought content normal.        Judgment: Judgment normal.     Vital signs in last 24 hours: '@VSRANGES'$ @  Labs:   Estimated body mass index is 20.94 kg/m as calculated from the following:   Height as of 12/08/19: '5\' 8"'$  (1.727 m).   Weight as of 12/08/19: 62.5 kg.   Imaging Review Plain radiographs demonstrate severe degenerative joint disease of the left hip(s). The bone quality appears to be poor for age and reported activity level.      Assessment/Plan:  End stage avascular necrosis with collapse, left hip(s)  The patient history, physical examination, clinical judgement of the provider and imaging studies are consistent with end stage degenerative joint disease of the left hip(s) and total hip arthroplasty is deemed medically necessary. The treatment options including medical management, injection therapy, arthroscopy and arthroplasty were discussed at length. The risks and benefits of total hip arthroplasty were presented and reviewed. The risks due to aseptic loosening, infection, stiffness, dislocation/subluxation,  thromboembolic  complications and other imponderables were discussed.  The patient acknowledged the explanation, agreed to proceed with the plan and consent was signed. Patient is being admitted for inpatient treatment for surgery, pain control, PT, OT, prophylactic antibiotics, VTE prophylaxis, progressive ambulation and ADL's and discharge planning.The patient is planning to be discharged home after an overnight stay with HEP.   Therapy Plans: [facility].  Disposition: Home with sister Planned DVT Prophylaxis: xarelto '15mg'$  at baseline DME needed: walker.  PCP: Cleared Cardiology: Cleared. Xarelto stop 48 hours prior to surgery date.  TXA: IV Allergies:  - Flomax - dizzy, decreased kidney function - Entresto - vomiting - Lisinopril - vomiting - Sulfa - vomiting.  Anesthesia Concerns: None.  BMI: 21.1 Last HgbA1c: 6.1 Other: - CKD. Has an appointment for nephrologist 10/30.   - CHF, Afib on xarelto '15mg'$  at baseline, pacemaker. - Hydrocodone, zofran. - Cr 1.74, Hgb 14.3, K+ 3.8, Albumin 3.4.  - NO NSAIDs.     Patient's anticipated LOS is less than 2 midnights, meeting these requirements: - Younger than 15 - Lives within 1 hour of care - Has a competent adult at home to recover with post-op recover - NO history of  - Chronic pain requiring opiods  - Diabetes  - Coronary Artery Disease  - Heart failure  - Heart attack  - Stroke  - DVT/VTE  - Cardiac arrhythmia  - Respiratory Failure/COPD  - Renal failure  - Anemia  - Advanced Liver disease

## 2022-01-02 NOTE — H&P (Signed)
TOTAL HIP ADMISSION H&P  Patient is admitted for left total hip arthroplasty.  Subjective:  Chief Complaint: left hip pain  HPI: Brent Cover., 76 y.o. male, has a history of pain and functional disability in the left hip(s) due to  avascular necrosis with collapse  and patient has failed non-surgical conservative treatments for greater than 12 weeks to include corticosteriod injections, flexibility and strengthening excercises, supervised PT with diminished ADL's post treatment, and activity modification.  Onset of symptoms was gradual starting 9 years ago with rapidlly worsening course since that time.The patient noted no past surgery on the left hip(s).  Patient currently rates pain in the left hip at 10 out of 10 with activity. Patient has night pain, worsening of pain with activity and weight bearing, trendelenberg gait, pain that interfers with activities of daily living, and pain with passive range of motion. Patient has evidence of subchondral cysts, subchondral sclerosis, joint space narrowing, and avascular necrosis with collapse  by imaging studies. This condition presents safety issues increasing the risk of falls.  There is no current active infection.  Patient Active Problem List   Diagnosis Date Noted   Dysphagia 12/08/2019   Abdominal pain 12/08/2019   Rectal bleeding 12/08/2019   Persistent atrial fibrillation ([place]) 02/29/2016   History of panic attacks 02/29/2016   Second degree AV block 02/01/2016   D-dimer, elevated    Permanent atrial fibrillation (HCC)    NICM (nonischemic cardiomyopathy) ([place])    Anticoagulated    Ischemic chest pain ([place]) 12/13/2015   Fatigue 11/09/2014   Internal hemorrhoids 10/31/2013   Esophageal reflux 10/31/2013   CAD in native artery    Sinus bradycardia    Heart block AV first degree    NSVT (nonsustained ventricular tachycardia) ([place])    Mixed hyperlipidemia    Bradycardia 08/21/2012   Chest pain 01/21/2012   PAF (paroxysmal  atrial fibrillation) ([place]) 04/11/2010   PALPITATIONS 03/02/2010   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 Records in Epic   Coronary atherosclerosis of native coronary artery    Nonobstructive   Essential hypertension, benign    Heart block AV first degree    Chronic   Mixed hyperlipidemia    NSVT (nonsustained ventricular tachycardia) (HCC)    Holter monitor, 2011; recommended RF ablation by [name] - never required   PAF (paroxysmal atrial fibrillation) [facility])    Recently documented in [facility] June 2016   Sinus bradycardia    Symptomatic, acebutolol discontinued    Past Surgical History:  Procedure Laterality Date   CARDIAC CATHETERIZATION N/A 12/13/2015   Procedure: Left Heart Cath and Coronary Angiography;  Surgeon: Lorretta Harp, MD;  Location: [facility];  Service: Cardiovascular;  Laterality: N/A;   CARDIOVERSION N/A 04/11/2016   Procedure: CARDIOVERSION;  Surgeon: Skeet Latch, MD;  Location: [facility] ENDOSCOPY;  Service: Cardiovascular;  Laterality: N/A;   COLONOSCOPY  2014   [place], [place]--Dr. Spainhour   EP IMPLANTABLE DEVICE N/A 02/01/2016   Procedure: Pacemaker Implant;  Surgeon: Thompson Grayer, MD;  Location: [facility];  Service: Cardiovascular;  Laterality: N/A;   ESOPHAGOGASTRODUODENOSCOPY  2014   [place], [place]--Dr. Hotchkiss     2   KIDNEY STONE SURGERY     LEFT HEART CATHETERIZATION WITH CORONARY ANGIOGRAM N/A 08/20/2012   Procedure: LEFT HEART CATHETERIZATION WITH CORONARY ANGIOGRAM;  Surgeon: [name],  MD;  Location: [facility];  Service: Cardiovascular;  Laterality: N/A;   Left leg surgery  1979    Current Outpatient Medications  Medication Sig Dispense Refill Last Dose   ALPRAZolam (XANAX XR) 0.5 MG 24 hr tablet Take 0.5 mg by mouth as needed for anxiety.      carvedilol (COREG) 3.125 MG tablet Take  3.125 mg by mouth 2 (two) times daily with a meal.      Cranberry 400 MG CAPS Take 4,200 mg by mouth daily.      cyanocobalamin 2000 MCG tablet Take 2,000 mcg by mouth daily.      NITROSTAT 0.4 MG SL tablet DISSOLVE 1 TABLET UNDER TONGUE EVERY 5 MINUTES AS NEEDED FOR CHEST PAIN 25 tablet 3    pantoprazole (PROTONIX) 40 MG tablet Take 40 mg by mouth 2 (two) times daily.      rivaroxaban (XARELTO) 20 MG TABS tablet Take 1 tablet (20 mg total) by mouth daily with supper. 90 tablet 3    rosuvastatin (CRESTOR) 5 MG tablet Take 5 mg by mouth 2 (two) times a week.      traMADol (ULTRAM) 50 MG tablet Take 50 mg by mouth every 6 (six) hours as needed.      No current facility-administered medications for this visit.   Allergies  Allergen Reactions   Flomax [Tamsulosin Hcl] Hives and Itching    Itch, rash/hives   Statins Other (See Comments)    Leg cramps    Social History   Tobacco Use   Smoking status: Never   Smokeless tobacco: Never  Substance Use Topics   Alcohol use: Yes    Alcohol/week: 2.0 standard drinks of alcohol    Types: 2 Cans of beer per week    Comment: Occasional beer    Family History  Problem Relation Age of Onset   Colon cancer Mother    Other Sister         AGE-26-HEALTHY   Other Sister        AGE 86-HEALTHY   Other Sister        AGE 23 HEALTHY   Other Son        AGE 42 HEALTHY     Review of Systems  Musculoskeletal:  Positive for arthralgias and gait problem.  All other systems reviewed and are negative.   Objective:  Physical Exam Constitutional:      Appearance: Normal appearance.  HENT:     Head: Normocephalic and atraumatic.     Nose: Nose normal.     Mouth/Throat:     Mouth: Mucous membranes are moist.     Pharynx: Oropharynx is clear.  Eyes:     Conjunctiva/sclera: Conjunctivae normal.  Cardiovascular:     Rate and Rhythm: Normal rate and regular rhythm.     Pulses: Normal pulses.     Heart sounds: Normal heart sounds.  Pulmonary:      Effort: Pulmonary effort is normal.     Breath sounds: Normal breath sounds.  Abdominal:     General: Abdomen is flat.     Palpations: Abdomen is soft.  Genitourinary:    Comments: deferred Musculoskeletal:     Cervical back: Normal range of motion and neck supple.     Comments: Examination left hip reveals mild trochanteric tenderness to palpation. He has mildly restricted range of motion. Pain with terminal flexion and rotation. Pain in the position of impingement.  Neurovascularly intact distally.   Skin:    General: Skin is  warm and dry.     Capillary Refill: Capillary refill takes less than 2 seconds.  Neurological:     General: No focal deficit present.     Mental Status: He is alert and oriented to person, place, and time.  Psychiatric:        Mood and Affect: Mood normal.        Behavior: Behavior normal.        Thought Content: Thought content normal.        Judgment: Judgment normal.     Vital signs in last 24 hours: '@VSRANGES'$ @  Labs:   Estimated body mass index is 20.94 kg/m as calculated from the following:   Height as of 12/08/19: '5\' 8"'$  (1.727 m).   Weight as of 12/08/19: 62.5 kg.   Imaging Review Plain radiographs demonstrate severe degenerative joint disease of the left hip(s). The bone quality appears to be poor for age and reported activity level.      Assessment/Plan:  End stage avascular necrosis with collapse, left hip(s)  The patient history, physical examination, clinical judgement of the provider and imaging studies are consistent with end stage degenerative joint disease of the left hip(s) and total hip arthroplasty is deemed medically necessary. The treatment options including medical management, injection therapy, arthroscopy and arthroplasty were discussed at length. The risks and benefits of total hip arthroplasty were presented and reviewed. The risks due to aseptic loosening, infection, stiffness, dislocation/subluxation,  thromboembolic  complications and other imponderables were discussed.  The patient acknowledged the explanation, agreed to proceed with the plan and consent was signed. Patient is being admitted for inpatient treatment for surgery, pain control, PT, OT, prophylactic antibiotics, VTE prophylaxis, progressive ambulation and ADL's and discharge planning.The patient is planning to be discharged home after an overnight stay with HEP.   Therapy Plans: HEP.  Disposition: Home with sister Planned DVT Prophylaxis: xarelto '15mg'$  at baseline DME needed: walker.  PCP: Cleared Cardiology: Cleared. Xarelto stop 48 hours prior to surgery date.  TXA: IV Allergies:  - Flomax - dizzy, decreased kidney function - Entresto - vomiting - Lisinopril - vomiting - Sulfa - vomiting.  Anesthesia Concerns: None.  BMI: 21.1 Last HgbA1c: 6.1 Other: - CKD. Has an appointment for nephrologist 10/30.   - CHF, Afib on xarelto '15mg'$  at baseline, pacemaker. - Hydrocodone, zofran. - Cr 1.74, Hgb 14.3, K+ 3.8, Albumin 3.4.  - NO NSAIDs.     Patient's anticipated LOS is less than 2 midnights, meeting these requirements: - Younger than 26 - Lives within 1 hour of care - Has a competent adult at home to recover with post-op recover - NO history of  - Chronic pain requiring opiods  - Diabetes  - Coronary Artery Disease  - Heart failure  - Heart attack  - Stroke  - DVT/VTE  - Cardiac arrhythmia  - Respiratory Failure/COPD  - Renal failure  - Anemia  - Advanced Liver disease

## 2022-01-04 ENCOUNTER — Encounter (HOSPITAL_COMMUNITY): Payer: Self-pay

## 2022-01-04 NOTE — Progress Notes (Addendum)
COVID Vaccine Completed:  Yes x3  Date of COVID positive in last 90 days:  No  PCP - Moshe Cipro, MD Cardiologist - Raechel Chute, MD  Chest x-ray - 06-25-21 [facility] EKG - 06-25-21 on chart Stress Test - 2015 Epic ECHO - 06-24-21 [facility] Cardiac Cath - 2017 Epic Pacemaker/ICD device last checked:  2 months ago per patient.  Device orders on chart.  Rep notified of orders  Spinal Cord Stimulator:N/A Cardiac MRI - 03-31-21 [facility]  Bowel Prep - N/A  Sleep Study - Yes, neg per patient CPAP - No  Prediabetes Fasting Blood Sugar -  Checks Blood Sugar - does not check   Blood Thinner Instructions:  Xarelto.  Hold x3 days.  Patient aware Aspirin Instructions: Last Dose:  Activity level:  Can go up a flight of stairs and perform activities of daily living without stopping and without symptoms of chest pain or shortness of breath.  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 Patient was also instructed that they will need to review over the PAT instructions again at home before surgery.

## 2022-01-04 NOTE — Patient Instructions (Addendum)
SURGICAL WAITING ROOM VISITATION Patients having surgery or a procedure may have no more than 2 support people in the waiting area - these visitors may rotate.   Children under the age of 43 must have an adult with them who is not the patient. If the patient needs to stay at the hospital during part of their recovery, the visitor guidelines for inpatient rooms apply. Pre-op nurse will coordinate an appropriate time for 1 support person to accompany patient in pre-op.  This support person may not rotate.    Please refer to the [facility] website for the visitor guidelines for Inpatients (after your surgery is over and you are in a regular room).      Your procedure is scheduled on: 01-11-22   Report to [facility] Main Entrance    Report to admitting at 6:15 AM   Call this number if you have problems the morning of surgery [phone]   Do not eat food :After Midnight.   After Midnight you may have the following liquids until 5:30 AM DAY OF SURGERY  Water Non-Citrus Juices (without pulp, NO RED) Carbonated Beverages Black Coffee (NO MILK/CREAM OR CREAMERS, sugar ok)  Clear Tea (NO MILK/CREAM OR CREAMERS, sugar ok) regular and decaf                             Plain Jell-O (NO RED)                                           Fruit ices (not with fruit pulp, NO RED)                                     Popsicles (NO RED)                                                               Sports drinks like Gatorade (NO RED)                   The day of surgery:  Drink ONE (1) Pre-Surgery G2 at 5:30 AM the morning of surgery. Drink in one sitting. Do not sip.  This drink was given to you during your hospital  pre-op appointment visit. Nothing else to drink after completing the Pre-Surgery G2.          If you have questions, please contact your surgeon's office.   FOLLOW  ANY ADDITIONAL PRE OP INSTRUCTIONS YOU RECEIVED FROM YOUR SURGEON'S OFFICE!!!     Oral Hygiene is also  important to reduce your risk of infection.                                    Remember - BRUSH YOUR TEETH THE MORNING OF SURGERY WITH YOUR REGULAR TOOTHPASTE   Do NOT smoke after Midnight   Take these medicines the morning of surgery with A SIP OF WATER: Carvedilol Panatoprazole Rosuvastatin Tramadol if needed  You may not have any metal on your body including  jewelry, and body piercing             Do not wear lotions, powders, cologne, or deodorant              Men may shave face and neck.   Do not bring valuables to the hospital. [place].   Contacts, dentures or bridgework may not be worn into surgery.   Bring small overnight bag day of surgery.   DO NOT [place]. PHARMACY WILL DISPENSE MEDICATIONS LISTED ON YOUR MEDICATION LIST TO YOU DURING YOUR ADMISSION [place]!                Please read over the following fact sheets you were given: IF [place]  If you received a COVID test during your pre-op visit  it is requested that you wear a mask when out in public, stay away from anyone that may not be feeling well and notify your surgeon if you develop symptoms. If you test positive for Covid or have been in contact with anyone that has tested positive in the last 10 days please notify you surgeon.  [place] [facility] - Preparing for Surgery Before surgery, you can play an important role.  Because skin is not sterile, your skin needs to be as free of germs as possible.  You can reduce the number of germs on your skin by washing with CHG (chlorahexidine gluconate) soap before surgery.  CHG is an antiseptic cleaner which kills germs and bonds with the skin to continue killing germs even after washing. Please DO NOT use if you have an allergy to CHG or antibacterial soaps.  If your skin becomes reddened/irritated  stop using the CHG and inform your nurse when you arrive at Short Stay. Do not shave (including legs and underarms) for at least 48 hours prior to the first CHG shower.  You may shave your face/neck.  Please follow these instructions carefully:  1.  Shower with CHG Soap the night before surgery and the  morning of surgery.  2.  If you choose to wash your hair, wash your hair first as usual with your normal  shampoo.  3.  After you shampoo, rinse your hair and body thoroughly to remove the shampoo.                             4.  Use CHG as you would any other liquid soap.  You can apply chg directly to the skin and wash.  Gently with a scrungie or clean washcloth.  5.  Apply the CHG Soap to your body ONLY FROM THE NECK DOWN.   Do   not use on face/ open                           Wound or open sores. Avoid contact with eyes, ears mouth and   genitals (private parts).                       Wash face,  Genitals (private parts) with your normal soap.             6.  Wash thoroughly, paying special attention to the area where your  surgery  will be performed.  7.  Thoroughly rinse your body with warm water from the neck down.  8.  DO NOT shower/wash with your normal soap after using and rinsing off the CHG Soap.                9.  Pat yourself dry with a clean towel.            10.  Wear clean pajamas.            11.  Place clean sheets on your bed the night of your first shower and do not  sleep with pets. Day of Surgery : Do not apply any lotions/deodorants the morning of surgery.  Please wear clean clothes to the hospital/surgery center.  FAILURE TO FOLLOW THESE INSTRUCTIONS MAY RESULT IN THE CANCELLATION OF YOUR SURGERY  PATIENT SIGNATURE_________________________________  NURSE SIGNATURE__________________________________  ________________________________________________________________________     Adam Phenix  An incentive spirometer is a tool that can help keep your lungs  clear and active. This tool measures how well you are filling your lungs with each breath. Taking long deep breaths may help reverse or decrease the chance of developing breathing (pulmonary) problems (especially infection) following: A long period of time when you are unable to move or be active. BEFORE THE PROCEDURE  If the spirometer includes an indicator to show your best effort, your nurse or respiratory therapist will set it to a desired goal. If possible, sit up straight or lean slightly forward. Try not to slouch. Hold the incentive spirometer in an upright position. INSTRUCTIONS FOR USE  Sit on the edge of your bed if possible, or sit up as far as you can in bed or on a chair. Hold the incentive spirometer in an upright position. Breathe out normally. Place the mouthpiece in your mouth and seal your lips tightly around it. Breathe in slowly and as deeply as possible, raising the piston or the ball toward the top of the column. Hold your breath for 3-5 seconds or for as long as possible. Allow the piston or ball to fall to the bottom of the column. Remove the mouthpiece from your mouth and breathe out normally. Rest for a few seconds and repeat Steps 1 through 7 at least 10 times every 1-2 hours when you are awake. Take your time and take a few normal breaths between deep breaths. The spirometer may include an indicator to show your best effort. Use the indicator as a goal to work toward during each repetition. After each set of 10 deep breaths, practice coughing to be sure your lungs are clear. If you have an incision (the cut made at the time of surgery), support your incision when coughing by placing a pillow or rolled up towels firmly against it. Once you are able to get out of bed, walk around indoors and cough well. You may stop using the incentive spirometer when instructed by your caregiver.  RISKS AND COMPLICATIONS Take your time so you do not get dizzy or light-headed. If you  are in pain, you may need to take or ask for pain medication before doing incentive spirometry. It is harder to take a deep breath if you are having pain. AFTER USE Rest and breathe slowly and easily. It can be helpful to keep track of a log of your progress. Your caregiver can provide you with a simple table to help with this. If you are using the spirometer at home, follow these instructions: [facility]  CARE IF:  You are having difficultly using the spirometer. You have trouble using the spirometer as often as instructed. Your pain medication is not giving enough relief while using the spirometer. You develop fever of 100.5 F (38.1 C) or higher. SEEK IMMEDIATE MEDICAL CARE IF:  You cough up bloody sputum that had not been present before. You develop fever of 102 F (38.9 C) or greater. You develop worsening pain at or near the incision site. MAKE SURE YOU:  Understand these instructions. Will watch your condition. Will get help right away if you are not doing well or get worse. Document Released: 07/10/2006 Document Revised: 05/22/2011 Document Reviewed: 09/10/2006 ExitCare Patient Information 2014 Exit[employer], [place].   ________________________________________________________________________  WHAT IS A BLOOD TRANSFUSION? Blood Transfusion Information  A transfusion is the replacement of blood or some of its parts. Blood is made up of multiple cells which provide different functions. Red blood cells carry oxygen and are used for blood loss replacement. White blood cells fight against infection. Platelets control bleeding. Plasma helps clot blood. Other blood products are available for specialized needs, such as hemophilia or other clotting disorders. BEFORE THE TRANSFUSION  Who gives blood for transfusions?  Healthy volunteers who are fully evaluated to make sure their blood is safe. This is blood bank blood. Transfusion therapy is the safest it has ever been in the practice of  medicine. Before blood is taken from a donor, a complete history is taken to make sure that person has no history of diseases nor engages in risky social behavior (examples are intravenous drug use or sexual activity with multiple partners). The donor's travel history is screened to minimize risk of transmitting infections, such as malaria. The donated blood is tested for signs of infectious diseases, such as HIV and hepatitis. The blood is then tested to be sure it is compatible with you in order to minimize the chance of a transfusion reaction. If you or a relative donates blood, this is often done in anticipation of surgery and is not appropriate for emergency situations. It takes many days to process the donated blood. RISKS AND COMPLICATIONS Although transfusion therapy is very safe and saves many lives, the main dangers of transfusion include:  Getting an infectious disease. Developing a transfusion reaction. This is an allergic reaction to something in the blood you were given. Every precaution is taken to prevent this. The decision to have a blood transfusion has been considered carefully by your caregiver before blood is given. Blood is not given unless the benefits outweigh the risks. AFTER THE TRANSFUSION Right after receiving a blood transfusion, you will usually feel much better and more energetic. This is especially true if your red blood cells have gotten low (anemic). The transfusion raises the level of the red blood cells which carry oxygen, and this usually causes an energy increase. The nurse administering the transfusion will monitor you carefully for complications. HOME CARE INSTRUCTIONS  No special instructions are needed after a transfusion. You may find your energy is better. Speak with your caregiver about any limitations on activity for underlying diseases you may have. SEEK MEDICAL CARE IF:  Your condition is not improving after your transfusion. You develop redness or  irritation at the intravenous (IV) site. SEEK IMMEDIATE MEDICAL CARE IF:  Any of the following symptoms occur over the next 12 hours: Shaking chills. You have a temperature by mouth above 102 F (38.9 C), not controlled by medicine. Chest, back, or muscle pain. People around you feel  you are not acting correctly or are confused. Shortness of breath or difficulty breathing. Dizziness and fainting. You get a rash or develop hives. You have a decrease in urine output. Your urine turns a dark color or changes to pink, red, or brown. Any of the following symptoms occur over the next 10 days: You have a temperature by mouth above 102 F (38.9 C), not controlled by medicine. Shortness of breath. Weakness after normal activity. The white part of the eye turns yellow (jaundice). You have a decrease in the amount of urine or are urinating less often. Your urine turns a dark color or changes to pink, red, or brown. Document Released: 02/25/2000 Document Revised: 05/22/2011 Document Reviewed: 10/14/2007 [facility] Patient Information 2014 [place], [place].  _______________________________________________________________________

## 2022-01-06 ENCOUNTER — Other Ambulatory Visit: Payer: Self-pay

## 2022-01-06 ENCOUNTER — Encounter (HOSPITAL_COMMUNITY): Payer: Self-pay

## 2022-01-06 ENCOUNTER — Encounter (HOSPITAL_COMMUNITY)
Admission: RE | Admit: 2022-01-06 | Discharge: 2022-01-06 | Disposition: A | Discharge status: Home / Self Care | Payer: Medicare PPO | Source: Ambulatory Visit | Attending: Orthopedic Surgery | Admitting: Orthopedic Surgery

## 2022-01-06 VITALS — BP 149/91 | HR 80 | Temp 97.9°F | Resp 16 | Ht 68.0 in | Wt 136.4 lb

## 2022-01-06 DIAGNOSIS — Z01812 Encounter for preprocedural laboratory examination: Secondary | ICD-10-CM | POA: Diagnosis present

## 2022-01-06 DIAGNOSIS — I130 Hypertensive heart and chronic kidney disease with heart failure and stage 1 through stage 4 chronic kidney disease, or unspecified chronic kidney disease: Secondary | ICD-10-CM | POA: Insufficient documentation

## 2022-01-06 DIAGNOSIS — Z95810 Presence of automatic (implantable) cardiac defibrillator: Secondary | ICD-10-CM | POA: Insufficient documentation

## 2022-01-06 DIAGNOSIS — N189 Chronic kidney disease, unspecified: Secondary | ICD-10-CM | POA: Insufficient documentation

## 2022-01-06 DIAGNOSIS — R7303 Prediabetes: Secondary | ICD-10-CM | POA: Diagnosis not present

## 2022-01-06 DIAGNOSIS — I420 Dilated cardiomyopathy: Secondary | ICD-10-CM | POA: Insufficient documentation

## 2022-01-06 DIAGNOSIS — I442 Atrioventricular block, complete: Secondary | ICD-10-CM | POA: Insufficient documentation

## 2022-01-06 DIAGNOSIS — I509 Heart failure, unspecified: Secondary | ICD-10-CM | POA: Insufficient documentation

## 2022-01-06 DIAGNOSIS — Z01818 Encounter for other preprocedural examination: Secondary | ICD-10-CM

## 2022-01-06 DIAGNOSIS — G4733 Obstructive sleep apnea (adult) (pediatric): Secondary | ICD-10-CM | POA: Diagnosis not present

## 2022-01-06 DIAGNOSIS — I2510 Atherosclerotic heart disease of native coronary artery without angina pectoris: Secondary | ICD-10-CM | POA: Diagnosis not present

## 2022-01-06 DIAGNOSIS — M879 Osteonecrosis, unspecified: Secondary | ICD-10-CM | POA: Insufficient documentation

## 2022-01-06 DIAGNOSIS — I4821 Permanent atrial fibrillation: Secondary | ICD-10-CM | POA: Insufficient documentation

## 2022-01-06 HISTORY — DX: Unspecified malignant neoplasm of skin, unspecified: C44.90

## 2022-01-06 HISTORY — DX: Pure hypercholesterolemia, unspecified: E78.00

## 2022-01-06 HISTORY — DX: Unspecified atrial fibrillation: I48.91

## 2022-01-06 HISTORY — DX: Gastro-esophageal reflux disease without esophagitis: K21.9

## 2022-01-06 HISTORY — DX: Personal history of urinary calculi: Z87.442

## 2022-01-06 HISTORY — DX: Prediabetes: R73.03

## 2022-01-06 HISTORY — DX: Sleep apnea, unspecified: G47.30

## 2022-01-06 HISTORY — DX: Rheumatoid arthritis, unspecified: M06.9

## 2022-01-06 HISTORY — DX: Chronic kidney disease, unspecified: N18.9

## 2022-01-06 HISTORY — DX: Heart failure, unspecified: I50.9

## 2022-01-06 LAB — BASIC METABOLIC PANEL
Anion gap: 8 (ref 5–15)
BUN: 11 mg/dL (ref 8–23)
CO2: 25 mmol/L (ref 22–32)
Calcium: 9.6 mg/dL (ref 8.9–10.3)
Chloride: 110 mmol/L (ref 98–111)
Creatinine, Ser: 1.59 mg/dL — ABNORMAL HIGH (ref 0.61–1.24)
GFR, Estimated: 45 mL/min — ABNORMAL LOW (ref >60)
Glucose, Bld: 85 mg/dL (ref 70–99)
Potassium: 3.9 mmol/L (ref 3.5–5.1)
Sodium: 143 mmol/L (ref 135–145)

## 2022-01-06 LAB — CBC
HCT: 40.6 % (ref 39.0–52.0)
Hemoglobin: 13 g/dL (ref 13.0–17.0)
MCH: 30.8 pg (ref 26.0–34.0)
MCHC: 32 g/dL (ref 30.0–36.0)
MCV: 96.2 fL (ref 80.0–100.0)
Platelets: 275 10*3/uL (ref 150–400)
RBC: 4.22 MIL/uL (ref 4.22–5.81)
RDW: 15.2 % (ref 11.5–15.5)
WBC: 6.5 10*3/uL (ref 4.0–10.5)
nRBC: 0 % (ref 0.0–0.2)

## 2022-01-06 LAB — SURGICAL PCR SCREEN
MRSA, PCR: NEGATIVE
Staphylococcus aureus: NEGATIVE

## 2022-01-06 LAB — GLUCOSE, CAPILLARY: Glucose-Capillary: 80 mg/dL (ref 70–99)

## 2022-01-09 NOTE — Progress Notes (Signed)
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  Procedure will likely interfere with device function.  Device should be programmed tachycardia therapies disabled and asynchronous pacing during procedure and return to normal programming after procedure. 2.  Provide continuous ECG monitoring when magnet is used a reprogramming is to be performed.   VS: BP (!) 149/91   Pulse 80   Temp 36.6 C (Oral)   Resp 16   Ht '5\' 8"'$  (1.727 m)   Wt 61.9 kg   SpO2 100%   BMI 20.74 kg/m   PROVIDERS: - PCP is Moshe Cipro, MD who cleared pt for surgery as optimized - Cardiologist is Raechel Chute, MD. Initial office visit 11/22/21   LABS: Labs reviewed: Acceptable for surgery. (all labs ordered are listed, but only abnormal results are displayed)  Labs Reviewed  BASIC METABOLIC PANEL - Abnormal; Notable for the following components:      Result Value   Creatinine, Ser 1.59 (*)    GFR, Estimated 45 (*)    All other components within normal limits  SURGICAL PCR SCREEN  CBC  GLUCOSE, CAPILLARY  TYPE AND SCREEN     IMAGES: CXR 06/25/21 (care everywhere):  1.  Status post placement of left anterior chest wall tract lead AICD. No pneumothorax.  2.  Small bilateral pleural effusions, possibly slightly increased from recent CT.   CT chest 06/23/21 (care everywhere):   1. 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  2. Sized SVC and right subclavian vein.  3. Pacing leads terminate distal RA and also the RV and along the membranous portion of the intraventricular septum near the aortic root.    EKG 06/25/21 (report in care everywhere, tracing on paper chart):  Ventricular-paced rhythm with occasional premature ventricular complexes  Without magnet, ventricular pacing, no pacing abnormalities  Predominance of paced beats precludes contour analysis    CV: TEE 06/24/21 (care everywhere - by anesthesia perioperatively for AICD insertion):  - Pre TEE:    Normal LV size, reduced EF approx 30-35% by visual estimation, akinesis of the basal to mid papillary anteroseptal wall, otherwise globally hypokinetic, mild RV dilation and dysfunction, mod MR with P2 prolapse, trace TR, trace AI/PI, no AS, prior  iatrogenic ASD with flow predominantly in the L to R direction. No pericardial effusion, anterior fat pad noted      - Post TEE:    Unchanged. No pericardial effusion    Cardiac MRI 03/31/21 (care everywhere):  - New moderately reduced LV and mildly reduced RV function.   - New LV inferior and inferolateral subendocardial myocardial infarction.  - Moderate mitral regurgitation. Mild tricuspid regurgitation.  - New pacemaker leads.    CArdiac cath 12/13/15:  Ost RCA to Prox RCA lesion, 40 %stenosed.  Ost 1st Diag to 1st Diag lesion, 60 %stenosed. 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 LV end diastolic pressure is moderately elevated. The left ventricular ejection fraction is less than 25% by visual estimate.    Past Medical History:  Diagnosis Date   A-fib [facility])    Barrett's esophagus 2011   In [place]. Records in Epic   CHF (congestive heart failure) (HCC)    Chronic kidney disease    Coronary atherosclerosis of  native coronary artery    Nonobstructive   Essential hypertension, benign    GERD (gastroesophageal reflux disease)    Heart block AV first degree    Chronic   History of kidney stones    Hypercholesterolemia    Mixed hyperlipidemia    NSVT (nonsustained ventricular tachycardia) ([place])    Holter monitor, 2011; recommended RF ablation by [name] - never required   PAF (paroxysmal atrial fibrillation) [facility])    Recently documented in [facility] 2016   Pre-diabetes    Rheumatoid arthritis ([place])    Sinus bradycardia    Symptomatic, acebutolol discontinued   Skin cancer    Sleep apnea    pt denies    Past Surgical History:  Procedure Laterality Date   CARDIAC CATHETERIZATION N/A 12/13/2015   Procedure: Left Heart Cath and Coronary Angiography;  Surgeon: Lorretta Harp, MD;  Location: [facility];  Service: Cardiovascular;  Laterality: N/A;   CARDIOVERSION N/A 04/11/2016   Procedure: CARDIOVERSION;  Surgeon: Skeet Latch, MD;  Location: [facility] ENDOSCOPY;  Service: Cardiovascular;  Laterality: N/A;   COLONOSCOPY  03/13/2012   [place]--Dr. Spainhour   EP IMPLANTABLE DEVICE N/A 02/01/2016   Procedure: Pacemaker Implant;  Surgeon: Thompson Grayer, MD;  Location: [facility];  Service: Cardiovascular;  Laterality: N/A;   ESOPHAGOGASTRODUODENOSCOPY  03/13/2012   [place], [place]--Dr. [place]     2   KIDNEY STONE SURGERY     LEFT HEART CATHETERIZATION WITH CORONARY ANGIOGRAM N/A 08/20/2012   Procedure: LEFT HEART CATHETERIZATION WITH CORONARY ANGIOGRAM;  Surgeon: [name], MD;  Location: [facility] CATH LAB;  Service: Cardiovascular;  Laterality: N/A;   Left leg surgery  03/13/1977   SKIN CANCER EXCISION      MEDICATIONS:  acetaminophen (TYLENOL) 325 MG tablet   ALPRAZolam (XANAX XR) 0.5 MG 24 hr tablet   carvedilol (COREG) 6.25 MG tablet   Cranberry 400 MG CAPS   cyanocobalamin 2000 MCG tablet   losartan (COZAAR) 50 MG tablet   NITROSTAT 0.4 MG  SL tablet   pantoprazole (PROTONIX) 40 MG tablet   potassium chloride SA (KLOR-CON M) 20 MEQ tablet   Rivaroxaban (XARELTO) 15 MG TABS tablet   rivaroxaban (XARELTO) 20 MG TABS tablet   rosuvastatin (CRESTOR) 5 MG tablet   traMADol (ULTRAM) 50 MG tablet   No current facility-administered medications for this encounter.    If no changes, I anticipate pt can proceed with surgery as scheduled.   Willeen Cass, PhD, FNP-BC [facility]/Anesthesiology Phone: [phone] 01/09/2022 10:43 AM

## 2022-01-09 NOTE — Anesthesia Preprocedure Evaluation (Addendum)
Anesthesia Evaluation  Patient identified by MRN, date of birth, ID band Patient awake    Reviewed: Allergy & Precautions, H&P , NPO status , Patient's Chart, lab work & pertinent test results  Airway Mallampati: II  TM Distance: >3 FB Neck ROM: Full    Dental no notable dental hx. (+) Teeth Intact, Dental Advisory Given   Pulmonary sleep apnea ,    Pulmonary exam normal breath sounds clear to auscultation       Cardiovascular Exercise Tolerance: Good hypertension, Pt. on medications and Pt. on home beta blockers + CAD and +CHF  + dysrhythmias Ventricular Tachycardia + Cardiac Defibrillator  Rhythm:Regular Rate:Normal     Neuro/Psych negative neurological ROS  negative psych ROS   GI/Hepatic Neg liver ROS, GERD  Medicated,  Endo/Other  negative endocrine ROS  Renal/GU Renal InsufficiencyRenal disease  negative genitourinary   Musculoskeletal  (+) Arthritis , Osteoarthritis,    Abdominal   Peds  Hematology negative hematology ROS (+)   Anesthesia Other Findings   Reproductive/Obstetrics negative OB ROS                           Anesthesia Physical Anesthesia Plan  ASA: 3  Anesthesia Plan: Spinal   Post-op Pain Management: Tylenol PO (pre-op)*   Induction: Intravenous  PONV Risk Score and Plan: 2 and Propofol infusion and Ondansetron  Airway Management Planned: Natural Airway and Simple Face Mask  Additional Equipment:   Intra-op Plan:   Post-operative Plan:   Informed Consent: I have reviewed the patients History and Physical, chart, labs and discussed the procedure including the risks, benefits and alternatives for the proposed anesthesia with the patient or authorized representative who has indicated his/her understanding and acceptance.     Dental advisory given  Plan Discussed with: CRNA  Anesthesia Plan Comments: (See APP note by Joslyn Hy, FNP )       Anesthesia Quick Evaluation

## 2022-01-11 ENCOUNTER — Encounter (HOSPITAL_COMMUNITY): Payer: Self-pay | Admitting: Orthopedic Surgery

## 2022-01-11 ENCOUNTER — Other Ambulatory Visit: Payer: Self-pay

## 2022-01-11 ENCOUNTER — Ambulatory Visit (HOSPITAL_COMMUNITY): Payer: Medicare PPO

## 2022-01-11 ENCOUNTER — Ambulatory Visit (HOSPITAL_COMMUNITY): Payer: Medicare PPO | Admitting: Emergency Medicine

## 2022-01-11 ENCOUNTER — Ambulatory Visit (HOSPITAL_BASED_OUTPATIENT_CLINIC_OR_DEPARTMENT_OTHER): Payer: Medicare PPO | Admitting: Anesthesiology

## 2022-01-11 ENCOUNTER — Observation Stay (HOSPITAL_COMMUNITY)
Admission: RE | Admit: 2022-01-11 | Discharge: 2022-01-14 | Disposition: A | Discharge status: Home / Self Care | Payer: Medicare PPO | Attending: Orthopedic Surgery | Admitting: Orthopedic Surgery

## 2022-01-11 ENCOUNTER — Encounter (HOSPITAL_COMMUNITY)
Admission: RE | Disposition: A | Discharge status: Home / Self Care | Payer: Self-pay | Source: Home / Self Care | Attending: Orthopedic Surgery

## 2022-01-11 DIAGNOSIS — R7303 Prediabetes: Secondary | ICD-10-CM

## 2022-01-11 DIAGNOSIS — I480 Paroxysmal atrial fibrillation: Secondary | ICD-10-CM | POA: Insufficient documentation

## 2022-01-11 DIAGNOSIS — M87052 Idiopathic aseptic necrosis of left femur: Secondary | ICD-10-CM | POA: Diagnosis present

## 2022-01-11 DIAGNOSIS — Z85828 Personal history of other malignant neoplasm of skin: Secondary | ICD-10-CM | POA: Diagnosis not present

## 2022-01-11 DIAGNOSIS — I130 Hypertensive heart and chronic kidney disease with heart failure and stage 1 through stage 4 chronic kidney disease, or unspecified chronic kidney disease: Secondary | ICD-10-CM | POA: Insufficient documentation

## 2022-01-11 DIAGNOSIS — I509 Heart failure, unspecified: Secondary | ICD-10-CM

## 2022-01-11 DIAGNOSIS — Z96642 Presence of left artificial hip joint: Secondary | ICD-10-CM

## 2022-01-11 DIAGNOSIS — I2510 Atherosclerotic heart disease of native coronary artery without angina pectoris: Secondary | ICD-10-CM

## 2022-01-11 DIAGNOSIS — M879 Osteonecrosis, unspecified: Secondary | ICD-10-CM | POA: Diagnosis not present

## 2022-01-11 DIAGNOSIS — Z7901 Long term (current) use of anticoagulants: Secondary | ICD-10-CM | POA: Diagnosis not present

## 2022-01-11 DIAGNOSIS — Z79899 Other long term (current) drug therapy: Secondary | ICD-10-CM | POA: Diagnosis not present

## 2022-01-11 DIAGNOSIS — N189 Chronic kidney disease, unspecified: Secondary | ICD-10-CM | POA: Insufficient documentation

## 2022-01-11 DIAGNOSIS — I110 Hypertensive heart disease with heart failure: Secondary | ICD-10-CM

## 2022-01-11 DIAGNOSIS — R262 Difficulty in walking, not elsewhere classified: Secondary | ICD-10-CM | POA: Diagnosis not present

## 2022-01-11 HISTORY — PX: TOTAL HIP ARTHROPLASTY: SHX124

## 2022-01-11 HISTORY — DX: Presence of automatic (implantable) cardiac defibrillator: Z95.810

## 2022-01-11 LAB — ABO/RH: ABO/RH(D): O POS

## 2022-01-11 LAB — TYPE AND SCREEN
ABO/RH(D): O POS
Antibody Screen: NEGATIVE

## 2022-01-11 SURGERY — ARTHROPLASTY, HIP, TOTAL, ANTERIOR APPROACH
Anesthesia: Spinal | Site: Hip | Laterality: Left

## 2022-01-11 MED ORDER — DEXAMETHASONE SODIUM PHOSPHATE 10 MG/ML IJ SOLN
INTRAMUSCULAR | Status: DC | PRN
  Administered 2022-01-11: 8 mg via INTRAVENOUS

## 2022-01-11 MED ORDER — LACTATED RINGERS IV SOLN: INTRAVENOUS | Status: DC

## 2022-01-11 MED ORDER — FENTANYL CITRATE (PF) 100 MCG/2ML IJ SOLN
INTRAMUSCULAR | Status: AC
  Filled 2022-01-11: qty 2

## 2022-01-11 MED ORDER — ISOPROPYL ALCOHOL 70 % SOLN
Status: DC | PRN
  Administered 2022-01-11: 1 via TOPICAL

## 2022-01-11 MED ORDER — ACETAMINOPHEN 325 MG PO TABS: 325.0000 mg | ORAL_TABLET | Freq: Four times a day (QID) | ORAL | Status: DC | PRN

## 2022-01-11 MED ORDER — POVIDONE-IODINE 10 % EX SWAB: 2.0000 | Freq: Once | CUTANEOUS | Status: DC

## 2022-01-11 MED ORDER — PROPOFOL 500 MG/50ML IV EMUL
INTRAVENOUS | Status: DC | PRN
  Administered 2022-01-11: 75 ug/kg/min via INTRAVENOUS

## 2022-01-11 MED ORDER — ALUM & MAG HYDROXIDE-SIMETH 200-200-20 MG/5ML PO SUSP
30.0000 mL | ORAL | Status: DC | PRN
  Filled 2022-01-11: qty 30

## 2022-01-11 MED ORDER — POVIDONE-IODINE 10 % EX SWAB
2.0000 | Freq: Once | CUTANEOUS | Status: AC
  Administered 2022-01-11: 2 via TOPICAL

## 2022-01-11 MED ORDER — SODIUM CHLORIDE (PF) 0.9 % IJ SOLN
INTRAMUSCULAR | Status: AC
  Filled 2022-01-11: qty 30

## 2022-01-11 MED ORDER — PROPOFOL 1000 MG/100ML IV EMUL
INTRAVENOUS | Status: AC
  Filled 2022-01-11: qty 100

## 2022-01-11 MED ORDER — CRANBERRY 400 MG PO CAPS: 400.0000 mg | ORAL_CAPSULE | Freq: Every day | ORAL | Status: DC

## 2022-01-11 MED ORDER — MENTHOL 3 MG MT LOZG: 1.0000 | LOZENGE | OROMUCOSAL | Status: DC | PRN

## 2022-01-11 MED ORDER — ONDANSETRON HCL 4 MG/2ML IJ SOLN
4.0000 mg | Freq: Four times a day (QID) | INTRAMUSCULAR | Status: DC | PRN
  Administered 2022-01-12: 4 mg via INTRAVENOUS
  Filled 2022-01-11: qty 2

## 2022-01-11 MED ORDER — HYDROCODONE-ACETAMINOPHEN 7.5-325 MG PO TABS
1.0000 | ORAL_TABLET | ORAL | Status: DC | PRN
  Administered 2022-01-11 – 2022-01-14 (×7): 2 via ORAL
  Filled 2022-01-11 (×7): qty 2

## 2022-01-11 MED ORDER — WATER FOR IRRIGATION, STERILE IR SOLN
Status: DC | PRN
  Administered 2022-01-11: 2000 mL

## 2022-01-11 MED ORDER — METHOCARBAMOL 500 MG PO TABS
500.0000 mg | ORAL_TABLET | Freq: Four times a day (QID) | ORAL | Status: DC | PRN
  Administered 2022-01-11 – 2022-01-12 (×3): 500 mg via ORAL
  Filled 2022-01-11 (×3): qty 1

## 2022-01-11 MED ORDER — CEFAZOLIN SODIUM-DEXTROSE 2-4 GM/100ML-% IV SOLN
2.0000 g | INTRAVENOUS | Status: AC
  Administered 2022-01-11: 2 g via INTRAVENOUS
  Filled 2022-01-11: qty 100

## 2022-01-11 MED ORDER — METOCLOPRAMIDE HCL 5 MG/ML IJ SOLN
5.0000 mg | Freq: Three times a day (TID) | INTRAMUSCULAR | Status: DC | PRN
  Administered 2022-01-13: 10 mg via INTRAVENOUS
  Filled 2022-01-11: qty 2

## 2022-01-11 MED ORDER — DIPHENHYDRAMINE HCL 12.5 MG/5ML PO ELIX
12.5000 mg | ORAL_SOLUTION | ORAL | Status: DC | PRN
  Administered 2022-01-13: 25 mg via ORAL
  Filled 2022-01-11: qty 10

## 2022-01-11 MED ORDER — DEXAMETHASONE SODIUM PHOSPHATE 10 MG/ML IJ SOLN
INTRAMUSCULAR | Status: AC
  Filled 2022-01-11: qty 2

## 2022-01-11 MED ORDER — METOCLOPRAMIDE HCL 5 MG PO TABS: 5.0000 mg | ORAL_TABLET | Freq: Three times a day (TID) | ORAL | Status: DC | PRN

## 2022-01-11 MED ORDER — BUPIVACAINE IN DEXTROSE 0.75-8.25 % IT SOLN
INTRATHECAL | Status: DC | PRN
  Administered 2022-01-11: 1.6 mL via INTRATHECAL

## 2022-01-11 MED ORDER — RIVAROXABAN 10 MG PO TABS
10.0000 mg | ORAL_TABLET | Freq: Every day | ORAL | Status: DC
  Administered 2022-01-12 – 2022-01-14 (×3): 10 mg via ORAL
  Filled 2022-01-11 (×3): qty 1

## 2022-01-11 MED ORDER — PHENYLEPHRINE HCL-NACL 20-0.9 MG/250ML-% IV SOLN
INTRAVENOUS | Status: DC | PRN
  Administered 2022-01-11: 25 ug/min via INTRAVENOUS

## 2022-01-11 MED ORDER — ACETAMINOPHEN 500 MG PO TABS
1000.0000 mg | ORAL_TABLET | Freq: Once | ORAL | Status: AC
  Administered 2022-01-11: 1000 mg via ORAL

## 2022-01-11 MED ORDER — HYDROCODONE-ACETAMINOPHEN 5-325 MG PO TABS
1.0000 | ORAL_TABLET | ORAL | Status: DC | PRN
  Administered 2022-01-11 – 2022-01-12 (×3): 2 via ORAL
  Filled 2022-01-11 (×3): qty 2

## 2022-01-11 MED ORDER — PANTOPRAZOLE SODIUM 40 MG PO TBEC
40.0000 mg | DELAYED_RELEASE_TABLET | Freq: Every day | ORAL | Status: DC
  Administered 2022-01-11 – 2022-01-14 (×4): 40 mg via ORAL
  Filled 2022-01-11 (×4): qty 1

## 2022-01-11 MED ORDER — POTASSIUM CHLORIDE CRYS ER 20 MEQ PO TBCR
20.0000 meq | EXTENDED_RELEASE_TABLET | Freq: Every day | ORAL | Status: DC
  Administered 2022-01-11 – 2022-01-14 (×4): 20 meq via ORAL
  Filled 2022-01-11 (×4): qty 1

## 2022-01-11 MED ORDER — LIDOCAINE HCL (PF) 2 % IJ SOLN
INTRAMUSCULAR | Status: AC
  Filled 2022-01-11: qty 10

## 2022-01-11 MED ORDER — ONDANSETRON HCL 4 MG/2ML IJ SOLN
INTRAMUSCULAR | Status: AC
  Filled 2022-01-11: qty 4

## 2022-01-11 MED ORDER — BUPIVACAINE-EPINEPHRINE (PF) 0.5% -1:200000 IJ SOLN
INTRAMUSCULAR | Status: AC
  Filled 2022-01-11: qty 30

## 2022-01-11 MED ORDER — CEFAZOLIN SODIUM-DEXTROSE 2-4 GM/100ML-% IV SOLN
2.0000 g | Freq: Four times a day (QID) | INTRAVENOUS | Status: AC
  Administered 2022-01-11 (×2): 2 g via INTRAVENOUS
  Filled 2022-01-11 (×2): qty 100

## 2022-01-11 MED ORDER — FENTANYL CITRATE (PF) 100 MCG/2ML IJ SOLN
INTRAMUSCULAR | Status: DC | PRN
  Administered 2022-01-11: 50 ug via INTRAVENOUS

## 2022-01-11 MED ORDER — ONDANSETRON HCL 4 MG PO TABS: 4.0000 mg | ORAL_TABLET | Freq: Four times a day (QID) | ORAL | Status: DC | PRN

## 2022-01-11 MED ORDER — PROPOFOL 10 MG/ML IV BOLUS
INTRAVENOUS | Status: AC
  Filled 2022-01-11: qty 20

## 2022-01-11 MED ORDER — ORAL CARE MOUTH RINSE: 15.0000 mL | Freq: Once | OROMUCOSAL | Status: AC

## 2022-01-11 MED ORDER — CHLORHEXIDINE GLUCONATE 0.12 % MT SOLN
15.0000 mL | Freq: Once | OROMUCOSAL | Status: AC
  Administered 2022-01-11: 15 mL via OROMUCOSAL

## 2022-01-11 MED ORDER — POLYETHYLENE GLYCOL 3350 17 G PO PACK: 17.0000 g | PACK | Freq: Every day | ORAL | Status: DC | PRN

## 2022-01-11 MED ORDER — ACETAMINOPHEN 500 MG PO TABS
1000.0000 mg | ORAL_TABLET | Freq: Once | ORAL | Status: DC
  Filled 2022-01-11: qty 2

## 2022-01-11 MED ORDER — DOCUSATE SODIUM 100 MG PO CAPS
100.0000 mg | ORAL_CAPSULE | Freq: Two times a day (BID) | ORAL | Status: DC
  Administered 2022-01-11 – 2022-01-14 (×6): 100 mg via ORAL
  Filled 2022-01-11 (×6): qty 1

## 2022-01-11 MED ORDER — ONDANSETRON HCL 4 MG/2ML IJ SOLN
INTRAMUSCULAR | Status: DC | PRN
  Administered 2022-01-11: 4 mg via INTRAVENOUS

## 2022-01-11 MED ORDER — KETOROLAC TROMETHAMINE 30 MG/ML IJ SOLN
INTRAMUSCULAR | Status: DC | PRN
  Administered 2022-01-11: 30 mg via INTRAVENOUS

## 2022-01-11 MED ORDER — PHENOL 1.4 % MT LIQD: 1.0000 | OROMUCOSAL | Status: DC | PRN

## 2022-01-11 MED ORDER — CARVEDILOL 12.5 MG PO TABS
6.2500 mg | ORAL_TABLET | Freq: Once | ORAL | Status: AC
  Administered 2022-01-11: 6.25 mg via ORAL
  Filled 2022-01-11: qty 1

## 2022-01-11 MED ORDER — CARVEDILOL 6.25 MG PO TABS
6.2500 mg | ORAL_TABLET | Freq: Two times a day (BID) | ORAL | Status: DC
  Administered 2022-01-11 – 2022-01-14 (×6): 6.25 mg via ORAL
  Filled 2022-01-11 (×6): qty 1

## 2022-01-11 MED ORDER — METHOCARBAMOL 1000 MG/10ML IJ SOLN: 500.0000 mg | Freq: Four times a day (QID) | INTRAVENOUS | Status: DC | PRN

## 2022-01-11 MED ORDER — BUPIVACAINE-EPINEPHRINE 0.5% -1:200000 IJ SOLN
INTRAMUSCULAR | Status: DC | PRN
  Administered 2022-01-11: 30 mL

## 2022-01-11 MED ORDER — SENNA 8.6 MG PO TABS
1.0000 | ORAL_TABLET | Freq: Two times a day (BID) | ORAL | Status: DC
  Administered 2022-01-11 – 2022-01-14 (×6): 8.6 mg via ORAL
  Filled 2022-01-11 (×6): qty 1

## 2022-01-11 MED ORDER — SODIUM CHLORIDE (PF) 0.9 % IJ SOLN
INTRAMUSCULAR | Status: DC | PRN
  Administered 2022-01-11: 30 mL

## 2022-01-11 MED ORDER — MORPHINE SULFATE (PF) 2 MG/ML IV SOLN
0.5000 mg | INTRAVENOUS | Status: DC | PRN
  Administered 2022-01-11 – 2022-01-13 (×2): 1 mg via INTRAVENOUS
  Filled 2022-01-11 (×2): qty 1

## 2022-01-11 MED ORDER — MIDAZOLAM HCL 5 MG/5ML IJ SOLN
INTRAMUSCULAR | Status: DC | PRN
  Administered 2022-01-11: 2 mg via INTRAVENOUS

## 2022-01-11 MED ORDER — VITAMIN B-12 1000 MCG PO TABS
2000.0000 ug | ORAL_TABLET | Freq: Every day | ORAL | Status: DC
  Administered 2022-01-11 – 2022-01-14 (×4): 2000 ug via ORAL
  Filled 2022-01-11 (×4): qty 2

## 2022-01-11 MED ORDER — NITROGLYCERIN 0.4 MG SL SUBL: 0.4000 mg | SUBLINGUAL_TABLET | SUBLINGUAL | Status: DC | PRN

## 2022-01-11 MED ORDER — MIDAZOLAM HCL 2 MG/2ML IJ SOLN
INTRAMUSCULAR | Status: AC
  Filled 2022-01-11: qty 2

## 2022-01-11 MED ORDER — TRANEXAMIC ACID-NACL 1000-0.7 MG/100ML-% IV SOLN
1000.0000 mg | INTRAVENOUS | Status: AC
  Administered 2022-01-11: 1000 mg via INTRAVENOUS
  Filled 2022-01-11: qty 100

## 2022-01-11 MED ORDER — KETOROLAC TROMETHAMINE 30 MG/ML IJ SOLN
INTRAMUSCULAR | Status: AC
  Filled 2022-01-11: qty 1

## 2022-01-11 MED ORDER — SODIUM CHLORIDE 0.9 % IV SOLN: INTRAVENOUS | Status: DC

## 2022-01-11 MED ORDER — SODIUM CHLORIDE 0.9 % IR SOLN
Status: DC | PRN
  Administered 2022-01-11 (×3): 1000 mL

## 2022-01-11 SURGICAL SUPPLY — 56 items
ACE SHELL 54 4H HIP (Shell) ×1 IMPLANT
BAG COUNTER SPONGE SURGICOUNT (BAG) IMPLANT
BAG DECANTER FOR FLEXI CONT (MISCELLANEOUS) IMPLANT
BAG ZIPLOCK 12X15 (MISCELLANEOUS) IMPLANT
CATH FOLEY 2WAY  3CC 10FR (CATHETERS) ×1
CATH FOLEY 2WAY 3CC 10FR (CATHETERS) IMPLANT
CHLORAPREP W/TINT 26 (MISCELLANEOUS) ×1 IMPLANT
COVER PERINEAL POST (MISCELLANEOUS) ×1 IMPLANT
COVER SURGICAL LIGHT HANDLE (MISCELLANEOUS) ×1 IMPLANT
DERMABOND ADVANCED .7 DNX12 (GAUZE/BANDAGES/DRESSINGS) ×2 IMPLANT
DRAPE IMP U-DRAPE 54X76 (DRAPES) ×1 IMPLANT
DRAPE SHEET LG 3/4 BI-LAMINATE (DRAPES) ×3 IMPLANT
DRAPE STERI IOBAN 125X83 (DRAPES) ×1 IMPLANT
DRAPE U-SHAPE 47X51 STRL (DRAPES) ×2 IMPLANT
DRSG AQUACEL AG ADV 3.5X 6 (GAUZE/BANDAGES/DRESSINGS) IMPLANT
DRSG AQUACEL AG ADV 3.5X10 (GAUZE/BANDAGES/DRESSINGS) ×1 IMPLANT
ELECT REM PT RETURN 15FT ADLT (MISCELLANEOUS) ×1 IMPLANT
GAUZE SPONGE 4X4 12PLY STRL (GAUZE/BANDAGES/DRESSINGS) ×1 IMPLANT
GLOVE BIO SURGEON STRL SZ8.5 (GLOVE) ×2 IMPLANT
GLOVE BIOGEL M 7.0 STRL (GLOVE) ×1 IMPLANT
GLOVE BIOGEL PI IND STRL 7.5 (GLOVE) ×1 IMPLANT
GLOVE BIOGEL PI IND STRL 8.5 (GLOVE) ×1 IMPLANT
GOWN SPEC L3 XXLG W/TWL (GOWN DISPOSABLE) ×1 IMPLANT
GOWN STRL REUS W/ TWL XL LVL3 (GOWN DISPOSABLE) ×1 IMPLANT
GOWN STRL REUS W/TWL XL LVL3 (GOWN DISPOSABLE) ×1
HANDPIECE INTERPULSE COAX TIP (DISPOSABLE) ×1
HEAD CERAMIC BIOLOX 36 T1 STD (Head) IMPLANT
HOLDER FOLEY CATH W/STRAP (MISCELLANEOUS) ×1 IMPLANT
HOOD PEEL AWAY FLYTE STAYCOOL (MISCELLANEOUS) ×3 IMPLANT
KIT TURNOVER KIT A (KITS) IMPLANT
LINER ACETAB NN G7 F 36 (Liner) IMPLANT
MANIFOLD NEPTUNE II (INSTRUMENTS) ×1 IMPLANT
MARKER SKIN DUAL TIP RULER LAB (MISCELLANEOUS) ×1 IMPLANT
NDL SAFETY ECLIP 18X1.5 (MISCELLANEOUS) ×1 IMPLANT
NDL SPNL 18GX3.5 QUINCKE PK (NEEDLE) ×1 IMPLANT
NEEDLE SPNL 18GX3.5 QUINCKE PK (NEEDLE) ×1 IMPLANT
PACK ANTERIOR HIP CUSTOM (KITS) ×1 IMPLANT
PENCIL SMOKE EVACUATOR (MISCELLANEOUS) IMPLANT
SAW OSC TIP CART 19.5X105X1.3 (SAW) ×1 IMPLANT
SEALER BIPOLAR AQUA 6.0 (INSTRUMENTS) ×1 IMPLANT
SET HNDPC FAN SPRY TIP SCT (DISPOSABLE) ×1 IMPLANT
SHELL ACETAB 54 4H HIP (Shell) IMPLANT
SOLUTION PRONTOSAN WOUND 350ML (IRRIGATION / IRRIGATOR) ×1 IMPLANT
SPIKE FLUID TRANSFER (MISCELLANEOUS) ×1 IMPLANT
STEM FEM CMTLS 15X115 133D (Stem) IMPLANT
SUT MNCRL AB 3-0 PS2 18 (SUTURE) ×1 IMPLANT
SUT MON AB 2-0 CT1 36 (SUTURE) ×1 IMPLANT
SUT STRATAFIX PDO 1 14 VIOLET (SUTURE) ×1
SUT STRATFX PDO 1 14 VIOLET (SUTURE) ×1
SUT VIC AB 2-0 CT1 27 (SUTURE) ×1
SUT VIC AB 2-0 CT1 TAPERPNT 27 (SUTURE) IMPLANT
SUTURE STRATFX PDO 1 14 VIOLET (SUTURE) ×1 IMPLANT
SYR 3ML LL SCALE MARK (SYRINGE) ×1 IMPLANT
TRAY FOLEY MTR SLVR 16FR STAT (SET/KITS/TRAYS/PACK) IMPLANT
TUBE SUCTION HIGH CAP CLEAR NV (SUCTIONS) ×1 IMPLANT
WATER STERILE IRR 1000ML POUR (IV SOLUTION) ×1 IMPLANT

## 2022-01-11 NOTE — Evaluation (Signed)
Physical Therapy Evaluation Patient Details Name: Brent Ray. MRN: [id number] DOB: [birth year] Today's Date: 01/11/2022  History of Present Illness  Pt is a 76yo male presenting s/p L-THAAA on 01/11/22. PMH: afib, AICD, CHF, CKd, HTN, GERD, first degree heart block, HLD, rheumatoid arthritis, CAD.   Clinical Impression  Brent Calvin. is a 76 y.o. male POD 0 s/p L-THAAA . Patient reports independence with mobility at baseline. Patient is now limited by functional impairments (see PT problem list below) and requires supervision for bed mobility and min guard for transfers. Patient was able to ambulate 70 feet with RW and min guard level of assist. Patient instructed in exercise to facilitate ROM and circulation to manage edema. Provided incentive spirometer and with Vcs pt able to achieve 1083m. Patient will benefit from continued skilled PT interventions to address impairments and progress towards PLOF. Acute PT will follow to progress mobility and stair training in preparation for safe discharge home.       Recommendations for follow up therapy are one component of a multi-disciplinary discharge planning process, led by the attending physician.  Recommendations may be updated based on patient status, additional functional criteria and insurance authorization.  Follow Up Recommendations Follow physician's recommendations for discharge plan and follow up therapies      Assistance Recommended at Discharge Frequent or constant Supervision/Assistance  Patient can return home with the following  A little help with walking and/or transfers;A little help with bathing/dressing/bathroom;Assistance with cooking/housework;Assist for transportation;Help with stairs or ramp for entrance    Equipment Recommendations Rolling walker (2 wheels);BSC/3in1  Recommendations for Other Services       Functional Status Assessment Patient has had a recent decline in their functional status and  demonstrates the ability to make significant improvements in function in a reasonable and predictable amount of time.     Precautions / Restrictions Precautions Precautions: Fall Restrictions Weight Bearing Restrictions: No Other Position/Activity Restrictions: wbat      Mobility  Bed Mobility Overal bed mobility: Needs Assistance Bed Mobility: Supine to Sit     Supine to sit: Supervision, HOB elevated     General bed mobility comments: For safety only    Transfers Overall transfer level: Needs assistance Equipment used: Rolling walker (2 wheels) Transfers: Sit to/from Stand Sit to Stand: Min guard           General transfer comment: Min guard for safety, no physical assist required    Ambulation/Gait Ambulation/Gait assistance: Min guard, +2 safety/equipment Gait Distance (Feet): 70 Feet Assistive device: Rolling walker (2 wheels) Gait Pattern/deviations: Step-to pattern Gait velocity: decreased     General Gait Details: Pt ambulated with RW and min guard +2 for recliner follow for safety no physical assist required or overt LOB noted.  Stairs            Wheelchair Mobility    Modified Rankin (Stroke Patients Only)       Balance Overall balance assessment: Needs assistance Sitting-balance support: Feet supported, No upper extremity supported Sitting balance-Leahy Scale: Good     Standing balance support: Reliant on assistive device for balance, During functional activity, Bilateral upper extremity supported Standing balance-Leahy Scale: Poor                               Pertinent Vitals/Pain Pain Assessment Pain Assessment: No/denies pain    Home Living Family/patient expects to be discharged to:: Private residence Living Arrangements: Alone  Available Help at Discharge: Family;Available 24 hours/day Type of Home: House Home Access: Stairs to enter Entrance Stairs-Rails:  (from garage into the house) Technical brewer  of Steps: 1   Home Layout: Able to live on main level with bedroom/bathroom Home Equipment: Cane - single point      Prior Function Prior Level of Function : Independent/Modified Independent;Driving             Mobility Comments: IND ADLs Comments: IND     Hand Dominance        Extremity/Trunk Assessment   Upper Extremity Assessment Upper Extremity Assessment: Overall WFL for tasks assessed    Lower Extremity Assessment Lower Extremity Assessment: RLE deficits/detail;LLE deficits/detail RLE Deficits / Details: MMT ank DF/PF 5/5 RLE Sensation: WNL LLE Deficits / Details: MMT ank DF/PF 5/5 LLE Sensation: WNL    Cervical / Trunk Assessment Cervical / Trunk Assessment: Normal  Communication   Communication: No difficulties  Cognition Arousal/Alertness: Awake/alert Behavior During Therapy: WFL for tasks assessed/performed, Impulsive Overall Cognitive Status: Within Functional Limits for tasks assessed                                 General Comments: Impulsive and moving prior to being asked        General Comments General comments (skin integrity, edema, etc.): Sisters Rod Holler and Doris present    Exercises Total Joint Exercises Ankle Circles/Pumps: AROM, Both, 20 reps   Assessment/Plan    PT Assessment Patient needs continued PT services  PT Problem List Decreased strength;Decreased range of motion;Decreased activity tolerance;Decreased balance;Decreased mobility;Decreased coordination;Pain       PT Treatment Interventions DME instruction;Gait training;Stair training;Functional mobility training;Therapeutic activities;Therapeutic exercise;Balance training;Neuromuscular re-education;Patient/family education    PT Goals (Current goals can be found in the Care Plan section)  Acute Rehab PT Goals Patient Stated Goal: Get back in the tractor PT Goal Formulation: With patient Time For Goal Achievement: 01/18/22 Potential to Achieve Goals:  Good    Frequency 7X/week     Co-evaluation               AM-PAC PT "6 Clicks" Mobility  Outcome Measure Help needed turning from your back to your side while in a flat bed without using bedrails?: None Help needed moving from lying on your back to sitting on the side of a flat bed without using bedrails?: A Little Help needed moving to and from a bed to a chair (including a wheelchair)?: A Little Help needed standing up from a chair using your arms (e.g., wheelchair or bedside chair)?: A Little Help needed to walk in hospital room?: A Little Help needed climbing 3-5 steps with a railing? : A Little 6 Click Score: 19    End of Session Equipment Utilized During Treatment: Gait belt Activity Tolerance: Patient tolerated treatment well;No increased pain Patient left: in chair;with call bell/phone within reach;with chair alarm set;with family/visitor present;with SCD's reapplied Nurse Communication: Mobility status PT Visit Diagnosis: Pain;Difficulty in walking, not elsewhere classified (R26.2) Pain - Right/Left: Left Pain - part of body: Hip    Time: 2706-2376 PT Time Calculation (min) (ACUTE ONLY): 20 min   Charges:   PT Evaluation $PT Eval Low Complexity: [place], PT, DPT [facility] Office: [phone] Weekend pager: [phone]  Coolidge Breeze 01/11/2022, 4:34 PM

## 2022-01-11 NOTE — Progress Notes (Signed)
[employer] rep paged. ?

## 2022-01-11 NOTE — Discharge Instructions (Signed)
? ?Dr. Brian Swinteck ?Joint Replacement Specialist ?[facility] ?[address] ?, [place] [address] ?[phone] ? ? ?TOTAL HIP REPLACEMENT POSTOPERATIVE DIRECTIONS ? ? ? ?Hip Rehabilitation, Guidelines Following Surgery  ? ?WEIGHT BEARING ?Weight bearing as tolerated with assist device (walker, cane, etc) as directed, use it as long as suggested by your surgeon or therapist, typically at least 4-6 weeks. ? ?The results of a hip operation are greatly improved after range of motion and muscle strengthening exercises. Follow all safety measures which are given to protect your hip. If any of these exercises cause increased pain or swelling in your joint, decrease the amount until you are comfortable again. Then slowly increase the exercises. Call your caregiver if you have problems or questions.  ? ?HOME CARE INSTRUCTIONS  ?Most of the following instructions are designed to prevent the dislocation of your new hip.  ?Remove items at home which could result in a fall. This includes throw rugs or furniture in walking pathways.  ?Continue medications as instructed at time of discharge. ?You may have some home medications which will be placed on hold until you complete the course of blood thinner medication. ?You may start showering once you are discharged home. Do not remove your dressing. ?Do not put on socks or shoes without following the instructions of your caregivers.   ?Sit on chairs with arms. Use the chair arms to help push yourself up when arising.  ?Arrange for the use of a toilet seat elevator so you are not sitting low.  ?Walk with walker as instructed.  ?You may resume a sexual relationship in one month or when given the OK by your caregiver.  ?Use walker as long as suggested by your caregivers.  ?You may put full weight on your legs and walk as much as is comfortable. ?Avoid periods of inactivity such as sitting longer than an hour when not asleep. This helps prevent blood  clots.  ?You may return to work once you are cleared by your surgeon.  ?Do not drive a car for 6 weeks or until released by your surgeon.  ?Do not drive while taking narcotics.  ?Wear elastic stockings for two weeks following surgery during the day but you may remove then at night.  ?Make sure you keep all of your appointments after your operation with all of your doctors and caregivers. You should call the office at the above phone number and make an appointment for approximately two weeks after the date of your surgery. ?Please pick up a stool softener and laxative for home use as long as you are requiring pain medications. ?ICE to the affected hip every three hours for 30 minutes at a time and then as needed for pain and swelling. Continue to use ice on the hip for pain and swelling from surgery. You may notice swelling that will progress down to the foot and ankle.  This is normal after surgery.  Elevate the leg when you are not up walking on it.   ?It is important for you to complete the blood thinner medication as prescribed by your doctor. ?Continue to use the breathing machine which will help keep your temperature down.  It is common for your temperature to cycle up and down following surgery, especially at night when you are not up moving around and exerting yourself.  The breathing machine keeps your lungs expanded and your temperature down. ? ?RANGE OF MOTION AND STRENGTHENING EXERCISES  ?These exercises are designed to help you   keep full movement of your hip joint. Follow your caregiver's or physical therapist's instructions. Perform all exercises about fifteen times, three times per day or as directed. Exercise both hips, even if you have had only one joint replacement. These exercises can be done on a training (exercise) mat, on the floor, on a table or on a bed. Use whatever works the best and is most comfortable for you. Use music or television while you are exercising so that the exercises are a  pleasant break in your day. This will make your life better with the exercises acting as a break in routine you can look forward to.  ?Lying on your back, slowly slide your foot toward your buttocks, raising your knee up off the floor. Then slowly slide your foot back down until your leg is straight again.  ?Lying on your back spread your legs as far apart as you can without causing discomfort.  ?Lying on your side, raise your upper leg and foot straight up from the floor as far as is comfortable. Slowly lower the leg and repeat.  ?Lying on your back, tighten up the muscle in the front of your thigh (quadriceps muscles). You can do this by keeping your leg straight and trying to raise your heel off the floor. This helps strengthen the largest muscle supporting your knee.  ?Lying on your back, tighten up the muscles of your buttocks both with the legs straight and with the knee bent at a comfortable angle while keeping your heel on the floor.  ? ?SKILLED REHAB INSTRUCTIONS: ?If the patient is transferred to a skilled rehab facility following release from the hospital, a list of the current medications will be sent to the facility for the patient to continue.  When discharged from the skilled rehab facility, please have the facility set up the patient's Home Health Physical Therapy prior to being released. Also, the skilled facility will be responsible for providing the patient with their medications at time of release from the facility to include their pain medication and their blood thinner medication. If the patient is still at the rehab facility at time of the two week follow up appointment, the skilled rehab facility will also need to assist the patient in arranging follow up appointment in our office and any transportation needs. ? ?POST-OPERATIVE OPIOID TAPER INSTRUCTIONS: ?It is important to wean off of your opioid medication as soon as possible. If you do not need pain medication after your surgery it is ok  to stop day one. ?Opioids include: ?Codeine, Hydrocodone(Norco, Vicodin), Oxycodone(Percocet, oxycontin) and hydromorphone amongst others.  ?Long term and even short term use of opiods can cause: ?Increased pain response ?Dependence ?Constipation ?Depression ?Respiratory depression ?And more.  ?Withdrawal symptoms can include ?Flu like symptoms ?Nausea, vomiting ?And more ?Techniques to manage these symptoms ?Hydrate well ?Eat regular healthy meals ?Stay active ?Use relaxation techniques(deep breathing, meditating, yoga) ?Do Not substitute Alcohol to help with tapering ?If you have been on opioids for less than two weeks and do not have pain than it is ok to stop all together.  ?Plan to wean off of opioids ?This plan should start within one week post op of your joint replacement. ?Maintain the same interval or time between taking each dose and first decrease the dose.  ?Cut the total daily intake of opioids by one tablet each day ?Next start to increase the time between doses. ?The last dose that should be eliminated is the evening dose.  ? ? ?MAKE   SURE YOU:  ?Understand these instructions.  ?Will watch your condition.  ?Will get help right away if you are not doing well or get worse. ? ?Pick up stool softner and laxative for home use following surgery while on pain medications. ?Do not remove your dressing. ?The dressing is waterproof--it is OK to take showers. ?Continue to use ice for pain and swelling after surgery. ?Do not use any lotions or creams on the incision until instructed by your surgeon. ?Total Hip Protocol. ? ?

## 2022-01-11 NOTE — Anesthesia Postprocedure Evaluation (Deleted)
Anesthesia Post Note  Patient: [name].  Procedure(s) Performed: TOTAL HIP ARTHROPLASTY ANTERIOR APPROACH (Left: Hip)     Patient location during evaluation: PACU Anesthesia Type: Spinal and Regional Level of consciousness: oriented and awake and alert Pain management: pain level controlled Vital Signs Assessment: post-procedure vital signs reviewed and stable Respiratory status: spontaneous breathing and respiratory function stable Cardiovascular status: blood pressure returned to baseline and stable Postop Assessment: no headache, no backache, no apparent nausea or vomiting, spinal receding and patient able to bend at knees Anesthetic complications: no   No notable events documented.  Last Vitals:  Vitals:   01/11/22 1345 01/11/22 1400  BP: 137/80 133/75  Pulse: 69 69  Resp: 19 14  Temp: 36.4 C   SpO2: 98% 99%    Last Pain:  Vitals:   01/11/22 1345  TempSrc:   PainSc: 0-No pain                 Cassaundra Rasch,W. EDMOND

## 2022-01-11 NOTE — Op Note (Signed)
OPERATIVE REPORT  SURGEON: Rod Can, MD   ASSISTANT: Larene Pickett, PA-C.  PREOPERATIVE DIAGNOSIS: Left hip avascular necrosis.   POSTOPERATIVE DIAGNOSIS: Left hip avascular necrosis.   PROCEDURE: Left total hip arthroplasty, anterior approach.   IMPLANTS: Biomet Taperloc Complete Microplasty stem, size 15 x 153m, high offset. Biomet G7 OsseoTi Cup, size 54 mm. Biomet Vivacit-E liner, size 36 mm, F, neutral. Biomet Biolox ceramic head ball, size 36 + 0 mm.  ANESTHESIA:  MAC and Spinal  ESTIMATED BLOOD LOSS:-150 mL    ANTIBIOTICS: 2g Ancef.  DRAINS: None.  COMPLICATIONS: None.   CONDITION: PACU - hemodynamically stable.   BRIEF CLINICAL NOTE: Brent Ray is a 76y.o. male with a long-standing history of Left hip avascular necrosis. After failing conservative management, the patient was indicated for total hip arthroplasty. The risks, benefits, and alternatives to the procedure were explained, and the patient elected to proceed.  PROCEDURE IN DETAIL: Surgical site was marked by myself in the pre-op holding area. Once inside the operating room, spinal anesthesia was obtained, and a foley catheter was inserted. The patient was then positioned on the Hana table.  All bony prominences were well padded.  The hip was prepped and draped in the normal sterile surgical fashion.  A time-out was called verifying side and site of surgery. The patient received IV antibiotics within 60 minutes of beginning the procedure.   Bikini incision was made, and superficial dissection was performed lateral to the ASIS. The direct anterior approach to the hip was performed through the Hueter interval.  Lateral femoral circumflex vessels were treated with the Auqumantys. The anterior capsule was exposed and an inverted T capsulotomy was made. The femoral neck cut was made to the level of the templated cut.  A corkscrew was placed into the head and the head was removed.  The head was passed  to the back table and was measured. Pubofemoral ligament was released off of the calcar, taking care to stay on bone. Superior capsule was released from the greater trochanter, taking care to stay lateral to the posterior border of the femoral neck in order to preserve the short external rotators.   Acetabular exposure was achieved, and the pulvinar and labrum were excised. Sequential reaming of the acetabulum was then performed up to a size 53 mm reamer. A 54 mm cup was then opened and impacted into place at approximately 40 degrees of abduction and 20 degrees of anteversion. The final polyethylene liner was impacted into place and acetabular osteophytes were removed.    I then gained femoral exposure taking care to protect the abductors and greater trochanter.  This was performed using standard external rotation, extension, and adduction.  A cookie cutter was used to enter the femoral canal, and then the femoral canal finder was placed.  Sequential broaching was performed up to a size 15.  Calcar planer was used on the femoral neck remnant.  I placed a high offset neck and a trial head ball.  The hip was reduced.  Leg lengths and offset were checked fluoroscopically.  The hip was dislocated and trial components were removed.  The final implants were placed, and the hip was reduced.  Fluoroscopy was used to confirm component position and leg lengths.  At 90 degrees of external rotation and full extension, the hip was stable to an anterior directed force.   The wound was copiously irrigated with Prontosan solution and normal saline using pule lavage.  Marcaine solution was injected into the  periarticular soft tissue.  The wound was closed in layers using #1 Vicryl and V-Loc for the fascia, 2-0 Vicryl for the subcutaneous fat, 2-0 Monocryl for the deep dermal layer, and staples + Dermabond for the skin.  Once the glue was fully dried, an Aquacell Ag dressing was applied.  The patient was transported to the  recovery room in stable condition.  Sponge, needle, and instrument counts were correct at the end of the case x2.  The patient tolerated the procedure well and there were no known complications.  Please note that a surgical assistant was a medical necessity for this procedure to perform it in a safe and expeditious manner. Assistant was necessary to provide appropriate retraction of vital neurovascular structures, to prevent femoral fracture, and to allow for anatomic placement of the prosthesis.

## 2022-01-11 NOTE — Anesthesia Procedure Notes (Signed)
Spinal  Patient location during procedure: OR Start time: 01/11/2022 8:30 AM End time: 01/11/2022 8:35 AM Reason for block: surgical anesthesia Staffing Performed: anesthesiologist  Anesthesiologist: Roderic Palau, MD Performed by: Roderic Palau, MD Authorized by: Roderic Palau, MD   Preanesthetic Checklist Completed: patient identified, IV checked, risks and benefits discussed, surgical consent, monitors and equipment checked, pre-op evaluation and timeout performed Spinal Block Patient position: sitting Prep: DuraPrep Patient monitoring: cardiac monitor, continuous pulse ox and blood pressure Approach: midline Location: L3-4 Injection technique: single-shot Needle Needle type: Pencan  Needle gauge: 24 G Needle length: 9 cm Assessment Sensory level: T8 Events: paresthesia, Needle repositioned. No parasthesia with aspiration or injection. and CSF return Additional Notes Functioning IV was confirmed and monitors were applied. Sterile prep and drape, including hand hygiene and sterile gloves were used. The patient was positioned and the spine was prepped. The skin was anesthetized with lidocaine.  Free flow of clear CSF was obtained prior to injecting local anesthetic into the CSF.  The spinal needle aspirated freely following injection.  The needle was carefully withdrawn.  The patient tolerated the procedure well.

## 2022-01-11 NOTE — Interval H&P Note (Signed)
History and Physical Interval Note:  01/11/2022 8:16 AM  Brent Ray.  has presented today for surgery, with the diagnosis of Left hip avascular necrosis.  The various methods of treatment have been discussed with the patient and family. After consideration of risks, benefits and other options for treatment, the patient has consented to  Procedure(s) with comments: [place] (Left) - 150 as a surgical intervention.  The patient's history has been reviewed, patient examined, no change in status, stable for surgery.  I have reviewed the patient's chart and labs.  Questions were answered to the patient's satisfaction.     [name]

## 2022-01-11 NOTE — Progress Notes (Signed)
Talked to Brent Ray the Rep and he recommended that he didn't need to come and reprogram the ICD since the surgery is below the umbilicus ; Dr. Ola Spurr aware of what rep said and agrees with rep and does not need him to come in. No further orders received.

## 2022-01-11 NOTE — Anesthesia Postprocedure Evaluation (Signed)
Anesthesia Post Note  Patient: Brent Ray.  Procedure(s) Performed: TOTAL HIP ARTHROPLASTY ANTERIOR APPROACH (Left: Hip)     Patient location during evaluation: PACU Anesthesia Type: Spinal Level of consciousness: oriented and awake and alert Pain management: pain level controlled Vital Signs Assessment: post-procedure vital signs reviewed and stable Respiratory status: spontaneous breathing, respiratory function stable and patient connected to nasal cannula oxygen Cardiovascular status: blood pressure returned to baseline and stable Postop Assessment: no headache, no backache, no apparent nausea or vomiting, spinal receding and patient able to bend at knees Anesthetic complications: no   No notable events documented.  Last Vitals:  Vitals:   01/11/22 1345 01/11/22 1400  BP: 137/80 133/75  Pulse: 69 69  Resp: 19 14  Temp: 36.4 C   SpO2: 98% 99%    Last Pain:  Vitals:   01/11/22 1345  TempSrc:   PainSc: 0-No pain                 Gregorio Worley,W. EDMOND

## 2022-01-11 NOTE — Progress Notes (Signed)
PHARMACIST - PHYSICIAN ORDER COMMUNICATION  CONCERNING: P&T Medication Policy on Herbal Medications  DESCRIPTION:  This patient's order for:  Cranberry has been noted.  This product(s) is classified as an "herbal" or natural product. Due to a lack of definitive safety studies or FDA approval, nonstandard manufacturing practices, plus the potential risk of unknown drug-drug interactions while on inpatient medications, the Pharmacy and Therapeutics Committee does not permit the use of "herbal" or natural products of this type within [facility].   ACTION TAKEN: The pharmacy department is unable to verify this order at this time and your patient has been informed of this safety policy. Please reevaluate patient's clinical condition at discharge and address if the herbal or natural product(s) should be resumed at that time.   Lindell Spar, PharmD, BCPS Clinical Pharmacist 01/11/2022 4:11 PM

## 2022-01-11 NOTE — Transfer of Care (Signed)
Immediate Anesthesia Transfer of Care Note  Patient: Brent Ray.  Procedure(s) Performed: TOTAL HIP ARTHROPLASTY ANTERIOR APPROACH (Left: Hip)  Patient Location: PACU  Anesthesia Type:Spinal  Level of Consciousness: drowsy  Airway & Oxygen Therapy: Patient Spontanous Breathing and Patient connected to face mask  Post-op Assessment: Report given to RN and Post -op Vital signs reviewed and stable  Post vital signs: Reviewed and stable  Last Vitals:  Vitals Value Taken Time  BP 97/62 01/11/22 1027  Temp    Pulse 69 01/11/22 1029  Resp 12 01/11/22 1029  SpO2 99 % 01/11/22 1029  Vitals shown include unvalidated device data.  Last Pain:  Vitals:   01/11/22 0707  TempSrc:   PainSc: 0-No pain         Complications: No notable events documented.

## 2022-01-12 ENCOUNTER — Encounter (HOSPITAL_COMMUNITY): Payer: Self-pay | Admitting: Orthopedic Surgery

## 2022-01-12 DIAGNOSIS — M87052 Idiopathic aseptic necrosis of left femur: Secondary | ICD-10-CM | POA: Diagnosis not present

## 2022-01-12 LAB — CBC
HCT: 38.8 % — ABNORMAL LOW (ref 39.0–52.0)
Hemoglobin: 12.4 g/dL — ABNORMAL LOW (ref 13.0–17.0)
MCH: 31.1 pg (ref 26.0–34.0)
MCHC: 32 g/dL (ref 30.0–36.0)
MCV: 97.2 fL (ref 80.0–100.0)
Platelets: 210 10*3/uL (ref 150–400)
RBC: 3.99 MIL/uL — ABNORMAL LOW (ref 4.22–5.81)
RDW: 14.9 % (ref 11.5–15.5)
WBC: 17.6 10*3/uL — ABNORMAL HIGH (ref 4.0–10.5)
nRBC: 0 % (ref 0.0–0.2)

## 2022-01-12 LAB — BASIC METABOLIC PANEL
Anion gap: 8 (ref 5–15)
Anion gap: 9 (ref 5–15)
BUN: 18 mg/dL (ref 8–23)
BUN: 21 mg/dL (ref 8–23)
CO2: 20 mmol/L — ABNORMAL LOW (ref 22–32)
CO2: 24 mmol/L (ref 22–32)
Calcium: 8.8 mg/dL — ABNORMAL LOW (ref 8.9–10.3)
Calcium: 8.9 mg/dL (ref 8.9–10.3)
Chloride: 108 mmol/L (ref 98–111)
Chloride: 106 mmol/L (ref 98–111)
Creatinine, Ser: 1.96 mg/dL — ABNORMAL HIGH (ref 0.61–1.24)
Creatinine, Ser: 1.78 mg/dL — ABNORMAL HIGH (ref 0.61–1.24)
GFR, Estimated: 35 mL/min — ABNORMAL LOW (ref >60)
GFR, Estimated: 40 mL/min — ABNORMAL LOW (ref >60)
Glucose, Bld: 124 mg/dL — ABNORMAL HIGH (ref 70–99)
Glucose, Bld: 118 mg/dL — ABNORMAL HIGH (ref 70–99)
Potassium: 4.3 mmol/L (ref 3.5–5.1)
Potassium: 4.5 mmol/L (ref 3.5–5.1)
Sodium: 136 mmol/L (ref 135–145)
Sodium: 139 mmol/L (ref 135–145)

## 2022-01-12 NOTE — Progress Notes (Signed)
Physical Therapy Treatment Patient Details Name: Caisen Mangas. MRN: [id number] DOB: 11/27/1945 Today's Date: 01/12/2022   History of Present Illness Pt is a 76yo male presenting s/p L-THAAA on 01/11/22. PMH: afib, AICD, CHF, CKd, HTN, GERD, first degree heart block, HLD, rheumatoid arthritis, CAD    PT Comments    Pt requesting assist for dressing on arrival and provided his bags.  Pt able to take out his clothes and don sitting EOB without physical assist. Pt ambulated in hallway and performed standing and sitting exercises. Plan to return this afternoon and practice step and ambulate again in preparation for d/c home.   Recommendations for follow up therapy are one component of a multi-disciplinary discharge planning process, led by the attending physician.  Recommendations may be updated based on patient status, additional functional criteria and insurance authorization.  Follow Up Recommendations  Follow physician's recommendations for discharge plan and follow up therapies     Assistance Recommended at Discharge Set up Supervision/Assistance  Patient can return home with the following A little help with walking and/or transfers;A little help with bathing/dressing/bathroom;Assistance with cooking/housework;Assist for transportation;Help with stairs or ramp for entrance   Equipment Recommendations  Rolling walker (2 wheels);BSC/3in1    Recommendations for Other Services       Precautions / Restrictions Precautions Precautions: Fall Restrictions Weight Bearing Restrictions: No Other Position/Activity Restrictions: WBAT     Mobility  Bed Mobility Overal bed mobility: Needs Assistance Bed Mobility: Supine to Sit     Supine to sit: Supervision, HOB elevated          Transfers Overall transfer level: Needs assistance Equipment used: Rolling walker (2 wheels) Transfers: Sit to/from Stand Sit to Stand: Supervision           General transfer comment: pt able  to take clothing out of his bag and get dressed without assist at EOB    Ambulation/Gait Ambulation/Gait assistance: Min guard Gait Distance (Feet): 140 Feet Assistive device: Rolling walker (2 wheels) Gait Pattern/deviations: Step-to pattern, Antalgic, Decreased stance time - left Gait velocity: decreased     General Gait Details: verbal cues for sequence, RW positioning, step length, posture   Stairs             Wheelchair Mobility    Modified Rankin (Stroke Patients Only)       Balance                                            Cognition Arousal/Alertness: Awake/alert Behavior During Therapy: WFL for tasks assessed/performed Overall Cognitive Status: Within Functional Limits for tasks assessed                                          Exercises Total Joint Exercises Ankle Circles/Pumps: AROM, Both, 10 reps Hip ABduction/ADduction: AROM, Left, 10 reps, Standing Long Arc Quad: AROM, Left, Seated, 10 reps Knee Flexion: AROM, Left, Standing, 10 reps Marching in Standing: AROM, Left, Standing, 10 reps Standing Hip Extension: AROM, Left, Standing, 10 reps    General Comments        Pertinent Vitals/Pain Pain Assessment Pain Assessment: 0-10 Pain Score: 5  Pain Location: left hip Pain Descriptors / Indicators: Sore, Aching Pain Intervention(s): Repositioned, Monitored during session, Premedicated before session    Home  Living                          Prior Function            PT Goals (current goals can now be found in the care plan section) Progress towards PT goals: Progressing toward goals    Frequency    7X/week      PT Plan Current plan remains appropriate    Co-evaluation              AM-PAC PT "6 Clicks" Mobility   Outcome Measure  Help needed turning from your back to your side while in a flat bed without using bedrails?: None Help needed moving from lying on your back to  sitting on the side of a flat bed without using bedrails?: A Little Help needed moving to and from a bed to a chair (including a wheelchair)?: A Little Help needed standing up from a chair using your arms (e.g., wheelchair or bedside chair)?: A Little Help needed to walk in hospital room?: A Little Help needed climbing 3-5 steps with a railing? : A Little 6 Click Score: 19    End of Session Equipment Utilized During Treatment: Gait belt Activity Tolerance: Patient tolerated treatment well Patient left: in chair;with call bell/phone within reach   PT Visit Diagnosis: Pain;Difficulty in walking, not elsewhere classified (R26.2) Pain - Right/Left: Left Pain - part of body: Hip     Time: 1010-1037 PT Time Calculation (min) (ACUTE ONLY): 27 min  Charges:  $Gait Training: 8-22 mins $Therapeutic Exercise: 8-22 mins                     Jannette Spanner PT, DPT Physical Therapist Acute Rehabilitation Services Preferred contact method: Secure Chat Weekend Pager Only: [phone] Office: [phone]    Myrtis Hopping [place] 01/12/2022, 11:53 AM

## 2022-01-12 NOTE — Progress Notes (Signed)
Physical Therapy Treatment Patient Details Name: Brent Ray. MRN: [id number] DOB: Jan 12, 1946 Today's Date: 01/12/2022   History of Present Illness Pt is a 76yo male presenting s/p L-THAAA on 01/11/22. PMH: afib, AICD, CHF, CKd, HTN, GERD, first degree heart block, HLD, rheumatoid arthritis, CAD    PT Comments    Pt assisted with ambulating in hallway and practiced one step.  Pt reports he will d/c home tomorrow; he has no ride today.  RN notified. Pt provided with HEP handout.   Recommendations for follow up therapy are one component of a multi-disciplinary discharge planning process, led by the attending physician.  Recommendations may be updated based on patient status, additional functional criteria and insurance authorization.  Follow Up Recommendations  Follow physician's recommendations for discharge plan and follow up therapies     Assistance Recommended at Discharge Set up Supervision/Assistance  Patient can return home with the following A little help with walking and/or transfers;A little help with bathing/dressing/bathroom;Assistance with cooking/housework;Assist for transportation;Help with stairs or ramp for entrance   Equipment Recommendations  Rolling walker (2 wheels);BSC/3in1    Recommendations for Other Services       Precautions / Restrictions Precautions Precautions: Fall Restrictions Other Position/Activity Restrictions: WBAT     Mobility  Bed Mobility Overal bed mobility: Needs Assistance Bed Mobility: Sit to Supine     Supine to sit: Supervision, HOB elevated Sit to supine: Supervision, HOB elevated        Transfers Overall transfer level: Needs assistance Equipment used: Rolling walker (2 wheels) Transfers: Sit to/from Stand Sit to Stand: Supervision           General transfer comment: pt able to take clothing out of his bag and get dressed without assist at EOB    Ambulation/Gait Ambulation/Gait assistance: Min guard,  Supervision Gait Distance (Feet): 600 Feet Assistive device: Rolling walker (2 wheels) Gait Pattern/deviations: Step-to pattern, Antalgic, Decreased stance time - left Gait velocity: decreased     General Gait Details: verbal cues for sequence, RW positioning, step length, posture   Stairs Stairs: Yes Stairs assistance: Min guard Stair Management: Step to pattern, Forwards, With walker Number of Stairs: 1 General stair comments: verbal cues for safety and sequencing; performed twice   Wheelchair Mobility    Modified Rankin (Stroke Patients Only)       Balance                                            Cognition Arousal/Alertness: Awake/alert Behavior During Therapy: WFL for tasks assessed/performed Overall Cognitive Status: Within Functional Limits for tasks assessed                                             General Comments        Pertinent Vitals/Pain Pain Assessment Pain Assessment: 0-10 Pain Score: 6  Pain Location: left hip Pain Descriptors / Indicators: Sore, Aching Pain Intervention(s): Repositioned, Premedicated before session, Monitored during session    Home Living                          Prior Function            PT Goals (current goals can now be found in the  care plan section) Progress towards PT goals: Progressing toward goals    Frequency    7X/week      PT Plan Current plan remains appropriate    Co-evaluation              AM-PAC PT "6 Clicks" Mobility   Outcome Measure  Help needed turning from your back to your side while in a flat bed without using bedrails?: None Help needed moving from lying on your back to sitting on the side of a flat bed without using bedrails?: A Little Help needed moving to and from a bed to a chair (including a wheelchair)?: A Little Help needed standing up from a chair using your arms (e.g., wheelchair or bedside chair)?: A Little Help needed  to walk in hospital room?: A Little Help needed climbing 3-5 steps with a railing? : A Little 6 Click Score: 19    End of Session   Activity Tolerance: Patient tolerated treatment well Patient left: in bed;with call bell/phone within reach Nurse Communication: Mobility status PT Visit Diagnosis: Pain;Difficulty in walking, not elsewhere classified (R26.2) Pain - Right/Left: Left Pain - part of body: Hip     Time: 0623-7628 PT Time Calculation (min) (ACUTE ONLY): 23 min  Charges:  $Gait Training: 8-22 mins                    Arlyce Dice, DPT Physical Therapist Acute Rehabilitation Services Preferred contact method: Secure Chat Weekend Pager Only: [phone] Office: [phone]    Brent Ray 01/12/2022, 4:11 PM

## 2022-01-12 NOTE — Plan of Care (Signed)

## 2022-01-12 NOTE — TOC Transition Note (Signed)
Transition of Care [facility]) - CM/SW Discharge Note   Patient Details  Name: Brent Ray. MRN: [id number] Date of Birth: [birth year]  [facility] CM/SW Contact:  Lennart Pall, LCSW Phone Number: 01/12/2022, 9:39 AM   Clinical Narrative:    Met with pt and confirming need for rolling walker/ no DME agency preference.  RW ordered with [employer] for delivery to room.  Plan for HEP.  No further TOC needs.   Final next level of care: Home/Self Care Barriers to Discharge: No Barriers Identified   Patient Goals and CMS Choice Patient states their goals for this hospitalization and ongoing recovery are:: return home      Discharge Placement                       Discharge Plan and Services                DME Arranged: Walker rolling DME Agency: [employer] Date DME Agency Contacted: 01/12/22 Time DME Agency Contacted: [phone] Representative spoke with at DME Agency: Ovid Curd            Social Determinants of Health (SDOH) Interventions Food Insecurity Interventions: Intervention Not Indicated Housing Interventions: Intervention Not Indicated Transportation Interventions: Intervention Not Indicated Utilities Interventions: Intervention Not Indicated   Readmission Risk Interventions     No data to display

## 2022-01-12 NOTE — Progress Notes (Signed)
    Subjective:  Patient reports pain as mild to moderate.  Denies N/V/CP/SOB/Abd pain. He reports some expected pain around his incision this morning. Denies tingling and numbness in LE bilaterally.   Objective:   VITALS:   Vitals:   01/11/22 2100 01/11/22 2145 01/12/22 0121 01/12/22 0541  BP: (!) 144/79 (!) 144/79 136/82 (!) 140/110  Pulse: 70 70 69 71  Resp: '18 18 18 18  '$ Temp: 97.7 F (36.5 C) 97.7 F (36.5 C) 98.2 F (36.8 C) 97.6 F (36.4 C)  TempSrc: Oral Oral Oral Oral  SpO2: 98% 98% 98% 100%  Weight:      Height:        Patient is sitting up in bed. NAD.  Neurologically intact ABD soft Neurovascular intact Sensation intact distally Intact pulses distally Dorsiflexion/Plantar flexion intact Incision: dressing C/D/I No cellulitis present Compartment soft   Lab Results  Component Value Date   WBC 17.6 (H) 01/12/2022   HGB 12.4 (L) 01/12/2022   HCT 38.8 (L) 01/12/2022   MCV 97.2 01/12/2022   PLT 210 01/12/2022   BMET    Component Value Date/Time   NA 136 01/12/2022 0807   NA 143 12/31/2015 0901   K 4.3 01/12/2022 0807   CL 108 01/12/2022 0807   CO2 20 (L) 01/12/2022 0807   GLUCOSE 124 (H) 01/12/2022 0807   BUN 18 01/12/2022 0807   BUN 10 12/31/2015 0901   CREATININE 1.96 (H) 01/12/2022 0807   CREATININE 1.33 (H) 01/24/2016 1336   CALCIUM 8.8 (L) 01/12/2022 0807   GFRNONAA 35 (L) 01/12/2022 0807     Assessment/Plan: 1 Day Post-Op   Principal Problem:   Avascular necrosis of bone of left hip (HCC) Active Problems:   Avascular necrosis of hip, left (HCC)  CKD: Cr 1.96 this morning (Cr 1.59 01/06/22). Continue IV fluids. Recheck BMP 1400 today. If improved can d/c today.   WBAT with walker DVT ppx: Xarelto, SCDs, TEDS PO pain control PT/OT: Patient ambulated 70 feet with PT yesterday. Continue PT today.  Dispo: Cr 1.96 this morning (Cr 1.59 01/06/22). Continue IV fluids. Recheck BMP 1400 today. If improved can d/c today once cleared with  PT.    Charlott Rakes, PA-C 01/12/2022, 10:15 AM   [facility]  [place] [address], [place], [place] [address] Phone: [phone] [email or web address]  [employer]

## 2022-01-13 DIAGNOSIS — M87052 Idiopathic aseptic necrosis of left femur: Secondary | ICD-10-CM | POA: Diagnosis not present

## 2022-01-13 LAB — BASIC METABOLIC PANEL
Anion gap: 10 (ref 5–15)
BUN: 25 mg/dL — ABNORMAL HIGH (ref 8–23)
CO2: 16 mmol/L — ABNORMAL LOW (ref 22–32)
Calcium: 8.1 mg/dL — ABNORMAL LOW (ref 8.9–10.3)
Chloride: 107 mmol/L (ref 98–111)
Creatinine, Ser: 1.29 mg/dL — ABNORMAL HIGH (ref 0.61–1.24)
GFR, Estimated: 58 mL/min — ABNORMAL LOW (ref >60)
Glucose, Bld: 103 mg/dL — ABNORMAL HIGH (ref 70–99)
Potassium: 4.9 mmol/L (ref 3.5–5.1)
Sodium: 133 mmol/L — ABNORMAL LOW (ref 135–145)

## 2022-01-13 MED ORDER — FUROSEMIDE 20 MG PO TABS
20.0000 mg | ORAL_TABLET | Freq: Once | ORAL | Status: AC
  Administered 2022-01-13: 20 mg via ORAL
  Filled 2022-01-13: qty 1

## 2022-01-13 NOTE — Progress Notes (Signed)
Physical Therapy Treatment Patient Details Name: Brent Ray. MRN: [id number] DOB: [birth year] Today's Date: 01/13/2022   History of Present Illness Pt is a 76 yo male presenting s/p L-THAAA on 01/11/22. PMH: afib, AICD, CHF, CKd, HTN, GERD, first degree heart block, HLD, rheumatoid arthritis, CAD    PT Comments    Pt ambulated in hallway and reports sore hip today.  Pt also reports SOB improved and SPO2 91% on room air after ambulating.    Recommendations for follow up therapy are one component of a multi-disciplinary discharge planning process, led by the attending physician.  Recommendations may be updated based on patient status, additional functional criteria and insurance authorization.  Follow Up Recommendations  Follow physician's recommendations for discharge plan and follow up therapies     Assistance Recommended at Discharge Set up Supervision/Assistance  Patient can return home with the following A little help with walking and/or transfers;A little help with bathing/dressing/bathroom;Assistance with cooking/housework;Assist for transportation;Help with stairs or ramp for entrance   Equipment Recommendations  Rolling walker (2 wheels);BSC/3in1    Recommendations for Other Services       Precautions / Restrictions Precautions Precautions: Fall Restrictions Other Position/Activity Restrictions: WBAT     Mobility  Bed Mobility               General bed mobility comments: pt in recliner    Transfers Overall transfer level: Needs assistance Equipment used: Rolling walker (2 wheels) Transfers: Sit to/from Stand Sit to Stand: Supervision                Ambulation/Gait Ambulation/Gait assistance: Min guard, Supervision Gait Distance (Feet): 160 Feet Assistive device: Rolling walker (2 wheels) Gait Pattern/deviations: Step-to pattern, Antalgic, Decreased stance time - left Gait velocity: decreased     General Gait Details: verbal cues for  sequence, RW positioning, step length, posture   Stairs             Wheelchair Mobility    Modified Rankin (Stroke Patients Only)       Balance                                            Cognition Arousal/Alertness: Awake/alert Behavior During Therapy: WFL for tasks assessed/performed Overall Cognitive Status: Within Functional Limits for tasks assessed                                          Exercises      General Comments        Pertinent Vitals/Pain Pain Assessment Pain Assessment: 0-10 Pain Score: 6  Pain Location: left hip Pain Descriptors / Indicators: Sore Pain Intervention(s): Repositioned, Monitored during session    Home Living                          Prior Function            PT Goals (current goals can now be found in the care plan section) Progress towards PT goals: Progressing toward goals    Frequency    7X/week      PT Plan Current plan remains appropriate    Co-evaluation              AM-PAC PT "6 Clicks" Mobility  Outcome Measure  Help needed turning from your back to your side while in a flat bed without using bedrails?: None Help needed moving from lying on your back to sitting on the side of a flat bed without using bedrails?: A Little Help needed moving to and from a bed to a chair (including a wheelchair)?: A Little Help needed standing up from a chair using your arms (e.g., wheelchair or bedside chair)?: A Little Help needed to walk in hospital room?: A Little Help needed climbing 3-5 steps with a railing? : A Little 6 Click Score: 19    End of Session Equipment Utilized During Treatment: Gait belt Activity Tolerance: Patient tolerated treatment well Patient left: with call bell/phone within reach;in chair;with family/visitor present   PT Visit Diagnosis: Difficulty in walking, not elsewhere classified (R26.2)     Time: 9244-6286 PT Time Calculation  (min) (ACUTE ONLY): 13 min  Charges:  $Gait Training: 8-22 mins                    Arlyce Dice, DPT Physical Therapist Acute Rehabilitation Services Preferred contact method: Secure Chat Weekend Pager Only: [phone] Office: [place] 01/13/2022, 3:23 PM

## 2022-01-13 NOTE — Significant Event (Addendum)
Rapid Response Event Note   Reason for Call :  Increased SOB  Initial Focused Assessment:  Pt a&o x 4, standing at bedside, appears comfortable, c/o being unable to take full breath, bilateral breath sounds clear and diminished.  +1/+2 pitting edema to all extremities     Interventions:  Pt placed on monitor, o2 sats 100 with movement, HR NSR in 80's  Plan of Care:  PA made aware, PO lasix ordered   MD Notified: by pt RN Call Time: 6599 Arrival Time: 3570 End Time: 1779  Justice Britain, RN

## 2022-01-13 NOTE — Progress Notes (Signed)
Physical Therapy Treatment Patient Details Name: Brent Ray. MRN: [id number] DOB: 1945/05/03 Today's Date: 01/13/2022   History of Present Illness Pt is a 76 yo male presenting s/p L-THAA on 01/11/22. PMH: afib, AICD, CHF, CKd, HTN, GERD, first degree heart block, HLD, rheumatoid arthritis, CAD    PT Comments    Pt reports breathing much improved and ambulated good distance in hallway.  Pt denies SOB with activity and performed one step twice.  Pt mobilizing well and okay to d/c from PT standpoint.    Recommendations for follow up therapy are one component of a multi-disciplinary discharge planning process, led by the attending physician.  Recommendations may be updated based on patient status, additional functional criteria and insurance authorization.  Follow Up Recommendations  Follow physician's recommendations for discharge plan and follow up therapies     Assistance Recommended at Discharge Set up Supervision/Assistance  Patient can return home with the following A little help with walking and/or transfers;A little help with bathing/dressing/bathroom;Assistance with cooking/housework;Assist for transportation;Help with stairs or ramp for entrance   Equipment Recommendations  Rolling walker (2 wheels);BSC/3in1    Recommendations for Other Services       Precautions / Restrictions Precautions Precautions: Fall Restrictions Other Position/Activity Restrictions: WBAT     Mobility  Bed Mobility Overal bed mobility: Modified Independent             General bed mobility comments: pt in recliner    Transfers Overall transfer level: Needs assistance Equipment used: Rolling walker (2 wheels) Transfers: Sit to/from Stand Sit to Stand: Supervision                Ambulation/Gait Ambulation/Gait assistance: Supervision Gait Distance (Feet): 220 Feet Assistive device: Rolling walker (2 wheels) Gait Pattern/deviations: Step-to pattern, Antalgic, Decreased  stance time - left Gait velocity: decreased     General Gait Details: verbal cues for sequence, RW positioning, step length, posture   Stairs Stairs: Yes Stairs assistance: Min guard Stair Management: Step to pattern, Forwards, With walker Number of Stairs: 1 General stair comments: pt able to recall sequence, cues for safe technique and RW placement; performed twice   Wheelchair Mobility    Modified Rankin (Stroke Patients Only)       Balance                                            Cognition Arousal/Alertness: Awake/alert Behavior During Therapy: WFL for tasks assessed/performed Overall Cognitive Status: Within Functional Limits for tasks assessed                                          Exercises      General Comments        Pertinent Vitals/Pain Pain Assessment Pain Assessment: 0-10 Pain Score: 6  Pain Location: left hip Pain Descriptors / Indicators: Sore Pain Intervention(s): Repositioned, Monitored during session    Home Living                          Prior Function            PT Goals (current goals can now be found in the care plan section) Progress towards PT goals: Progressing toward goals    Frequency  7X/week      PT Plan Current plan remains appropriate    Co-evaluation              AM-PAC PT "6 Clicks" Mobility   Outcome Measure  Help needed turning from your back to your side while in a flat bed without using bedrails?: None Help needed moving from lying on your back to sitting on the side of a flat bed without using bedrails?: A Little Help needed moving to and from a bed to a chair (including a wheelchair)?: A Little Help needed standing up from a chair using your arms (e.g., wheelchair or bedside chair)?: A Little Help needed to walk in hospital room?: A Little Help needed climbing 3-5 steps with a railing? : A Little 6 Click Score: 19    End of Session  Equipment Utilized During Treatment: Gait belt Activity Tolerance: Patient tolerated treatment well Patient left: in bed;with call bell/phone within reach;with family/visitor present Nurse Communication: Mobility status PT Visit Diagnosis: Difficulty in walking, not elsewhere classified (R26.2)     Time: 8889-1694 PT Time Calculation (min) (ACUTE ONLY): 14 min  Charges:  $Gait Training: 8-22 mins                    Jannette Spanner PT, DPT Physical Therapist Acute Rehabilitation Services Preferred contact method: Secure Chat Weekend Pager Only: [phone] Office: [place] 01/13/2022, 3:27 PM

## 2022-01-13 NOTE — Progress Notes (Signed)
During assessment, patient complained of difficulty taking a deep breath. Patient felt like he needed to sit up higher in the bed and sit on the edge of the bed. Notified Jamie, Rapid response RN. Margretta Sidle, Agricultural consultant and PA [employer] were also notified. Vitals stable upon assessment by Rapid response RN. Lung sounds clear per rapid response RN and bedside RN. Patient says he takes Lasix at home. Requested order for Lasix to [facility], [place]. Larene Pickett, [place] putting in order for Lasix. Will continue to monitor patient.

## 2022-01-13 NOTE — Plan of Care (Signed)
  Problem: Education: Goal: Knowledge of General Education information will improve Description Including pain rating scale, medication(s)/side effects and non-pharmacologic comfort measures Outcome: Progressing   

## 2022-01-13 NOTE — Progress Notes (Signed)
    Subjective:  Patient reports pain as mild to moderate.  Denies N/V/CP/SOB/Abd pain. He reports some expected soreness in his thigh. Denies tingling and numbness in Le bilaterally.   Objective:   VITALS:   Vitals:   01/12/22 0541 01/12/22 1411 01/13/22 0203 01/13/22 0609  BP: (!) 140/110 (!) 139/95 (!) 161/87 (!) 153/87  Pulse: 71 69 78 70  Resp: '18 18 20 16  '$ Temp: 97.6 F (36.4 C) 97.6 F (36.4 C) 97.9 F (36.6 C) 98 F (36.7 C)  TempSrc: Oral  Oral Oral  SpO2: 100% 100% 100% 100%  Weight:      Height:        Patient sitting up in bed. NAD.  Neurologically intact ABD soft Neurovascular intact Sensation intact distally Intact pulses distally Dorsiflexion/Plantar flexion intact Incision: dressing C/D/I No cellulitis present Compartment soft Mild edema in LE bilaterally.   Lab Results  Component Value Date   WBC 17.6 (H) 01/12/2022   HGB 12.4 (L) 01/12/2022   HCT 38.8 (L) 01/12/2022   MCV 97.2 01/12/2022   PLT 210 01/12/2022   BMET    Component Value Date/Time   NA 133 (L) 01/13/2022 0352   NA 143 12/31/2015 0901   K 4.9 01/13/2022 0352   CL 107 01/13/2022 0352   CO2 16 (L) 01/13/2022 0352   GLUCOSE 103 (H) 01/13/2022 0352   BUN 25 (H) 01/13/2022 0352   BUN 10 12/31/2015 0901   CREATININE 1.29 (H) 01/13/2022 0352   CREATININE 1.33 (H) 01/24/2016 1336   CALCIUM 8.1 (L) 01/13/2022 0352   GFRNONAA 58 (L) 01/13/2022 0352     Assessment/Plan: 2 Days Post-Op   Principal Problem:   Avascular necrosis of bone of left hip (HCC) Active Problems:   Avascular necrosis of hip, left (HCC)  CKD: Cr 1.29  this morning that has continued to trend down from 1.96 yesterday (Cr 1.59 01/06/22). Continue IV fluids.  WBAT with walker DVT ppx: Xarelto, SCDs, TEDS PO pain control PT/OT: He ambulated 600 feet with PT yesterday.  Dispo: Creatinine has improved since yesterday. D/c home with HEP once cleared with PT.   Charlott Rakes, PA-C 01/13/2022, 8:27  AM   [facility]  [place] [address], [address], [place], [place] [address] Phone: [phone] [email or web address] Facebook  [employer]

## 2022-01-13 NOTE — Progress Notes (Signed)
Spoke with [employer]. She states the patient was feeling SOB after ambulating to the bathroom. Rapid response was called. Lungs sound clear to auscultation per rapid response team, and some pitting edema in bilateral LE noted. Patient reports difficulty with deep breaths. RN states that SPO2 is 100% on RA and with standing. Patient reports that he takes lasix '20mg'$  PRN at home, not on current medication list. He reports no allergy to medication. IV fluids discontinued and 1 dose of lasix '20mg'$  ordered. Continue to monitor until afternoon. If no other events reported and breathing improves patient can d/c home today.

## 2022-01-14 DIAGNOSIS — M87052 Idiopathic aseptic necrosis of left femur: Secondary | ICD-10-CM | POA: Diagnosis not present

## 2022-01-14 LAB — BASIC METABOLIC PANEL
Anion gap: 10 (ref 5–15)
BUN: 28 mg/dL — ABNORMAL HIGH (ref 8–23)
CO2: 19 mmol/L — ABNORMAL LOW (ref 22–32)
Calcium: 8.7 mg/dL — ABNORMAL LOW (ref 8.9–10.3)
Chloride: 109 mmol/L (ref 98–111)
Creatinine, Ser: 1.59 mg/dL — ABNORMAL HIGH (ref 0.61–1.24)
GFR, Estimated: 45 mL/min — ABNORMAL LOW (ref >60)
Glucose, Bld: 75 mg/dL (ref 70–99)
Potassium: 4.3 mmol/L (ref 3.5–5.1)
Sodium: 138 mmol/L (ref 135–145)

## 2022-01-14 NOTE — Progress Notes (Signed)
   Subjective: 3 Days Post-Op Procedure(s) (LRB): TOTAL HIP ARTHROPLASTY ANTERIOR APPROACH (Left)  Pt c/o moderate stiffness this morning Standing completing exercises this morning Ready for d/c home  Shortness of breath and swelling improved after lasix Denies any new symptoms Patient reports pain as mild.  Objective:   VITALS:   Vitals:   01/13/22 2028 01/14/22 0500  BP: (!) 165/85 (!) 167/81  Pulse: 71 72  Resp: 16 16  Temp: 98.1 F (36.7 C) 98.3 F (36.8 C)  SpO2:  100%    Left hip incision healing well Nv intact distally No rashes or edema Good rom without difficulty  LABS Recent Labs    01/12/22 0807  HGB 12.4*  HCT 38.8*  WBC 17.6*  PLT 210    Recent Labs    01/12/22 1339 01/13/22 0352 01/14/22 0329  NA 139 133* 138  K 4.5 4.9 4.3  BUN 21 25* 28*  CREATININE 1.78* 1.29* 1.59*  GLUCOSE 118* 103* 75     Assessment/Plan: 3 Days Post-Op Procedure(s) (LRB): TOTAL HIP ARTHROPLASTY ANTERIOR APPROACH (Left) Continue PT/OT D/c home today F/u in 2 weeks in the office  Pain management as needed     Kathrynn Speed, [place] is now [employer] [facility] [address], [place], [place], [place] [address] Phone: [phone] [email or web address] Facebook  [employer]

## 2022-01-14 NOTE — Discharge Summary (Signed)
In most cases prophylactic antibiotics for Dental procdeures after total joint surgery are not necessary.  Exceptions are as follows:  1. History of prior total joint infection  2. Severely immunocompromised (Organ Transplant, cancer chemotherapy, Rheumatoid biologic meds such as [place])  3. Poorly controlled diabetes (A1C &gt; 8.0, blood glucose over 200)  If you have one of these conditions, contact your surgeon for an antibiotic prescription, prior to your dental procedure. Orthopedic Discharge Summary        Physician Discharge Summary  Patient ID: Brent Ray. MRN: [id number] DOB/AGE: 1946-02-05 76 y.o.  Admit date: 01/11/2022 Discharge date: 01/14/2022   Procedures:  Procedure(s) (LRB): TOTAL HIP ARTHROPLASTY ANTERIOR APPROACH (Left)  Attending Physician:  Dr. Lyla Glassing  Admission Diagnoses:   avascular necrosis left hip  Discharge Diagnoses:  avascular necrosis of left hip   Past Medical History:  Diagnosis Date   A-fib [facility])    AICD (automatic cardioverter/defibrillator) present    Barrett's esophagus 2011   In [place]. Records in Epic   CHF (congestive heart failure) (HCC)    Chronic kidney disease    Coronary atherosclerosis of native coronary artery    Nonobstructive   Essential hypertension, benign    GERD (gastroesophageal reflux disease)    Heart block AV first degree    Chronic   History of kidney stones    Hypercholesterolemia    Mixed hyperlipidemia    NSVT (nonsustained ventricular tachycardia) ([place])    Holter monitor, 2011; recommended RF ablation by [name] - never required   PAF (paroxysmal atrial fibrillation) [facility])    Recently documented in [facility] June 2016   Pre-diabetes    Rheumatoid arthritis ([place])    Sinus bradycardia    Symptomatic, acebutolol discontinued   Skin cancer    Sleep apnea    pt denies    PCP: Moshe Cipro, MD   Discharged Condition: good  Hospital Course:  Patient underwent the above  stated procedure on 01/11/2022. Patient tolerated the procedure well and brought to the recovery room in good condition and subsequently to the floor. Patient had an uncomplicated hospital course and was stable for discharge.   Disposition: Discharge disposition: 01-Home or Self Care      with follow up in 2 weeks    Follow-up Information     Swinteck, Aaron Edelman, MD Follow up in 2 week(s).   Specialty: Orthopedic Surgery Why: For suture removal, For wound re-check Contact information: [address] [place] [address] [phone]                 Dental Antibiotics:  In most cases prophylactic antibiotics for Dental procdeures after total joint surgery are not necessary.  Exceptions are as follows:  1. History of prior total joint infection  2. Severely immunocompromised (Organ Transplant, cancer chemotherapy, Rheumatoid biologic meds such as [place])  3. Poorly controlled diabetes (A1C &gt; 8.0, blood glucose over 200)  If you have one of these conditions, contact your surgeon for an antibiotic prescription, prior to your dental procedure.  Discharge Instructions     Call MD / Call 911   Complete by: As directed    If you experience chest pain or shortness of breath, CALL 911 and be transported to the hospital emergency room.  If you develope a fever above 101 F, pus (white drainage) or increased drainage or redness at the wound, or calf pain, call your surgeon's office.   Constipation Prevention   Complete by: As directed  Drink plenty of fluids.  Prune juice may be helpful.  You may use a stool softener, such as Colace (over the counter) 100 mg twice a day.  Use MiraLax (over the counter) for constipation as needed.   Diet - low sodium heart healthy   Complete by: As directed    Increase activity slowly as tolerated   Complete by: As directed    Post-operative opioid taper instructions:   Complete by: As directed    POST-OPERATIVE OPIOID  TAPER INSTRUCTIONS: It is important to wean off of your opioid medication as soon as possible. If you do not need pain medication after your surgery it is ok to stop day one. Opioids include: Codeine, Hydrocodone(Norco, Vicodin), Oxycodone(Percocet, oxycontin) and hydromorphone amongst others.  Long term and even short term use of opiods can cause: Increased pain response Dependence Constipation Depression Respiratory depression And more.  Withdrawal symptoms can include Flu like symptoms Nausea, vomiting And more Techniques to manage these symptoms Hydrate well Eat regular healthy meals Stay active Use relaxation techniques(deep breathing, meditating, yoga) Do Not substitute Alcohol to help with tapering If you have been on opioids for less than two weeks and do not have pain than it is ok to stop all together.  Plan to wean off of opioids This plan should start within one week post op of your joint replacement. Maintain the same interval or time between taking each dose and first decrease the dose.  Cut the total daily intake of opioids by one tablet each day Next start to increase the time between doses. The last dose that should be eliminated is the evening dose.          Allergies as of 01/14/2022       Reactions   Amiodarone Other (See Comments)   Pt reports vision changes and walking changes    Flecainide Other (See Comments)   SEVERE HEADACHES   Flomax [tamsulosin Hcl] Hives, Itching   Itch, rash/hives   Lisinopril Nausea And Vomiting   Sacubitril-valsartan Nausea And Vomiting   Sulfasalazine Nausea Only   Statins Other (See Comments)   Leg cramps        Medication List     TAKE these medications    acetaminophen 325 MG tablet Commonly known as: TYLENOL Take 650 mg by mouth daily as needed for headache.   ALPRAZolam 0.5 MG 24 hr tablet Commonly known as: XANAX XR Take 0.5 mg by mouth as needed for anxiety.   carvedilol 6.25 MG tablet Commonly  known as: COREG Take 6.25 mg by mouth 2 (two) times daily with a meal.   Cranberry 400 MG Caps Take 400 mg by mouth daily.   cyanocobalamin 2000 MCG tablet Take 2,000 mcg by mouth daily.   losartan 50 MG tablet Commonly known as: COZAAR Take 50 mg by mouth daily.   Nitrostat 0.4 MG SL tablet Generic drug: nitroGLYCERIN DISSOLVE 1 TABLET UNDER TONGUE EVERY 5 MINUTES AS NEEDED FOR CHEST PAIN What changed: See the new instructions.   pantoprazole 40 MG tablet Commonly known as: PROTONIX Take 40 mg by mouth 2 (two) times daily.   potassium chloride SA 20 MEQ tablet Commonly known as: KLOR-CON M Take 20 mEq by mouth daily.   Xarelto 15 MG Tabs tablet Generic drug: Rivaroxaban Take 15 mg by mouth daily in the afternoon.   rivaroxaban 20 MG Tabs tablet Commonly known as: XARELTO Take 1 tablet (20 mg total) by mouth daily with supper.   rosuvastatin 5 MG  tablet Commonly known as: CRESTOR Take 5 mg by mouth 2 (two) times a week.   traMADol 50 MG tablet Commonly known as: ULTRAM Take 50 mg by mouth daily as needed for moderate pain.          Signed: [place] Bruns 01/14/2022, 8:09 AM  [facility] is now [employer] [address], [place], [place], [place] [address] Phone: [place]

## 2022-01-14 NOTE — Progress Notes (Signed)
Physical Therapy Treatment Patient Details Name: Brent Ray. MRN: [id number] DOB: 04-13-1945 Today's Date: 01/14/2022   History of Present Illness Pt is a 76 yo male presenting s/p L-THAA on 01/11/22. PMH: afib, AICD, CHF, CKd, HTN, GERD, first degree heart block, HLD, rheumatoid arthritis, CAD    PT Comments    Pt continues to participate well. Reviewed/practiced exercises and gait training. Encouraged pt to ambulate often at home and to follow HEP 2x/day as tolerated.  All PT education completed.    Recommendations for follow up therapy are one component of a multi-disciplinary discharge planning process, led by the attending physician.  Recommendations may be updated based on patient status, additional functional criteria and insurance authorization.  Follow Up Recommendations  Follow physician's recommendations for discharge plan and follow up therapies     Assistance Recommended at Discharge PRN  Patient can return home with the following A little help with walking and/or transfers;A little help with bathing/dressing/bathroom;Assistance with cooking/housework;Assist for transportation;Help with stairs or ramp for entrance   Equipment Recommendations  Rolling walker (2 wheels);BSC/3in1    Recommendations for Other Services       Precautions / Restrictions Precautions Precautions: Fall Restrictions Weight Bearing Restrictions: No Other Position/Activity Restrictions: WBAT     Mobility  Bed Mobility Overal bed mobility: Needs Assistance Bed Mobility: Supine to Sit, Sit to Supine     Supine to sit: Supervision, HOB elevated Sit to supine: Supervision, HOB elevated   General bed mobility comments: increased time.    Transfers Overall transfer level: Needs assistance Equipment used: Rolling walker (2 wheels) Transfers: Sit to/from Stand Sit to Stand: Supervision           General transfer comment: Cues for safety, hand placement.     Ambulation/Gait Ambulation/Gait assistance: Supervision Gait Distance (Feet): 200 Feet Assistive device: Rolling walker (2 wheels) Gait Pattern/deviations: Step-through pattern, Decreased stride length, Decreased step length - left       General Gait Details: Supv for safety. Slow but steady gait. Tolerated distance well. Cues for posture.   Stairs             Wheelchair Mobility    Modified Rankin (Stroke Patients Only)       Balance Overall balance assessment: Needs assistance         Standing balance support: Reliant on assistive device for balance, During functional activity, Bilateral upper extremity supported Standing balance-Leahy Scale: Poor                              Cognition Arousal/Alertness: Awake/alert Behavior During Therapy: WFL for tasks assessed/performed Overall Cognitive Status: Within Functional Limits for tasks assessed                                          Exercises Total Joint Exercises Ankle Circles/Pumps: AROM, Both, 10 reps Quad Sets: AROM, Both, 10 reps Heel Slides: AAROM, Left, 10 reps Hip ABduction/ADduction: AROM, Left, 10 reps, Supine, Standing Knee Flexion: AROM, Left, 10 reps, Standing Marching in Standing: AROM, Both, 10 reps, Standing    General Comments        Pertinent Vitals/Pain Pain Assessment Pain Assessment: 0-10 Pain Score: 5  Pain Location: left hip Pain Descriptors / Indicators: Discomfort, Sore, Tightness Pain Intervention(s): Monitored during session, Repositioned, Ice applied    Home Living  Prior Function            PT Goals (current goals can now be found in the care plan section) Progress towards PT goals: Progressing toward goals    Frequency    7X/week      PT Plan Current plan remains appropriate    Co-evaluation              AM-PAC PT "6 Clicks" Mobility   Outcome Measure  Help needed turning from  your back to your side while in a flat bed without using bedrails?: None Help needed moving from lying on your back to sitting on the side of a flat bed without using bedrails?: None Help needed moving to and from a bed to a chair (including a wheelchair)?: A Little Help needed standing up from a chair using your arms (e.g., wheelchair or bedside chair)?: A Little Help needed to walk in hospital room?: A Little Help needed climbing 3-5 steps with a railing? : A Little 6 Click Score: 20    End of Session Equipment Utilized During Treatment: Gait belt Activity Tolerance: Patient tolerated treatment well Patient left: in bed;with call bell/phone within reach;with bed alarm set   PT Visit Diagnosis: Difficulty in walking, not elsewhere classified (R26.2) Pain - Right/Left: Left Pain - part of body: Hip     Time: 6950-7225 PT Time Calculation (min) (ACUTE ONLY): 22 min  Charges:  $Gait Training: 8-22 mins                        Doreatha Massed, PT Acute Rehabilitation  Office: [phone]

## 2022-05-10 ENCOUNTER — Other Ambulatory Visit (HOSPITAL_COMMUNITY): Payer: Self-pay | Admitting: Orthopedic Surgery

## 2022-05-10 DIAGNOSIS — R0989 Other specified symptoms and signs involving the circulatory and respiratory systems: Secondary | ICD-10-CM

## 2022-05-11 ENCOUNTER — Ambulatory Visit (HOSPITAL_COMMUNITY)
Admission: RE | Admit: 2022-05-11 | Discharge: 2022-05-11 | Disposition: A | Discharge status: Home / Self Care | Payer: Medicare PPO | Source: Ambulatory Visit | Attending: Internal Medicine | Admitting: Internal Medicine

## 2022-05-11 DIAGNOSIS — R0989 Other specified symptoms and signs involving the circulatory and respiratory systems: Secondary | ICD-10-CM

## 2022-05-11 LAB — VAS US ABI WITH/WO TBI
Left ABI: 1.13
Right ABI: 1.31

## 2023-01-15 ENCOUNTER — Ambulatory Visit: Payer: Medicare PPO | Attending: Internal Medicine | Admitting: Internal Medicine

## 2023-01-15 ENCOUNTER — Encounter: Payer: Self-pay | Admitting: Internal Medicine

## 2023-01-15 VITALS — BP 126/82 | HR 79 | Ht 68.0 in | Wt 126.6 lb

## 2023-01-15 DIAGNOSIS — I10 Essential (primary) hypertension: Secondary | ICD-10-CM | POA: Diagnosis not present

## 2023-01-15 DIAGNOSIS — T50905D Adverse effect of unspecified drugs, medicaments and biological substances, subsequent encounter: Secondary | ICD-10-CM

## 2023-01-15 DIAGNOSIS — I440 Atrioventricular block, first degree: Secondary | ICD-10-CM | POA: Diagnosis not present

## 2023-01-15 DIAGNOSIS — I340 Nonrheumatic mitral (valve) insufficiency: Secondary | ICD-10-CM

## 2023-01-15 DIAGNOSIS — I4892 Unspecified atrial flutter: Secondary | ICD-10-CM

## 2023-01-15 DIAGNOSIS — T50905A Adverse effect of unspecified drugs, medicaments and biological substances, initial encounter: Secondary | ICD-10-CM

## 2023-01-15 DIAGNOSIS — N1831 Chronic kidney disease, stage 3a: Secondary | ICD-10-CM

## 2023-01-15 DIAGNOSIS — Z8719 Personal history of other diseases of the digestive system: Secondary | ICD-10-CM

## 2023-01-15 DIAGNOSIS — I071 Rheumatic tricuspid insufficiency: Secondary | ICD-10-CM

## 2023-01-15 DIAGNOSIS — I5022 Chronic systolic (congestive) heart failure: Secondary | ICD-10-CM | POA: Diagnosis not present

## 2023-01-15 DIAGNOSIS — I429 Cardiomyopathy, unspecified: Secondary | ICD-10-CM | POA: Diagnosis not present

## 2023-01-15 DIAGNOSIS — N62 Hypertrophy of breast: Secondary | ICD-10-CM

## 2023-01-15 NOTE — Progress Notes (Signed)
Cardiology Office Note  Date: 01/15/2023   ID: Brent Shorts., DOB December 12, 1945, MRN [id number]  PCP:  Romeo Rabon, MD  Cardiologist:  Marjo Bicker, MD Electrophysiologist:  None   History of Present Illness: Brent Ray. is a 77 y.o. 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  I reviewed the records from [facility] and updated PMH as stated above. Patient was diagnosed with dilated cardiomyopathy based on TTE from 2022 that showed LVEF 30 to 35%, abnormal septal motion consistent with paced rhythm, at least moderate MR/TR, RV is hypokinetic. He underwent R/LHC in 2022 that showed calcified proximal vessels, 20% LCx, 20% OM1, 50% pRCA (with negative FFR), LVEDP 7, PCWP 14, PA 24/15/19, RV 21/6, RA 6, CO 5.9, CI 3.5.  He had been pacemaker dual-chamber in 2017 for unclear reason that was upgraded to CRT-D in 2023 due to drop in LVEF, RV lead extraction was performed.  He underwent multiple ablations for A-fib and based on his device interrogations, he continued to be in A-fib.  He did not tolerate spironolactone, had gynecomastia, spironolactone was switched to eplerenone.  Eventually, patient stopped taking all his medications as he did not tolerate meds and was having SOB with bilateral lower extremity swelling when he was taking his medications.  His BMI is 19.25, very low.  Patient is here, accompanied by wife.  Continues to be off GDMT, feeling and doing great, does not have any symptoms of DOE, orthopnea, PND, leg swelling. Denies having angina. Currently taking  rivaroxaban 15 mg daily with supper and rosuvastatin 5 mg 2 times weekly.  Past Medical History:  Diagnosis Date   A-fib [facility])    AICD (automatic cardioverter/defibrillator) present    Barrett's esophagus 2011   In [place]. Records in Epic   CHF (congestive heart failure) (HCC)    Chronic kidney disease    Coronary atherosclerosis of native coronary artery    Nonobstructive   Essential hypertension, benign    GERD (gastroesophageal reflux disease)    Heart block AV first degree    Chronic   History of kidney stones    Hypercholesterolemia    Mixed hyperlipidemia    NSVT (nonsustained ventricular tachycardia) (HCC)    Holter monitor, 2011; recommended RF ablation by Dr. Johney Frame - never required   PAF (paroxysmal atrial fibrillation) [facility])    Recently documented in [facility] June 2016   Pre-diabetes    Rheumatoid arthritis (HCC)    Sinus bradycardia    Symptomatic, acebutolol discontinued   Skin cancer    Sleep apnea    pt denies    Past Surgical History:  Procedure Laterality Date   CARDIAC CATHETERIZATION N/A 12/13/2015   Procedure: Left Heart Cath and Coronary Angiography;  Surgeon: Runell Gess, MD;  Location: [facility] INVASIVE CV LAB;  Service: Cardiovascular;  Laterality: N/A;   CARDIOVERSION N/A 04/11/2016   Procedure: CARDIOVERSION;  Surgeon: Chilton Si, MD;  Location: [facility] ENDOSCOPY;  Service: Cardiovascular;  Laterality: N/A;   COLONOSCOPY  03/13/2012   [place], [place]--Dr. Spainhour   EP IMPLANTABLE DEVICE N/A 02/01/2016  Procedure: Pacemaker Implant;  Surgeon: Hillis Range, MD;  Location: [facility] INVASIVE CV LAB;  Service: Cardiovascular;  Laterality: N/A;   ESOPHAGOGASTRODUODENOSCOPY  03/13/2012   [place], [place]--Dr. Spainhour   HERNIA REPAIR     2   KIDNEY STONE SURGERY     LEFT HEART CATHETERIZATION WITH CORONARY ANGIOGRAM N/A 08/20/2012   Procedure: LEFT HEART CATHETERIZATION WITH CORONARY ANGIOGRAM;  Surgeon: [name], MD;  Location: [facility] CATH LAB;  Service:  Cardiovascular;  Laterality: N/A;   Left leg surgery  03/13/1977   SKIN CANCER EXCISION     TOTAL HIP ARTHROPLASTY Left 01/11/2022   Procedure: TOTAL HIP ARTHROPLASTY ANTERIOR APPROACH;  Surgeon: Samson Frederic, MD;  Location: [facility] ORS;  Service: Orthopedics;  Laterality: Left;  150    Current Outpatient Medications  Medication Sig Dispense Refill   acetaminophen (TYLENOL) 325 MG tablet Take 650 mg by mouth daily as needed for headache.     budesonide (RHINOCORT AQUA) 32 MCG/ACT nasal spray Place 1 spray into both nostrils daily.     cephALEXin (KEFLEX) 500 MG capsule Take 500 mg by mouth.     Cranberry 400 MG CAPS Take 400 mg by mouth daily.     cyanocobalamin 2000 MCG tablet Take 2,000 mcg by mouth daily.     folic acid (FOLVITE) 1 MG tablet Take 1 mg by mouth daily.     ipratropium (ATROVENT) 0.06 % nasal spray Place 1 spray into both nostrils daily as needed for rhinitis.     NITROSTAT 0.4 MG SL tablet DISSOLVE 1 TABLET UNDER TONGUE EVERY 5 MINUTES AS NEEDED FOR CHEST PAIN (Patient taking differently: Place 0.4 mg under the tongue every 5 (five) minutes as needed for chest pain.) 25 tablet 3   pantoprazole (PROTONIX) 40 MG tablet Take 40 mg by mouth every other day.     potassium chloride SA (KLOR-CON M) 20 MEQ tablet Take 20 mEq by mouth daily.     Rivaroxaban (XARELTO) 15 MG TABS tablet Take 15 mg by mouth daily in the afternoon.     rosuvastatin (CRESTOR) 5 MG tablet Take 5 mg by mouth 2 (two) times a week.     traMADol (ULTRAM) 50 MG tablet Take 50 mg by mouth daily as needed for moderate pain.     ALPRAZolam (XANAX XR) 0.5 MG 24 hr tablet Take 0.5 mg by mouth as needed for anxiety. (Patient not taking: Reported on 01/06/2022)     carvedilol (COREG) 6.25 MG tablet Take 6.25 mg by mouth 2 (two) times daily with a meal. (Patient not taking: Reported on 01/15/2023)     losartan (COZAAR) 50 MG tablet Take 50 mg by mouth daily. (Patient not taking: Reported on 01/06/2022)     rivaroxaban  (XARELTO) 20 MG TABS tablet Take 1 tablet (20 mg total) by mouth daily with supper. (Patient not taking: Reported on 01/15/2023) 90 tablet 3   No current facility-administered medications for this visit.   Allergies:  Amiodarone, Flecainide, Flomax [tamsulosin hcl], Lisinopril, Sacubitril-valsartan, Sulfasalazine, and Statins   Social History: The patient  reports that he has never smoked. He has never used smokeless tobacco. He reports current alcohol use of about 2.0 standard drinks of alcohol per week. He reports that he does not use drugs.   Family History: The patient's family history includes Colon cancer in his mother; Other in his sister, sister, sister, and son.   ROS:  Please see the history of present illness. Otherwise, complete review of systems is positive for none.  All other systems are reviewed and negative.   Physical Exam: VS:  BP 126/82 (BP Location: Left Arm)   Pulse 79   Ht 5\' 8"  (1.727 m)   Wt 126 lb 9.6 oz (57.4 kg)   SpO2 98%   BMI 19.25 kg/m , BMI Body mass index is 19.25 kg/m.  Wt Readings from Last 3 Encounters:  01/15/23 126 lb 9.6 oz (57.4 kg)  01/11/22 136 lb 6.4 oz (61.9 kg)  01/06/22 136 lb 6.4 oz (61.9 kg)    General: Patient appears comfortable at rest. HEENT: Conjunctiva and lids normal, oropharynx clear with moist mucosa. Neck: Supple, no elevated JVP or carotid bruits, no thyromegaly. Lungs: Clear to auscultation, nonlabored breathing at rest. Cardiac: Regular rate and rhythm, no S3 or significant systolic murmur, no pericardial rub. Abdomen: Soft, nontender, no hepatomegaly, bowel sounds present, no guarding or rebound. Extremities: No pitting edema, distal pulses 2+. Skin: Warm and dry. Musculoskeletal: No kyphosis. Neuropsychiatric: Alert and oriented x3, affect grossly appropriate.  Recent Labwork: No results found for requested labs within last 365 days.     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 Currently has no symptoms of DOE, orthopnea, PND or bilateral lower EXTR swelling. No angina either. Unclear if is due to his low BMI 19.25, he is not able to tolerate any medications. Instructed him to increase p.o. intake, gain weight and probably can start him on small doses of GDMT in the next clinic visit. Will obtain 2D echocardiogram and refer to electrophysiology for device checks. -Continue Xarelto 15 mg once daily with supper.    I spent a total duration of 60 minutes (30 minutes reviewing prior medical records from [facility] Everywhere and from [facility]), labs, EKG, face-to-face discussion/counseling of his medical condition, pathophysiology, evaluation, management, ordering tests/referrals and documenting the findings in the note.   Medication Adjustments/Labs and Tests Ordered: Current medicines are reviewed at length with the patient today.  Concerns regarding medicines are outlined above.    Disposition:  Follow up 3 months  Signed, Kyarah Enamorado Verne Spurr, MD, 01/15/2023 5:00 PM    [facility] [address] [place], [place] [address]

## 2023-01-15 NOTE — Patient Instructions (Signed)
Medication Instructions:  Your physician recommends that you continue on your current medications as directed. Please refer to the Current Medication list given to you today.  *If you need a refill on your cardiac medications before your next appointment, please call your pharmacy*   Lab Work: None If you have labs (blood work) drawn today and your tests are completely normal, you will receive your results only by: MyChart Message (if you have MyChart) OR A paper copy in the mail If you have any lab test that is abnormal or we need to change your treatment, we will call you to review the results.   Testing/Procedures: Your physician has requested that you have an echocardiogram. Echocardiography is a painless test that uses sound waves to create images of your heart. It provides your doctor with information about the size and shape of your heart and how well your heart's chambers and valves are working. This procedure takes approximately one hour. There are no restrictions for this procedure. Please do NOT wear cologne, perfume, aftershave, or lotions (deodorant is allowed). Please arrive 15 minutes prior to your appointment time.  Please note: We ask at that you not bring children with you during ultrasound (echo/ vascular) testing. Due to room size and safety concerns, children are not allowed in the ultrasound rooms during exams. Our front office staff cannot provide observation of children in our lobby area while testing is being conducted. An adult accompanying a patient to their appointment will only be allowed in the ultrasound room at the discretion of the ultrasound technician under special circumstances. We apologize for any inconvenience.    Follow-Up: At [facility], you and your health needs are our priority.  As part of our continuing mission to provide you with exceptional heart care, we have created designated Provider Care Teams.  These Care Teams include your  primary Cardiologist (physician) and Advanced Practice Providers (APPs -  Physician Assistants and Nurse Practitioners) who all work together to provide you with the care you need, when you need it.  We recommend signing up for the patient portal called "MyChart".  Sign up information is provided on this After Visit Summary.  MyChart is used to connect with patients for Virtual Visits (Telemedicine).  Patients are able to view lab/test results, encounter notes, upcoming appointments, etc.  Non-urgent messages can be sent to your provider as well.   To learn more about what you can do with MyChart, go to [email or web address].    Your next appointment:   3 month(s)  Provider:   You may see Vishnu Norton Pastel, MD or the following Advanced Practice Provider on your designated Care Team:   Sharlene Dory, NP    Other Instructions

## 2023-01-19 DIAGNOSIS — Z8719 Personal history of other diseases of the digestive system: Secondary | ICD-10-CM | POA: Insufficient documentation

## 2023-01-19 DIAGNOSIS — I5022 Chronic systolic (congestive) heart failure: Secondary | ICD-10-CM | POA: Insufficient documentation

## 2023-01-19 DIAGNOSIS — I4892 Unspecified atrial flutter: Secondary | ICD-10-CM | POA: Insufficient documentation

## 2023-01-19 DIAGNOSIS — N183 Chronic kidney disease, stage 3 unspecified: Secondary | ICD-10-CM | POA: Insufficient documentation

## 2023-01-19 DIAGNOSIS — I340 Nonrheumatic mitral (valve) insufficiency: Secondary | ICD-10-CM | POA: Insufficient documentation

## 2023-01-19 DIAGNOSIS — I071 Rheumatic tricuspid insufficiency: Secondary | ICD-10-CM | POA: Insufficient documentation

## 2023-01-19 DIAGNOSIS — T50905A Adverse effect of unspecified drugs, medicaments and biological substances, initial encounter: Secondary | ICD-10-CM | POA: Insufficient documentation

## 2023-01-19 DIAGNOSIS — I5023 Acute on chronic systolic (congestive) heart failure: Secondary | ICD-10-CM | POA: Insufficient documentation

## 2023-01-19 DIAGNOSIS — N62 Hypertrophy of breast: Secondary | ICD-10-CM | POA: Insufficient documentation

## 2023-01-29 ENCOUNTER — Ambulatory Visit: Payer: Medicare PPO | Attending: Internal Medicine

## 2023-01-29 DIAGNOSIS — I088 Other rheumatic multiple valve diseases: Secondary | ICD-10-CM

## 2023-01-29 DIAGNOSIS — I429 Cardiomyopathy, unspecified: Secondary | ICD-10-CM

## 2023-01-29 DIAGNOSIS — I517 Cardiomegaly: Secondary | ICD-10-CM

## 2023-01-30 LAB — ECHOCARDIOGRAM COMPLETE
AR max vel: 2.17 cm2
AV Area VTI: 1.88 cm2
AV Area mean vel: 2.23 cm2
AV Mean grad: 2 mm[Hg]
AV Peak grad: 3.4 mm[Hg]
Ao pk vel: 0.92 m/s
Calc EF: 32.9 %
MV M vel: 5.02 m/s
MV Peak grad: 100.8 mm[Hg]
MV VTI: 1.64 cm2
S' Lateral: 4.2 cm
Single Plane A2C EF: 32 %
Single Plane A4C EF: 31.6 %

## 2023-03-12 ENCOUNTER — Encounter: Payer: Self-pay | Admitting: Cardiovascular Disease

## 2023-03-12 ENCOUNTER — Ambulatory Visit: Payer: Medicare PPO | Attending: Cardiovascular Disease | Admitting: Cardiovascular Disease

## 2023-03-12 VITALS — BP 140/90 | HR 110 | Ht 68.0 in | Wt 130.2 lb

## 2023-03-12 DIAGNOSIS — I5022 Chronic systolic (congestive) heart failure: Secondary | ICD-10-CM | POA: Diagnosis not present

## 2023-03-12 DIAGNOSIS — I429 Cardiomyopathy, unspecified: Secondary | ICD-10-CM | POA: Diagnosis not present

## 2023-03-12 DIAGNOSIS — I4892 Unspecified atrial flutter: Secondary | ICD-10-CM | POA: Diagnosis not present

## 2023-03-12 DIAGNOSIS — I440 Atrioventricular block, first degree: Secondary | ICD-10-CM | POA: Diagnosis not present

## 2023-03-12 LAB — CUP PACEART INCLINIC DEVICE CHECK
Date Time Interrogation Session: 20241230101117
Implantable Lead Connection Status: 753985
Implantable Lead Connection Status: 753985
Implantable Lead Implant Date: 20171121
Implantable Lead Implant Date: 20230414
Implantable Lead Location: 753859
Implantable Lead Location: 753860
Implantable Lead Model: 5076
Implantable Pulse Generator Implant Date: 20230414

## 2023-03-12 MED ORDER — METOPROLOL TARTRATE 25 MG PO TABS: 12.5000 mg | ORAL_TABLET | Freq: Two times a day (BID) | ORAL | 3 refills | Status: DC

## 2023-03-12 NOTE — Progress Notes (Signed)
Electrophysiology Office Note:    Date:  03/12/2023   ID:  Brent Flaws., DOB December 21, 1945, MRN [id number]  PCP:  Romeo Rabon, MD   [facility] Providers Cardiologist:  Marjo Bicker, MD     Referring MD: Marjo Bicker, MD   History of Present Illness:    Brent Dorais. is a 77 y.o. male with a medical history significant for nonischemic cardiomyopathy LVEF 25 to 30%, permanent atrial fibrillation s/p multiple interventions, severe TR and moderate MR referred for device management.     The patient had a dual chamber pacemaker placed in 2017. This was upgraded to a CRT defibrillator in 2023 following the development of cardiomyopathy. He has a history of atrial fibrillation and underwent pulmonary vein isolation in 2018, followed by another ablation with mitral flutter line in 2019. He has had an alcohol ablation of the VOM.  He has not well tolerated GDMT in the past.  He was on carvedilol and eplerenone but had progression in shortness of breath and dependent edema until he discontinued these medications --carvedilol in February and eplerenone in May 2024.  He reports that his shortness of breath, fatigue, edema improved abruptly.  This correlates with the improvement in his OptiVol status on his device as well.      Today, he reports that he has baseline.  He has decent energy levels, no dependent edema.  EKGs/Labs/Other Studies Reviewed Today:     Echocardiogram:  TTE November 2024 EF 30 to 35%.  Mildly dilated left atrium.  Moderate MR.    EKG:   EKG Interpretation Date/Time:  Monday March 12 2023 09:14:57 EST Ventricular Rate:  110 PR Interval:    QRS Duration:  152 QT Interval:  394 QTC Calculation: 533 R Axis:   270  Text Interpretation: Ventricular-paced rhythm Biventricular pacemaker detected Frequent PVCs When compared with ECG of 15-Jan-2023 09:28, PVCs now present Confirmed by York Pellant [phone]) on 03/12/2023  9:24:53 AM     Physical Exam:    VS:  BP (!) 140/90 (BP Location: Left Arm, Patient Position: Sitting, Cuff Size: Normal)   Pulse (!) 110   Ht 5\' 8"  (1.727 m)   Wt 130 lb 3.2 oz (59.1 kg)   SpO2 99%   BMI 19.80 kg/m     Wt Readings from Last 3 Encounters:  03/12/23 130 lb 3.2 oz (59.1 kg)  01/15/23 126 lb 9.6 oz (57.4 kg)  01/11/22 136 lb 6.4 oz (61.9 kg)     GEN: Well nourished, well developed in no acute distress CARDIAC: RRR, no murmurs, rubs, gallops The device site is normal -- no tenderness, edema, drainage, redness, threatened erosion.  RESPIRATORY:  Normal work of breathing MUSCULOSKELETAL: no edema    ASSESSMENT & PLAN:     Nonischemic cardiomyopathy EF 30 to 35% He did not tolerate GDMT and remains off these medications CRT-D in place, functioning normally I reviewed today's device interrogation.  See Paceart for details. Will start metoprolol to tartrate 12.5 mg twice daily.  He is aware that he may discontinue it if his symptoms worsen. If he does not tolerate metoprolol, may consider bisoprolol  History of symptomatic bradycardia, heart block Original dual-chamber pacemaker placed in 2017 has been replaced with a CRT-D device in April, 2023  Frequent PVCs BiV pacing is 87% Hopefully, he will be able to tolerate a little metoprolol  Permanent atrial fibrillation Status post multiple ablations including vein of Marshall alcohol ablation.  Signed, Roberts Gaudy  Rosita Guzzetta, MD  03/12/2023 9:25 AM    [facility]

## 2023-03-12 NOTE — Patient Instructions (Signed)
Medication Instructions:  START Metoprolol tartrate 12.5 mg twice daily  *If you need a refill on your cardiac medications before your next appointment, please call your pharmacy*  Follow-Up: At [facility], you and your health needs are our priority.  As part of our continuing mission to provide you with exceptional heart care, we have created designated Provider Care Teams.  These Care Teams include your primary Cardiologist (physician) and Advanced Practice Providers (APPs -  Physician Assistants and Nurse Practitioners) who all work together to provide you with the care you need, when you need it.  We recommend signing up for the patient portal called "MyChart".  Sign up information is provided on this After Visit Summary.  MyChart is used to connect with patients for Virtual Visits (Telemedicine).  Patients are able to view lab/test results, encounter notes, upcoming appointments, etc.  Non-urgent messages can be sent to your provider as well.   To learn more about what you can do with MyChart, go to [email or web address].    Your next appointment:   3 month(s)  Provider:   York Pellant, MD

## 2023-03-28 ENCOUNTER — Ambulatory Visit: Payer: Medicare Other | Attending: Internal Medicine | Admitting: Internal Medicine

## 2023-03-28 VITALS — BP 118/72 | HR 52 | Ht 68.0 in | Wt 131.6 lb

## 2023-03-28 DIAGNOSIS — I5022 Chronic systolic (congestive) heart failure: Secondary | ICD-10-CM

## 2023-03-28 DIAGNOSIS — I5023 Acute on chronic systolic (congestive) heart failure: Secondary | ICD-10-CM | POA: Diagnosis not present

## 2023-03-28 DIAGNOSIS — Z79899 Other long term (current) drug therapy: Secondary | ICD-10-CM

## 2023-03-28 MED ORDER — BISOPROLOL FUMARATE 5 MG PO TABS: 2.5000 mg | ORAL_TABLET | Freq: Every day | ORAL | 1 refills | Status: DC

## 2023-03-28 MED ORDER — FUROSEMIDE 20 MG PO TABS: 20.0000 mg | ORAL_TABLET | Freq: Every day | ORAL | 3 refills | Status: DC

## 2023-03-28 NOTE — Patient Instructions (Addendum)
 Medication Instructions:  Your physician has recommended you make the following change in your medication:  Stop taking Metoprolol   Start taking Lasix  20 mg once daily Start taking Bisoprolol  2.5 mg once daily Continue taking all other medications as prescribed  Labwork: BMP in one week at [facility] before follow up appointment  Testing/Procedures: None  Follow-Up: Your physician recommends that you schedule a follow-up appointment in: 1 week / 6 months  Any Other Special Instructions Will Be Listed Below (If Applicable).  Thank you for choosing [facility]!      If you need a refill on your cardiac medications before your next appointment, please call your pharmacy.

## 2023-03-28 NOTE — Progress Notes (Signed)
 Cardiology Office Note  Date: 03/28/2023   ID: Brent Ray., DOB 06/12/1945, MRN [id number]  PCP:  Brent Mcardle, MD  Cardiologist:  Brent Pointer, MD Electrophysiologist:  Brent Grange, MD   History of Present Illness: Brent Ray. is a 78 y.o. 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  I reviewed the records from [facility] and updated PMH as stated above. Patient was diagnosed with dilated cardiomyopathy based on TTE from 2022 that showed LVEF 30 to 35%, abnormal septal motion consistent with paced rhythm, at least moderate MR/TR, RV is hypokinetic. He underwent R/LHC in 2022 that showed calcified proximal vessels, 20% LCx, 20% OM1, 50% pRCA (with negative FFR), LVEDP 7, PCWP 14, PA 24/15/19, RV 21/6, RA 6, CO 5.9, CI 3.5.  He had been pacemaker dual-chamber in 2017 for unclear reason that was upgraded to CRT-D in 2023 due to drop in LVEF, RV lead extraction was performed.  He underwent multiple ablations for A-fib and based on his device interrogations, he continued to be in A-fib.  He did not tolerate spironolactone , had gynecomastia, spironolactone  was switched to eplerenone.  Eventually, patient stopped taking all his medications as he did not tolerate meds and was having SOB with bilateral lower extremity swelling when he was taking his medications.  His BMI is 19.25, very low.  Patient is here, accompanied by wife.  He was seen by EP recently on 03/12/2023 and underwent device interrogation.  Findings are consistent with possible fluid accumulation of the last several days.  He was  also noted to have 2 episodes of NSVT, less than 20 beats.  He was started on metoprolol  by EP.  He did not tolerated the medication and self discontinued.  Patient also reported having DOE for the last couple of months and productive cough of whitish sputum in the last couple of days.  He also does have some runny nose.  He was having hematochezia and was seen by GI physician in [facility].  His gastroenterologist was worried about his heart condition and hence wants him to be seen by cardiology sooner.  He denies having dizziness, syncope, leg swelling.  Past Medical History:  Diagnosis Date   A-fib [facility])    AICD (automatic cardioverter/defibrillator) present    Barrett's esophagus 2011   In [place] . Records in Epic   CHF (congestive heart failure) (HCC)    Chronic kidney disease    Coronary atherosclerosis of native coronary artery    Nonobstructive   Essential hypertension, benign    GERD (gastroesophageal reflux disease)    Heart block AV first degree    Chronic   History of kidney stones    Hypercholesterolemia    Mixed hyperlipidemia    NSVT (nonsustained ventricular tachycardia) (HCC)    Holter monitor, 2011; recommended RF ablation by Dr. Nunzio Belch - never required   PAF (paroxysmal atrial fibrillation) [facility])    Recently documented in [facility] June 2016   Pre-diabetes    Rheumatoid arthritis (HCC)    Sinus bradycardia    Symptomatic, acebutolol discontinued   Skin cancer  Sleep apnea    pt denies    Past Surgical History:  Procedure Laterality Date   CARDIAC CATHETERIZATION N/A 12/13/2015   Procedure: Left Heart Cath and Coronary Angiography;  Surgeon: Avanell Leigh, MD;  Location[facility] INVASIVE CV LAB;  Service: Cardiovascular;  Laterality: N/A;   CARDIOVERSION N/A 04/11/2016   Procedure: CARDIOVERSION;  Surgeon: Maudine Sos, MD;  Location: [facility] ENDOSCOPY;  Service: Cardiovascular;  Laterality: N/A;   COLONOSCOPY  03/13/2012   [place]--Dr.  Spainhour   EP IMPLANTABLE DEVICE N/A 02/01/2016   Procedure: Pacemaker Implant;  Surgeon: Jolly Needle, MD;  Location: [facility] INVASIVE CV LAB;  Service: Cardiovascular;  Laterality: N/A;   ESOPHAGOGASTRODUODENOSCOPY  03/13/2012   [place], [place]--Dr. Spainhour   HERNIA REPAIR     2   KIDNEY STONE SURGERY     LEFT HEART CATHETERIZATION WITH CORONARY ANGIOGRAM N/A 08/20/2012   Procedure: LEFT HEART CATHETERIZATION WITH CORONARY ANGIOGRAM;  Surgeon: Brent Grates, MD;  Location: [facility] CATH LAB;  Service: Cardiovascular;  Laterality: N/A;   Left leg surgery  03/13/1977   SKIN CANCER EXCISION     TOTAL HIP ARTHROPLASTY Left 01/11/2022   Procedure: TOTAL HIP ARTHROPLASTY ANTERIOR APPROACH;  Surgeon: Brent Hoose, MD;  Location: [facility] ORS;  Service: Orthopedics;  Laterality: Left;  150    Current Outpatient Medications  Medication Sig Dispense Refill   acetaminophen  (TYLENOL ) 325 MG tablet Take 650 mg by mouth daily as needed for headache.     bisoprolol  (ZEBETA ) 5 MG tablet Take 0.5 tablets (2.5 mg total) by mouth daily. 45 tablet 1   cephALEXin (KEFLEX) 500 MG capsule Take 500 mg by mouth.     Cholecalciferol 125 MCG (5000 UT) TABS      cyanocobalamin  2000 MCG tablet Take 2,000 mcg by mouth daily.     folic acid (FOLVITE) 1 MG tablet Take 1 mg by mouth daily.     furosemide  (LASIX ) 20 MG tablet Take 1 tablet (20 mg total) by mouth daily. 30 tablet 3   ipratropium (ATROVENT) 0.06 % nasal spray Place 1 spray into both nostrils daily as needed for rhinitis.     Iron, Ferrous Sulfate, 325 (65 Fe) MG TABS 325 mg by oral route.     methotrexate (RHEUMATREX) 2.5 MG tablet Take 10 mg by mouth once a week.     NITROSTAT  0.4 MG SL tablet DISSOLVE 1 TABLET UNDER TONGUE EVERY 5 MINUTES AS NEEDED FOR CHEST PAIN (Patient taking differently: Place 0.4 mg under the tongue every 5 (five) minutes as needed for chest pain.) 25 tablet 3   Omega-3 1400 MG CAPS Take by oral route.     ondansetron  (ZOFRAN ) 4 MG tablet Take  4 mg by mouth every 8 (eight) hours as needed for nausea.     pantoprazole  (PROTONIX ) 40 MG tablet Take 40 mg by mouth every other day.     potassium chloride  SA (KLOR-CON  M) 20 MEQ tablet Take 20 mEq by mouth every other day.     Rivaroxaban  (XARELTO ) 15 MG TABS tablet Take 15 mg by mouth daily in the afternoon.     rosuvastatin (CRESTOR) 5 MG tablet Take 5 mg by mouth 2 (two) times a week.     traMADol (ULTRAM) 50 MG tablet Take 50 mg by mouth daily as needed for moderate pain.     No current facility-administered medications for this visit.   Allergies:  Amiodarone, Flecainide , Dapagliflozin, Flomax [tamsulosin hcl], Metoprolol , Carvedilol , Eplerenone, Lisinopril , Sacubitril-valsartan, Sulfasalazine, Spironolactone , and Statins  Social History: The patient  reports that he has never smoked. He has never used smokeless tobacco. He reports current alcohol  use of about 2.0 standard drinks of alcohol  per week. He reports that he does not use drugs.   Family History: The patient's family history includes Colon cancer in his mother; Other in his sister, sister, sister, and son.   ROS:  Please see the history of present illness. Otherwise, complete review of systems is positive for none.  All other systems are reviewed and negative.   Physical Exam: VS:  BP 118/72   Pulse (!) 52   Ht 5\' 8"  (1.727 m)   Wt 131 lb 9.6 oz (59.7 kg)   SpO2 96%   BMI 20.01 kg/m , BMI Body mass index is 20.01 kg/m.  Wt Readings from Last 3 Encounters:  03/28/23 131 lb 9.6 oz (59.7 kg)  03/12/23 130 lb 3.2 oz (59.1 kg)  01/15/23 126 lb 9.6 oz (57.4 kg)    General: Patient appears comfortable at rest. HEENT: Conjunctiva and lids normal, oropharynx clear with moist mucosa. Neck: Supple, no elevated JVP or carotid bruits, no thyromegaly. Lungs: Clear to auscultation, nonlabored breathing at rest. Cardiac: Regular rate and rhythm, no S3 or significant systolic murmur, no pericardial rub. Abdomen: Soft,  nontender, no hepatomegaly, bowel sounds present, no guarding or rebound. Extremities: No pitting edema, distal pulses 2+. Skin: Warm and dry. Musculoskeletal: No kyphosis. Neuropsychiatric: Alert and oriented x3, affect grossly appropriate.  Recent Labwork: No results found for requested labs within last 365 days.     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 Device interrogation from 03/12/2023 consistent with possible fluid accumulation over the last several days. 2 episodes of NSVT noted less than 20 beats.  Start p.o. Lasix  20 mg once daily, obtain BMP in 5 days.  He did not tolerate metoprolol , had severe fatigue, will switch metoprolol  to bisoprolol  2.5 mg once daily.  He is made aware to stop bisoprolol  if he does not tolerate.  Okay to start beta-blocker with HR 50s, he has a device in place. He will need to be compensated prior to undergoing any procedure.  He will be brought back in 1 week with APP/MD for preop recommendations prior to undergoing colonoscopy.  Echocardiogram from 11/24 showed LVEF 30 to 35% and moderate MR.  Follows up with EP for device checks.  Continue Xarelto  15 mg once daily with supper due to A-fib.    Patient will need to  call the cardiology clinic and provide us  with the fax number of his GI doctor.   Medication Adjustments/Labs and Tests Ordered: Current medicines are reviewed at length with the patient today.  Concerns regarding medicines are outlined above.    Disposition:  Follow  up 6 months with me and 1 week to 10 days with APP/MD for preop clearance  Signed, Yancarlos Berthold Beauford Bounds, MD, 03/28/2023 11:28 AM    [facility] at [facility] [address] [place], [place] [address]

## 2023-03-30 ENCOUNTER — Ambulatory Visit: Payer: Medicare Other | Admitting: Internal Medicine

## 2023-03-31 LAB — BASIC METABOLIC PANEL
BUN/Creatinine Ratio: 12 (ref 10–24)
BUN: 28 mg/dL — ABNORMAL HIGH (ref 8–27)
CO2: 22 mmol/L (ref 20–29)
Calcium: 10.8 mg/dL — ABNORMAL HIGH (ref 8.6–10.2)
Chloride: 99 mmol/L (ref 96–106)
Creatinine, Ser: 2.35 mg/dL — ABNORMAL HIGH (ref 0.76–1.27)
Glucose: 90 mg/dL (ref 70–99)
Potassium: 4.3 mmol/L (ref 3.5–5.2)
Sodium: 139 mmol/L (ref 134–144)
eGFR: 28 mL/min/{1.73_m2} — ABNORMAL LOW (ref >59)

## 2023-04-02 ENCOUNTER — Encounter: Payer: Self-pay | Admitting: Internal Medicine

## 2023-04-02 ENCOUNTER — Ambulatory Visit: Payer: Medicare Other | Attending: Internal Medicine | Admitting: Internal Medicine

## 2023-04-02 ENCOUNTER — Other Ambulatory Visit: Payer: Self-pay

## 2023-04-02 ENCOUNTER — Telehealth: Payer: Self-pay | Admitting: Internal Medicine

## 2023-04-02 VITALS — BP 118/62 | HR 50 | Ht 68.0 in | Wt 125.0 lb

## 2023-04-02 DIAGNOSIS — N179 Acute kidney failure, unspecified: Secondary | ICD-10-CM | POA: Insufficient documentation

## 2023-04-02 DIAGNOSIS — Z01810 Encounter for preprocedural cardiovascular examination: Secondary | ICD-10-CM | POA: Insufficient documentation

## 2023-04-02 DIAGNOSIS — Z79899 Other long term (current) drug therapy: Secondary | ICD-10-CM

## 2023-04-02 MED ORDER — MIDODRINE HCL 5 MG PO TABS: 5.0000 mg | ORAL_TABLET | Freq: Two times a day (BID) | ORAL | 0 refills | Status: DC

## 2023-04-02 MED ORDER — BISOPROLOL FUMARATE 5 MG PO TABS: 5.0000 mg | ORAL_TABLET | Freq: Every day | ORAL | 11 refills | Status: DC

## 2023-04-02 NOTE — Patient Instructions (Signed)
Medication Instructions:  Your physician has recommended you make the following change in your medication:  -Increase Bisoprolol to 5 mg once daily -Start Midodrine 5 mg twice daily ONLY AS NEEDED FOR DIZZINESS  *If you need a refill on your cardiac medications before your next appointment, please call your pharmacy*   Lab Work: NOne If you have labs (blood work) drawn today and your tests are completely normal, you will receive your results only by: MyChart Message (if you have MyChart) OR A paper copy in the mail If you have any lab test that is abnormal or we need to change your treatment, we will call you to review the results.   Testing/Procedures: None   Follow-Up: At [facility], you and your health needs are our priority.  As part of our continuing mission to provide you with exceptional heart care, we have created designated Provider Care Teams.  These Care Teams include your primary Cardiologist (physician) and Advanced Practice Providers (APPs -  Physician Assistants and Nurse Practitioners) who all work together to provide you with the care you need, when you need it.  We recommend signing up for the patient portal called "MyChart".  Sign up information is provided on this After Visit Summary.  MyChart is used to connect with patients for Virtual Visits (Telemedicine).  Patients are able to view lab/test results, encounter notes, upcoming appointments, etc.  Non-urgent messages can be sent to your provider as well.   To learn more about what you can do with MyChart, go to [email or web address].    Your next appointment:   4 month(s)  Provider:   You may see Vishnu P Mallipeddi, MD or one of the following Advanced Practice Providers on your designated Care Team:   [place], PA-C  Jacolyn Reedy, [place]     Other Instructions

## 2023-04-02 NOTE — Telephone Encounter (Signed)
Patient with diagnosis of afib on Xarelto for anticoagulation.    Procedure: colonoscopy Date of procedure: TBD   CHA2DS2-VASc Score = 5   This indicates a 7.2% annual risk of stroke. The patient's score is based upon: CHF History: 1 HTN History: 1 Diabetes History: 0 Stroke History: 0 Vascular Disease History: 1 Age Score: 2 Gender Score: 0      CrCl 22 mL/min Platelet count 267 K    Per office protocol, patient can hold Xarelto for 2 days prior to procedure.     **This guidance is not considered finalized until pre-operative APP has relayed final recommendations.**

## 2023-04-02 NOTE — Telephone Encounter (Signed)
   Pre-operative Risk Assessment    Patient Name: Brent Ray.  DOB: 1945/07/01 MRN: [id number]   Date of last office visit: 04/02/23 Date of next office visit: 07/30/23   Request for Surgical Clearance    Procedure:   colonoscopy   Date of Surgery:  Clearance TBD                                Surgeon:  Dr. Henderson [place] Group or Practice Name:  [facility] Phone number:  [phone] Fax number:  [phone]   Type of Clearance Requested:   - Medical  - Pharmacy:  Hold Rivaroxaban (Xarelto)     Type of Anesthesia:  Local    Additional requests/questions:      SignedFilomena Jungling   04/02/2023, 2:56 PM

## 2023-04-02 NOTE — Progress Notes (Addendum)
Cardiology Office Note  Date: 04/02/2023   ID: [name], DOB 1945-12-20, MRN [id number]  PCP:  Romeo Rabon, MD  Cardiologist:  Marjo Bicker, MD Electrophysiologist:  Maurice Small, MD   History of Present Illness: Brent Ray. is a 78 y.o. 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  I reviewed the records from [facility] and updated PMH as stated above. Patient was diagnosed with dilated cardiomyopathy based on TTE from 2022 that showed LVEF 30 to 35%, abnormal septal motion consistent with paced rhythm, at least moderate MR/TR, RV is hypokinetic. He underwent R/LHC in 2022 that showed calcified proximal vessels, 20% LCx, 20% OM1, 50% pRCA (with negative FFR), LVEDP 7, PCWP 14, PA 24/15/19, RV 21/6, RA 6, CO 5.9, CI 3.5.  He had been pacemaker dual-chamber in 2017 for unclear reason that was upgraded to CRT-D in 2023 due to drop in LVEF, RV lead extraction was performed.  He underwent multiple ablations for A-fib and based on his device interrogations, he continued to be in A-fib.  He did not tolerate spironolactone, had gynecomastia, spironolactone was switched to eplerenone.  Eventually, patient stopped taking all his medications as he did not tolerate meds and was having SOB with bilateral lower extremity swelling when he was taking his medications.  His BMI is 19.25, very low.  Patient is here, accompanied by wife.  He was seen by EP recently on 03/12/2023 and underwent device interrogation.  Findings are consistent with possible fluid accumulation of the last several days.  He was  also noted to have 2 episodes of NSVT, less than 20 beats.  EKG showed V paced rhythm and frequent PVCs.  He was started on metoprolol by EP.  He did not tolerate the medication and self discontinued.  He was started on bisoprolol 2.5 mg once daily in the last clinic visit which he is tolerating well.  He continues to have fatigue but this fatigue was present even before metoprolol was started.  He probably has fatigue from frequent PVCs.  Patient also reported having DOE for the last couple of months and productive cough of whitish sputum in the last couple of days.  He also does have some runny nose.  He was started on p.o. Lasix 20 mg once daily with with mild improvement in his symptoms.  But his serum creatinine was 2.3 around 2 days ago.  His baseline serum creatinine was 1.5 around 1 year ago.  He was having hematochezia and was seen by GI physician in [facility].  He is here for preop cardiac risk recommendations.  Past Medical History:  Diagnosis Date   A-fib [facility])    AICD (automatic cardioverter/defibrillator) present    Barrett's esophagus 2011   In [place]. Records in Epic   CHF (congestive heart failure) (HCC)    Chronic kidney disease    Coronary atherosclerosis of native coronary artery    Nonobstructive   Essential hypertension, benign    GERD (gastroesophageal reflux disease)    Heart block AV first degree    Chronic   History of kidney stones  Hypercholesterolemia    Mixed hyperlipidemia    NSVT (nonsustained ventricular tachycardia) (HCC)    Holter monitor, 2011; recommended RF ablation by Dr. Johney Frame - never required   PAF (paroxysmal atrial fibrillation) [facility])    Recently documented in [facility] June 2016   Pre-diabetes    Rheumatoid arthritis (HCC)    Sinus bradycardia    Symptomatic, acebutolol discontinued   Skin cancer    Sleep apnea    pt denies    Past Surgical History:  Procedure Laterality Date   CARDIAC CATHETERIZATION N/A 12/13/2015    Procedure: Left Heart Cath and Coronary Angiography;  Surgeon: Runell Gess, MD;  Location: [facility] INVASIVE CV LAB;  Service: Cardiovascular;  Laterality: N/A;   CARDIOVERSION N/A 04/11/2016   Procedure: CARDIOVERSION;  Surgeon: Chilton Si, MD;  Location: [facility] ENDOSCOPY;  Service: Cardiovascular;  Laterality: N/A;   COLONOSCOPY  03/13/2012   [place]--Dr. Spainhour   EP IMPLANTABLE DEVICE N/A 02/01/2016   Procedure: Pacemaker Implant;  Surgeon: Hillis Range, MD;  Location: [facility] INVASIVE CV LAB;  Service: Cardiovascular;  Laterality: N/A;   ESOPHAGOGASTRODUODENOSCOPY  03/13/2012   [place], [place]--Dr. Spainhour   HERNIA REPAIR     2   KIDNEY STONE SURGERY     LEFT HEART CATHETERIZATION WITH CORONARY ANGIOGRAM N/A 08/20/2012   Procedure: LEFT HEART CATHETERIZATION WITH CORONARY ANGIOGRAM;  Surgeon: [name], MD;  Location: [facility] CATH LAB;  Service: Cardiovascular;  Laterality: N/A;   Left leg surgery  03/13/1977   SKIN CANCER EXCISION     TOTAL HIP ARTHROPLASTY Left 01/11/2022   Procedure: TOTAL HIP ARTHROPLASTY ANTERIOR APPROACH;  Surgeon: Samson Frederic, MD;  Location: [facility] ORS;  Service: Orthopedics;  Laterality: Left;  150    Current Outpatient Medications  Medication Sig Dispense Refill   acetaminophen (TYLENOL) 325 MG tablet Take 650 mg by mouth daily as needed for headache.     bisoprolol (ZEBETA) 5 MG tablet Take 1 tablet (5 mg total) by mouth daily. 30 tablet 11   cephALEXin (KEFLEX) 500 MG capsule Take 500 mg by mouth.     Cholecalciferol 125 MCG (5000 UT) TABS      cyanocobalamin 2000 MCG tablet Take 2,000 mcg by mouth daily.     folic acid (FOLVITE) 1 MG tablet Take 1 mg by mouth daily.     furosemide (LASIX) 20 MG tablet Take 1 tablet (20 mg total) by mouth daily. 30 tablet 3   ipratropium (ATROVENT) 0.06 % nasal spray Place 1 spray into both nostrils daily as needed for rhinitis.     Iron, Ferrous Sulfate, 325 (65 Fe) MG TABS 325 mg by oral route.     methotrexate  (RHEUMATREX) 2.5 MG tablet Take 10 mg by mouth once a week.     midodrine (PROAMATINE) 5 MG tablet Take 1 tablet (5 mg total) by mouth 2 (two) times daily with a meal. 14 tablet 0   NITROSTAT 0.4 MG SL tablet DISSOLVE 1 TABLET UNDER TONGUE EVERY 5 MINUTES AS NEEDED FOR CHEST PAIN (Patient taking differently: Place 0.4 mg under the tongue every 5 (five) minutes as needed for chest pain.) 25 tablet 3   Omega-3 1400 MG CAPS Take by oral route.     ondansetron (ZOFRAN) 4 MG tablet Take 4 mg by mouth every 8 (eight) hours as needed for nausea.     pantoprazole (PROTONIX) 40 MG tablet Take 40 mg by mouth every other day.     potassium chloride SA (KLOR-CON M) 20 MEQ tablet Take  20 mEq by mouth every other day.     Rivaroxaban (XARELTO) 15 MG TABS tablet Take 15 mg by mouth daily in the afternoon.     rosuvastatin (CRESTOR) 5 MG tablet Take 5 mg by mouth 2 (two) times a week.     traMADol (ULTRAM) 50 MG tablet Take 50 mg by mouth daily as needed for moderate pain.     No current facility-administered medications for this visit.   Allergies:  Amiodarone, Flecainide, Dapagliflozin, Flomax [tamsulosin hcl], Metoprolol, Carvedilol, Eplerenone, Lisinopril, Sacubitril-valsartan, Sulfasalazine, Spironolactone, and Statins   Social History: The patient  reports that he has never smoked. He has never used smokeless tobacco. He reports current alcohol use of about 2.0 standard drinks of alcohol per week. He reports that he does not use drugs.   Family History: The patient's family history includes Colon cancer in his mother; Other in his sister, sister, sister, and son.   ROS:  Please see the history of present illness. Otherwise, complete review of systems is positive for none.  All other systems are reviewed and negative.   Physical Exam: VS:  BP 118/62   Pulse (!) 50   Ht 5\' 8"  (1.727 m)   Wt 125 lb (56.7 kg)   SpO2 90%   BMI 19.01 kg/m , BMI Body mass index is 19.01 kg/m.  Wt Readings from Last 3  Encounters:  04/02/23 125 lb (56.7 kg)  03/28/23 131 lb 9.6 oz (59.7 kg)  03/12/23 130 lb 3.2 oz (59.1 kg)    General: Patient appears comfortable at rest. HEENT: Conjunctiva and lids normal, oropharynx clear with moist mucosa. Neck: Supple, no elevated JVP or carotid bruits, no thyromegaly. Lungs: Clear to auscultation, nonlabored breathing at rest. Cardiac: Regular rate and rhythm, no S3 or significant systolic murmur, no pericardial rub. Abdomen: Soft, nontender, no hepatomegaly, bowel sounds present, no guarding or rebound. Extremities: No pitting edema, distal pulses 2+. Skin: Warm and dry. Musculoskeletal: No kyphosis. Neuropsychiatric: Alert and oriented x3, affect grossly appropriate.  Recent Labwork: 03/30/2023: BUN 28; Creatinine, Ser 2.35; Potassium 4.3; Sodium 139     Component Value Date/Time   CHOL 102 12/14/2015 0154   TRIG 66 12/14/2015 0154   HDL 42 12/14/2015 0154   CHOLHDL 2.4 12/14/2015 0154   VLDL 13 12/14/2015 0154   LDLCALC 47 12/14/2015 0154     Assessment and Plan:   Preop cardiac risk stratification for colonoscopy Acute on chronic systolic heart failure, near compensated Acute kidney injury (Serum creatinine 2.3 on p.o. 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 Device interrogation from 03/12/2023 consistent with possible fluid accumulation over the last several days. 2 episodes of NSVT noted less than 20 beats.  EKG showed V paced rhythm and frequent PVCs. [employer] rep interrogated the device (not  officially read) that showed frequent PVCs on live interrogation which is artifactually recording the HR in 50s even though his device HR is set at 70 bpm.  His serum creatinine 2 days ago was 2.3 (significantly elevated from 1.5 around 1 year ago), will hold p.o. Lasix  for now.  Will repeat BMP in 2 weeks.  Due to frequent PVCs and recent NSVT, he was initially started on metoprolol, did not tolerate and hence had to be switched to bisoprolol 2.5 mg once daily.  He reported mild improvement since the last clinic visit around 5 days ago.  He is near compensated. Will increase the bisoprolol dose from 2.5 mg to 5 mg once daily due to frequent PVCs on interrogation today. I will also provide him with a 7-day supply of midodrine 5 mg twice daily. if his BP drops and feels symptomatic with dizziness, I instructed him to start taking midodrine along with bisoprolol 5 mg dose. Patient is at a moderate risk for any perioperative cardiac complications if he undergoes colonoscopy which is a low risk procedure. Hold Xarelto for 3 days prior to the procedure and resume when safe from bleeding standpoint. -Echocardiogram from 11/24 showed LVEF 30 to 35% and moderate MR.  Follows up with EP for device checks.  Continue Xarelto 15 mg once daily with supper due to A-fib.   Will fax the office note to his gastroenterologist office.   Medication Adjustments/Labs and Tests Ordered: Current medicines are reviewed at length with the patient today.  Concerns regarding medicines are outlined above.    Disposition:  Follow up 3 months  Signed, Navina Wohlers Verne Spurr, MD, 04/02/2023 3:21 PM    [facility] at [facility] [address], [place], [place] [address]

## 2023-04-03 NOTE — Telephone Encounter (Signed)
   Patient Name: Brent Ray.  DOB: 1945/12/16 MRN: [id number]  Primary Cardiologist: Marjo Bicker, MD  Chart reviewed as part of pre-operative protocol coverage. Pt was seen in office on 04/02/2023 by Dr. Jenene Slicker. Given past medical history and time since last visit, based on ACC/AHA guidelines, Stancil Coelho. is at acceptable risk for the planned procedure without further cardiovascular testing.   Per office protocol, patient can hold Xarelto for 2 days prior to procedure. Please resume Xarelto as soon as possible postprocedure, at the discretion of the surgeon.   I will route this recommendation as well as the note from most recent office visit on 04/02/2023 to the requesting party via Epic fax function and remove from pre-op pool.  Please call with questions.  Joylene Grapes, NP 04/03/2023, 8:46 AM

## 2023-04-17 ENCOUNTER — Ambulatory Visit: Payer: Medicare Other | Admitting: Internal Medicine

## 2023-04-17 ENCOUNTER — Ambulatory Visit: Payer: Medicare PPO | Admitting: Internal Medicine

## 2023-05-01 ENCOUNTER — Encounter: Payer: Self-pay | Admitting: Internal Medicine

## 2023-06-13 ENCOUNTER — Encounter: Payer: Self-pay | Admitting: *Deleted

## 2023-06-15 ENCOUNTER — Ambulatory Visit: Payer: Medicare PPO | Attending: Cardiovascular Disease | Admitting: Cardiovascular Disease

## 2023-06-15 VITALS — BP 120/70 | HR 72 | Ht 68.0 in | Wt 130.6 lb

## 2023-06-15 DIAGNOSIS — I4892 Unspecified atrial flutter: Secondary | ICD-10-CM

## 2023-06-15 DIAGNOSIS — I493 Ventricular premature depolarization: Secondary | ICD-10-CM

## 2023-06-15 MED ORDER — MEXILETINE HCL 150 MG PO CAPS: 150.0000 mg | ORAL_CAPSULE | Freq: Three times a day (TID) | ORAL | 3 refills | Status: DC

## 2023-06-15 NOTE — Patient Instructions (Addendum)
 Medication Instructions:  Continue all current medications.  Labwork: none  Testing/Procedures: none  Follow-Up: 3 months   Any Other Special Instructions Will Be Listed Below (If Applicable).  If you need a refill on your cardiac medications before your next appointment, please call your pharmacy.

## 2023-06-15 NOTE — Progress Notes (Signed)
  Electrophysiology Office Note:    Date:  06/15/2023   ID:  Brent Shorts., DOB April 09, 1945, MRN [id number]  PCP:  Aggie Cosier, MD   [facility] Providers Cardiologist:  Marjo Bicker, MD Electrophysiologist:  Maurice Small, MD     Referring MD: Romeo Rabon, MD   History of Present Illness:    Brent Edelman. is a 78 y.o. male with a medical history significant for nonischemic cardiomyopathy LVEF 25 to 30%, permanent atrial fibrillation s/p multiple interventions, severe TR and moderate MR referred for device management.     The patient had a dual chamber pacemaker placed in 2017. This was upgraded to a CRT defibrillator in 2023 following the development of cardiomyopathy. He has a history of atrial fibrillation and underwent pulmonary vein isolation in 2018, followed by another ablation with mitral flutter line in 2019. He has had an alcohol ablation of the VOM.  He has not well tolerated GDMT in the past.  He was on carvedilol and eplerenone but had progression in shortness of breath and dependent edema until he discontinued these medications --carvedilol in February and eplerenone in May 2024.  He reports that his shortness of breath, fatigue, edema improved abruptly.  This correlates with the improvement in his OptiVol status on his device as well.  At our prior visit, I started a small dose of metoprolol.  This was discontinued and he was started on metoprolol.      Today, he reports that he has baseline.  He has decent energy levels, no dependent edema.  EKGs/Labs/Other Studies Reviewed Today:     Echocardiogram:  TTE November 2024 EF 30 to 35%.  Mildly dilated left atrium.  Moderate MR.    EKG:         Physical Exam:    VS:  Ht 5\' 8"  (1.727 m)   Wt 130 lb 9.6 oz (59.2 kg)   BMI 19.86 kg/m     Wt Readings from Last 3 Encounters:  06/15/23 130 lb 9.6 oz (59.2 kg)  04/02/23 125 lb (56.7 kg)  03/28/23 131 lb 9.6 oz (59.7 kg)      GEN: Well nourished, well developed in no acute distress CARDIAC: RRR, no murmurs, rubs, gallops The device site is normal -- no tenderness, edema, drainage, redness, threatened erosion.  RESPIRATORY:  Normal work of breathing MUSCULOSKELETAL: no edema    ASSESSMENT & PLAN:     Nonischemic cardiomyopathy EF 30 to 35% He did not tolerate GDMT in the past and discontinued his medications CRT-D in place, functioning normally I reviewed today's device interrogation.  See Paceart for details. Continue bisoprolol, increase the dose as tolerated  History of symptomatic bradycardia, heart block Original dual-chamber pacemaker placed in 2017 has been replaced with a CRT-D device in April, 2023  Frequent PVCs BiV pacing is 79% GDMT limited by mediation intolerances Will try mexilitine at lowest dose -- 150mg  PO BID  Permanent atrial fibrillation Status post multiple ablations including vein of Marshall alcohol ablation.  Signed, Maurice Small, MD  06/15/2023 2:38 PM    [facility]

## 2023-07-26 ENCOUNTER — Other Ambulatory Visit: Payer: Self-pay | Admitting: Internal Medicine

## 2023-07-30 ENCOUNTER — Ambulatory Visit: Payer: Medicare Other | Attending: Internal Medicine | Admitting: Internal Medicine

## 2023-07-30 VITALS — BP 130/82 | HR 72 | Ht 68.0 in | Wt 120.4 lb

## 2023-07-30 DIAGNOSIS — I5022 Chronic systolic (congestive) heart failure: Secondary | ICD-10-CM | POA: Diagnosis not present

## 2023-07-30 NOTE — Progress Notes (Signed)
 Cardiology Office Note  Date: 07/30/2023   ID: Brent Ray., DOB [birth year], MRN [id number]  PCP:  Tressie Fryer, MD  Cardiologist:  Lasalle Pointer, MD Electrophysiologist:  Efraim Grange, MD   History of Present Illness: Brent Ray. is a 78 y.o. 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  I reviewed the records from [facility] and updated PMH as stated above. Patient was diagnosed with dilated cardiomyopathy based on TTE from 2022 that showed LVEF 30 to 35%, abnormal septal motion consistent with paced rhythm, at least moderate MR/TR, RV is hypokinetic. He underwent R/LHC in 2022 that showed calcified proximal vessels, 20% LCx, 20% OM1, 50% pRCA (with negative FFR), LVEDP 7, PCWP 14, PA 24/15/19, RV 21/6, RA 6, CO 5.9, CI 3.5.  He had been pacemaker dual-chamber in 2017 for unclear reason that was upgraded to CRT-D in 2023 due to drop in LVEF, RV lead extraction was performed.  He underwent multiple ablations for A-fib and based on his device interrogations, he continued to be in A-fib.  He did not tolerate spironolactone , had gynecomastia, spironolactone  was switched to eplerenone.  Eventually, patient stopped taking all his medications as he did not tolerate meds and was having SOB with bilateral lower extremity swelling when he was taking his medications.  His BMI is 19.25, very low.  Patient is here, accompanied by wife.  Jan 2025: He was seen by EP recently on 03/12/2023 and underwent device interrogation.  Findings are consistent with possible fluid accumulation of the last several days.   He was also noted to have 2 episodes of NSVT, less than 20 beats.  EKG showed V paced rhythm and frequent PVCs.  He was started on metoprolol  by EP.  He did not tolerate the medication and self discontinued.  He was started on bisoprolol  2.5 mg once daily in the last clinic visit which he is tolerating well.  He continues to have fatigue but this fatigue was present even before metoprolol  was started.  He probably has fatigue from frequent PVCs.  Patient also reported having DOE for the last couple of months and productive cough of whitish sputum in the last couple of days.  He also does have some runny nose.  He was started on p.o. Lasix  20 mg once daily with with mild improvement in his symptoms.  But his serum creatinine was 2.3 around 2 days ago.  His baseline serum creatinine was 1.5 around 1 year ago.  He was having hematochezia and was seen by GI physician in [facility].     May 2025: His DOE was worsening in the last 4 to 6 weeks with some leg swelling and apnea.  No PND.  No angina, dizziness, syncope.  He has nosebleeds daily.  He also has been bleeding from his hemorrhoids.  I reviewed his recent blood work that showed proBNP 31,000, chloride 110, bicarb 18 and serum creatinine 1.9.  He has bilateral arm bruising from Plavix.  He is interested to undergo Watchman procedure and A-fib ablation.  He follows up with Dr. [name], electrophysiology.   Past  Medical History:  Diagnosis Date   A-fib [facility])    AICD (automatic cardioverter/defibrillator) present    Barrett's esophagus 2011   In [place] . Records in Epic   CHF (congestive heart failure) (HCC)    Chronic kidney disease    Coronary atherosclerosis of native coronary artery    Nonobstructive   Essential hypertension, benign    GERD (gastroesophageal reflux disease)    Heart block AV first degree    Chronic   History of kidney stones    Hypercholesterolemia    Mixed hyperlipidemia    NSVT (nonsustained ventricular  tachycardia) (HCC)    Holter monitor, 2011; recommended RF ablation by Dr. Nunzio Belch - never required   PAF (paroxysmal atrial fibrillation) [facility])    Recently documented in [facility] June 2016   Pre-diabetes    Rheumatoid arthritis (HCC)    Sinus bradycardia    Symptomatic, acebutolol discontinued   Skin cancer    Sleep apnea    pt denies    Past Surgical History:  Procedure Laterality Date   CARDIAC CATHETERIZATION N/A 12/13/2015   Procedure: Left Heart Cath and Coronary Angiography;  Surgeon: Avanell Leigh, MD;  Location: [facility] INVASIVE CV LAB;  Service: Cardiovascular;  Laterality: N/A;   CARDIOVERSION N/A 04/11/2016   Procedure: CARDIOVERSION;  Surgeon: Maudine Sos, MD;  Location: [facility] ENDOSCOPY;  Service: Cardiovascular;  Laterality: N/A;   COLONOSCOPY  03/13/2012   [place]--Dr. Spainhour   EP IMPLANTABLE DEVICE N/A 02/01/2016   Procedure: Pacemaker Implant;  Surgeon: Jolly Needle, MD;  Location: [facility] INVASIVE CV LAB;  Service: Cardiovascular;  Laterality: N/A;   ESOPHAGOGASTRODUODENOSCOPY  03/13/2012   [place], [place]--Dr. Spainhour   HERNIA REPAIR     2   KIDNEY STONE SURGERY     LEFT HEART CATHETERIZATION WITH CORONARY ANGIOGRAM N/A 08/20/2012   Procedure: LEFT HEART CATHETERIZATION WITH CORONARY ANGIOGRAM;  Surgeon: Eilleen Grates, MD;  Location: [facility] CATH LAB;  Service: Cardiovascular;  Laterality: N/A;   Left leg surgery  03/13/1977   SKIN CANCER EXCISION     TOTAL HIP ARTHROPLASTY Left 01/11/2022   Procedure: TOTAL HIP ARTHROPLASTY ANTERIOR APPROACH;  Surgeon: Adonica Hoose, MD;  Location: [facility] ORS;  Service: Orthopedics;  Laterality: Left;  150    Current Outpatient Medications  Medication Sig Dispense Refill   acetaminophen  (TYLENOL ) 325 MG tablet Take 650 mg by mouth daily as needed for headache.     cephALEXin (KEFLEX) 500 MG capsule Take 500 mg by mouth. Only for dental appointments     folic acid (FOLVITE) 1 MG tablet Take 1 mg by mouth daily. 6 days a week      furosemide  (LASIX ) 20 MG tablet TAKE 1 TABLET(20 MG) BY MOUTH DAILY 30 tablet 3   ipratropium (ATROVENT) 0.06 % nasal spray Place 1 spray into both nostrils daily as needed for rhinitis.     methotrexate (RHEUMATREX) 2.5 MG tablet Take 2.5 mg by mouth once a week. 6 pills weekly     mexiletine (MEXITIL) 150 MG capsule Take 150 mg by mouth 2 (two) times daily.     NITROSTAT  0.4 MG SL tablet DISSOLVE 1 TABLET UNDER TONGUE EVERY 5 MINUTES AS NEEDED FOR CHEST PAIN (Patient taking differently: Place 0.4 mg under the tongue every 5 (five) minutes as needed for chest pain.) 25 tablet 3   ondansetron  (ZOFRAN ) 4 MG tablet Take 4 mg by mouth every 8 (eight) hours as needed for nausea.     pantoprazole  (PROTONIX ) 40 MG tablet Take 40 mg by  mouth every morning.     Probiotic Product (PROBIOTIC DAILY PO) Take by mouth daily.     Rivaroxaban  (XARELTO ) 15 MG TABS tablet Take 15 mg by mouth daily in the afternoon.     rosuvastatin (CRESTOR) 5 MG tablet Take 5 mg by mouth as directed. Takes on Sunday     traMADol (ULTRAM) 50 MG tablet Take 50 mg by mouth daily as needed for moderate pain.     ALPRAZolam  (XANAX ) 0.5 MG tablet Take 1 tablet by mouth 2 (two) times daily as needed.     CRANBERRY PO Take 500 mg by mouth daily.     No current facility-administered medications for this visit.   Allergies:  Amiodarone, Flecainide , Dapagliflozin, Flomax [tamsulosin hcl], Metoprolol , Carvedilol , Eplerenone, Lisinopril , Sacubitril-valsartan, Sulfasalazine, Spironolactone , and Statins   Social History: The patient  reports that he has never smoked. He has never used smokeless tobacco. He reports current alcohol  use of about 2.0 standard drinks of alcohol  per week. He reports that he does not use drugs.   Family History: The patient's family history includes Colon cancer in his mother; Other in his sister, sister, sister, and son.   ROS:  Please see the history of present illness. Otherwise, complete review of systems is  positive for none.  All other systems are reviewed and negative.   Physical Exam: VS:  BP 130/82   Pulse 72   Ht 5\' 8"  (1.727 m)   Wt 120 lb 6.4 oz (54.6 kg)   SpO2 99%   BMI 18.31 kg/m , BMI Body mass index is 18.31 kg/m.  Wt Readings from Last 3 Encounters:  07/30/23 120 lb 6.4 oz (54.6 kg)  06/15/23 130 lb 9.6 oz (59.2 kg)  04/02/23 125 lb (56.7 kg)    General: Patient appears comfortable at rest. HEENT: Conjunctiva and lids normal, oropharynx clear with moist mucosa. Neck: Supple, no elevated JVP or carotid bruits, no thyromegaly. Lungs: Clear to auscultation, nonlabored breathing at rest. Cardiac: Regular rate and rhythm, no S3 or significant systolic murmur, no pericardial rub. Abdomen: Soft, nontender, no hepatomegaly, bowel sounds present, no guarding or rebound. Extremities: No pitting edema, distal pulses 2+. Skin: Warm and dry. Musculoskeletal: No kyphosis. Neuropsychiatric: Alert and oriented x3, affect grossly appropriate.  Recent Labwork: 03/30/2023: BUN 28; Creatinine, Ser 2.35; Potassium 4.3; Sodium 139     Component Value Date/Time   CHOL 102 12/14/2015 0154   TRIG 66 12/14/2015 0154   HDL 42 12/14/2015 0154   CHOLHDL 2.4 12/14/2015 0154   VLDL 13 12/14/2015 0154   LDLCALC 47 12/14/2015 0154     Assessment and Plan:   Acute on chronic systolic heart failure, near compensated: Worsening DOE, orthopnea and leg swelling x 4 to 6 weeks. On p.o. Lasix  20 mg once daily now. I reviewed his recent labs performed on 07/26/2023 that showed, proBNP 31,000, serum creatinine 1.9, chloride 110, bicarb 18.  He has appointment with his GI/surgeon tomorrow for bleeding hemorrhoids. for now, will increase p.o. Lasix  frequency from 20 mg once daily to 3 times daily until tomorrow when he will need to be admitted to [facility] for elective IV diuresis. Anticipate 2 to 3 days of IV diuresis on Lasix  drip 10 mg/h. Did not tolerate metoprolol  and bisoprolol  previously.  Serum creatinine  1.9 on recent labs from 07/26/2023.  NICM LVEF 30-35% s/p CRT-D: Volume overloaded, see plan above.  Did not tolerate metoprolol , carvedilol , bisoprolol  previously.  Did not tolerate lisinopril , Entresto due to nausea vomiting.  Did not tolerate spironolactone  and eplerenone as well.  Added midodrine  in the last clinic visit but he did not tolerate this as well.  Will refer him to advanced heart failure clinic.  Follows up with the EP for device checks.  Permanent A-fib s/p PVI and Marshall alcohol  ablation: He underwent multiple ablations for A-fib and continues to be in A-fib.  He is interested to undergo another ablation for A-fib.  Discussed that there is no further benefit of A-fib ablations at this point.  He will speak with Dr. Arlester Ladd in his next upcoming visit.  He has extensive bruising, nosebleeds and hemorrhoidal bleeding.  He is extremely interested to undergo watchman implantation.  He will need to be volume optimized prior to considering any procedures.  Since this is not urgent, this can be discussed/addressed later.  Continue Xarelto  15 mg daily with supper.  Frequent PVCs: Did not tolerate metoprolol , carvedilol , bisoprolol  previously.  Currently on mexiletine 150 mg twice daily by EP which he seemed to tolerate very well.  Mitral annular flutter s/p ablation: No recurrence of flutter.  Monitor.  Nonobstructive CAD: No angina.  Monitor.  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  Will monitor with serial echocardiograms.  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  On chronic Xarelto .  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   30 minutes spent reviewing prior notes, labs, outside review of labs and interpretation of the labs and answering all the questions with the patient.  Complex decision making.  Medication Adjustments/Labs and Tests Ordered: Current medicines are reviewed at length with the patient today.   Concerns regarding medicines are outlined above.    Disposition:  Follow up 3 months  Signed, Amalio Loe Beauford Bounds, MD, 07/30/2023 11:28 AM    [facility] at [facility] [address], [place], [place] [address]

## 2023-07-30 NOTE — Patient Instructions (Addendum)
 Medication Instructions:  Your physician has recommended you make the following change in your medication:  Please take Lasix  20 mg three times a day for 2 days  Continue taking all other medications as prescribed  Labwork: None  Testing/Procedures: None  Follow-Up: Your physician recommends that you schedule a follow-up appointment in:   Any Other Special Instructions Will Be Listed Below (If Applicable).  Referral to Heart Failure Clinic  Please to [facility] on tomorrow for fluid overload  Thank you for choosing [facility]!     If you need a refill on your cardiac medications before your next appointment, please call your pharmacy.

## 2023-07-31 ENCOUNTER — Other Ambulatory Visit: Payer: Self-pay

## 2023-07-31 ENCOUNTER — Encounter (HOSPITAL_COMMUNITY): Payer: Self-pay | Admitting: *Deleted

## 2023-07-31 ENCOUNTER — Observation Stay (HOSPITAL_COMMUNITY)
Admission: EM | Admit: 2023-07-31 | Discharge: 2023-08-01 | Disposition: A | Discharge status: Home / Self Care | Attending: Family Medicine | Admitting: Family Medicine

## 2023-07-31 ENCOUNTER — Emergency Department (HOSPITAL_COMMUNITY)

## 2023-07-31 DIAGNOSIS — Z85828 Personal history of other malignant neoplasm of skin: Secondary | ICD-10-CM | POA: Insufficient documentation

## 2023-07-31 DIAGNOSIS — D7589 Other specified diseases of blood and blood-forming organs: Secondary | ICD-10-CM | POA: Diagnosis not present

## 2023-07-31 DIAGNOSIS — Z96642 Presence of left artificial hip joint: Secondary | ICD-10-CM | POA: Diagnosis not present

## 2023-07-31 DIAGNOSIS — I509 Heart failure, unspecified: Secondary | ICD-10-CM

## 2023-07-31 DIAGNOSIS — N189 Chronic kidney disease, unspecified: Secondary | ICD-10-CM | POA: Insufficient documentation

## 2023-07-31 DIAGNOSIS — I480 Paroxysmal atrial fibrillation: Secondary | ICD-10-CM | POA: Diagnosis not present

## 2023-07-31 DIAGNOSIS — K649 Unspecified hemorrhoids: Secondary | ICD-10-CM | POA: Insufficient documentation

## 2023-07-31 DIAGNOSIS — E785 Hyperlipidemia, unspecified: Secondary | ICD-10-CM

## 2023-07-31 DIAGNOSIS — K279 Peptic ulcer, site unspecified, unspecified as acute or chronic, without hemorrhage or perforation: Secondary | ICD-10-CM

## 2023-07-31 DIAGNOSIS — I5023 Acute on chronic systolic (congestive) heart failure: Secondary | ICD-10-CM | POA: Insufficient documentation

## 2023-07-31 DIAGNOSIS — Z7901 Long term (current) use of anticoagulants: Secondary | ICD-10-CM | POA: Insufficient documentation

## 2023-07-31 DIAGNOSIS — I429 Cardiomyopathy, unspecified: Secondary | ICD-10-CM | POA: Insufficient documentation

## 2023-07-31 DIAGNOSIS — Z79899 Other long term (current) drug therapy: Secondary | ICD-10-CM | POA: Insufficient documentation

## 2023-07-31 DIAGNOSIS — R0602 Shortness of breath: Secondary | ICD-10-CM | POA: Diagnosis present

## 2023-07-31 DIAGNOSIS — M790 Rheumatism, unspecified: Secondary | ICD-10-CM | POA: Diagnosis not present

## 2023-07-31 DIAGNOSIS — E872 Acidosis, unspecified: Secondary | ICD-10-CM | POA: Diagnosis present

## 2023-07-31 DIAGNOSIS — I2510 Atherosclerotic heart disease of native coronary artery without angina pectoris: Secondary | ICD-10-CM | POA: Insufficient documentation

## 2023-07-31 DIAGNOSIS — I10 Essential (primary) hypertension: Secondary | ICD-10-CM | POA: Diagnosis present

## 2023-07-31 DIAGNOSIS — I428 Other cardiomyopathies: Secondary | ICD-10-CM

## 2023-07-31 DIAGNOSIS — Z95810 Presence of automatic (implantable) cardiac defibrillator: Secondary | ICD-10-CM | POA: Diagnosis not present

## 2023-07-31 LAB — CBC WITH DIFFERENTIAL/PLATELET
Abs Immature Granulocytes: 0.03 10*3/uL (ref 0.00–0.07)
Basophils Absolute: 0 10*3/uL (ref 0.0–0.1)
Basophils Relative: 0 %
Eosinophils Absolute: 0 10*3/uL (ref 0.0–0.5)
Eosinophils Relative: 0 %
HCT: 44.9 % (ref 39.0–52.0)
Hemoglobin: 14.8 g/dL (ref 13.0–17.0)
Immature Granulocytes: 0 %
Lymphocytes Relative: 7 %
Lymphs Abs: 0.5 10*3/uL — ABNORMAL LOW (ref 0.7–4.0)
MCH: 34.1 pg — ABNORMAL HIGH (ref 26.0–34.0)
MCHC: 33 g/dL (ref 30.0–36.0)
MCV: 103.5 fL — ABNORMAL HIGH (ref 80.0–100.0)
Monocytes Absolute: 0.2 10*3/uL (ref 0.1–1.0)
Monocytes Relative: 2 %
Neutro Abs: 6.9 10*3/uL (ref 1.7–7.7)
Neutrophils Relative %: 91 %
Platelets: 183 10*3/uL (ref 150–400)
RBC: 4.34 MIL/uL (ref 4.22–5.81)
RDW: 13.9 % (ref 11.5–15.5)
WBC: 7.7 10*3/uL (ref 4.0–10.5)
nRBC: 0 % (ref 0.0–0.2)

## 2023-07-31 LAB — BASIC METABOLIC PANEL WITH GFR
Anion gap: 13 (ref 5–15)
BUN: 41 mg/dL — ABNORMAL HIGH (ref 8–23)
CO2: 16 mmol/L — ABNORMAL LOW (ref 22–32)
Calcium: 8.5 mg/dL — ABNORMAL LOW (ref 8.9–10.3)
Chloride: 106 mmol/L (ref 98–111)
Creatinine, Ser: 2.11 mg/dL — ABNORMAL HIGH (ref 0.61–1.24)
GFR, Estimated: 32 mL/min — ABNORMAL LOW (ref >60)
Glucose, Bld: 123 mg/dL — ABNORMAL HIGH (ref 70–99)
Potassium: 3.3 mmol/L — ABNORMAL LOW (ref 3.5–5.1)
Sodium: 135 mmol/L (ref 135–145)

## 2023-07-31 LAB — MAGNESIUM: Magnesium: 2.2 mg/dL (ref 1.7–2.4)

## 2023-07-31 LAB — BRAIN NATRIURETIC PEPTIDE: B Natriuretic Peptide: 1699 pg/mL — ABNORMAL HIGH (ref 0.0–100.0)

## 2023-07-31 LAB — TROPONIN I (HIGH SENSITIVITY): Troponin I (High Sensitivity): 19 ng/L — ABNORMAL HIGH (ref <18)

## 2023-07-31 MED ORDER — FOLIC ACID 1 MG PO TABS: 1.0000 mg | ORAL_TABLET | Freq: Every day | ORAL | Status: DC

## 2023-07-31 MED ORDER — HYDRALAZINE HCL 20 MG/ML IJ SOLN: 10.0000 mg | INTRAMUSCULAR | Status: DC | PRN

## 2023-07-31 MED ORDER — POLYETHYLENE GLYCOL 3350 17 G PO PACK: 17.0000 g | PACK | Freq: Every day | ORAL | Status: DC | PRN

## 2023-07-31 MED ORDER — RIVAROXABAN 15 MG PO TABS: 15.0000 mg | ORAL_TABLET | Freq: Every day | ORAL | Status: DC

## 2023-07-31 MED ORDER — METHOTREXATE SODIUM 2.5 MG PO TABS: 2.5000 mg | ORAL_TABLET | ORAL | Status: DC

## 2023-07-31 MED ORDER — MEXILETINE HCL 150 MG PO CAPS
150.0000 mg | ORAL_CAPSULE | Freq: Two times a day (BID) | ORAL | Status: DC
  Administered 2023-07-31 – 2023-08-01 (×2): 150 mg via ORAL
  Filled 2023-07-31 (×4): qty 1

## 2023-07-31 MED ORDER — ROSUVASTATIN CALCIUM 5 MG PO TABS: 5.0000 mg | ORAL_TABLET | ORAL | Status: DC

## 2023-07-31 MED ORDER — FOLIC ACID 1 MG PO TABS
1.0000 mg | ORAL_TABLET | Freq: Every day | ORAL | Status: DC
  Administered 2023-07-31 – 2023-08-01 (×2): 1 mg via ORAL
  Filled 2023-07-31 (×2): qty 1

## 2023-07-31 MED ORDER — ACETAMINOPHEN 650 MG RE SUPP: 650.0000 mg | Freq: Four times a day (QID) | RECTAL | Status: DC | PRN

## 2023-07-31 MED ORDER — POTASSIUM CHLORIDE CRYS ER 20 MEQ PO TBCR
20.0000 meq | EXTENDED_RELEASE_TABLET | Freq: Once | ORAL | Status: AC
  Administered 2023-07-31: 20 meq via ORAL
  Filled 2023-07-31: qty 1

## 2023-07-31 MED ORDER — FUROSEMIDE 40 MG PO TABS: 40.0000 mg | ORAL_TABLET | Freq: Every day | ORAL | Status: DC

## 2023-07-31 MED ORDER — PANTOPRAZOLE SODIUM 40 MG PO TBEC
40.0000 mg | DELAYED_RELEASE_TABLET | Freq: Every day | ORAL | Status: DC
  Administered 2023-07-31: 40 mg via ORAL
  Filled 2023-07-31: qty 1

## 2023-07-31 MED ORDER — LABETALOL HCL 5 MG/ML IV SOLN: 20.0000 mg | INTRAVENOUS | Status: DC | PRN

## 2023-07-31 MED ORDER — SODIUM BICARBONATE 650 MG PO TABS
650.0000 mg | ORAL_TABLET | Freq: Two times a day (BID) | ORAL | Status: DC
  Administered 2023-07-31 – 2023-08-01 (×2): 650 mg via ORAL
  Filled 2023-07-31 (×2): qty 1

## 2023-07-31 MED ORDER — FUROSEMIDE 10 MG/ML IJ SOLN
40.0000 mg | Freq: Once | INTRAMUSCULAR | Status: AC
  Administered 2023-07-31: 40 mg via INTRAVENOUS
  Filled 2023-07-31: qty 4

## 2023-07-31 MED ORDER — ACETAMINOPHEN 325 MG PO TABS: 650.0000 mg | ORAL_TABLET | Freq: Four times a day (QID) | ORAL | Status: DC | PRN

## 2023-07-31 MED ORDER — FUROSEMIDE 10 MG/ML IJ SOLN: 40.0000 mg | Freq: Every day | INTRAMUSCULAR | Status: DC

## 2023-07-31 MED ORDER — SODIUM CHLORIDE 0.9% FLUSH
3.0000 mL | Freq: Two times a day (BID) | INTRAVENOUS | Status: DC
  Administered 2023-07-31 – 2023-08-01 (×2): 3 mL via INTRAVENOUS

## 2023-07-31 NOTE — Assessment & Plan Note (Signed)
 C.w. crestor

## 2023-07-31 NOTE — Assessment & Plan Note (Signed)
 Patient's blood pressure is above target.  Patient declines to be started on standing antihypertensive agents.  I will ordered some as needed IV agents in case patient's systolic blood pressure is over 160.

## 2023-07-31 NOTE — Assessment & Plan Note (Signed)
 Check B12 folate and TSH

## 2023-07-31 NOTE — Assessment & Plan Note (Signed)
 LVEF 25-30% in 04/2022, nonobstructive CAD, s/p dual chamber PPM in 2017 with RV lead extraction and upgrade to CRT-D in 06/2021, permanent Afib s/p PVI in 2018, ablation of Marshall bundle and mitral annular atrial flutter in 2019, Marshall alcohol  ablation in 2021, valvular heart disease (moderate MR and moderate to severe TR),   Patinet has had multiple side effects with goal directed/diseae modifying CHF therapy. C.w. lasix  (change from PRN to standing) esp in light of getting started on sodium bicarbonate PO therapy. Discussed with patinet and wife as above. Furhter optimization by long standing cardiology.

## 2023-07-31 NOTE — Assessment & Plan Note (Signed)
 [place]ornic c.w. pantop

## 2023-07-31 NOTE — ED Provider Notes (Signed)
  EMERGENCY DEPARTMENT AT [facility] Provider Note  CSN: [id number] Arrival date & time: 07/31/23 1323  Chief Complaint(s) Swelling  HPI Brent Ray. is a 78 y.o. male history of A-fib, CHF, rheumatoid arthritis presenting to the emergency department with shortness of breath.  Patient reports shortness of breath which has been progressive over the past few weeks, associated with orthopnea, dyspnea on exertion.  Has also noticed bilateral lower extremity swelling.  Has been compliant with all of his home medications.  He followed up with his cardiologist yesterday, who drew labs, found patient to have evidence of CHF exacerbation.  They felt patient needed to come into the hospital for IV diuresis with Lasix  drip.  Patient denies any significant change in his symptoms since yesterday.  Continues to feel short of breath with exertion and having leg swelling.  No fevers or chills.  No chest pain.  No abdominal pain.  No nausea or vomiting.  No diarrhea.  No urinary symptoms.  No other new symptoms.   Past Medical History Past Medical History:  Diagnosis Date   A-fib [facility])    AICD (automatic cardioverter/defibrillator) present    Barrett's esophagus 2011   In [place] . Records in Epic   CHF (congestive heart failure) (HCC)    Chronic kidney disease    Coronary atherosclerosis of native coronary artery    Nonobstructive   Essential hypertension, benign    GERD (gastroesophageal reflux disease)    Heart block AV first degree    Chronic   History of kidney stones    Hypercholesterolemia    Mixed hyperlipidemia    NSVT (nonsustained ventricular tachycardia) (HCC)    Holter monitor, 2011; recommended RF ablation by Dr. Nunzio Belch - never required   PAF (paroxysmal atrial fibrillation) [facility])    Recently documented in [facility] June 2016   Pre-diabetes    Rheumatoid arthritis (HCC)    Sinus bradycardia    Symptomatic, acebutolol discontinued   Skin cancer    Sleep  apnea    pt denies   Patient Active Problem List   Diagnosis Date Noted   Preop cardiovascular exam 04/02/2023   AKI (acute kidney injury) (HCC) 04/02/2023   Acute on chronic systolic heart failure (HCC) 01/19/2023   Paroxysmal atrial flutter (HCC) 01/19/2023   Drug-induced gynecomastia 01/19/2023   Moderate mitral regurgitation 01/19/2023   Moderate tricuspid regurgitation 01/19/2023   History of GI bleed 01/19/2023   CKD (chronic kidney disease) stage 3, GFR 30-59 ml/min (HCC) 01/19/2023   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   Avascular necrosis of hip, 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/11/2022   Dysphagia 12/08/2019   Abdominal pain 12/08/2019   Rectal bleeding 12/08/2019   Persistent atrial fibrillation (HCC) 02/29/2016   History of panic attacks 02/29/2016   Second degree AV block 02/01/2016   D-dimer, elevated    Permanent atrial fibrillation (HCC)    NICM (nonischemic cardiomyopathy) (HCC)    Anticoagulated    Ischemic chest pain (HCC) 12/13/2015   Fatigue 11/09/2014   Internal hemorrhoids 10/31/2013   Esophageal reflux 10/31/2013   CAD in native artery    Sinus bradycardia    Heart block AV first degree    NSVT (nonsustained ventricular tachycardia) (HCC)    Mixed hyperlipidemia    Bradycardia 08/21/2012   Chest pain 01/21/2012   PAF (paroxysmal atrial fibrillation) (HCC) 04/11/2010   PALPITATIONS 03/02/2010   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 Authorizing Provider  acetaminophen  (TYLENOL ) 325 MG tablet Take 650 mg by mouth daily as needed for headache.    [provider]  ALPRAZolam  (XANAX ) 0.5 MG tablet Take 1 tablet by mouth 2 (two) times daily as needed.    [provider]  cephALEXin (KEFLEX) 500 MG capsule Take 500 mg by mouth. Only for dental appointments 10/21/22   [provider]  Cholecalciferol  1.25 MG (50000 UT) capsule Take 1 capsule by mouth once a week.    [provider]  CRANBERRY PO Take 500 mg by mouth daily.    [provider]  folic acid (FOLVITE) 1 MG tablet Take 1 mg by mouth daily. 6 days a week 12/13/22   [provider]  furosemide  (LASIX ) 20 MG tablet TAKE 1 TABLET(20 MG) BY MOUTH DAILY 07/26/23   Mallipeddi, Vishnu P, MD  ipratropium (ATROVENT) 0.06 % nasal spray Place 1 spray into both nostrils daily as needed for rhinitis. 01/07/23   [provider]  methotrexate (RHEUMATREX) 2.5 MG tablet Take 2.5 mg by mouth once a week. 6 pills weekly    [provider]  metoprolol  tartrate (LOPRESSOR ) 25 MG tablet Take 25 mg by mouth 2 (two) times daily.    [provider]  mexiletine (MEXITIL) 150 MG capsule Take 150 mg by mouth 2 (two) times daily.    [provider]  NITROSTAT  0.4 MG SL tablet DISSOLVE 1 TABLET UNDER TONGUE EVERY 5 MINUTES AS NEEDED FOR CHEST PAIN Patient taking differently: Place 0.4 mg under the tongue every 5 (five) minutes as needed for chest pain. 05/11/14   Gerard Knight, MD  ondansetron  (ZOFRAN ) 4 MG tablet Take 4 mg by mouth every 8 (eight) hours as needed for nausea. 01/15/23   [provider]  pantoprazole  (PROTONIX ) 40 MG tablet Take 40 mg by mouth every morning.    [provider]  Probiotic Product (PROBIOTIC DAILY PO) Take by mouth daily.    [provider]  Rivaroxaban  (XARELTO ) 15 MG TABS tablet Take 15 mg by mouth daily in the afternoon.    [provider]  rosuvastatin (CRESTOR) 5 MG tablet Take 5 mg by mouth as directed. Takes on Sunday    [provider]  traMADol (ULTRAM) 50 MG tablet Take 50 mg by mouth daily as needed for moderate pain.    [provider]                                                                                                                                    Past Surgical History Past Surgical  History:  Procedure Laterality Date   CARDIAC CATHETERIZATION N/A 12/13/2015   Procedure: Left Heart Cath and Coronary Angiography;  Surgeon: Avanell Leigh, MD;  Location: [facility] INVASIVE CV LAB;  Service: Cardiovascular;  Laterality: N/A;   CARDIOVERSION N/A 04/11/2016  Procedure: CARDIOVERSION;  Surgeon: Maudine Sos, MD;  Location: [facility] ENDOSCOPY;  Service: Cardiovascular;  Laterality: N/A;   COLONOSCOPY  03/13/2012   [place]--Dr. Spainhour   EP IMPLANTABLE DEVICE N/A 02/01/2016   Procedure: Pacemaker Implant;  Surgeon: Jolly Needle, MD;  Location: [facility] INVASIVE CV LAB;  Service: Cardiovascular;  Laterality: N/A;   ESOPHAGOGASTRODUODENOSCOPY  03/13/2012   [place], [place]--Dr. Spainhour   HERNIA REPAIR     2   KIDNEY STONE SURGERY     LEFT HEART CATHETERIZATION WITH CORONARY ANGIOGRAM N/A 08/20/2012   Procedure: LEFT HEART CATHETERIZATION WITH CORONARY ANGIOGRAM;  Surgeon: Eilleen Grates, MD;  Location: [facility] CATH LAB;  Service: Cardiovascular;  Laterality: N/A;   Left leg surgery  03/13/1977   SKIN CANCER EXCISION     TOTAL HIP ARTHROPLASTY Left 01/11/2022   Procedure: TOTAL HIP ARTHROPLASTY ANTERIOR APPROACH;  Surgeon: Adonica Hoose, MD;  Location: [facility] ORS;  Service: Orthopedics;  Laterality: Left;  150   Family History Family History  Problem Relation Age of Onset   Colon cancer Mother    Other Sister         AGE-54-HEALTHY   Other Sister        AGE 30-HEALTHY   Other Sister        AGE 65 HEALTHY   Other Son        AGE 38 HEALTHY    Social History Social History   Tobacco Use   Smoking status: Never   Smokeless tobacco: Never  Vaping Use   Vaping status: Never Used  Substance Use Topics   Alcohol  use: Yes    Alcohol /week: 2.0 standard drinks of alcohol     Types: 2 Cans of beer per week    Comment: Occasional beer   Drug use: No   Allergies Amiodarone, Flecainide , Carvedilol , Dapagliflozin, Eplerenone, Flomax [tamsulosin hcl], Lisinopril , Metoprolol ,  Sacubitril-valsartan, Spironolactone , Statins, and Sulfasalazine  Review of Systems Review of Systems  All other systems reviewed and are negative.   Physical Exam Vital Signs  I have reviewed the triage vital signs BP (!) 166/72   Pulse (!) 51   Temp (!) 97.5 F (36.4 C) (Oral)   Resp 13   Ht 5\' 8"  (1.727 m)   Wt 54.4 kg   SpO2 100%   BMI 18.25 kg/m  Physical Exam Vitals and nursing note reviewed.  Constitutional:      General: He is not in acute distress.    Appearance: Normal appearance.  HENT:     Mouth/Throat:     Mouth: Mucous membranes are moist.  Eyes:     Conjunctiva/sclera: Conjunctivae normal.  Neck:     Vascular: JVD present.  Cardiovascular:     Rate and Rhythm: Normal rate and regular rhythm.  Pulmonary:     Effort: Pulmonary effort is normal. No respiratory distress.     Breath sounds: Normal breath sounds.  Abdominal:     General: Abdomen is flat.     Palpations: Abdomen is soft.     Tenderness: There is no abdominal tenderness.  Musculoskeletal:     Right lower leg: Edema present.     Left lower leg: Edema present.  Skin:    General: Skin is warm and dry.     Capillary Refill: Capillary refill takes less than 2 seconds.  Neurological:     Mental Status: He is alert and oriented to person, place, and time. Mental status is at baseline.  Psychiatric:        Mood and Affect:  Mood normal.        Behavior: Behavior normal.     ED Results and Treatments Labs (all labs ordered are listed, but only abnormal results are displayed) Labs Reviewed  CBC WITH DIFFERENTIAL/PLATELET - Abnormal; Notable for the following components:      Result Value   MCV 103.5 (*)    MCH 34.1 (*)    Lymphs Abs 0.5 (*)    All other components within normal limits  BASIC METABOLIC PANEL WITH GFR - Abnormal; Notable for the following components:   Potassium 3.3 (*)    CO2 16 (*)    Glucose, Bld 123 (*)    BUN 41 (*)    Creatinine, Ser 2.11 (*)    Calcium 8.5 (*)     GFR, Estimated 32 (*)    All other components within normal limits  BRAIN NATRIURETIC PEPTIDE - Abnormal; Notable for the following components:   B Natriuretic Peptide 1,699.0 (*)    All other components within normal limits  MAGNESIUM  TROPONIN I (HIGH SENSITIVITY)                                                                                                                          Radiology DG Chest 2 View Result Date: 07/31/2023 CLINICAL DATA:  Shortness of breath. EXAM: CHEST - 2 VIEW COMPARISON:  02/02/2016. FINDINGS: Bilateral lung fields are clear. Bilateral costophrenic angles are clear. Normal cardio-mediastinal silhouette. There is a left sided 3-lead pacemaker. No acute osseous abnormalities. The soft tissues are within normal limits. IMPRESSION: *No active cardiopulmonary disease. Electronically Signed   By: [name] M.D.   On: 07/31/2023 14:19    Pertinent labs & imaging results that were available during my care of the patient were reviewed by me and considered in my medical decision making (see MDM for details).  Medications Ordered in ED Medications  furosemide  (LASIX ) injection 40 mg (has no administration in time range)  potassium chloride  SA (KLOR-CON  M) CR tablet 20 mEq (has no administration in time range)                                                                                                                                     Procedures Ultrasound ED Echo  Date/Time: 07/31/2023 5:15 PM  Performed by: Mordecai Applebaum, MD Authorized by: Mordecai Applebaum, MD   Procedure details:  Indications: dyspnea     Views: parasternal long axis view, parasternal short axis view and IVC view     Images: archived   Findings:    Pericardium: no pericardial effusion     LV Function: depressed (30 - 50%)     IVC: dilated   Impression:    Impression: probable elevated CVP     (including critical care time)  Medical Decision Making / ED  Course   MDM:  78 year old presenting to the emergency department shortness of breath.  Patient overall well-appearing, physical examination with signs of mild volume overload.  Reviewed cardiology note from yesterday, which does recommend patient be admitted for CHF exacerbation and need for Lasix  infusion.  Performed bedside ultrasound, does have systolic dysfunction and some IVC dilation.  BNP is elevated.  Patient will be admitted for further management of CHF.  Discussed with Dr. Cleda Curly who has admitted patient for further management.  Low concern for other cause of shortness of breath.  Chest x-ray clear.  No sign of pneumonia or pneumothorax.  Doubt pulmonary embolism, patient on chronic anticoagulation and did not having chest pain.  Does have some slightly worsening renal dysfunction but seems more likely CHF.      Additional history obtained: -Additional history obtained from spouse -External records from outside source obtained and reviewed including: Chart review including previous notes, labs, imaging, consultation notes including prior notes    Lab Tests: -I ordered, reviewed, and interpreted labs.   The pertinent results include:   Labs Reviewed  CBC WITH DIFFERENTIAL/PLATELET - Abnormal; Notable for the following components:      Result Value   MCV 103.5 (*)    MCH 34.1 (*)    Lymphs Abs 0.5 (*)    All other components within normal limits  BASIC METABOLIC PANEL WITH GFR - Abnormal; Notable for the following components:   Potassium 3.3 (*)    CO2 16 (*)    Glucose, Bld 123 (*)    BUN 41 (*)    Creatinine, Ser 2.11 (*)    Calcium 8.5 (*)    GFR, Estimated 32 (*)    All other components within normal limits  BRAIN NATRIURETIC PEPTIDE - Abnormal; Notable for the following components:   B Natriuretic Peptide 1,699.0 (*)    All other components within normal limits  MAGNESIUM  TROPONIN I (HIGH SENSITIVITY)    Notable for ckd, elevated BNP   Imaging Studies  ordered: I ordered imaging studies including CXR On my interpretation imaging demonstrates no acute process I independently visualized and interpreted imaging. I agree with the radiologist interpretation   Medicines ordered and prescription drug management: Meds ordered this encounter  Medications   furosemide  (LASIX ) injection 40 mg   potassium chloride  SA (KLOR-CON  M) CR tablet 20 mEq    -I have reviewed the patients home medicines and have made adjustments as needed   Consultations Obtained: I requested consultation with the hospitalist,  and discussed lab and imaging findings as well as pertinent plan - they recommend: admission   Reevaluation: After the interventions noted above, I reevaluated the patient and found that their symptoms have improved  Co morbidities that complicate the patient evaluation  Past Medical History:  Diagnosis Date   A-fib [facility])    AICD (automatic cardioverter/defibrillator) present    Barrett's esophagus 2011   In [place] . Records in Epic   CHF (congestive heart failure) (HCC)    Chronic kidney disease    Coronary atherosclerosis of native coronary  artery    Nonobstructive   Essential hypertension, benign    GERD (gastroesophageal reflux disease)    Heart block AV first degree    Chronic   History of kidney stones    Hypercholesterolemia    Mixed hyperlipidemia    NSVT (nonsustained ventricular tachycardia) (HCC)    Holter monitor, 2011; recommended RF ablation by Dr. Nunzio Belch - never required   PAF (paroxysmal atrial fibrillation) [facility])    Recently documented in [facility] June 2016   Pre-diabetes    Rheumatoid arthritis (HCC)    Sinus bradycardia    Symptomatic, acebutolol discontinued   Skin cancer    Sleep apnea    pt denies      Dispostion: Disposition decision including need for hospitalization was considered, and patient admitted to the hospital.    Final Clinical Impression(s) / ED Diagnoses Final diagnoses:  Acute  congestive heart failure, unspecified heart failure type [facility])     This chart was dictated using voice recognition software.  Despite best efforts to proofread,  errors can occur which can change the documentation meaning.    Mordecai Applebaum, MD 07/31/23 [phone]

## 2023-07-31 NOTE — Assessment & Plan Note (Signed)
 Patient bicarbonate is 16, normal anion gap denies any diarrhea vomiting recently.  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  Will start the patient on sodium bicarbonate therapy.  And monitor the bicarbonate level.  Patient's creatinine seems to be at his chronic baseline

## 2023-07-31 NOTE — Assessment & Plan Note (Addendum)
 See HPI for details, this seems to have been going on for several weeks.  Patient saw cardiology yesterday and there was concern that the patient fluid status was uncontrolled.  Patient got about 60 mg of total Lasix  yesterday and then sent to the ER today.  Here on my evaluation I think patient is euvolemic.  Although there is some left lower extremity edema, this may be due to prior traumatic injury.  The BNP being pretty high is obviously of concern.  Regardless patient has received 40 mg of IV Lasix  in the ER.  Will monitor intake output and daily weight.  For some reason patient has been taking only as needed Lasix  at home instead of standing doses.  I advised wife and patient to probably start taking daily Lasix .  I advised to consider not stopping the Lasix  even.  Patient is feeling pretty good in terms of his volume status.  Another potential etiology of patient's symptoms may be his acidosis metabolic, see below  Patient is not hypoxic and does not appear to be distressed in the stretcher.  Will request ambulatory pulse oximetry. Check trop. Patinet takes xaretlo at home, unlikely to have VTE

## 2023-07-31 NOTE — Assessment & Plan Note (Signed)
 Reported to me by patient. C.w. methtrexate. No flair.

## 2023-07-31 NOTE — H&P (Addendum)
 History and Physical    Patient: Brent Ray. ZOX:[id number] DOB: Aug 08, 1945 DOA: 07/31/2023 DOS: the patient was seen and examined on 07/31/2023 PCP: Tressie Fryer, MD  Patient coming from: Home>sent by cardiology to ER.  Chief Complaint:  Chief Complaint  Patient presents with   Swelling   HPI: Brent Ray. is a 78 y.o. male with medical history significant of known CHF reduced EF, Afib, CKD, priro AICD implantation.   Patient seems to have chronic fatigue, some orthopnea, chronic left lower extremity swelling that is due to some remote traumatic injury and "poor circulation".  Symptoms have been going on at least for 6 to 8 weeks.  As per the wife at the bedside who takes the most initiative in providing information.  It is questionable if symptoms have progressed more recently.  Patient does not have any fever chest pain palpitation or loss of consciousness.  Patient was evaluated by his cardiologist yesterday. And advised to come to the ER today for:  "Acute on chronic systolic heart failure, near compensated: Worsening DOE, orthopnea and leg swelling x 4 to 6 weeks. "  Patient is s.p lasix  iv in the ER. Medical eval is sought.  Review of Systems: As mentioned in the history of present illness. All other systems reviewed and are negative. Past Medical History:  Diagnosis Date   A-fib [facility])    AICD (automatic cardioverter/defibrillator) present    Barrett's esophagus 2011   In [place] . Records in Epic   CHF (congestive heart failure) (HCC)    Chronic kidney disease    Coronary atherosclerosis of native coronary artery    Nonobstructive   Essential hypertension, benign    GERD (gastroesophageal reflux disease)    Heart block AV first degree    Chronic   History of kidney stones    Hypercholesterolemia    Mixed hyperlipidemia    NSVT (nonsustained ventricular tachycardia) (HCC)    Holter monitor, 2011; recommended RF ablation by Dr. Nunzio Belch - never required    PAF (paroxysmal atrial fibrillation) [facility])    Recently documented in [facility] June 2016   Pre-diabetes    Rheumatoid arthritis (HCC)    Sinus bradycardia    Symptomatic, acebutolol discontinued   Skin cancer    Sleep apnea    pt denies   Past Surgical History:  Procedure Laterality Date   CARDIAC CATHETERIZATION N/A 12/13/2015   Procedure: Left Heart Cath and Coronary Angiography;  Surgeon: Avanell Leigh, MD;  Location: [facility] INVASIVE CV LAB;  Service: Cardiovascular;  Laterality: N/A;   CARDIOVERSION N/A 04/11/2016   Procedure: CARDIOVERSION;  Surgeon: Maudine Sos, MD;  Location: [facility] ENDOSCOPY;  Service: Cardiovascular;  Laterality: N/A;   COLONOSCOPY  03/13/2012   [place]--Dr. Spainhour   EP IMPLANTABLE DEVICE N/A 02/01/2016   Procedure: Pacemaker Implant;  Surgeon: Jolly Needle, MD;  Location: [facility] INVASIVE CV LAB;  Service: Cardiovascular;  Laterality: N/A;   ESOPHAGOGASTRODUODENOSCOPY  03/13/2012   [place], [place]--Dr. Spainhour   HERNIA REPAIR     2   KIDNEY STONE SURGERY     LEFT HEART CATHETERIZATION WITH CORONARY ANGIOGRAM N/A 08/20/2012   Procedure: LEFT HEART CATHETERIZATION WITH CORONARY ANGIOGRAM;  Surgeon: Eilleen Grates, MD;  Location: [facility] CATH LAB;  Service: Cardiovascular;  Laterality: N/A;   Left leg surgery  03/13/1977   SKIN CANCER EXCISION     TOTAL HIP ARTHROPLASTY Left 01/11/2022   Procedure: TOTAL HIP ARTHROPLASTY ANTERIOR APPROACH;  Surgeon: Adonica Hoose, MD;  Location: [facility] ORS;  Service: Orthopedics;  Laterality: Left;  150   Social History:  reports that he has never smoked. He has never used smokeless tobacco. He reports current alcohol  use of about 2.0 standard drinks of alcohol  per week. He reports that he does not use drugs.  Allergies  Allergen Reactions   Amiodarone Other (See Comments)    Pt reports vision changes and walking changes    Flecainide  Other (See Comments)    Severe headaches   Carvedilol  Other (See Comments)    Unknown     Dapagliflozin Diarrhea   Eplerenone Other (See Comments)    Unknown    Flomax [Tamsulosin Hcl] Hives, Itching and Rash   Lisinopril  Nausea And Vomiting   Metoprolol  Other (See Comments)    Unknown    Sacubitril-Valsartan Nausea And Vomiting   Spironolactone  Other (See Comments)    Unknown    Statins Other (See Comments)    Leg cramps   Sulfasalazine Nausea Only    Family History  Problem Relation Age of Onset   Colon cancer Mother    Other Sister         AGE-53-HEALTHY   Other Sister        AGE 36-HEALTHY   Other Sister        AGE 70 HEALTHY   Other Son        AGE 75 HEALTHY    Prior to Admission medications   Medication Sig Start Date End Date Taking? Authorizing Provider  acetaminophen  (TYLENOL ) 325 MG tablet Take 650 mg by mouth daily as needed for headache.    [provider]  ALPRAZolam  (XANAX ) 0.5 MG tablet Take 1 tablet by mouth 2 (two) times daily as needed.    [provider]  cephALEXin (KEFLEX) 500 MG capsule Take 500 mg by mouth. Only for dental appointments 10/21/22   [provider]  Cholecalciferol 1.25 MG (50000 UT) capsule Take 1 capsule by mouth once a week.    [provider]  CRANBERRY PO Take 500 mg by mouth daily.    [provider]  folic acid (FOLVITE) 1 MG tablet Take 1 mg by mouth daily. 6 days a week 12/13/22   [provider]  furosemide  (LASIX ) 20 MG tablet TAKE 1 TABLET(20 MG) BY MOUTH DAILY 07/26/23   Mallipeddi, Vishnu P, MD  ipratropium (ATROVENT) 0.06 % nasal spray Place 1 spray into both nostrils daily as needed for rhinitis. 01/07/23   [provider]  methotrexate (RHEUMATREX) 2.5 MG tablet Take 2.5 mg by mouth once a week. 6 pills weekly    [provider]  metoprolol  tartrate (LOPRESSOR ) 25 MG tablet Take 25 mg by mouth 2 (two) times daily.    [provider]  mexiletine (MEXITIL) 150 MG capsule Take 150 mg by mouth 2 (two) times daily.    [provider]  NITROSTAT  0.4 MG SL tablet DISSOLVE 1 TABLET UNDER TONGUE EVERY 5 MINUTES AS NEEDED FOR CHEST PAIN Patient taking differently: Place 0.4 mg under the tongue every 5 (five) minutes as needed for chest pain. 05/11/14   Gerard Knight, MD  ondansetron  (ZOFRAN ) 4 MG tablet Take 4 mg by mouth every 8 (eight) hours as needed for nausea. 01/15/23   [provider]  pantoprazole  (PROTONIX ) 40 MG tablet Take 40 mg by mouth every morning.    [provider]  Probiotic Product (PROBIOTIC DAILY PO) Take by mouth daily.    [provider]  Rivaroxaban  (XARELTO ) 15 MG TABS  tablet Take 15 mg by mouth daily in the afternoon.    [provider]  rosuvastatin (CRESTOR) 5 MG tablet Take 5 mg by mouth as directed. Takes on Sunday    [provider]  traMADol (ULTRAM) 50 MG tablet Take 50 mg by mouth daily as needed for moderate pain.    [provider]    Physical Exam: Vitals:   07/31/23 1353 07/31/23 1354 07/31/23 1615 07/31/23 1738  BP: (!) 148/91  (!) 166/72   Pulse: 85  (!) 51   Resp: (!) 22  13   Temp: (!) 97.5 F (36.4 C)   98.6 F (37 C)  TempSrc: Oral   Oral  SpO2: 100%  100%   Weight:  54.4 kg    Height:  5\' 8"  (1.727 m)     General: Patient appears to be in no distress on room air, wife at bedside.  Patient gives a reasonably coherent account of his symptoms although wife is the principal historian Respiratory exam: Bilateral intravesicular Cardiovascular exam S1-S2 normal, no jugular venous distention appreciated Abdomen scaphoid Extremities warm no edema on the right lower extremity, 1+ edema at left ankle.  Data Reviewed:  Labs on Admission:  Results for orders placed or performed during the hospital encounter of 07/31/23 (from the past 24 hours)  CBC with Differential     Status: Abnormal   Collection Time: 07/31/23  2:40 PM  Result Value Ref Range   WBC 7.7 4.0 - 10.5 K/uL   RBC 4.34 4.22 - 5.81 MIL/uL   Hemoglobin 14.8  13.0 - 17.0 g/dL   HCT 16.1 09.6 - 04.5 %   MCV 103.5 (H) 80.0 - 100.0 fL   MCH 34.1 (H) 26.0 - 34.0 pg   MCHC 33.0 30.0 - 36.0 g/dL   RDW 40.9 81.1 - 91.4 %   Platelets 183 150 - 400 K/uL   nRBC 0.0 0.0 - 0.2 %   Neutrophils Relative % 91 %   Neutro Abs 6.9 1.7 - 7.7 K/uL   Lymphocytes Relative 7 %   Lymphs Abs 0.5 (L) 0.7 - 4.0 K/uL   Monocytes Relative 2 %   Monocytes Absolute 0.2 0.1 - 1.0 K/uL   Eosinophils Relative 0 %   Eosinophils Absolute 0.0 0.0 - 0.5 K/uL   Basophils Relative 0 %   Basophils Absolute 0.0 0.0 - 0.1 K/uL   Immature Granulocytes 0 %   Abs Immature Granulocytes 0.03 0.00 - 0.07 K/uL  Basic metabolic panel     Status: Abnormal   Collection Time: 07/31/23  2:40 PM  Result Value Ref Range   Sodium 135 135 - 145 mmol/L   Potassium 3.3 (L) 3.5 - 5.1 mmol/L   Chloride 106 98 - 111 mmol/L   CO2 16 (L) 22 - 32 mmol/L   Glucose, Bld 123 (H) 70 - 99 mg/dL   BUN 41 (H) 8 - 23 mg/dL   Creatinine, Ser 7.82 (H) 0.61 - 1.24 mg/dL   Calcium 8.5 (L) 8.9 - 10.3 mg/dL   GFR, Estimated 32 (L) >60 mL/min   Anion gap 13 5 - 15  Brain natriuretic peptide     Status: Abnormal   Collection Time: 07/31/23  2:40 PM  Result Value Ref Range   B Natriuretic Peptide 1,699.0 (H) 0.0 - 100.0 pg/mL   Basic Metabolic Panel: Recent Labs  Lab 07/31/23 1440  NA 135  K 3.3*  CL 106  CO2 16*  GLUCOSE 123*  BUN 41*  CREATININE 2.11*  CALCIUM 8.5*   Liver Function Tests: No results for input(s): "AST", "ALT", "ALKPHOS", "BILITOT", "PROT", "ALBUMIN " in the last 168 hours. No results for input(s): "LIPASE", "AMYLASE" in the last 168 hours. No results for input(s): "AMMONIA" in the last 168 hours. CBC: Recent Labs  Lab 07/31/23 1440  WBC 7.7  NEUTROABS 6.9  HGB 14.8  HCT 44.9  MCV 103.5*  PLT 183   Cardiac Enzymes: No results for input(s): "CKTOTAL", "CKMB", "CKMBINDEX", "TROPONINIHS" in the last 168 hours.  BNP (last 3 results) No results for input(s): "PROBNP" in  the last 8760 hours. CBG: No results for input(s): "GLUCAP" in the last 168 hours.  Radiological Exams on Admission:  DG Chest 2 View Result Date: 07/31/2023 CLINICAL DATA:  Shortness of breath. EXAM: CHEST - 2 VIEW COMPARISON:  02/02/2016. FINDINGS: Bilateral lung fields are clear. Bilateral costophrenic angles are clear. Normal cardio-mediastinal silhouette. There is a left sided 3-lead pacemaker. No acute osseous abnormalities. The soft tissues are within normal limits. IMPRESSION: *No active cardiopulmonary disease. Electronically Signed   By: [name] M.D.   On: 07/31/2023 14:19    chest X-ray  EKG: Independently reviewed.  Artifact, no sinus activity identified, paced ventricular rhythm  No intake/output data recorded. No intake/output data recorded.        Assessment and Plan: * Shortness of breath See HPI for details, this seems to have been going on for several weeks.  Patient saw cardiology yesterday and there was concern that the patient fluid status was uncontrolled.  Patient got about 60 mg of total Lasix  yesterday and then sent to the ER today.  Here on my evaluation I think patient is euvolemic.  Although there is some left lower extremity edema, this may be due to prior traumatic injury.  The BNP being pretty high is obviously of concern.  Regardless patient has received 40 mg of IV Lasix  in the ER.  Will monitor intake output and daily weight.  For some reason patient has been taking only as needed Lasix  at home instead of standing doses.  I advised wife and patient to probably start taking daily Lasix .  I advised to consider not stopping the Lasix  even.  Patient is feeling pretty good in terms of his volume status.  Another potential etiology of patient's symptoms may be his acidosis metabolic, see below  Patient is not hypoxic and does not appear to be distressed in the stretcher.  Will request ambulatory pulse oximetry. Check trop. Patinet takes xaretlo at home,  unlikely to have VTE  Macrocytosis Check B12 folate and TSH  Metabolic acidosis Patient bicarbonate is 16, normal anion gap denies any diarrhea vomiting recently.  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  Will start the patient on sodium bicarbonate therapy.  And monitor the bicarbonate level.  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 C.w. lasix  (change from PRN to standing) esp in light of getting started on sodium bicarbonate PO therapy. Discussed with patinet and  wife as above. Furhter optimization by long standing cardiology.  Essential hypertension, benign Patient's blood pressure is above target.  Patient declines to be started on standing antihypertensive agents.  I will ordered some as needed IV agents in case patient's systolic blood pressure is over 160.  Dyslipidemia C/w crestor  Peptic ulcer disease Chornic c.w. pantop  Rheumatism Reported to me by patient. C.w. methtrexate. No flair.  PAF (paroxysmal atrial fibrillation) (HCC) Continue with Xarelto , patient has implanted pacemaker paced rhythm. Mexiletine.   Please review med rec after pharmcy input    Advance Care Planning:   Code Status: Limited: Do not attempt resuscitation (DNR) -DNR-LIMITED -Do Not Intubate/DNI  per patient and wfie at bedside.  Consults: none at this time.  Family Communication: wife at bedside. Discussed in detail.  Severity of Illness: The  appropriate patient status for this patient is OBSERVATION. Observation status is judged to be reasonable and necessary in order to provide the required intensity of service to ensure the patient's safety. The patient's presenting symptoms, physical exam findings, and initial radiographic and laboratory data in the context of their medical condition is felt to place them at decreased risk for further clinical deterioration. Furthermore, it is anticipated that the patient will be medically stable for discharge from the hospital within 2 midnights of admission.   Author: Bennie Brave, MD 07/31/2023 5:45 PM  For on call review [email or web address].

## 2023-07-31 NOTE — ED Notes (Signed)
 ED Provider at bedside.

## 2023-07-31 NOTE — ED Triage Notes (Addendum)
 Pt seen cardiologist yesterday, instructed to go to [facility] ED for overload and SOB with exertion, no distress noted in triage, pt able to speak with complete sentences. Pt with hx of CHF. Wife states pt has been losing weight due to not eating well.

## 2023-07-31 NOTE — Assessment & Plan Note (Addendum)
 Continue with Xarelto , patient has implanted pacemaker paced rhythm. Mexiletine.

## 2023-08-01 ENCOUNTER — Other Ambulatory Visit (HOSPITAL_COMMUNITY)

## 2023-08-01 DIAGNOSIS — I4821 Permanent atrial fibrillation: Secondary | ICD-10-CM | POA: Diagnosis not present

## 2023-08-01 DIAGNOSIS — I5023 Acute on chronic systolic (congestive) heart failure: Secondary | ICD-10-CM

## 2023-08-01 DIAGNOSIS — R0602 Shortness of breath: Secondary | ICD-10-CM | POA: Diagnosis not present

## 2023-08-01 LAB — COMPREHENSIVE METABOLIC PANEL WITH GFR
ALT: 32 U/L (ref 0–44)
AST: 21 U/L (ref 15–41)
Albumin: 3.3 g/dL — ABNORMAL LOW (ref 3.5–5.0)
Alkaline Phosphatase: 42 U/L (ref 38–126)
Anion gap: 10 (ref 5–15)
BUN: 44 mg/dL — ABNORMAL HIGH (ref 8–23)
CO2: 18 mmol/L — ABNORMAL LOW (ref 22–32)
Calcium: 8.1 mg/dL — ABNORMAL LOW (ref 8.9–10.3)
Chloride: 108 mmol/L (ref 98–111)
Creatinine, Ser: 2.28 mg/dL — ABNORMAL HIGH (ref 0.61–1.24)
GFR, Estimated: 29 mL/min — ABNORMAL LOW (ref >60)
Glucose, Bld: 101 mg/dL — ABNORMAL HIGH (ref 70–99)
Potassium: 3.6 mmol/L (ref 3.5–5.1)
Sodium: 136 mmol/L (ref 135–145)
Total Bilirubin: 1.2 mg/dL (ref 0.0–1.2)
Total Protein: 6 g/dL — ABNORMAL LOW (ref 6.5–8.1)

## 2023-08-01 LAB — PROTIME-INR
INR: 1.1 (ref 0.8–1.2)
Prothrombin Time: 14 s (ref 11.4–15.2)

## 2023-08-01 LAB — CBC
HCT: 42.3 % (ref 39.0–52.0)
Hemoglobin: 14.3 g/dL (ref 13.0–17.0)
MCH: 33.3 pg (ref 26.0–34.0)
MCHC: 33.8 g/dL (ref 30.0–36.0)
MCV: 98.6 fL (ref 80.0–100.0)
Platelets: 173 10*3/uL (ref 150–400)
RBC: 4.29 MIL/uL (ref 4.22–5.81)
RDW: 13.6 % (ref 11.5–15.5)
WBC: 6.5 10*3/uL (ref 4.0–10.5)
nRBC: 0 % (ref 0.0–0.2)

## 2023-08-01 LAB — TSH: TSH: 1.962 u[IU]/mL (ref 0.350–4.500)

## 2023-08-01 LAB — FOLATE: Folate: >40 ng/mL (ref >5.9)

## 2023-08-01 LAB — APTT: aPTT: 24 s (ref 24–36)

## 2023-08-01 LAB — VITAMIN B12: Vitamin B-12: 1847 pg/mL — ABNORMAL HIGH (ref 180–914)

## 2023-08-01 MED ORDER — FUROSEMIDE 10 MG/ML IJ SOLN
40.0000 mg | Freq: Once | INTRAMUSCULAR | Status: AC
  Administered 2023-08-01: 40 mg via INTRAVENOUS
  Filled 2023-08-01: qty 4

## 2023-08-01 MED ORDER — POTASSIUM CHLORIDE CRYS ER 20 MEQ PO TBCR
40.0000 meq | EXTENDED_RELEASE_TABLET | Freq: Every day | ORAL | Status: DC
  Administered 2023-08-01: 40 meq via ORAL
  Filled 2023-08-01: qty 2

## 2023-08-01 MED ORDER — FUROSEMIDE 20 MG PO TABS: 20.0000 mg | ORAL_TABLET | ORAL | 0 refills | Status: DC

## 2023-08-01 MED ORDER — FUROSEMIDE 20 MG PO TABS: 20.0000 mg | ORAL_TABLET | ORAL | Status: DC

## 2023-08-01 NOTE — Discharge Summary (Signed)
 Physician Discharge Summary   Patient: Brent Ray. MRN: [id number] DOB: 1946-03-06  Admit date:     07/31/2023  Discharge date: 08/01/23  Discharge Physician: Wynetta Heckle   PCP: Tressie Fryer, MD   Recommendations at discharge:  Follow up with cardiology, referral to advance heart failure clinic at [facility] is pending. Started lasix  20mg  every other day. GDMT extremely limited as discussed below.   Discharge Diagnoses: Principal Problem:   Shortness of breath Active Problems:   Essential hypertension, benign   NICM (nonischemic cardiomyopathy) (HCC)   Metabolic acidosis   Macrocytosis   Rheumatism   Peptic ulcer disease   Dyslipidemia  Hospital Course: Per Dr. Cleda Curly, admitting MD: HPI: [name]. is a 78 y.o. male with medical history significant of known CHF reduced EF, Afib, CKD, priro AICD implantation.    Patient seems to have chronic fatigue, some orthopnea, chronic left lower extremity swelling that is due to some remote traumatic injury and "poor circulation".  Symptoms have been going on at least for 6 to 8 weeks.  As per the wife at the bedside who takes the most initiative in providing information.  It is questionable if symptoms have progressed more recently.  Patient does not have any fever chest pain palpitation or loss of consciousness.  Patient was evaluated by his cardiologist yesterday. And advised to come to the ER today for:   "Acute on chronic systolic heart failure, near compensated: Worsening DOE, orthopnea and leg swelling x 4 to 6 weeks. "   Patient is s.p lasix  iv in the ER. Medical eval is sought.  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  - diarrhea on SGLT2i - from notes typically self discontinues meds due to reported side effects.  - in summary side effects to all  beta blocker, ARNI, ACEi, SGLT2i, MRAs. Could consider trial of hydral/imdur , its unclear to me if ARB by itself had been tried but with GFR of 29 would not start. He also is not in favor of trying new meds this admission.  -CXR no acute process, BNP 1699 - received IV lasix  40mg  yesterday and this AM. Negative 1.6 L thus far, slight uptrend in Cr with diuresis. Standing weight this AM 112 lbs, listed as 120 lbs 5/19 in clinic. Looks euvolemic by exam. REDSVEST is 23%, ambulated without dyspnea or hypoxemia on day of discharge reporting significant improvement in symptoms. Cleared for discharge by medical and cardiology teams on lasix  20mg  every other day (was only taking lasix  20mg  prn at home, roughly about once a month).  - Arrange f/u with advanced HF clinic.    2.Afib - history of multiple prior ablations - on xarelto  for stroke prevention -primary cardiologist considering watchman referral as outpatient. Has had issues with bruising, nosebleeds, bleeding hemorroids.      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      4.CKD -Cr 2.3 in Jan 2025, on admit 2.11 GFR 32     5. 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 On chronic Xarelto .    Consultants: Cardiology Procedures performed: None  Disposition: Home  Diet recommendation:  Discharge Diet Orders (From admission, onward)     Start     Ordered   08/01/23 0000  Diet - low sodium heart healthy        08/01/23 1332           DISCHARGE MEDICATION: Allergies as of 08/01/2023       Reactions   Amiodarone Other (See Comments)   Pt reports vision changes and walking changes    Flecainide  Other (See Comments)   Severe headaches, blurred vision   Carvedilol  Other (See  Comments)   Unknown    Dapagliflozin Diarrhea   Eplerenone Other (See Comments)   Unknown    Flomax [tamsulosin Hcl] Hives, Itching, Rash   Lisinopril  Nausea And Vomiting   Metoprolol  Other (See Comments)   Headaches, dizziness   Sacubitril-valsartan Nausea And Vomiting   Spironolactone  Swelling, Other (See Comments)   Breast swelling, headaches   Statins Other (See Comments)   Leg cramps   Sulfasalazine Nausea Only        Medication List     TAKE these medications    acetaminophen  325 MG tablet Commonly known as: TYLENOL  Take 650 mg by mouth daily as needed for headache.   cephALEXin 500 MG capsule Commonly known as: KEFLEX Take 500 mg by mouth once. Only for dental appointments   CRANBERRY PO Take 500 mg by mouth daily.   folic acid 1 MG tablet Commonly known as: FOLVITE Take 1 mg by mouth daily.  NOT on Sunday d/t methotrexate   furosemide  20 MG tablet Commonly known as: LASIX  Take 1 tablet (20 mg total) by mouth every other day. Start taking on: Aug 02, 2023 What changed: See the new instructions.   ipratropium 0.06 % nasal spray Commonly known as: ATROVENT Place 1 spray into both nostrils daily as needed for rhinitis.   methotrexate 2.5 MG tablet Commonly known as: RHEUMATREX Take 2.5 mg by mouth once a week. 6 pills weekly   mexiletine 150 MG capsule Commonly known as: MEXITIL Take 150 mg by mouth 2 (two) times daily.   Nitrostat  0.4 MG SL tablet Generic drug: nitroGLYCERIN  DISSOLVE 1 TABLET UNDER TONGUE EVERY 5 MINUTES AS NEEDED FOR CHEST PAIN What changed: See the new instructions.   ondansetron  4 MG tablet Commonly known as: ZOFRAN  Take 4 mg by mouth every 8 (eight) hours as needed for nausea.   pantoprazole  40 MG tablet Commonly known as: PROTONIX  Take 40 mg by mouth every evening.   potassium chloride  SA 20 MEQ tablet Commonly known as: KLOR-CON  M Take 20 mEq by mouth daily.   PROBIOTIC DAILY PO Take 110 mg by mouth daily.    rosuvastatin 5 MG tablet Commonly known as: CRESTOR Take 5 mg by mouth every Sunday.   traMADol 50 MG tablet Commonly known as: ULTRAM Take 50 mg by mouth daily as needed for moderate pain.   Xarelto  15 MG Tabs tablet Generic drug: Rivaroxaban  Take 15 mg by mouth daily in the afternoon.        Follow-up Information     Gerard Knight, MD Follow up.   Specialty: Cardiology Contact information: [address] [place] [address] [phone]         Lasalle Pointer, MD .   Specialties: Cardiology, Internal Medicine Contact information: [address] [place] [address] [phone]         Mealor, Donnamae Gaba, MD .   Specialty: Cardiology  Discharge Exam: Filed Weights   07/31/23 1354 07/31/23 1823 08/01/23 0521  Weight: 54.4 kg 53.3 kg 50.8 kg  No distress, pleasant Clear, nonlabored RRR, no MRG or pitting edema, V paced rhythm on telemetry with PVCs  Condition at discharge: stable  The results of significant diagnostics from this hospitalization (including imaging, microbiology, ancillary and laboratory) are listed below for reference.   Imaging Studies: DG Chest 2 View Result Date: 07/31/2023 CLINICAL DATA:  Shortness of breath. EXAM: CHEST - 2 VIEW COMPARISON:  02/02/2016. FINDINGS: Bilateral lung fields are clear. Bilateral costophrenic angles are clear. Normal cardio-mediastinal silhouette. There is a left sided 3-lead pacemaker. No acute osseous abnormalities. The soft tissues are within normal limits. IMPRESSION: *No active cardiopulmonary disease. Electronically Signed   By: [name] M.D.   On: 07/31/2023 14:19    Microbiology: Results for orders placed or performed during the hospital encounter of 01/06/22  Surgical pcr screen     Status: None   Collection Time: 01/06/22 10:25 AM   Specimen: Nasal Mucosa; Nasal Swab  Result Value Ref Range Status   MRSA, PCR NEGATIVE NEGATIVE Final   Staphylococcus  aureus NEGATIVE NEGATIVE Final    Comment: (NOTE) The Xpert SA Assay (FDA approved for NASAL specimens in patients 46 years of age and older), is one component of a comprehensive surveillance program. It is not intended to diagnose infection nor to guide or monitor treatment. Performed at [facility], [address], [place], [place] [address]     Labs: CBC: Recent Labs  Lab 07/31/23 1440 08/01/23 0402  WBC 7.7 6.5  NEUTROABS 6.9  --   HGB 14.8 14.3  HCT 44.9 42.3  MCV 103.5* 98.6  PLT 183 173   Basic Metabolic Panel: Recent Labs  Lab 07/31/23 1440 07/31/23 1740 08/01/23 0402  NA 135  --  136  K 3.3*  --  3.6  CL 106  --  108  CO2 16*  --  18*  GLUCOSE 123*  --  101*  BUN 41*  --  44*  CREATININE 2.11*  --  2.28*  CALCIUM 8.5*  --  8.1*  MG  --  2.2  --    Liver Function Tests: Recent Labs  Lab 08/01/23 0402  AST 21  ALT 32  ALKPHOS 42  BILITOT 1.2  PROT 6.0*  ALBUMIN  3.3*   CBG: No results for input(s): "GLUCAP" in the last 168 hours.  Discharge time spent: greater than 30 minutes.  Signed: Wynetta Heckle, MD [facility] 08/01/2023

## 2023-08-01 NOTE — Care Management CC44 (Signed)
 Condition Code 44 Documentation Completed  Patient Details  Name: Brent Ray. MRN: [id number] Date of Birth: Feb 13, 1946   Condition Code 44 given:  Yes Patient signature on Condition Code 44 notice:  Yes Documentation of 2 MD's agreement:  Yes Code 44 added to claim:  Yes    Orelia Binet, RN 08/01/2023, 12:56 PM

## 2023-08-01 NOTE — TOC Progression Note (Signed)
 Transition of Care (TOC) - Progression Note    Patient Details  Name: Brent Ray. MRN: [id number] Date of Birth: 07-08-1945  Transition of Care [facility]) CM/SW Contact  Orelia Binet, RN Phone Number: 08/01/2023, 12:58 PM  Clinical Narrative:    Transition of Care [facility]) - Inpatient Brief Assessment   Patient Details  Name: Brent Ray. MRN: [id number] Date of Birth: 03-13-1946  Transition of Care [facility]) CM/SW Contact:    Orelia Binet, RN Phone Number: 08/01/2023, 12:58 PM   Clinical Narrative:  Cardiology work up continues. Patient has scales, pulse oxy and checks weight and vitals daily. Wife at the beside. No needs at this time.     Transition of Care Asessment: Insurance and Status: Insurance coverage has been reviewed Patient has primary care physician: Yes Home environment has been reviewed: Home with Wife Prior level of function:: Independent Prior/Current Home Services: No current home services Social Drivers of Health Review: SDOH reviewed no interventions necessary Readmission risk has been reviewed: Yes Transition of care needs: no transition of care needs at this time    Expected Discharge Plan: Home/Self Care Barriers to Discharge: Continued Medical Work up    Social Determinants of Health (SDOH) Interventions SDOH Screenings   Food Insecurity: No Food Insecurity (01/11/2022)  Housing: Low Risk  (01/11/2022)  Transportation Needs: No Transportation Needs (01/11/2022)  Utilities: Not At Risk (01/11/2022)  Financial Resource Strain: Low Risk  (07/01/2021)   Received from [facility]  Tobacco Use: Low Risk  (07/31/2023)

## 2023-08-01 NOTE — Progress Notes (Signed)
 Patient ambulated in hallway times one assist, no c/o pain or discomfort noted. Plan of care on going.Dr Amanda Jungling notified.

## 2023-08-01 NOTE — Progress Notes (Signed)
 Nurse at bedside,patient alert and oriented times four.No c/o pain or discomfort noted.Family at bedside. Plan of care on going.

## 2023-08-01 NOTE — Progress Notes (Signed)
Patient discharged home with instructions given on medications and follow up visits,patient and family verbalized understanding. Prescriptions sent to Pharmacy of choice documented on AVS. IV discontinued,catheter intact. Accompanied by staff to an awaiting vehicle. 

## 2023-08-01 NOTE — Care Management Obs Status (Signed)
 MEDICARE OBSERVATION STATUS NOTIFICATION   Patient Details  Name: Brent Ray. MRN: [id number] Date of Birth: 1945/03/20   Medicare Observation Status Notification Given:  Yes    Orelia Binet, RN 08/01/2023, 12:56 PM

## 2023-08-01 NOTE — Progress Notes (Incomplete)
 Attending Note   Patient seen and discussed with PA Gennaro Khat, I agree with her documentation.   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    WBC 7.7 Hgb 14.8 Plt 183 K 3.3 BUN 41 Cr 2.11 BNP 1699 Mg 2.2  Trop 19 EKG: A sensed, Vpaced CXR: no acute process  [facility]  RHC/LHC in 2022 that showed calcified proximal vessels, 20% LCx, 20% OM1, 50% pRCA (with negative FFR), LVEDP 7, PCWP 14, PA 24/15/19, RV 21/6, RA 6, CO 5.9, CI 3.5.  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  - diarrhea on SGLT2i - from notes typically self discontinues meds due to reported side effects.  - in summary side effects to all  beta blocker, ARNI, ACEi, SGLT2i, MRAs. Could consider trial of hydral/imdur , its unclear to me if ARB by itself had been tried but with GFR of 29 would not start. He also is not in favor of trying new meds this admission.   -CXR no acute process, BNP 1699 - received IV lasix  40mg  yesterday and this AM. Negative 1.6 L thus far, slight uptrend in Cr with diuresis. Standing weight this AM 112 lbs, listed as 120 lbs 5/19 in clinic. Looks euvolemic by exam - check reds vest, ambulate patient. Likely can d/c later today - was only taking lasix  20mg  prn at home, roughly about once a month. Would plan for oral lasix  20mg  every other day at discharge. Arrange f/u with advanced HF clinic.   2.Afib - history of multiple prior ablations - on xarelto  for stroke  prevention -primary cardiologist considering watchman referral as outpatient. Has had issues with bruising, nosebleeds, bleeding hemorroids.    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 On chronic Xarelto .    Armida Lander MD

## 2023-08-01 NOTE — Plan of Care (Signed)
°  Problem: Clinical Measurements: Goal: Will remain free from infection Outcome: Progressing   Problem: Activity: Goal: Risk for activity intolerance will decrease Outcome: Progressing   Problem: Elimination: Goal: Will not experience complications related to bowel motility Outcome: Progressing

## 2023-08-01 NOTE — Consult Note (Addendum)
 Cardiology Consultation   Patient ID: Brent Ray. MRN: [id number]; DOB: 10-Feb-1946  Admit date: 07/31/2023 Date of Consult: 08/01/2023  PCP:  Brent Fryer, MD   [facility] Providers Cardiologist:  Brent Pointer, MD  Electrophysiologist:  Brent Grange, MD  {    Patient Profile:   Brent Ray. is a 78 y.o. 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  History of Present Illness:   Brent Ray is been seen by cardiology at [facility], [place] and [facility] over the years.  Had a remote cath in 2005 that showed nonobstructive CAD in [place] .  In 2014 the patient was transferred from [facility] to [facility] for chest pain, seen by [facility].  He underwent cardiac catheter showed no significant CAD.  EF was preserved.  Patient was admitted at [facility] 08/2014 with chest pain and hypertensive urgency. Cath showed 40% ostial 1st diag stenosis, 30% circumflex stenosis and 20% RCA stenosis. He was noted to have Afib and placed on Xarelto .  He was admitted to [facility] in 12/2015 with chest pain. Left heart cath showed 40% ostial RCA stenosis, 60% ostial D1 stenosis, severe LV dysfunction with EF less than 25% during cath.  Due to degree of CAD did not explain LV dysfunction and he was diagnosed with nonischemic cardiomyopathy with Takotsubo pattern.  Echo showed LVEF 40-45%, mild MR. Echo was reviewed further and it  was felt EF was 35 to 40%.  In 12/2015 he underwent pacemaker implant by Brent Ray for symptomatic bradycardia, long-standing persistent Afib and second degree AV block.Patient adverse reactions to amiodarone and flecainide . He has had multiple cardioversions and underwent multiple Afib ablations at [facility]  He underwent Afib ablation 06/2016, 11/2017 and 02/2020 at [facility]. Appears he saw [facility] cardiology from 04/2016- 03/2021, at which time he changed to [facility] cardiology. He established back with [facility] cardiology in 01/2023.  Echo 08/2016 LVEF EF 50%, G1DD, mild MR MPI 08/2016 no ischeia, EF 55% Echo 09/2017 LVEF 50%, mild MR, mild MV prolapse, mild to mod TR Echo 11/2018 LVEF 45-50%, G12DD, mild to mod MR, mild MV prolapse, mild TR cardiac cath 02/2021 (unsure where) LCx-20%, OM1-20%, pRCA-50%, Proximal Coronary artery calcifications, no targets for revascularizaton, recs for Med Mgmt (BB, Farxiga, ARB, diuretic), LVEDP-7, PCWP=14, Ray=24/15/19, RV=21/6, RA=6, CO=5.9, CI=3.5  cMRI 03/2021 LVEF 32%, dyssynchronous contraction of the septum, likely due to pacing, moderately reduced RVEF 45%, mod MR and mild TR Echo TEE 06/2021 LVEF 30%  Due to decline in EF he upgraded to CRT-D in 2023 at [facility], RV lead extraction was performed. based on device interrogations, he continued to be in A-fib and has been permanent Afib.   Most recent echocardiogram November 2024 showed EF of 30 to 35%, moderately decreased function, global hypokinesis, mildly dilated left atrium, mild prolapse of the posterior MV leaflet, moderate MR. He has not tolerated spironolactone , eplerenone, Entresto, lisinopril , metoprolol , coreg , or bisoprolol .  Patient saw [facility]  and most recent device check 02/2023 showed Biv pacing 87.9% of the time, no changes were made, 2 episodes of NSVT. Optival and thoracic impedance trend indicating possible fluid accumulation of the lat several days. He was started on mexiletine for PVCs.  The patient presented to the  [facility] ER 07/31/2023 with lower leg swelling, SOB and and orthopnea. He saw [facility] in the cardiology office, who increased lasix  with improvement but swelling continued. Labs showed pro-BNP 31,000 and it was recommended he go to the ER for IV diuresis. He denies any chest pain.  In the ER blood pressure 166/72, pulse 51 bpm, afebrile, respiratory rate 13, 100% O2 edema and JVD noted on exam.  Labs showed potassium 3.3, CO2 16, serum creatinine 2.11, BUN 41.  BNP 1699. HS trop 19 Chest x-ray showed no active disease. EKG showed V paced rhythm, 100bpm, PVCs. Patient was given IV Lasix  and potassium in the ER and admitted for further workup.   Past Medical History:  Diagnosis Date   A-fib [facility])    AICD (automatic cardioverter/defibrillator) present    Barrett's esophagus 2011   In [place] . Records in Epic   CHF (congestive heart failure) (HCC)    Chronic kidney disease    Coronary atherosclerosis of native coronary artery    Nonobstructive   Essential hypertension, benign    GERD (gastroesophageal reflux disease)    Heart block AV first degree    Chronic   History of kidney stones    Hypercholesterolemia    Mixed hyperlipidemia    NSVT (nonsustained ventricular tachycardia) (HCC)    Holter monitor, 2011; recommended RF ablation by [facility] - never required   PAF (paroxysmal atrial fibrillation) [facility])    Recently documented in [facility] June 2016   Pre-diabetes    Rheumatoid arthritis (HCC)    Sinus bradycardia    Symptomatic, acebutolol discontinued   Skin cancer    Sleep apnea    pt denies    Past Surgical History:  Procedure Laterality Date   CARDIAC CATHETERIZATION N/A 12/13/2015   Procedure: Left Heart Cath and Coronary Angiography;  Surgeon: Avanell Leigh, MD;  Location: [facility] INVASIVE CV LAB;  Service: Cardiovascular;  Laterality: N/A;   CARDIOVERSION N/A 04/11/2016   Procedure: CARDIOVERSION;  Surgeon: Maudine Sos, MD;  Location: [facility];  Service:  Cardiovascular;  Laterality: N/A;   COLONOSCOPY  03/13/2012   [place]--Dr. Spainhour   EP IMPLANTABLE DEVICE N/A 02/01/2016   Procedure: Pacemaker Implant;  Surgeon: Jolly Needle, MD;  Location: [facility] INVASIVE CV LAB;  Service: Cardiovascular;  Laterality: N/A;   ESOPHAGOGASTRODUODENOSCOPY  03/13/2012   [place], [place]--Dr. Spainhour   HERNIA REPAIR     2   KIDNEY STONE SURGERY     LEFT HEART CATHETERIZATION WITH CORONARY ANGIOGRAM N/A 08/20/2012   Procedure: LEFT HEART CATHETERIZATION WITH CORONARY ANGIOGRAM;  Surgeon: Eilleen Grates, MD;  Location: [facility] CATH LAB;  Service: Cardiovascular;  Laterality: N/A;   Left leg surgery  03/13/1977   SKIN CANCER EXCISION     TOTAL HIP ARTHROPLASTY Left 01/11/2022   Procedure: TOTAL HIP ARTHROPLASTY ANTERIOR APPROACH;  Surgeon: Adonica Hoose, MD;  Location: [facility] ORS;  Service: Orthopedics;  Laterality: Left;  150     Home Medications:  Prior to Admission medications   Medication Sig Start Date End Date Taking? Authorizing Provider  acetaminophen  (TYLENOL ) 325 MG tablet Take 650 mg by mouth daily as needed for headache.   Yes [provider]  cephALEXin (KEFLEX) 500 MG capsule  Take 500 mg by mouth once. Only for dental appointments 10/21/22  Yes [provider]  CRANBERRY PO Take 500 mg by mouth daily.   Yes [provider]  folic acid (FOLVITE) 1 MG tablet Take 1 mg by mouth daily.  NOT on Sunday d/t methotrexate 12/13/22  Yes [provider]  furosemide  (LASIX ) 20 MG tablet TAKE 1 TABLET(20 MG) BY MOUTH DAILY 07/26/23  Yes Mallipeddi, Vishnu P, MD  ipratropium (ATROVENT) 0.06 % nasal spray Place 1 spray into both nostrils daily as needed for rhinitis. 01/07/23  Yes [provider]  methotrexate (RHEUMATREX) 2.5 MG tablet Take 2.5 mg by mouth once a week. 6 pills weekly   Yes [provider]  mexiletine (MEXITIL) 150 MG capsule Take 150 mg by mouth 2 (two) times daily.   Yes [provider]   NITROSTAT  0.4 MG SL tablet DISSOLVE 1 TABLET UNDER TONGUE EVERY 5 MINUTES AS NEEDED FOR CHEST PAIN Patient taking differently: Place 0.4 mg under the tongue every 5 (five) minutes as needed for chest pain. 05/11/14  Yes Gerard Knight, MD  ondansetron  (ZOFRAN ) 4 MG tablet Take 4 mg by mouth every 8 (eight) hours as needed for nausea. 01/15/23  Yes [provider]  pantoprazole  (PROTONIX ) 40 MG tablet Take 40 mg by mouth every evening.   Yes [provider]  potassium chloride  SA (KLOR-CON  M) 20 MEQ tablet Take 20 mEq by mouth daily.   Yes [provider]  Probiotic Product (PROBIOTIC DAILY PO) Take 110 mg by mouth daily.   Yes [provider]  Rivaroxaban  (XARELTO ) 15 MG TABS tablet Take 15 mg by mouth daily in the afternoon.   Yes [provider]  rosuvastatin (CRESTOR) 5 MG tablet Take 5 mg by mouth every Sunday.   Yes [provider]  traMADol (ULTRAM) 50 MG tablet Take 50 mg by mouth daily as needed for moderate pain.   Yes [provider]    Inpatient Medications: Scheduled Meds:  folic acid  1 mg Oral Daily   [START ON 08/03/2023] methotrexate  2.5 mg Oral Weekly   mexiletine  150 mg Oral BID   pantoprazole   40 mg Oral QHS   potassium chloride  SA  40 mEq Oral Daily   Rivaroxaban   15 mg Oral Q supper   [START ON 08/05/2023] rosuvastatin  5 mg Oral Weekly   sodium bicarbonate  650 mg Oral BID   sodium chloride  flush  3 mL Intravenous Q12H   Continuous Infusions:  PRN Meds: acetaminophen  **OR** acetaminophen , hydrALAZINE , labetalol, polyethylene glycol  Allergies:    Allergies  Allergen Reactions   Amiodarone Other (See Comments)    Pt reports vision changes and walking changes    Flecainide  Other (See Comments)    Severe headaches, blurred vision   Carvedilol  Other (See Comments)    Unknown    Dapagliflozin Diarrhea   Eplerenone Other (See Comments)    Unknown    Flomax [Tamsulosin Hcl] Hives, Itching and  Rash   Lisinopril  Nausea And Vomiting   Metoprolol  Other (See Comments)    Headaches, dizziness   Sacubitril-Valsartan Nausea And Vomiting   Spironolactone  Swelling and Other (See Comments)    Breast swelling, headaches   Statins Other (See Comments)    Leg cramps   Sulfasalazine Nausea Only    Social History:   Social History   Socioeconomic History   Marital status: Married    Spouse name: Not on file   Number of children:  1   Years of education: Not on file   Highest education level: Not on file  Occupational History   Occupation: [employer]    Comment: Quarry manager  Tobacco Use   Smoking status: Never   Smokeless tobacco: Never  Vaping Use   Vaping status: Never Used  Substance and Sexual Activity   Alcohol  use: Yes    Alcohol /week: 2.0 standard drinks of alcohol     Types: 2 Cans of beer per week    Comment: Occasional beer   Drug use: No   Sexual activity: Yes    Partners: Female  Other Topics Concern   Not on file  Social History Narrative   Daily caffeine    Social Drivers of Health   Financial Resource Strain: Low Risk  (07/01/2021)   Received from [facility]   Overall Financial Resource Strain (CARDIA)  Food Insecurity: No Food Insecurity (01/11/2022)   Hunger Vital Sign    Worried About Running Out of Food in the Last Year: Never true    Ran Out of Food in the Last Year: Never true  Transportation Needs: No Transportation Needs (01/11/2022)   PRAPARE - Administrator, Civil Service (Medical): No    Lack of Transportation (Non-Medical): No  Physical Activity: Not on file  Stress: Not on file  Social Connections: Not on file  Intimate Partner Violence: Not At Risk (01/11/2022)   Humiliation, Afraid, Rape, and Kick questionnaire    Fear of Current or Ex-Partner: No    Emotionally Abused: No    Physically Abused: No    Sexually Abused: No    Family History:    Family History  Problem Relation Age of Onset    Colon cancer Mother    Other Sister         AGE-45-HEALTHY   Other Sister        AGE 81-HEALTHY   Other Sister        AGE 52 HEALTHY   Other Son        AGE 73 HEALTHY     ROS:  Please see the history of present illness.   All other ROS reviewed and negative.     Physical Exam/Data:   Vitals:   07/31/23 2149 08/01/23 0124 08/01/23 0521 08/01/23 0816  BP: (!) 128/97 128/89 123/83 127/68  Pulse: 70 69 70 63  Resp: 16 16 18    Temp: 98.2 F (36.8 C) 98.2 F (36.8 C) 97.9 F (36.6 C) 97.8 F (36.6 C)  TempSrc: Oral Oral Oral Oral  SpO2: 100% 100% 100% 100%  Weight:   50.8 kg   Height:        Intake/Output Summary (Last 24 hours) at 08/01/2023 0852 Last data filed at 08/01/2023 0815 Gross per 24 hour  Intake 243 ml  Output 1650 ml  Net -1407 ml      08/01/2023    5:21 AM 07/31/2023    6:23 PM 07/31/2023    1:54 PM  Last 3 Weights  Weight (lbs) 111 lb 15.9 oz 117 lb 8.1 oz 120 lb  Weight (kg) 50.8 kg 53.3 kg 54.432 kg     Body mass index is 17.03 kg/m.  General:  Well nourished, well developed, in no acute distress HEENT: normal Neck: no JVD Vascular: No carotid bruits; Distal pulses 2+ bilaterally Cardiac:  normal S1, S2; RRR; no murmur  Lungs:  clear to auscultation bilaterally, no wheezing, rhonchi or rales  Abd: soft, nontender, no hepatomegaly  Ext: no edema Musculoskeletal:  No deformities, BUE and BLE strength normal and equal Skin: warm and dry  Neuro:  CNs 2-12 intact, no focal abnormalities noted Psych:  Normal affect   EKG:  The EKG was personally reviewed and demonstrates:  V-paced rhythm , 100bpm, PVCs Telemetry:  Telemetry was personally reviewed and demonstrates:  V paced rhythm at the 70s with occasional increase to 100, PVCs  Relevant CV Studies:  Echo 01/2023 1. Left ventricular ejection fraction, by estimation, is 30 to 35%. The  left ventricle has moderately decreased function. The left ventricle  demonstrates global hypokinesis. Left  ventricular diastolic parameters are  indeterminate.   2. Right ventricular systolic function is normal. The right ventricular  size is normal. There is mildly elevated pulmonary artery systolic  pressure.   3. Left atrial size was mildly dilated.   4. Mild prolapse of the posterior MV leaflet. . The mitral valve is  abnormal. Moderate mitral valve regurgitation. No evidence of mitral  stenosis.   5. The tricuspid valve is abnormal.   6. The aortic valve is tricuspid. Aortic valve regurgitation is not  visualized. No aortic stenosis is present.   7. The inferior vena cava is normal in size with greater than 50%  respiratory variability, suggesting right atrial pressure of 3 mmHg.    TEE 06/2021 at [facility]  Pre TEE:    Normal LV size, reduced EF approx 30-35% by visual estimation, akinesis of the basal to mid papillary anteroseptal wall, otherwise globally hypokinetic, mild RV dilation and dysfunction, mod MR with P2 prolapse, trace TR, trace AI/PI, no AS, prior  iatrogenic ASD with flow predominantly in the L to R direction. No pericardial effusion, anterior fat pad noted       Post TEE:    Unchanged. No pericardial effusion   cMRI 03/2021 at [facility] 1. The left ventricle is normal in size. Wall thickness is normal. Global systolic function is moderately  reduced. The LVEF is calculated to be 32% (prior 60%). There is mild diffuse hypokinesis. Dyssynchronous  contraction of the septum, likely due to pacing. There are no other regional wall motion abnormalities.   2. The right ventricle is normal in size with moderately reduced function systolic function. the RVEF is  calculated to be 45%. There are no focal aneurysms or areas of wall thinning. The RV lead terminates in the  RV apex.   3. The left atrium is moderately enlarged. There is a lead in the RA appendage.   4. There is a mildly thickened mitral valve with myxomatous changes (but no significant prolapse) with  associated moderate  central mitral regurgitation. There is mild tricuspid valve regurgitation. No  significant aortic valvular disease.   5. Delayed enhancement imaging for viability is abnormal. There is a stripe of mid-myocardial  hyperenhancement in the basal anteroseptum (not significantly changed from prior 07/02/2008 cardiac MRI.  There is new basal to mid inferior and inferolateral and papillary muscle subendocardial hyperenhancement  suggestive of subendocardial myocardial in the RCA and or LCX distributions. Together, the hyperenhancement  accounts for ~ 4% total myocardial mass.   Final Impression:  New moderately reduced LV and mildly reduced RV function.  New LV inferior and inferolateral subendocardial  myocardial infarction. Moderate mitral regurgitation. Mild tricuspid regurgitation. New pacemaker leads.   cardiac cath 02/2021 (unsure where) LCx-20%, OM1-20%, pRCA-50%, Proximal Coronary artery calcifications, no targets for revascularizaton, recs for Med Mgmt (BB, Farxiga, ARB, diuretic), LVEDP-7, PCWP=14, Ray=24/15/19, RV=21/6, RA=6, CO=5.9, CI=3.5  Echo 02/2020 at [facility] Summary   1. Limited study to assess pericardial effusion.    2. There is no significant pericardial effusion.    3. The left ventricle is normal in size with normal wall thickness.    4. The left ventricular systolic function is mildly decreased, LVEF is  visually estimated at 45-50%.    5. The left atrium is dilated in size.    6. The right ventricle is not well visualized but probably normal in size,  with normal systolic function.    7. The right atrium is mildly dilated  in size.   Echo 2020 at [place] [facility] Summary   1. The left ventricle is normal in size with normal wall thickness.    2. Mildly decreased left ventricular systolic function, ejection fraction  45-50%.   3. Diastolic dysfunction - grade II (elevated filling pressures).    4. Mitral regurgitation - mild to moderate.    5. Mitral valve prolapse - Mild.    6.  Dilated left atrium - mildly dilated.    7. Normal right ventricular size and systolic function.    8. Tricuspid regurgitation - mild.   Echo 09/2017 at [facility] This result has an attachment that is not available.   Borderline left ventricular systolic function, ejection fraction 50%   Mitral regurgitation - mild   Mitral valve posterior leaflet prolapse - mild   Dilated left atrium - mild   Normal right ventricular systolic function   Tricuspid regurgitation - mild to moderate   TEE 06/2016 [facility]  Grossly normal LV systolic function   Mitral regurgitation - mild   No evidence of left or right atrial appendage thrombus   Tricuspid regurgitation - mild   Left Ventricle  Grossly normal LV systolic function   Left Atrium  There is no left atrial appendage thrombus present.   Right Atrium  There is no evidence of right atrial appendage thrombus.   Mitral Valve  The mitral valve leaflets are normal, with normal leaflet mobility.  There is mild mitral regurgitation by color Doppler imaging.   Tricuspid Valve  The tricuspid valve leaflets are normal with normal leaflet mobility.  There is mild tricuspid regurgitation by color Doppler imaging.   Aortic Valve  The aortic valve is trileaflet with normal excursion.  There is trivial aortic regurgitation by color Doppler imaging.   Pulmonic Valve  The pulmonic valve is normal.  There is no significant pulmonic regurgitation by color Doppler imaging.   Ascending Aorta  The aorta is normal in size in the visualized segments.   MPI 08/2016 at [place] [facility] Impressions:  - No significant ischemia is noted on perfusion imaging  - There is a small in size, subtle in severity, fixed defect involving the  apical septal and mid anteroseptal segments. This is likely due to LBBB.  - Post stress: Global systolic function is normal. The ejection fraction  calculated at 55%. Septal wall motion is consistent with BBB.  - Coronary calcifications are  noted  - Incidentally noted on the attenuation CT scan is a calcified nodule in  the right lung likely seconday to prior granulomatous disease  - Sensitivity and specificity of this test are reduced by the ICD or  pacemaker leads and paced rhythm   cMRI 06/2016 IMPRESSION:  Limited study due to excessive motion artifact.  - Left atrial enlargement.  - Mild tricuspid and mitral valve regurgitation.  - No abnormal myocardial enhancement identified.   Echo 08/2016 This result has an  attachment that is not available.   Preserved global left ventricular systolic function with grade I  diastolic dysfunction, with abnormal septal motion associated with right  ventricular pacing.   Mild mild posterior mitral leaflet prolapse, associated with mild  regurgitation.   Mildly dilated left atrium.   Normal right ventricular systolic function.   Mild to moderate tricuspid regurgitation, without evidence of  significant elevation of pulmonary artery systolic pressure.   Small left-to-right shunt at the atrial level, suggesting patent foramen  ovale or small atrial septal defect.   Pacemaker Implant 01/2016 PREPROCEDURE DIAGNOSIS:  Symptomatic Bradycardia due to longstanding persistent atrial fibrillation and second degree AV block    POSTPROCEDURE DIAGNOSIS:  Symptomatic Bradycardia due to longstanding persistent atrial fibrillation and second degree AV block  CONCLUSIONS:   1. Successful implantation of a Medtronic Advisa MRI conditional dual-chamber pacemaker for symptomatic bradycardia with His bundle pacing.  2. No early apparent complications.           Jolly Needle, MD 02/01/2016 4:32 PM     PROCEDURES:   1. Left upper extremity venography.   2. Pacemaker implantation.   Echo 12/2015 at [facility] Conclusions   - Left ventricle: The cavity size was normal. Systolic function was    mildly to moderately reduced. The estimated ejection fraction was    in the range of  40% to 45%. There is akinesis of the    apicalanterior myocardium. There is akinesis of the mid    anteroseptal and apical septal myocardium. The study was not    technically sufficient to allow evaluation of LV diastolic    dysfunction due to atrial fibrillation.  - Mitral valve: There was mild regurgitation.  - Left atrium: The atrium was mildly dilated.    LHC 12/2015 at [facility] RCA to Prox RCA lesion, 40 %stenosed. Ost 1st Diag to 1st Diag lesion, 60 %stenosed. 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 LV end diastolic pressure is moderately elevated. The left ventricular ejection fraction is less than 25% by visual estimate.   Cardiac Cath 2016 Nonobstructive disease   Cardiac Cath: 08/20/2012 Left mainstem: Normal  Left anterior descending (LAD): Proximal severe calcification with focal long 50% stenosis. Vessel is large and wraps the apex. D1 proximal and arising in the calcified area. This is a moderate sized vessel with ostial 50% stenosis.  Left circumflex (LCx): AV groove mild luminal irregularities. RI is small with ostial 30% stenosis. MOM large with luminal irregularities.  Right coronary artery (RCA): Large and dominant. Proximal and mid long calcification. Long proximal 30% stenosis. PDA small/moderate normal.  Left ventriculography: Left ventricular systolic function is normal, LVEF is estimated at 65%, there is no significant mitral regurgitation  Final Conclusions: Preserved EF. Nonobstructive CAD.  Laboratory Data:  High Sensitivity Troponin:   Recent Labs  Lab 07/31/23 1740  TROPONINIHS 19*     Chemistry Recent Labs  Lab 07/31/23 1440 07/31/23 1740 08/01/23 0402  NA 135  --  136  K 3.3*  --  3.6  CL 106  --  108  CO2 16*  --  18*  GLUCOSE 123*  --  101*  BUN 41*  --  44*  CREATININE 2.11*  --  2.28*  CALCIUM 8.5*  --  8.1*  MG  --  2.2  --   GFRNONAA 32*  --  29*  ANIONGAP 13  --  10    Recent Labs  Lab 08/01/23 0402   PROT 6.0*  ALBUMIN  3.3*  AST 21  ALT 32  ALKPHOS 42  BILITOT 1.2   Lipids No results for input(s): "CHOL", "TRIG", "HDL", "LABVLDL", "LDLCALC", "CHOLHDL" in the last 168 hours.  Hematology Recent Labs  Lab 07/31/23 1440 08/01/23 0402  WBC 7.7 6.5  RBC 4.34 4.29  HGB 14.8 14.3  HCT 44.9 42.3  MCV 103.5* 98.6  MCH 34.1* 33.3  MCHC 33.0 33.8  RDW 13.9 13.6  PLT 183 173   Thyroid  Recent Labs  Lab 08/01/23 0402  TSH 1.962    BNP Recent Labs  Lab 07/31/23 1440  BNP 1,699.0*    DDimer No results for input(s): "DDIMER" in the last 168 hours.   Radiology/Studies:  DG Chest 2 View Result Date: 07/31/2023 CLINICAL DATA:  Shortness of breath. EXAM: CHEST - 2 VIEW COMPARISON:  02/02/2016. FINDINGS: Bilateral lung fields are clear. Bilateral costophrenic angles are clear. Normal cardio-mediastinal silhouette. There is a left sided 3-lead pacemaker. No acute osseous abnormalities. The soft tissues are within normal limits. IMPRESSION: *No active cardiopulmonary disease. Electronically Signed   By: [name] M.D.   On: 07/31/2023 14:19     Assessment and Plan:   Acute on chronic systolic heart failure Nonischemic cardiomyopathy - long history of HFrEF dating back to 2017 with multiple cardiac catheterizations showing nonobstructive CAD - most recent echo 01/2023 showed LVEF 30-35% - patient failed outpatient diuresis and was instructed to go to the ER - In the ER BNP 1699 - he was given IV lasix  40mg  daily - Scr/Bun up and IV lasix  held - patient is feeling overall much better and appears euvolemic - has not tolerated GDMT:. spiro due to gynecomastia, did not tolerate eplerenone, coreg , metoprolol  or bisoprolol . He did not tolerate lisinopril  or Entresto due to vomiting - he has been referred to Advanced Heart failure in [place], which I agree with  Permanent Afib - patient has a h/o multiple afib ablations - at this point he is permanent Afib - he follows with  EP - Continue Xarelto  for stroke ppx  Symptomatic bradycardia S/p pacemaker in 2017 with device upgrade to CRT-D in 06/2021 - most recent interrogation showed BiV pacing 87.9% of the time, 2 episodes of NSVT - tele shows V pacing with rates 70s with occasional rates up to 100, PVCs noted - followed by EP  Hypertension - Bps are normal - he has been intolerant to multiple medications  Frequent PVCs - did not tolerate metoprolol , coreg  or bisoprolol  - on mexilitine 150mg  BID per EP  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 - no chest pain reported    For questions or updates, please contact [facility] Please consult [email or web address] for contact info under    Signed, Rene Gonsoulin Delle Ferdinand  08/01/2023 8:52 AM  Attending Note   Patient seen and discussed with Ray Gennaro Khat, I agree with her documentation.   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    WBC 7.7 Hgb 14.8 Plt 183 K 3.3 BUN 41 Cr 2.11 BNP 1699 Mg 2.2  Trop 19 EKG: A sensed, Vpaced CXR: no acute process  SOVAH  RHC/LHC in 2022 that showed calcified proximal vessels, 20% LCx, 20% OM1, 50% pRCA (with negative FFR), LVEDP 7, PCWP 14, Ray  24/15/19, RV 21/6, RA 6, CO 5.9, CI 3.5.  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  - diarrhea on SGLT2i - from notes  typically self discontinues meds due to reported side effects.  - in summary side effects to all  beta blocker, ARNI, ACEi, SGLT2i, MRAs. Could consider trial of hydral/imdur , its unclear to me if ARB by itself had been tried but with GFR of 29 would not start. He also is not in favor of trying new meds this admission.   -CXR no acute process, BNP 1699 - received IV lasix  40mg  yesterday and this AM. Negative 1.6 L thus far, slight uptrend in Cr with diuresis. Standing weight this AM 112 lbs, listed as 120 lbs 5/19 in clinic. Looks euvolemic by exam - check reds vest, ambulate patient. Likely can d/c later today - was only taking lasix  20mg  prn at home, roughly about once a month. Would plan for oral lasix  20mg  every other day at discharge. Arrange f/u with advanced HF clinic.   2.Afib - history of multiple prior ablations - on xarelto  for stroke prevention -primary cardiologist considering watchman referral as outpatient. Has had issues with bruising, nosebleeds, bleeding hemorroids.    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 On chronic Xarelto .    Armida Lander MD

## 2023-08-08 ENCOUNTER — Emergency Department (HOSPITAL_COMMUNITY)

## 2023-08-08 ENCOUNTER — Other Ambulatory Visit: Payer: Self-pay

## 2023-08-08 ENCOUNTER — Emergency Department (HOSPITAL_COMMUNITY)
Admission: EM | Admit: 2023-08-08 | Discharge: 2023-08-08 | Disposition: A | Discharge status: Home / Self Care | Attending: Emergency Medicine | Admitting: Emergency Medicine

## 2023-08-08 ENCOUNTER — Encounter (HOSPITAL_COMMUNITY): Payer: Self-pay

## 2023-08-08 DIAGNOSIS — R55 Syncope and collapse: Secondary | ICD-10-CM | POA: Insufficient documentation

## 2023-08-08 DIAGNOSIS — S0083XA Contusion of other part of head, initial encounter: Secondary | ICD-10-CM | POA: Insufficient documentation

## 2023-08-08 DIAGNOSIS — S51012A Laceration without foreign body of left elbow, initial encounter: Secondary | ICD-10-CM | POA: Insufficient documentation

## 2023-08-08 DIAGNOSIS — W01198A Fall on same level from slipping, tripping and stumbling with subsequent striking against other object, initial encounter: Secondary | ICD-10-CM | POA: Diagnosis not present

## 2023-08-08 DIAGNOSIS — Z7901 Long term (current) use of anticoagulants: Secondary | ICD-10-CM | POA: Diagnosis not present

## 2023-08-08 DIAGNOSIS — W19XXXA Unspecified fall, initial encounter: Secondary | ICD-10-CM

## 2023-08-08 DIAGNOSIS — S59902A Unspecified injury of left elbow, initial encounter: Secondary | ICD-10-CM | POA: Diagnosis present

## 2023-08-08 LAB — COMPREHENSIVE METABOLIC PANEL WITH GFR
ALT: 30 U/L (ref 0–44)
AST: 22 U/L (ref 15–41)
Albumin: 3.3 g/dL — ABNORMAL LOW (ref 3.5–5.0)
Alkaline Phosphatase: 49 U/L (ref 38–126)
Anion gap: 11 (ref 5–15)
BUN: 34 mg/dL — ABNORMAL HIGH (ref 8–23)
CO2: 16 mmol/L — ABNORMAL LOW (ref 22–32)
Calcium: 8.7 mg/dL — ABNORMAL LOW (ref 8.9–10.3)
Chloride: 110 mmol/L (ref 98–111)
Creatinine, Ser: 1.87 mg/dL — ABNORMAL HIGH (ref 0.61–1.24)
GFR, Estimated: 37 mL/min — ABNORMAL LOW (ref >60)
Glucose, Bld: 74 mg/dL (ref 70–99)
Potassium: 3.6 mmol/L (ref 3.5–5.1)
Sodium: 137 mmol/L (ref 135–145)
Total Bilirubin: 1 mg/dL (ref 0.0–1.2)
Total Protein: 6.1 g/dL — ABNORMAL LOW (ref 6.5–8.1)

## 2023-08-08 LAB — CBC WITH DIFFERENTIAL/PLATELET
Abs Immature Granulocytes: 0.05 10*3/uL (ref 0.00–0.07)
Basophils Absolute: 0 10*3/uL (ref 0.0–0.1)
Basophils Relative: 0 %
Eosinophils Absolute: 0 10*3/uL (ref 0.0–0.5)
Eosinophils Relative: 0 %
HCT: 43.1 % (ref 39.0–52.0)
Hemoglobin: 13.9 g/dL (ref 13.0–17.0)
Immature Granulocytes: 1 %
Lymphocytes Relative: 5 %
Lymphs Abs: 0.4 10*3/uL — ABNORMAL LOW (ref 0.7–4.0)
MCH: 33.2 pg (ref 26.0–34.0)
MCHC: 32.3 g/dL (ref 30.0–36.0)
MCV: 102.9 fL — ABNORMAL HIGH (ref 80.0–100.0)
Monocytes Absolute: 0.2 10*3/uL (ref 0.1–1.0)
Monocytes Relative: 3 %
Neutro Abs: 7.2 10*3/uL (ref 1.7–7.7)
Neutrophils Relative %: 91 %
Platelets: 155 10*3/uL (ref 150–400)
RBC: 4.19 MIL/uL — ABNORMAL LOW (ref 4.22–5.81)
RDW: 13.8 % (ref 11.5–15.5)
WBC: 7.9 10*3/uL (ref 4.0–10.5)
nRBC: 0 % (ref 0.0–0.2)

## 2023-08-08 LAB — CBG MONITORING, ED: Glucose-Capillary: 71 mg/dL (ref 70–99)

## 2023-08-08 MED ORDER — MORPHINE SULFATE (PF) 2 MG/ML IV SOLN: 2.0000 mg | Freq: Once | INTRAVENOUS | Status: DC

## 2023-08-08 MED ORDER — BACITRACIN ZINC 500 UNIT/GM EX OINT
TOPICAL_OINTMENT | CUTANEOUS | Status: AC
  Filled 2023-08-08: qty 0.9

## 2023-08-08 NOTE — ED Notes (Signed)
 Pt reports when [facility] transported him to the local hospital, they checked his blood sugar and found it was 36. Pt reports he did not eat this morning because he had to give scheduled blood work at his doctors office. Pts current CBG is 71 in Triage.

## 2023-08-08 NOTE — ED Provider Notes (Signed)
 [place] EMERGENCY DEPARTMENT AT [facility] Provider Note   CSN: [id number] Arrival date & time: 08/08/23  1422     History {Add pertinent medical, surgical, social history, OB history to HPI:1} Chief Complaint  Patient presents with   Brent Ray. is a 78 y.o. male.  Patient is on Eliquis for CVA.  He fell and hit his head and had loss of consciousness.  Patient with brought by paramedics today at 1 hospital but he did not want to be seen there so his wife brought him here.   Fall       Home Medications Prior to Admission medications   Medication Sig Start Date End Date Taking? Authorizing Provider  acetaminophen  (TYLENOL ) 325 MG tablet Take 650 mg by mouth daily as needed for headache.   Yes [provider]  cephALEXin (KEFLEX) 500 MG capsule Take 500 mg by mouth once. Only for dental appointments 10/21/22  Yes [provider]  CRANBERRY PO Take 500 mg by mouth daily.   Yes [provider]  folic acid (FOLVITE) 1 MG tablet Take 1 mg by mouth daily.  NOT on Sunday d/t methotrexate 12/13/22  Yes [provider]  furosemide  (LASIX ) 20 MG tablet Take 1 tablet (20 mg total) by mouth every other day. 08/02/23  Yes Wynetta Heckle, MD  ipratropium (ATROVENT) 0.06 % nasal spray Place 1 spray into both nostrils daily as needed for rhinitis. 01/07/23  Yes [provider]  methotrexate (RHEUMATREX) 2.5 MG tablet Take 2.5 mg by mouth once a week. 6 pills weekly   Yes [provider]  mexiletine (MEXITIL) 150 MG capsule Take 150 mg by mouth 2 (two) times daily.   Yes [provider]  NITROSTAT  0.4 MG SL tablet DISSOLVE 1 TABLET UNDER TONGUE EVERY 5 MINUTES AS NEEDED FOR CHEST PAIN Patient taking differently: Place 0.4 mg under the tongue every 5 (five) minutes as needed for chest pain. 05/11/14  Yes Gerard Knight, MD  ondansetron  (ZOFRAN ) 4 MG tablet Take 4 mg by mouth every 8 (eight) hours as  needed for nausea. 01/15/23  Yes [provider]  pantoprazole  (PROTONIX ) 40 MG tablet Take 40 mg by mouth every evening.   Yes [provider]  potassium chloride  SA (KLOR-CON  M) 20 MEQ tablet Take 20 mEq by mouth daily.   Yes [provider]  Probiotic Product (PROBIOTIC DAILY PO) Take 110 mg by mouth daily.   Yes [provider]  Rivaroxaban  (XARELTO ) 15 MG TABS tablet Take 15 mg by mouth daily in the afternoon.   Yes [provider]  rosuvastatin (CRESTOR) 5 MG tablet Take 5 mg by mouth every Sunday.   Yes [provider]  traMADol (ULTRAM) 50 MG tablet Take 50 mg by mouth daily as needed for moderate pain.   Yes [provider]      Allergies    Amiodarone, Flecainide , Carvedilol , Dapagliflozin, Eplerenone, Flomax [tamsulosin hcl], Lisinopril , Metoprolol , Sacubitril-valsartan, Spironolactone , Statins, and Sulfasalazine    Review of Systems   Review of Systems  Physical Exam Updated Vital Signs BP (!) 152/94 (BP Location: Right Arm)   Pulse 70   Temp (!) 97.3 F (36.3 C) (Temporal)   Resp 18   Ht 5\' 8"  (1.727 m)   Wt 50.8 kg   SpO2 100%   BMI 17.03 kg/m  Physical Exam  ED Results / Procedures / Treatments   Labs (all labs ordered are listed, but  only abnormal results are displayed) Labs Reviewed  CBC WITH DIFFERENTIAL/PLATELET - Abnormal; Notable for the following components:      Result Value   RBC 4.19 (*)    MCV 102.9 (*)    Lymphs Abs 0.4 (*)    All other components within normal limits  COMPREHENSIVE METABOLIC PANEL WITH GFR - Abnormal; Notable for the following components:   CO2 16 (*)    BUN 34 (*)    Creatinine, Ser 1.87 (*)    Calcium 8.7 (*)    Total Protein 6.1 (*)    Albumin  3.3 (*)    GFR, Estimated 37 (*)    All other components within normal limits  CBG MONITORING, ED    EKG None  Radiology CT Cervical Spine Wo Contrast Result Date: 08/08/2023 CLINICAL DATA:  Neck trauma (Age >=  65y) EXAM: CT CERVICAL SPINE WITHOUT CONTRAST TECHNIQUE: Multidetector CT imaging of the cervical spine was performed without intravenous contrast. Multiplanar CT image reconstructions were also generated. RADIATION DOSE REDUCTION: This exam was performed according to the departmental dose-optimization program which includes automated exposure control, adjustment of the mA and/or kV according to patient size and/or use of iterative reconstruction technique. COMPARISON:  None Available. FINDINGS: Alignment: Straightening of normal lordosis. No traumatic subluxation. Skull base and vertebrae: No acute fracture. Vertebral body heights are maintained. Schmorl's node within inferior C7. The dens and skull base are intact. Soft tissues and spinal canal: No prevertebral fluid or swelling. No visible canal hematoma. Disc levels: Disc space narrowing and anterior spurring C4-C5, C5-C6, C6-C7 and C7-T1. Upper chest: No acute findings. Biapical pleuroparenchymal scarring. Other: Carotid calcifications. IMPRESSION: 1. No acute fracture or subluxation of the cervical spine. 2. Multilevel degenerative disc disease. Electronically Signed   By: Chadwick Colonel M.D.   On: 08/08/2023 17:33   DG Elbow 2 Views Left Result Date: 08/08/2023 CLINICAL DATA:  Pain after fall. EXAM: LEFT ELBOW - 2 VIEW COMPARISON:  None Available. FINDINGS: There is no evidence of fracture or dislocation. Right elevated anterior fat pad but no posterior fat pad elevation. There is no evidence of arthropathy or other focal bone abnormality. Soft tissues are unremarkable. IMPRESSION: 1. Possible small joint effusion. 2. No evidence of fracture or dislocation. If symptoms persist, recommend follow-up radiographs in 7-10 days. Electronically Signed   By: Chadwick Colonel M.D.   On: 08/08/2023 17:26   CT Head Wo Contrast Result Date: 08/08/2023 CLINICAL DATA:  Head trauma, minor (Age >= 65y) EXAM: CT HEAD WITHOUT CONTRAST TECHNIQUE: Contiguous axial images  were obtained from the base of the skull through the vertex without intravenous contrast. RADIATION DOSE REDUCTION: This exam was performed according to the departmental dose-optimization program which includes automated exposure control, adjustment of the mA and/or kV according to patient size and/or use of iterative reconstruction technique. COMPARISON:  None Available. FINDINGS: Brain: No intracranial hemorrhage, mass effect, or midline shift. Generalized atrophy. No hydrocephalus. The basilar cisterns are patent. Perivascular space versus remote lacunar infarct in the right basal ganglia. Remote right cerebellar lacunar infarct. No evidence of territorial infarct or acute ischemia. No extra-axial or intracranial fluid collection. Vascular: Atherosclerosis of skullbase vasculature without hyperdense vessel or abnormal calcification. Skull: No fracture or focal lesion. Sinuses/Orbits: Paranasal sinuses and mastoid air cells are clear. The visualized orbits are unremarkable. Other: No confluent scalp hematoma. IMPRESSION: 1. No acute intracranial abnormality. No skull fracture. 2. Generalized atrophy. Remote right cerebellar lacunar infarct. Electronically Signed   By: Alvina Axon.D.  On: 08/08/2023 17:24    Procedures Procedures  {Document cardiac monitor, telemetry assessment procedure when appropriate:1}  Medications Ordered in ED Medications  bacitracin 500 UNIT/GM ointment (  Given 08/08/23 1759)    ED Course/ Medical Decision Making/ A&P   {   Click here for ABCD2, HEART and other calculatorsREFRESH Note before signing :1}                              Medical Decision Making Amount and/or Complexity of Data Reviewed Labs: ordered. Radiology: ordered.  Patient has a contusion and abrasion to forehead and skin tear to left forearm.  {Document critical care time when appropriate:1} {Document review of labs and clinical decision tools ie heart score, Chads2Vasc2 etc:1}  {Document  your independent review of radiology images, and any outside records:1} {Document your discussion with family members, caretakers, and with consultants:1} {Document social determinants of health affecting pt's care:1} {Document your decision making why or why not admission, treatments were needed:1} Final Clinical Impression(s) / ED Diagnoses Final diagnoses:  Fall, initial encounter    Rx / DC Orders ED Discharge Orders     None

## 2023-08-08 NOTE — Discharge Instructions (Signed)
 Take Tylenol  for pain and follow-up with your family doctor in 2 days for recheck of your wound

## 2023-08-08 NOTE — ED Triage Notes (Signed)
 Pt arrived via POV from [facility] via POV after EMS brought him there for evaluation. Pt is taking Xerelto. Per Pts spouse, they were shopping at [facility] and Pt tripped over a rug leaving the store and lost consciousness hitting his head on the floor. Pts spouse reports she ran inside to call for help, and Pt was on the ground unresponsive for apprx 5 minutes. Pt present with skin tears and abrasions and is wearing a C-Collar.

## 2023-08-12 DIAGNOSIS — G459 Transient cerebral ischemic attack, unspecified: Secondary | ICD-10-CM

## 2023-08-12 HISTORY — DX: Transient cerebral ischemic attack, unspecified: G45.9

## 2023-08-20 NOTE — Progress Notes (Signed)
   ADVANCED HEART FAILURE CLINIC NOTE  Referring Physician: Meade Bigness, MD  Primary Care: Meade Bigness, MD Primary Cardiologist:  CC: Chronic systolic heart failure  HPI: Brent Ray. is a 78 y.o. 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  Brent Ray presents today with his wife.  According to him he has had a significant decline in functional status over the past 1 year.  He can perform all ADLs independently however becomes quickly short of breath and fatigued with moderate exertion.  He walked from [place] to our clinic today which led to significant fatigue and distress for him.  Unfortunately he reports intolerance to almost every class of guideline directed medical therapy available.   PHYSICAL EXAM: Vitals:   08/21/23 1135  BP: 138/68  Pulse: 73  SpO2: 100%   GENERAL: NAD Lungs- CTA CARDIAC:  JVP: 10, difficult to visualize          Normal rate with regular rhythm. systolic murmur.  Pulses 2+. +2 pitting edema.  ABDOMEN: Soft, non-tender, non-distended.  EXTREMITIES: Warm and well perfused.  NEUROLOGIC: No obvious FND   DATA REVIEW  ECG: 08/21/23: atrial fibrillation with BiV pacing.  as per my personal interpretation  ECHO: 01/29/23: LVEF 30-35%, normal RV function, mod MR.  As per my personal interpretation   ASSESSMENT & PLAN:  Heart failure with reduced ejection fraction - Etiology: Nonischemic cardiomyopathy - NYHA Class: NYHA III - Volume status: Hypervolemic, increase Lasix  to 40 mg for the next 3 days and then back down to 20 mg daily - ACEi/ARB/ARNI: Intolerant.  After lengthy discussion he is open to  trying 12.5 mg nightly - Aldosterone antagonist: Intolerant - Beta-blocker: Intolerant - Digoxin: Intolerant - Hydralazine /Nitrates: Intolerant - SGLT2i: Intolerant - Advanced therapies: not a candidate.  - ICD: CRT-D in place. BiV pacing at 85% in 02/2023. We will refer him to EP. Unable to get device report today due to wrong birth date in the [employer] system. Following with Dr. Nancey on 10/12/23.  - I had an extensive discussion with him today regarding his medication intolerances.  He is unable to tolerate ARB's, Arni's, beta-blockers, SGLT2 inhibitors and essentially any afterload reduction.  I explained to him that his mortality remains significantly high without any GDMT.  After lengthy discussion he is open to trying losartan  12.5 mg nightly but no other med changes at this time.  2.  Permanent atrial fibrillation - Status post multiple ablations - Currently on mexelitine 150mg   3. CKD - Baseline sCr over the past several months 1.8-2.4 - sCr 1.86 on on 08/14/23, reviewed outside records.   I spent 60 minutes caring for this patient today including face to face time, ordering and reviewing labs, reviewing records noted above, seeing the patient, extensive discussion on his multiple med intolerances and need for some GDMT, documenting in the record, and arranging follow ups.   Brent Ray Advanced Heart Failure Mechanical Circulatory Support

## 2023-08-21 ENCOUNTER — Emergency Department (HOSPITAL_COMMUNITY)

## 2023-08-21 ENCOUNTER — Inpatient Hospital Stay (HOSPITAL_COMMUNITY)
Admission: EM | Admit: 2023-08-21 | Discharge: 2023-08-29 | DRG: 064 | Disposition: A | Discharge status: Home / Self Care | Attending: Internal Medicine | Admitting: Internal Medicine

## 2023-08-21 ENCOUNTER — Other Ambulatory Visit: Payer: Self-pay

## 2023-08-21 ENCOUNTER — Encounter (HOSPITAL_COMMUNITY): Payer: Self-pay

## 2023-08-21 ENCOUNTER — Ambulatory Visit (HOSPITAL_BASED_OUTPATIENT_CLINIC_OR_DEPARTMENT_OTHER)
Admission: RE | Admit: 2023-08-21 | Discharge: 2023-08-21 | Disposition: A | Discharge status: Home / Self Care | Source: Ambulatory Visit | Attending: Cardiology | Admitting: Cardiology

## 2023-08-21 ENCOUNTER — Other Ambulatory Visit (HOSPITAL_COMMUNITY): Payer: Self-pay

## 2023-08-21 ENCOUNTER — Encounter (HOSPITAL_COMMUNITY): Payer: Self-pay | Admitting: Cardiology

## 2023-08-21 VITALS — BP 138/68 | HR 73 | Ht 68.0 in | Wt 123.4 lb

## 2023-08-21 DIAGNOSIS — K219 Gastro-esophageal reflux disease without esophagitis: Secondary | ICD-10-CM | POA: Diagnosis present

## 2023-08-21 DIAGNOSIS — Z79631 Long term (current) use of antimetabolite agent: Secondary | ICD-10-CM

## 2023-08-21 DIAGNOSIS — Z7901 Long term (current) use of anticoagulants: Secondary | ICD-10-CM

## 2023-08-21 DIAGNOSIS — R29706 NIHSS score 6: Secondary | ICD-10-CM | POA: Diagnosis present

## 2023-08-21 DIAGNOSIS — N1832 Chronic kidney disease, stage 3b: Secondary | ICD-10-CM | POA: Diagnosis not present

## 2023-08-21 DIAGNOSIS — E782 Mixed hyperlipidemia: Secondary | ICD-10-CM | POA: Diagnosis present

## 2023-08-21 DIAGNOSIS — I7771 Dissection of carotid artery: Secondary | ICD-10-CM | POA: Diagnosis present

## 2023-08-21 DIAGNOSIS — I340 Nonrheumatic mitral (valve) insufficiency: Secondary | ICD-10-CM | POA: Diagnosis present

## 2023-08-21 DIAGNOSIS — H53459 Other localized visual field defect, unspecified eye: Secondary | ICD-10-CM | POA: Diagnosis present

## 2023-08-21 DIAGNOSIS — Z882 Allergy status to sulfonamides status: Secondary | ICD-10-CM

## 2023-08-21 DIAGNOSIS — I428 Other cardiomyopathies: Secondary | ICD-10-CM | POA: Diagnosis present

## 2023-08-21 DIAGNOSIS — Z800 Family history of malignant neoplasm of digestive organs: Secondary | ICD-10-CM

## 2023-08-21 DIAGNOSIS — I5022 Chronic systolic (congestive) heart failure: Secondary | ICD-10-CM | POA: Diagnosis not present

## 2023-08-21 DIAGNOSIS — Z96642 Presence of left artificial hip joint: Secondary | ICD-10-CM | POA: Diagnosis present

## 2023-08-21 DIAGNOSIS — Z85828 Personal history of other malignant neoplasm of skin: Secondary | ICD-10-CM

## 2023-08-21 DIAGNOSIS — I4892 Unspecified atrial flutter: Secondary | ICD-10-CM | POA: Diagnosis present

## 2023-08-21 DIAGNOSIS — I10 Essential (primary) hypertension: Secondary | ICD-10-CM | POA: Diagnosis present

## 2023-08-21 DIAGNOSIS — W19XXXA Unspecified fall, initial encounter: Secondary | ICD-10-CM | POA: Diagnosis present

## 2023-08-21 DIAGNOSIS — M069 Rheumatoid arthritis, unspecified: Secondary | ICD-10-CM | POA: Diagnosis present

## 2023-08-21 DIAGNOSIS — R29700 NIHSS score 0: Secondary | ICD-10-CM | POA: Diagnosis present

## 2023-08-21 DIAGNOSIS — I6522 Occlusion and stenosis of left carotid artery: Principal | ICD-10-CM | POA: Diagnosis present

## 2023-08-21 DIAGNOSIS — Z86718 Personal history of other venous thrombosis and embolism: Secondary | ICD-10-CM

## 2023-08-21 DIAGNOSIS — I493 Ventricular premature depolarization: Secondary | ICD-10-CM | POA: Diagnosis present

## 2023-08-21 DIAGNOSIS — I63532 Cerebral infarction due to unspecified occlusion or stenosis of left posterior cerebral artery: Secondary | ICD-10-CM | POA: Diagnosis not present

## 2023-08-21 DIAGNOSIS — R2990 Unspecified symptoms and signs involving the nervous system: Secondary | ICD-10-CM

## 2023-08-21 DIAGNOSIS — K649 Unspecified hemorrhoids: Secondary | ICD-10-CM | POA: Diagnosis present

## 2023-08-21 DIAGNOSIS — I440 Atrioventricular block, first degree: Secondary | ICD-10-CM | POA: Diagnosis present

## 2023-08-21 DIAGNOSIS — R001 Bradycardia, unspecified: Secondary | ICD-10-CM | POA: Diagnosis present

## 2023-08-21 DIAGNOSIS — I071 Rheumatic tricuspid insufficiency: Secondary | ICD-10-CM | POA: Diagnosis present

## 2023-08-21 DIAGNOSIS — G453 Amaurosis fugax: Secondary | ICD-10-CM | POA: Diagnosis present

## 2023-08-21 DIAGNOSIS — Z950 Presence of cardiac pacemaker: Secondary | ICD-10-CM

## 2023-08-21 DIAGNOSIS — G43909 Migraine, unspecified, not intractable, without status migrainosus: Secondary | ICD-10-CM | POA: Diagnosis not present

## 2023-08-21 DIAGNOSIS — E1122 Type 2 diabetes mellitus with diabetic chronic kidney disease: Secondary | ICD-10-CM | POA: Diagnosis present

## 2023-08-21 DIAGNOSIS — Z7982 Long term (current) use of aspirin: Secondary | ICD-10-CM

## 2023-08-21 DIAGNOSIS — I2510 Atherosclerotic heart disease of native coronary artery without angina pectoris: Secondary | ICD-10-CM | POA: Diagnosis present

## 2023-08-21 DIAGNOSIS — I4821 Permanent atrial fibrillation: Secondary | ICD-10-CM | POA: Diagnosis present

## 2023-08-21 DIAGNOSIS — K2270 Barrett's esophagus without dysplasia: Secondary | ICD-10-CM | POA: Diagnosis present

## 2023-08-21 DIAGNOSIS — Z8673 Personal history of transient ischemic attack (TIA), and cerebral infarction without residual deficits: Secondary | ICD-10-CM

## 2023-08-21 DIAGNOSIS — S60211A Contusion of right wrist, initial encounter: Secondary | ICD-10-CM | POA: Diagnosis present

## 2023-08-21 DIAGNOSIS — R040 Epistaxis: Secondary | ICD-10-CM | POA: Diagnosis present

## 2023-08-21 DIAGNOSIS — R4701 Aphasia: Secondary | ICD-10-CM | POA: Diagnosis present

## 2023-08-21 DIAGNOSIS — H539 Unspecified visual disturbance: Secondary | ICD-10-CM

## 2023-08-21 DIAGNOSIS — R42 Dizziness and giddiness: Principal | ICD-10-CM

## 2023-08-21 DIAGNOSIS — N183 Chronic kidney disease, stage 3 unspecified: Secondary | ICD-10-CM | POA: Diagnosis present

## 2023-08-21 DIAGNOSIS — R112 Nausea with vomiting, unspecified: Secondary | ICD-10-CM | POA: Diagnosis present

## 2023-08-21 DIAGNOSIS — D649 Anemia, unspecified: Secondary | ICD-10-CM | POA: Diagnosis present

## 2023-08-21 DIAGNOSIS — Z888 Allergy status to other drugs, medicaments and biological substances status: Secondary | ICD-10-CM

## 2023-08-21 DIAGNOSIS — Z79899 Other long term (current) drug therapy: Secondary | ICD-10-CM

## 2023-08-21 DIAGNOSIS — I130 Hypertensive heart and chronic kidney disease with heart failure and stage 1 through stage 4 chronic kidney disease, or unspecified chronic kidney disease: Secondary | ICD-10-CM | POA: Diagnosis present

## 2023-08-21 DIAGNOSIS — Z87442 Personal history of urinary calculi: Secondary | ICD-10-CM

## 2023-08-21 DIAGNOSIS — G8191 Hemiplegia, unspecified affecting right dominant side: Secondary | ICD-10-CM | POA: Diagnosis present

## 2023-08-21 LAB — CBC
HCT: 34 % — ABNORMAL LOW (ref 39.0–52.0)
HCT: 36.5 % — ABNORMAL LOW (ref 39.0–52.0)
Hemoglobin: 11.7 g/dL — ABNORMAL LOW (ref 13.0–17.0)
Hemoglobin: 12.4 g/dL — ABNORMAL LOW (ref 13.0–17.0)
MCH: 34.2 pg — ABNORMAL HIGH (ref 26.0–34.0)
MCH: 34.3 pg — ABNORMAL HIGH (ref 26.0–34.0)
MCHC: 34.4 g/dL (ref 30.0–36.0)
MCHC: 34 g/dL (ref 30.0–36.0)
MCV: 99.4 fL (ref 80.0–100.0)
MCV: 101.1 fL — ABNORMAL HIGH (ref 80.0–100.0)
Platelets: 237 10*3/uL (ref 150–400)
Platelets: 264 10*3/uL (ref 150–400)
RBC: 3.42 MIL/uL — ABNORMAL LOW (ref 4.22–5.81)
RBC: 3.61 MIL/uL — ABNORMAL LOW (ref 4.22–5.81)
RDW: 14.8 % (ref 11.5–15.5)
RDW: 14.9 % (ref 11.5–15.5)
WBC: 6 10*3/uL (ref 4.0–10.5)
WBC: 7 10*3/uL (ref 4.0–10.5)
nRBC: 0 % (ref 0.0–0.2)
nRBC: 0.3 % — ABNORMAL HIGH (ref 0.0–0.2)

## 2023-08-21 LAB — DIFFERENTIAL
Abs Immature Granulocytes: 0 10*3/uL (ref 0.00–0.07)
Basophils Absolute: 0.1 10*3/uL (ref 0.0–0.1)
Basophils Relative: 1 %
Eosinophils Absolute: 0.1 10*3/uL (ref 0.0–0.5)
Eosinophils Relative: 1 %
Lymphocytes Relative: 13 %
Lymphs Abs: 0.9 10*3/uL (ref 0.7–4.0)
Monocytes Absolute: 0.2 10*3/uL (ref 0.1–1.0)
Monocytes Relative: 3 %
Neutro Abs: 5.7 10*3/uL (ref 1.7–7.7)
Neutrophils Relative %: 82 %

## 2023-08-21 LAB — BASIC METABOLIC PANEL WITH GFR
Anion gap: 7 (ref 5–15)
BUN: 28 mg/dL — ABNORMAL HIGH (ref 8–23)
CO2: 19 mmol/L — ABNORMAL LOW (ref 22–32)
Calcium: 8.8 mg/dL — ABNORMAL LOW (ref 8.9–10.3)
Chloride: 114 mmol/L — ABNORMAL HIGH (ref 98–111)
Creatinine, Ser: 1.84 mg/dL — ABNORMAL HIGH (ref 0.61–1.24)
GFR, Estimated: 38 mL/min — ABNORMAL LOW (ref >60)
Glucose, Bld: 87 mg/dL (ref 70–99)
Potassium: 3.9 mmol/L (ref 3.5–5.1)
Sodium: 140 mmol/L (ref 135–145)

## 2023-08-21 LAB — COMPREHENSIVE METABOLIC PANEL WITH GFR
ALT: 33 U/L (ref 0–44)
AST: 37 U/L (ref 15–41)
Albumin: 3.5 g/dL (ref 3.5–5.0)
Alkaline Phosphatase: 57 U/L (ref 38–126)
Anion gap: 11 (ref 5–15)
BUN: 32 mg/dL — ABNORMAL HIGH (ref 8–23)
CO2: 15 mmol/L — ABNORMAL LOW (ref 22–32)
Calcium: 8.8 mg/dL — ABNORMAL LOW (ref 8.9–10.3)
Chloride: 114 mmol/L — ABNORMAL HIGH (ref 98–111)
Creatinine, Ser: 2.12 mg/dL — ABNORMAL HIGH (ref 0.61–1.24)
GFR, Estimated: 32 mL/min — ABNORMAL LOW (ref >60)
Glucose, Bld: 128 mg/dL — ABNORMAL HIGH (ref 70–99)
Potassium: 3.4 mmol/L — ABNORMAL LOW (ref 3.5–5.1)
Sodium: 140 mmol/L (ref 135–145)
Total Bilirubin: 1.3 mg/dL — ABNORMAL HIGH (ref 0.0–1.2)
Total Protein: 6.4 g/dL — ABNORMAL LOW (ref 6.5–8.1)

## 2023-08-21 LAB — RAPID URINE DRUG SCREEN, HOSP PERFORMED
Amphetamines: NOT DETECTED
Barbiturates: NOT DETECTED
Benzodiazepines: NOT DETECTED
Cocaine: NOT DETECTED
Opiates: NOT DETECTED
Tetrahydrocannabinol: NOT DETECTED

## 2023-08-21 LAB — ETHANOL: Alcohol, Ethyl (B): <15 mg/dL (ref <15)

## 2023-08-21 MED ORDER — FUROSEMIDE 20 MG PO TABS: 20.0000 mg | ORAL_TABLET | Freq: Every day | ORAL | 3 refills | Status: DC

## 2023-08-21 MED ORDER — LOSARTAN POTASSIUM 25 MG PO TABS: 12.5000 mg | ORAL_TABLET | Freq: Every day | ORAL | 3 refills | Status: DC

## 2023-08-21 MED ORDER — ACETAMINOPHEN 160 MG/5ML PO SOLN: 650.0000 mg | ORAL | Status: DC | PRN

## 2023-08-21 MED ORDER — METOCLOPRAMIDE HCL 5 MG/ML IJ SOLN
10.0000 mg | INTRAMUSCULAR | Status: AC
  Administered 2023-08-21: 10 mg via INTRAVENOUS
  Filled 2023-08-21 (×2): qty 2

## 2023-08-21 MED ORDER — HYDRALAZINE HCL 20 MG/ML IJ SOLN: 10.0000 mg | INTRAMUSCULAR | Status: DC | PRN

## 2023-08-21 MED ORDER — ONDANSETRON HCL 4 MG/2ML IJ SOLN
4.0000 mg | Freq: Once | INTRAMUSCULAR | Status: AC
  Administered 2023-08-21: 4 mg via INTRAVENOUS
  Filled 2023-08-21: qty 2

## 2023-08-21 MED ORDER — ACETAMINOPHEN 650 MG RE SUPP: 650.0000 mg | RECTAL | Status: DC | PRN

## 2023-08-21 MED ORDER — ROSUVASTATIN CALCIUM 5 MG PO TABS
5.0000 mg | ORAL_TABLET | ORAL | Status: DC
  Administered 2023-08-26: 5 mg via ORAL
  Filled 2023-08-21: qty 1

## 2023-08-21 MED ORDER — ACETAMINOPHEN 325 MG PO TABS
650.0000 mg | ORAL_TABLET | ORAL | Status: DC | PRN
  Administered 2023-08-24: 650 mg via ORAL
  Filled 2023-08-21: qty 2

## 2023-08-21 MED ORDER — DIPHENHYDRAMINE HCL 50 MG/ML IJ SOLN
12.5000 mg | Freq: Once | INTRAMUSCULAR | Status: AC
  Administered 2023-08-21: 12.5 mg via INTRAVENOUS
  Filled 2023-08-21: qty 1

## 2023-08-21 MED ORDER — TETRACAINE HCL 0.5 % OP SOLN
1.0000 [drp] | Freq: Once | OPHTHALMIC | Status: DC
  Filled 2023-08-21: qty 4

## 2023-08-21 MED ORDER — MEXILETINE HCL 150 MG PO CAPS
150.0000 mg | ORAL_CAPSULE | Freq: Two times a day (BID) | ORAL | Status: DC
  Administered 2023-08-22 – 2023-08-28 (×14): 150 mg via ORAL
  Filled 2023-08-21 (×18): qty 1

## 2023-08-21 MED ORDER — SENNOSIDES-DOCUSATE SODIUM 8.6-50 MG PO TABS: 1.0000 | ORAL_TABLET | Freq: Every evening | ORAL | Status: DC | PRN

## 2023-08-21 MED ORDER — POTASSIUM CHLORIDE CRYS ER 20 MEQ PO TBCR
40.0000 meq | EXTENDED_RELEASE_TABLET | Freq: Once | ORAL | Status: AC
  Administered 2023-08-22: 40 meq via ORAL
  Filled 2023-08-21: qty 2

## 2023-08-21 MED ORDER — PANTOPRAZOLE SODIUM 40 MG PO TBEC
40.0000 mg | DELAYED_RELEASE_TABLET | Freq: Every evening | ORAL | Status: DC
  Administered 2023-08-22 – 2023-08-28 (×7): 40 mg via ORAL
  Filled 2023-08-21 (×7): qty 1

## 2023-08-21 MED ORDER — IOHEXOL 350 MG/ML SOLN
32.0000 mL | Freq: Once | INTRAVENOUS | Status: AC | PRN
  Administered 2023-08-21: 32 mL via INTRAVENOUS

## 2023-08-21 MED ORDER — IOHEXOL 350 MG/ML SOLN
60.0000 mL | Freq: Once | INTRAVENOUS | Status: AC | PRN
  Administered 2023-08-21: 60 mL via INTRAVENOUS

## 2023-08-21 MED ORDER — FLUORESCEIN SODIUM 1 MG OP STRP
1.0000 | ORAL_STRIP | Freq: Once | OPHTHALMIC | Status: DC
  Filled 2023-08-21: qty 1

## 2023-08-21 MED ORDER — RIVAROXABAN 15 MG PO TABS
15.0000 mg | ORAL_TABLET | Freq: Every day | ORAL | Status: DC
  Administered 2023-08-22 (×2): 15 mg via ORAL
  Filled 2023-08-21 (×3): qty 1

## 2023-08-21 MED ORDER — STROKE: EARLY STAGES OF RECOVERY BOOK
Freq: Once | Status: DC
  Filled 2023-08-21: qty 1

## 2023-08-21 NOTE — Assessment & Plan Note (Signed)
 Unilateral left lower extremity swelling-patient has had surgery on this leg.  Been compliant with Xarelto .   Last echo 01/2023 EF of 30 to 35%. - resume Lasix  20 mg Daily

## 2023-08-21 NOTE — ED Notes (Signed)
 MRI called, said pt's pacemaker not conducive to have MRI at [facility], but if EDP wanted then it could be done at [facility].

## 2023-08-21 NOTE — Consult Note (Addendum)
 [facility] Telemedicine Consult   Requesting Provider: Sherrilee Doles Consult Participants: bedside nurse, telestroke nurse Location of the provider: [place]  Location of the patient: [facility]  This consult was provided via telemedicine with 2-way video and audio communication. The patient/family was informed that care would be provided in this way and agreed to receive care in this manner.    Chief Complaint: Dizziness  HPI: 78 year old male with a history of headaches that are associated with nausea and spots in his vision who presents with acute onset of dizziness and spot which he felt was on the left side of his vision.  He states that he has had similar episodes before, but this one is worse than he has ever had and therefore he was concerned and presented to the emergency department.  He also is currently having nausea.  He has a hard time describing the dizziness, but states that it is not a room spinning type situation, but rather more of a lightheadedness.  He denies any numbness or weakness.    LKW: 1610 tnk given?: No, anticoagulated IR Thrombectomy? No, no LVO Time of teleneurologist evaluation: 17:25(Tried to log in at 17:20, but the [place] cart was down)  Exam: Vitals:   08/21/23 1652  BP: (!) 188/102  Pulse: 71  Resp: 15  Temp: 98 F (36.7 C)  SpO2: 100%    General: in bed, appears uncomfortable  1A: Level of Consciousness - 0 1B: Ask Month and Age - 0 1C: 'Blink Eyes' & 'Squeeze Hands' - 0 2: Test Horizontal Extraocular Movements - 0 3: Test Visual Fields - 0 4: Test Facial Palsy - 0(initially kept left eye closed, but on prompting, he has symmetric smile and symmetric eye opening) 5A: Test Left Arm Motor Drift - 0 5B: Test Right Arm Motor Drift - 0 6A: Test Left Leg Motor Drift - 0 6B: Test Right Leg Motor Drift - 0 7: Test Limb Ataxia - 0(he performs very slowly in bilateral legs, but wihtout clear ataxia) 8:  Test Sensation - 0 9: Test Language/Aphasia- 0 10: Test Dysarthria - 0 11: Test Extinction/Inattention - 0 NIHSS score: 0   Imaging Reviewed: CT-negative, CTA-no LVO  Labs reviewed in epic and pertinent values follow: Creatinine of two (near baseline)    Assessment: 78 year old male with transient visual scotoma, lightheadedness, unilateral severe headache, nausea.  With a history of headaches that cause nausea and visual scotoma, my suspicion is that this represents migraine.  Given that he does report that this is significantly more severe than typical, and it is unusual for him, if he does not improve with typical migraine treatment, could consider transfer for MRI brain but I think this will be relatively low yield.  Recommendations:  I initially recommended CT/CTA which were performed at the time of finalizing this note Migraine cocktail-could consider Reglan  or Compazine, Depacon 1 g, and/or magnesium 1 g If he remains severely symptomatic, then may need to consider transfer to an institution that can do MRI with pacemakers. If his symptoms abate, then no further workup is needed.  Ann Keto, MD [facility] [phone]  If 7pm- 7am, please page neurology on call as listed in AMION.  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 This could be chronic or acute, but with his low NIH, I would not favor emergent revascularization at this time. We will get CT perfusion and have him admitted to the floor at [facility] for closer monitoring.  If he were to worsen in the meantime, please reach out to on call neurology at [facility].   Ann Keto, MD [facility]   If 7pm- 7am, please page neurology on call as listed in AMION.

## 2023-08-21 NOTE — Patient Instructions (Signed)
 Medication Changes:  TAKE LASIX  (FUROSEMIDE ) 40MG  DAILY FOR THE NEXT 3 DAYS   THEN RETURN TO 20MG  ONCE DAILY   START: LOSARTAN 12.5 ONCE DAILY AT BEDTIME   Lab Work:  Labs done today, your results will be available in MyChart, we will contact you for abnormal readings.  Testing/Procedures:  ECHOCARDIOGRAM AS SCHEDULED   Special Instructions // Education:   [facility] [address] [place] [address] Dept: [phone] Loc: [phone]  Kaidyn Javid.  08/21/2023  You are scheduled for a Cardiac Catheterization on Tuesday, July 1 with Dr. Alwin Baars.  1. Please arrive at the [facility] (Main Entrance A) at [facility]: [address] [place], [place] [address] at 5:30 AM (This time is 2 hour(s) before your procedure to ensure your preparation).   Free valet parking service is available. You will check in at ADMITTING. The support person will be asked to wait in the waiting room.  It is OK to have someone drop you off and come back when you are ready to be discharged.    Special note: Every effort is made to have your procedure done on time. Please understand that emergencies sometimes delay scheduled procedures.  2. Diet: Do not eat solid foods after midnight.  The patient may have clear liquids until 5am upon the day of the procedure.  3. Labs: You will need to have blood drawn on TODAY  4. Medication instructions in preparation for your procedure:   Contrast Allergy: No  DO NOT TAKE FUROSEMIDE  THE MORNING OF PROCEDURE   Stop taking Xarelto  (Rivaroxaban ) on Saturday, June 28. (THAT EVENING WILL BE LAST DOSE UNTIL AFTER PROCEDURE)  5. Plan to go home the same day, you will only stay overnight if medically necessary. 6. Bring a current list of your medications and current insurance cards. 7. You MUST have a responsible person to drive you home. 8. Someone MUST be  with you the first 24 hours after you arrive home or your discharge will be delayed. 9. Please wear clothes that are easy to get on and off and wear slip-on shoes.  Thank you for allowing us  to care for you!   -- [facility]  Follow-Up in: 1 MONTH AS SCHEDULED   At the [facility], you and your health needs are our priority. We have a designated team specialized in the treatment of Heart Failure. This Care Team includes your primary Heart Failure Specialized Cardiologist (physician), Advanced Practice Providers (APPs- Physician Assistants and Nurse Practitioners), and Pharmacist who all work together to provide you with the care you need, when you need it.   You may see any of the following providers on your designated Care Team at your next follow up:  Dr. Jules Oar Dr. Peder Bourdon Dr. Alwin Baars Dr. Judyth Nunnery Nieves Bars, NP Ruddy Corral, [facility] [place], [place] Dennise Fitz, NP [place] Lee, NP Luster Salters, PharmD   Please be sure to bring in all your medications bottles to every appointment.   Need to Contact Us :  If you have any questions or concerns before your next appointment please send us  a message through [facility] or call our office at [phone].    TO LEAVE A MESSAGE FOR THE NURSE SELECT OPTION 2, PLEASE LEAVE A MESSAGE INCLUDING: YOUR NAME DATE OF BIRTH CALL BACK NUMBER REASON FOR CALL**this is important as we prioritize the  call backs  YOU WILL RECEIVE A CALL BACK THE SAME DAY AS LONG AS YOU CALL BEFORE 4:00 PM

## 2023-08-21 NOTE — H&P (Addendum)
 History and Physical    Brent Ray. MWN:[id number] DOB: [birth year] DOA: 08/21/2023  PCP: Tressie Fryer, MD   Patient coming from: Home  I have personally briefly reviewed patient's old medical records in [facility] Health Link  Chief Complaint: Dizziness, headache, left eye vision change  HPI: Brent Ray. is a 78 y.o. male with medical history significant for systolic CHF, paroxysmal atrial fibrillation on chronic anticoagulation, rheumatoid arthritis, AICD, CKD 3, hypertension, coronary artery disease. Patient presented to the ED with reports of change in vision dizziness and headache. Patient went to see his cardiologist today.  During the drive home, he had onset of severe headache radiating into his left eye, with dizziness, and reduced vision - darkness to his left eye.  No focal weakness of his extremities, no slurred speech or facial asymmetry noted.  He reported several episodes of vomiting subsequently. He reports history of headache-they have never been this severe and never had any vision problems with them. His headaches improve with Tylenol .   He is on Xarelto  and compliant.  His last dose was last night.  On evaluation, patient's symptoms have resolved, and vision is back to baseline.  ED Course: Temperature 98.  Heart rate 60s to 70s.  Respiratory rate 13-19, blood pressure systolic 151-188.  O2 sats 99% on room air. Stroke called. Head CT negative for acute abnormality. 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 This is age-indeterminate.  CT perfusion with contrast negative for acute ischemia. Unfortunately MRI could not be done because of patient's pacemaker. Headache cocktail given in ED. Evaluated by teleneurologist- Dr. Alecia Ames and followed up on by Dr. Gerome Koyanagi head and neck done.patient has ICA occlusion -acute or chronic, would not favor emergent revascularization at this time.  Plan  to admit to [facility]  Review of Systems: As per HPI all other systems reviewed and negative.  Past Medical History:  Diagnosis Date   A-fib [facility])    AICD (automatic cardioverter/defibrillator) present    Barrett's esophagus 2011   In [place] . Records in Epic   CHF (congestive heart failure) (HCC)    Chronic kidney disease    Coronary atherosclerosis of native coronary artery    Nonobstructive   Essential hypertension, benign    GERD (gastroesophageal reflux disease)    Heart block AV first degree    Chronic   History of kidney stones    Hypercholesterolemia    Mixed hyperlipidemia    NSVT (nonsustained ventricular tachycardia) (HCC)    Holter monitor, 2011; recommended RF ablation by Dr. Nunzio Belch - never required   PAF (paroxysmal atrial fibrillation) [facility])    [facility] in [facility] June 2016   Pre-diabetes    Rheumatoid arthritis (HCC)    Sinus bradycardia    Symptomatic, acebutolol discontinued   Skin cancer    Sleep apnea    pt denies    Past Surgical History:  Procedure Laterality Date   CARDIAC CATHETERIZATION N/A 12/13/2015   Procedure: Left Heart Cath and Coronary Angiography;  Surgeon: Avanell Leigh, MD;  Location[facility] INVASIVE CV LAB;  Service: Cardiovascular;  Laterality: N/A;   CARDIOVERSION N/A 04/11/2016   Procedure: CARDIOVERSION;  Surgeon: Maudine Sos, MD;  Location: [facility] ENDOSCOPY;  Service: Cardiovascular;  Laterality: N/A;   COLONOSCOPY  03/13/2012   [place]--Dr. Spainhour   EP IMPLANTABLE DEVICE N/A 02/01/2016   Procedure: Pacemaker Implant;  Surgeon: Jolly Needle, MD;  Location: [facility] INVASIVE CV  LAB;  Service: Cardiovascular;  Laterality: N/A;   ESOPHAGOGASTRODUODENOSCOPY  03/13/2012   [place], [place]--Dr. Myrlene Asper   HERNIA REPAIR     2   KIDNEY STONE SURGERY     LEFT HEART CATHETERIZATION WITH CORONARY ANGIOGRAM N/A 08/20/2012   Procedure: LEFT HEART CATHETERIZATION WITH CORONARY ANGIOGRAM;  Surgeon: Eilleen Grates, MD;  Location:  [facility] CATH LAB;  Service: Cardiovascular;  Laterality: N/A;   Left leg surgery  03/13/1977   SKIN CANCER EXCISION     TOTAL HIP ARTHROPLASTY Left 01/11/2022   Procedure: TOTAL HIP ARTHROPLASTY ANTERIOR APPROACH;  Surgeon: Adonica Hoose, MD;  Location: [facility] ORS;  Service: Orthopedics;  Laterality: Left;  150     reports that he has never smoked. He has never been exposed to tobacco smoke. He has never used smokeless tobacco. He reports current alcohol  use of about 2.0 standard drinks of alcohol  per week. He reports that he does not use drugs.  Allergies  Allergen Reactions   Amiodarone Other (See Comments)    Pt reports vision changes and walking changes    Flecainide  Other (See Comments)    Severe headaches, blurred vision   Carvedilol  Other (See Comments)    Unknown    Dapagliflozin Diarrhea   Eplerenone Other (See Comments)    Unknown    Flomax [Tamsulosin Hcl] Hives, Itching and Rash   Lisinopril  Nausea And Vomiting   Metoprolol  Other (See Comments)    Headaches, dizziness   Sacubitril-Valsartan Nausea And Vomiting   Spironolactone  Swelling and Other (See Comments)    Breast swelling, headaches   Statins Other (See Comments)    Leg cramps   Sulfasalazine Nausea Only    Family History  Problem Relation Age of Onset   Colon cancer Mother    Other Sister         AGE-61-HEALTHY   Other Sister        AGE 14-HEALTHY   Other Sister        AGE 50 HEALTHY   Other Son        AGE 73 HEALTHY   Prior to Admission medications   Medication Sig Start Date End Date Taking? Authorizing Provider  cephALEXin (KEFLEX) 500 MG capsule Take 500 mg by mouth once. Only for dental appointments 10/21/22  Yes [provider]  CRANBERRY PO Take 500 mg by mouth daily.   Yes [provider]  folic acid  (FOLVITE ) 1 MG tablet Take 1 mg by mouth daily.  NOT on Sunday d/t methotrexate  12/13/22  Yes [provider]  furosemide  (LASIX ) 20 MG tablet Take 1 tablet (20 mg total) by  mouth daily. Patient taking differently: Take 20 mg by mouth every other day. 08/21/23  Yes Sabharwal, Aditya, DO  losartan (COZAAR) 25 MG tablet Take 0.5 tablets (12.5 mg total) by mouth daily. 08/21/23  Yes Sabharwal, Aditya, DO  methotrexate  (RHEUMATREX) 2.5 MG tablet Take 2.5 mg by mouth every Friday. 6 pills weekly   Yes [provider]  mexiletine (MEXITIL ) 150 MG capsule Take 150 mg by mouth 2 (two) times daily.   Yes [provider]  NITROSTAT  0.4 MG SL tablet DISSOLVE 1 TABLET UNDER TONGUE EVERY 5 MINUTES AS NEEDED FOR CHEST PAIN Patient taking differently: Place 0.4 mg under the tongue every 5 (five) minutes as needed for chest pain. 05/11/14  Yes Gerard Knight, MD  ondansetron  (ZOFRAN ) 4 MG tablet Take 4 mg by mouth every 8 (eight) hours as needed for nausea. 01/15/23  Yes [provider]  pantoprazole  (PROTONIX ) 40 MG tablet Take 40 mg by mouth every evening.   Yes [provider]  potassium chloride  SA (KLOR-CON  M) 20 MEQ tablet Take 20 mEq by mouth daily.   Yes [provider]  Probiotic Product (PROBIOTIC DAILY PO) Take 110 mg by mouth daily.   Yes [provider]  Rivaroxaban  (XARELTO ) 15 MG TABS tablet Take 15 mg by mouth daily in the afternoon.   Yes [provider]  rosuvastatin  (CRESTOR ) 5 MG tablet Take 5 mg by mouth every Sunday.   Yes [provider]    Physical Exam: Vitals:   08/21/23 1940 08/21/23 2000 08/21/23 2030 08/21/23 2100  BP: (!) 167/89 (!) 168/96 (!) 168/95 (!) 165/90  Pulse: 69 70 69 70  Resp: 13 16 15 15   Temp:   98 F (36.7 C)   TempSrc:      SpO2: 100% 100% 100% 100%  Weight:      Height:        Constitutional: NAD, calm, comfortable Vitals:   08/21/23 1940 08/21/23 2000 08/21/23 2030 08/21/23 2100  BP: (!) 167/89 (!) 168/96 (!) 168/95 (!) 165/90  Pulse: 69 70 69 70  Resp: 13 16 15 15   Temp:   98 F (36.7 C)   TempSrc:      SpO2: 100% 100% 100% 100%  Weight:       Height:       Eyes: PERRL, lids and conjunctivae normal ENMT: Mucous membranes are moist.  Neck: normal, supple, no masses, no thyromegaly Respiratory: clear to auscultation bilaterally, no wheezing, no crackles. Normal respiratory effort. No accessory muscle use.  Cardiovascular: Regular rate and rhythm, no murmurs / rubs / gallops.  1+ pitting left lower extremity edema, patient reports has had surgery to this extremity leg.  Extremities warm. Abdomen: no tenderness, no masses palpated. No hepatosplenomegaly. Bowel sounds positive.  Musculoskeletal: no clubbing / cyanosis. No joint deformity upper and lower extremities.  Skin: no rashes, lesions, ulcers. No induration Neurologic: vision intact to both eyes, able to finger count correctly, speech fluent, 5 of 5 strength to bilateral upper and lower extremities, equal grip strength. Psychiatric: Normal judgment and insight. Alert and oriented x 3. Normal mood.   Labs on Admission: I have personally reviewed following labs and imaging studies  CBC: Recent Labs  Lab 08/21/23 1235 08/21/23 1722  WBC 6.0 7.0  NEUTROABS  --  5.7  HGB 11.7* 12.4*  HCT 34.0* 36.5*  MCV 99.4 101.1*  PLT 237 264   Basic Metabolic Panel: Recent Labs  Lab 08/21/23 1235 08/21/23 1722  NA 140 140  K 3.9 3.4*  CL 114* 114*  CO2 19* 15*  GLUCOSE 87 128*  BUN 28* 32*  CREATININE 1.84* 2.12*  CALCIUM  8.8* 8.8*   GFR: Estimated Creatinine Clearance: 23.8 mL/min (A) (by C-G formula based on SCr of 2.12 mg/dL (H)). Liver Function Tests: Recent Labs  Lab 08/21/23 1722  AST 37  ALT 33  ALKPHOS 57  BILITOT 1.3*  PROT 6.4*  ALBUMIN  3.5    Radiological Exams on Admission: CT CEREBRAL PERFUSION W CONTRAST Result Date: 08/21/2023 CLINICAL DATA:  Initial evaluation for acute visual disturbance. EXAM: CT PERFUSION BRAIN TECHNIQUE: Multiphase CT imaging of the brain was performed following IV bolus contrast injection. Subsequent parametric perfusion maps  were calculated using RAPID software. RADIATION DOSE REDUCTION: This exam was performed according to the departmental dose-optimization program which includes automated exposure control, adjustment of the mA and/or kV according to  patient size and/or use of iterative reconstruction technique. CONTRAST:  32mL OMNIPAQUE IOHEXOL 350 MG/ML SOLN COMPARISON:  Prior CTs from earlier the same day. FINDINGS: CT Brain Perfusion Findings: CBF (<30%) Volume: 0mL Perfusion (Tmax>6.0s) volume: 0mL Mismatch Volume: 0mL ASPECTS on noncontrast CT Head: 10 at 5:43 p.m. today. Infarct Core: 0 mL Infarction Location:Negative CT perfusion for acute ischemia. Mildly delayed perfusion of T-max greater than 4 seconds in the watershed zones of the left cerebral hemispheres compared to the right, presumably related to the occluded left ICA as seen on prior CTA. IMPRESSION: 1. Negative CT perfusion for acute ischemia. 2. Mildly delayed perfusion of T-max greater than 4 seconds in the watershed zones of the left cerebral hemisphere compared to the right, presumably related to the occluded left ICA as seen on prior CTA. Electronically Signed   By: Virgia Griffins M.D.   On: 08/21/2023 21:04   CT ANGIO HEAD NECK W WO CM Addendum Date: 08/21/2023 ADDENDUM REPORT: 08/21/2023 19:32 ADDENDUM: CTA neck impression #1 and CTA head impression #1 called by telephone on 08/21/2023 at 7:14 pm to provider Dr. Alecia Ames, who verbally acknowledged these results. Electronically Signed   By: Bascom Lily D.O.   On: 08/21/2023 19:32   Result Date: 08/21/2023 CLINICAL DATA:  Provided history: Neuro deficit, acute, stroke suspected. Left-sided headache, vision changes, dizziness. EXAM: CT ANGIOGRAPHY HEAD AND NECK WITH AND WITHOUT CONTRAST TECHNIQUE: Multidetector CT imaging of the head and neck was performed using the standard protocol during bolus administration of intravenous contrast. Multiplanar CT image reconstructions and MIPs were obtained to  evaluate the vascular anatomy. Carotid stenosis measurements (when applicable) are obtained utilizing NASCET criteria, using the distal internal carotid diameter as the denominator. RADIATION DOSE REDUCTION: This exam was performed according to the departmental dose-optimization program which includes automated exposure control, adjustment of the mA and/or kV according to patient size and/or use of iterative reconstruction technique. CONTRAST:  60mL OMNIPAQUE IOHEXOL 350 MG/ML SOLN COMPARISON:  Non-contrast head CT performed earlier today 08/21/2023. FINDINGS: CTA NECK FINDINGS Aortic arch: Standard aortic branching. Atherosclerotic plaque within the visualized aortic arch and proximal major branch vessels of the neck. No hemodynamically significant innominate or proximal subclavian artery stenosis. Right carotid system: CCA and ICA patent within the neck. Atherosclerotic plaque about the carotid bifurcation, within the proximal ICA and within the proximal ECA. Estimated 50% stenosis at the ICA origin. Severe stenosis of the proximal ECA. Left carotid system: The common carotid artery is patent within the neck. The internal carotid artery becomes occluded shortly beyond its origin and remains occluded throughout the remainder of the neck. Calcified atherosclerotic plaque at the ECA origin resulting in at least moderate stenosis. Vertebral arteries: Codominant and patent within the neck. Scattered atherosclerotic plaque within the cervical left vertebral artery. Most notably, calcified plaque within the proximal V3 segment results in up to moderate stenosis. Skeleton: Dextrocurvature of the cervical spine. Cervical spondylosis. No acute fracture or aggressive osseous lesion. Other neck: No neck mass or cervical lymphadenopathy. Upper chest: No consolidation within the imaged lung apices. Review of the MIP images confirms the above findings CTA HEAD FINDINGS Anterior circulation: The intracranial left internal carotid  artery remains occluded to the paraclinoid level. Reconstitution of enhancement within the supraclinoid left ICA. The intracranial right internal carotid artery is patent. Calcified atherosclerotic plaque within this vessel with no more than mild stenosis. The M1 middle cerebral arteries are patent. Atherosclerotic irregularity of the M2 and more distal MCA branches. No M2 proximal branch  occlusion or high-grade proximal stenosis. The anterior cerebral arteries are patent. Azygous configuration of the anterior cerebral arteries. Mildly hypoplastic left A1 segment. No intracranial aneurysm is identified. Posterior circulation: The intracranial vertebral arteries are patent. The basilar artery is patent. The posterior cerebral arteries are patent. Atherosclerotic irregularity of both vessels. Most notably, there is a moderate/severe stenosis within the left PCA P2 segment. A left posterior communicating artery is present. The right posterior communicating artery is diminutive or absent. Venous sinuses: Within the limitations of contrast timing, no convincing thrombus. Anatomic variants: As described grade Review of the MIP images confirms the above findings Attempts are being made to reach the ordering provider at this time. IMPRESSION: CTA neck: 1. 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 This is age-indeterminate. 2. The right common and internal carotid arteries are patent within the neck. Estimated 50% atherosclerotic narrowing at the right ICA origin. 3. Vertebral arteries patent within the neck. Up to moderate atherosclerotic narrowing of the left vertebral artery V3 segment. 4. Aortic Atherosclerosis (ICD10-I70.0). CTA head: 1. 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 This is age-indeterminate. Reconstitution of enhancement within the supraclinoid left ICA. 2. No proximal  intracranial large vessel occlusion identified elsewhere. 3. Background intracranial atherosclerotic disease as described. Most notably, there is a moderate/severe stenosis within the left PCA P2 segment. Electronically Signed: By: Bascom Lily D.O. On: 08/21/2023 19:04   CT HEAD CODE STROKE WO CONTRAST Result Date: 08/21/2023 CLINICAL DATA:  Code stroke. Neuro deficit, acute, stroke suspected. Dizziness. Eye pain on the left, headache, nausea. EXAM: CT HEAD WITHOUT CONTRAST TECHNIQUE: Contiguous axial images were obtained from the base of the skull through the vertex without intravenous contrast. RADIATION DOSE REDUCTION: This exam was performed according to the departmental dose-optimization program which includes automated exposure control, adjustment of the mA and/or kV according to patient size and/or use of iterative reconstruction technique. COMPARISON:  Head CT 08/08/2023. FINDINGS: Brain: Generalized cerebral atrophy. Prominent perivascular spaces within the right basal ganglia inferiorly. Small chronic infarct again demonstrated within the inferior right cerebellar hemisphere. There is no acute intracranial hemorrhage. No demarcated cortical infarct. No extra-axial fluid collection. No evidence of an intracranial mass. No midline shift. Vascular: No hyperdense vessel. Atherosclerotic calcifications. Skull: No calvarial fracture or aggressive osseous lesion. Sinuses/Orbits: No mass or acute finding within the imaged orbits. No significant paranasal sinus disease at the imaged levels. ASPECTS [facility] Stroke Program Early CT Score) - Ganglionic level infarction (caudate, lentiform nuclei, internal capsule, insula, M1-M3 cortex): 7 - Supraganglionic infarction (M4-M6 cortex): 3 Total score (0-10 with 10 being normal): 10 No evidence of an acute intracranial abnormality. These results were communicated to Dr. Alecia Ames at 5:55 pmon 6/10/2025by text page via the [facility] messaging system. IMPRESSION: 1.  No  evidence of an acute intracranial abnormality. 2. 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 3. Cerebral atrophy. Electronically Signed   By: Bascom Lily D.O.   On: 08/21/2023 17:56   EKG: Independently reviewed.  Ventricular paced rhythm, rate 70, QTc 508.  No significant change from prior  Assessment/Plan Principal Problem:   Dizziness Active Problems:   Essential hypertension, benign   Rheumatoid arthritis (HCC)   Chronic systolic CHF (congestive heart failure) (HCC)   Paroxysmal atrial flutter (HCC)   CKD (chronic kidney disease) stage 3, GFR 30-59 ml/min (HCC)  Assessment and Plan: * Dizziness Presenting with sudden  onset of severe headache, left eye vision change, dizziness, vomiting.  History of headaches resolved with Tylenol , and have never been this severe.  Patient on Xarelto  and compliant last dose was last night. Code stroke called. - Evaluated by teleneurologist- Dr. Alecia Ames and followed up on by Dr. Gerome Koyanagi head and neck done.patient has ICA occlusion -acute or chronic, would not favor emergent revascularization at this time.  Plan to [facility] ([facility]  Hold off on antiplatelet as he is on anticoagulation.   -Need MRI compatible with pacemaker- to be done at [facility] - HgbA1c, Lipid panel -Echocardiogram - PT  evaluation - Resume Xarelto , Crestor  - Allow for permissive hypertension -As needed hydralazine  10 mg for systolic greater than 200  Essential hypertension, benign Elevated blood pressure up to 180s. -Hold losartan 12.5 mg for now in the setting of possible stroke.,  Also hold for contrast exposure  CKD (chronic kidney disease) stage 3, GFR 30-59 ml/min (HCC) CKD stage IIIb.  Creatinine 2.12.  Stable  Paroxysmal atrial flutter (HCC) Rate controlled and on anticoagulation. -Resume mexiletine, Xarelto .  Chronic systolic CHF (congestive heart failure) (HCC) Unilateral left lower extremity swelling-patient has  had surgery on this leg.  Been compliant with Xarelto .   Last echo 01/2023 EF of 30 to 35%. - resume Lasix  20 mg Daily  Rheumatoid arthritis (HCC) On methotrexate .   Prolonged QT-508. - Resume mexiletine   DVT prophylaxis:  Xarelto  Code Status: FULL code Family Communication: None at bedside Disposition Plan: ~ 2 days Consults called: Neurology Admission status:  Obs Tele   Author: Pati Bonine, MD 08/21/2023 11:35 PM  For on call review [email or web address].

## 2023-08-21 NOTE — ED Notes (Signed)
 IV access attempted with 18 g- unsuccessful by this nurse. Dr Adan Holms aware. Maci, RN attempting US  IV at this time.

## 2023-08-21 NOTE — Assessment & Plan Note (Signed)
 Rate controlled and on anticoagulation. -Resume mexiletine, Xarelto .

## 2023-08-21 NOTE — ED Provider Notes (Signed)
 [place] EMERGENCY DEPARTMENT AT [facility] Provider Note   CSN: [id number] Arrival date & time: 08/21/23  1646     History {Add pertinent medical, surgical, social history, OB history to HPI:1} Chief Complaint  Patient presents with   Dizziness    Syre Knerr. is a 78 y.o. male.  78 year old male with history of nonischemic cardiomyopathy, symptomatic bradycardia status post pacemaker/defibrillator, atrial fibrillation on Xarelto , tricuspid regurgitation, and left subclavian vein thrombosis who presents emergency department with headache, vision changes, and dizziness.  At approximately 4:10 PM today started experiencing the above symptoms after going home from a cardiology appointment.  Also is reporting decreased visual acuity out of his left eye.  Did take his Xarelto  last night for his A-fib.  Has had multiple rounds of nausea and vomiting.       Home Medications Prior to Admission medications   Medication Sig Start Date End Date Taking? Authorizing Provider  acetaminophen  (TYLENOL ) 325 MG tablet Take 650 mg by mouth daily as needed for headache.    [provider]  ALPRAZolam  (XANAX ) 0.5 MG tablet Take 0.5 mg by mouth 2 (two) times daily as needed for anxiety or sleep. 08/17/23   [provider]  cephALEXin (KEFLEX) 500 MG capsule Take 500 mg by mouth once. Only for dental appointments 10/21/22   [provider]  CRANBERRY PO Take 500 mg by mouth daily.    [provider]  folic acid  (FOLVITE ) 1 MG tablet Take 1 mg by mouth daily.  NOT on Sunday d/t methotrexate  12/13/22   [provider]  furosemide  (LASIX ) 20 MG tablet Take 1 tablet (20 mg total) by mouth daily. 08/21/23   Sabharwal, Aditya, DO  ipratropium (ATROVENT) 0.06 % nasal spray Place 1 spray into both nostrils daily as needed for rhinitis. 01/07/23   [provider]  losartan (COZAAR) 25 MG tablet Take 0.5 tablets (12.5 mg total) by mouth daily.  08/21/23   Sabharwal, Aditya, DO  methotrexate  (RHEUMATREX) 2.5 MG tablet Take 2.5 mg by mouth once a week. 6 pills weekly    [provider]  mexiletine (MEXITIL ) 150 MG capsule Take 150 mg by mouth 2 (two) times daily.    [provider]  NITROSTAT  0.4 MG SL tablet DISSOLVE 1 TABLET UNDER TONGUE EVERY 5 MINUTES AS NEEDED FOR CHEST PAIN Patient taking differently: Place 0.4 mg under the tongue every 5 (five) minutes as needed for chest pain. 05/11/14   Gerard Knight, MD  ondansetron  (ZOFRAN ) 4 MG tablet Take 4 mg by mouth every 8 (eight) hours as needed for nausea. 01/15/23   [provider]  pantoprazole  (PROTONIX ) 40 MG tablet Take 40 mg by mouth every evening.    [provider]  potassium chloride  SA (KLOR-CON  M) 20 MEQ tablet Take 20 mEq by mouth daily.    [provider]  Probiotic Product (PROBIOTIC DAILY PO) Take 110 mg by mouth daily.    [provider]  Rivaroxaban  (XARELTO ) 15 MG TABS tablet Take 15 mg by mouth daily in the afternoon.    [provider]  rosuvastatin  (CRESTOR ) 5 MG tablet Take 5 mg by mouth every Sunday.    [provider]  traMADol (ULTRAM) 50 MG tablet Take 50 mg by mouth daily as needed for moderate pain.    [provider]      Allergies    Amiodarone, Flecainide , Carvedilol , Dapagliflozin, Eplerenone, Flomax [tamsulosin hcl], Lisinopril , Metoprolol , Sacubitril-valsartan, Spironolactone , Statins, and Sulfasalazine  Review of Systems   Review of Systems  Physical Exam Updated Vital Signs BP (!) 188/102 (BP Location: Right Arm)   Pulse 71   Temp 98 F (36.7 C) (Oral)   Resp 15   Ht 5\' 8"  (1.727 m)   Wt 56 kg   SpO2 100%   BMI 18.76 kg/m  Physical Exam Vitals and nursing note reviewed.  Constitutional:      General: He is not in acute distress.    Appearance: He is well-developed.  HENT:     Head: Normocephalic and atraumatic.     Right Ear: External ear normal.      Left Ear: External ear normal.     Nose: Nose normal.  Eyes:     Extraocular Movements: Extraocular movements intact.     Conjunctiva/sclera: Conjunctivae normal.     Pupils: Pupils are equal, round, and reactive to light.     Comments: Pupils 3 mm bilaterally.  Left eye: 20/100 visual acuity Right eye: 20/30 visual acuity  Cardiovascular:     Rate and Rhythm: Normal rate and regular rhythm.     Heart sounds: Normal heart sounds.  Pulmonary:     Effort: Pulmonary effort is normal. No respiratory distress.     Breath sounds: Normal breath sounds.  Musculoskeletal:     Cervical back: Normal range of motion and neck supple.  Skin:    General: Skin is warm and dry.  Neurological:     Mental Status: He is alert.     Comments: NIHSS Exam  Level of Consciousness: Alert  LOC Questions: Answers Month and Age Correctly  LOC Commands: Opens and Closes Eyes and Hands on command  Best Gaze: Horizontal ocular movements intact  Visual Fields: No visual field loss  Facial Palsy: None  L Upper Extremity Motor: No drift after 10 seconds  R Upper Extremity Motor: No drift after 10 seconds  L Lower extremity Motor: drift R Lower extremity Motor: drift Ataxia: Absent  Sensory: Intact sensation to light touch on face, arms, trunk, and legs bilaterally  Best Language: No aphasia  Dysarthria: No dysarthria  Neglect: No visual or sensory neglect    Psychiatric:        Mood and Affect: Mood normal.        Behavior: Behavior normal.     ED Results / Procedures / Treatments   Labs (all labs ordered are listed, but only abnormal results are displayed) Labs Reviewed  CBC  DIFFERENTIAL  COMPREHENSIVE METABOLIC PANEL WITH GFR  ETHANOL  RAPID URINE DRUG SCREEN, HOSP PERFORMED  I-STAT CHEM 8, ED    EKG None  Radiology No results found.  Procedures .Ultrasound ED Peripheral IV (Provider)  Date/Time: 08/21/2023 8:30 PM  Performed by: Ninetta Basket, MD Authorized by: Ninetta Basket, MD   Procedure details:    Indications: multiple failed IV attempts and poor IV access     Skin Prep: chlorhexidine  gluconate     Location:  Right AC   Angiocath:  18 G   Bedside Ultrasound Guided: Yes     Images: not archived     Patient tolerated procedure without complications: Yes     Dressing applied: Yes     {Document cardiac monitor, telemetry assessment procedure when appropriate:1}  Medications Ordered in ED Medications  tetracaine (PONTOCAINE) 0.5 % ophthalmic solution 1 drop (has no administration in time range)  fluorescein ophthalmic strip 1 strip (has no administration in time range)    ED Course/ Medical  Decision Making/ A&P Clinical Course as of 08/21/23 2030  Tue Aug 21, 2023  1803 Dr Alecia Ames feels that the pt likely has a migraine. Does request that he have an MRI if his symptoms do not improve with his headache treatment.  [RP]    Clinical Course User Index [RP] Ninetta Basket, MD   {   Click here for ABCD2, HEART and other calculatorsREFRESH Note before signing :1}                              Medical Decision Making Amount and/or Complexity of Data Reviewed Labs: ordered. Radiology: ordered.  Risk Prescription drug management.   ***  {Document critical care time when appropriate:1} {Document review of labs and clinical decision tools ie heart score, Chads2Vasc2 etc:1}  {Document your independent review of radiology images, and any outside records:1} {Document your discussion with family members, caretakers, and with consultants:1} {Document social determinants of health affecting pt's care:1} {Document your decision making why or why not admission, treatments were needed:1} Final Clinical Impression(s) / ED Diagnoses Final diagnoses:  None    Rx / DC Orders ED Discharge Orders     None

## 2023-08-21 NOTE — Progress Notes (Signed)
 Telestroke cart was activated at [employer]. Per treatment team, pt's LKW was at 1615 with c/o HA, dizziness, L eye visual disturbances. Dr. Efraim Grange, EDP at bedside assessing patient at time of cart activation. Pt transported to CT at 1737 and returned to room at 1750. [facility] was paged for code stroke at 1715. TSRN called [facility] at 1720 regarding cart's battery status. Dr. Alecia Ames, [facility] appeared on telestroke cart at 1725 after cart was plugged in and rebooted to assess the patient. Based on [facility]'s assessment, pt does not meet criteria for emergent interventions at this time 2/2 DOACs within 48 hours. [facility] to f/u with EDP regarding recommendations. No further needs from Telestroke RN at this time. Telestroke cart disconnected at [employer].

## 2023-08-21 NOTE — Assessment & Plan Note (Signed)
 Elevated blood pressure up to 180s. -Hold losartan 12.5 mg for now in the setting of possible stroke.,  Also hold for contrast exposure

## 2023-08-21 NOTE — ED Notes (Signed)
 ED Provider at bedside.

## 2023-08-21 NOTE — Assessment & Plan Note (Signed)
 On methotrexate

## 2023-08-21 NOTE — ED Notes (Addendum)
 A/w AC to bring long 18 g IV cath for US  guided IV in order to do CT perfusion scan per Maci, RN

## 2023-08-21 NOTE — Plan of Care (Signed)
 Signed out by the daytime neurologist to follow-up CT perfusion.  No perfusion deficit.  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  Plan as recommended by Dr. Alecia Ames.  Discussed with Dr. Adan Holms in the ER.  Tona Francis, MD Neurology On-call neurologist

## 2023-08-21 NOTE — ED Notes (Signed)
 CODE STROKE paged out at this time. Charge nurse and primary nurse made aware.

## 2023-08-21 NOTE — ED Triage Notes (Signed)
 Pt arrived via POV following an appointment today at [facility]. Pt reports while driving home with his wife, he began having severe pain in the left side of his face radiating to his head and began feeling dizzy. Pt denies LOC.

## 2023-08-21 NOTE — Assessment & Plan Note (Addendum)
 Presenting with sudden onset of severe headache, left eye vision change, dizziness, vomiting.  History of headaches resolved with Tylenol , and have never been this severe.  Patient on Xarelto  and compliant last dose was last night. Code stroke called. - Evaluated by teleneurologist- Dr. Alecia Ames and followed up on by Dr. Gerome Koyanagi head and neck done.patient has ICA occlusion -acute or chronic, would not favor emergent revascularization at this time.  Plan to admit to [facility]  Hold off on antiplatelet as he is on anticoagulation.   -Need MRI compatible with pacemaker- to be done at [facility] - HgbA1c, Lipid panel -Echocardiogram - PT  evaluation - Resume Xarelto , Crestor  - Allow for permissive hypertension -As needed hydralazine  10 mg for systolic greater than 200

## 2023-08-21 NOTE — ED Notes (Signed)
 AC notified of need for Mexitil  PO from upstairs pharmacy

## 2023-08-21 NOTE — Assessment & Plan Note (Signed)
 CKD stage IIIb.  Creatinine 2.12.  Stable

## 2023-08-21 NOTE — ED Notes (Signed)
 Patient transported to CT

## 2023-08-21 NOTE — Progress Notes (Signed)
 Orders placed for [facility] on 7/1 with Dr. Bruce Caper

## 2023-08-21 NOTE — ED Notes (Signed)
 During Triage, Pt reports he "feels like I'm gonna throw up." Pt provided emesis bag.

## 2023-08-21 NOTE — Progress Notes (Signed)
 ReDS Vest / Clip - 08/21/23 1135       ReDS Vest / Clip   Station Marker C    Ruler Value 28    ReDS Value Range Low volume    ReDS Actual Value 31

## 2023-08-22 ENCOUNTER — Observation Stay (HOSPITAL_BASED_OUTPATIENT_CLINIC_OR_DEPARTMENT_OTHER)

## 2023-08-22 ENCOUNTER — Observation Stay (HOSPITAL_COMMUNITY)

## 2023-08-22 ENCOUNTER — Ambulatory Visit (INDEPENDENT_AMBULATORY_CARE_PROVIDER_SITE_OTHER): Payer: Self-pay

## 2023-08-22 DIAGNOSIS — I440 Atrioventricular block, first degree: Secondary | ICD-10-CM

## 2023-08-22 DIAGNOSIS — R42 Dizziness and giddiness: Secondary | ICD-10-CM | POA: Diagnosis not present

## 2023-08-22 DIAGNOSIS — R0902 Hypoxemia: Secondary | ICD-10-CM | POA: Diagnosis not present

## 2023-08-22 DIAGNOSIS — I6522 Occlusion and stenosis of left carotid artery: Secondary | ICD-10-CM | POA: Diagnosis present

## 2023-08-22 DIAGNOSIS — I5022 Chronic systolic (congestive) heart failure: Secondary | ICD-10-CM | POA: Diagnosis not present

## 2023-08-22 DIAGNOSIS — N1832 Chronic kidney disease, stage 3b: Secondary | ICD-10-CM | POA: Diagnosis not present

## 2023-08-22 DIAGNOSIS — Z743 Need for continuous supervision: Secondary | ICD-10-CM | POA: Diagnosis not present

## 2023-08-22 DIAGNOSIS — I10 Essential (primary) hypertension: Secondary | ICD-10-CM | POA: Diagnosis not present

## 2023-08-22 LAB — CUP PACEART REMOTE DEVICE CHECK
Battery Remaining Longevity: 79 mo
Battery Voltage: 2.98 V
Brady Statistic AP VP Percent: 0 %
Brady Statistic AP VS Percent: 0 %
Brady Statistic AS VP Percent: 91.21 %
Brady Statistic AS VS Percent: 8.79 %
Brady Statistic RA Percent Paced: INVALID
Brady Statistic RV Percent Paced: 91.21 %
Date Time Interrogation Session: 20250610113158
HighPow Impedance: 61 Ohm
Implantable Lead Connection Status: 753985
Implantable Lead Connection Status: 753985
Implantable Lead Connection Status: 753985
Implantable Lead Implant Date: 20171121
Implantable Lead Implant Date: 20230414
Implantable Lead Implant Date: 20230414
Implantable Lead Location: 753858
Implantable Lead Location: 753859
Implantable Lead Location: 753860
Implantable Lead Model: 4798
Implantable Lead Model: 5076
Implantable Pulse Generator Implant Date: 20230414
Lead Channel Impedance Value: 266 Ohm
Lead Channel Impedance Value: 304 Ohm
Lead Channel Impedance Value: 304 Ohm
Lead Channel Impedance Value: 342 Ohm
Lead Channel Impedance Value: 380 Ohm
Lead Channel Impedance Value: 380 Ohm
Lead Channel Impedance Value: 399 Ohm
Lead Channel Impedance Value: 456 Ohm
Lead Channel Impedance Value: 532 Ohm
Lead Channel Impedance Value: 627 Ohm
Lead Channel Impedance Value: 646 Ohm
Lead Channel Impedance Value: 646 Ohm
Lead Channel Impedance Value: 722 Ohm
Lead Channel Pacing Threshold Amplitude: 0.75 V
Lead Channel Pacing Threshold Amplitude: 1.5 V
Lead Channel Pacing Threshold Pulse Width: 0.4 ms
Lead Channel Pacing Threshold Pulse Width: 0.4 ms
Lead Channel Sensing Intrinsic Amplitude: 1.3 mV
Lead Channel Sensing Intrinsic Amplitude: 3.5 mV
Lead Channel Setting Pacing Amplitude: 2 V
Lead Channel Setting Pacing Amplitude: 2 V
Lead Channel Setting Pacing Pulse Width: 0.4 ms
Lead Channel Setting Pacing Pulse Width: 0.4 ms
Lead Channel Setting Sensing Sensitivity: 0.3 mV
Zone Setting Status: 755011

## 2023-08-22 LAB — ECHOCARDIOGRAM COMPLETE
Area-P 1/2: 3.12 cm2
Height: 68 in
MV M vel: 4.66 m/s
MV Peak grad: 87 mmHg
S' Lateral: 4.4 cm
Weight: 1974.4 [oz_av]

## 2023-08-22 LAB — LIPID PANEL
Cholesterol: 125 mg/dL (ref 0–200)
HDL: 62 mg/dL (ref >40)
LDL Cholesterol: 54 mg/dL (ref 0–99)
Total CHOL/HDL Ratio: 2 ratio
Triglycerides: 43 mg/dL (ref <150)
VLDL: 9 mg/dL (ref 0–40)

## 2023-08-22 MED ORDER — FUROSEMIDE 40 MG PO TABS
40.0000 mg | ORAL_TABLET | Freq: Every day | ORAL | Status: DC
  Administered 2023-08-22 – 2023-08-24 (×3): 40 mg via ORAL
  Filled 2023-08-22 (×4): qty 1

## 2023-08-22 MED ORDER — BACITRACIN ZINC 500 UNIT/GM EX OINT
TOPICAL_OINTMENT | CUTANEOUS | Status: AC
  Filled 2023-08-22: qty 0.9

## 2023-08-22 MED ORDER — FLUORESCEIN SODIUM 1 MG OP STRP
1.0000 | ORAL_STRIP | Freq: Once | OPHTHALMIC | Status: DC
  Filled 2023-08-22 (×2): qty 1

## 2023-08-22 NOTE — Hospital Course (Signed)
 78 year old male with a history of HFrEF, permanent atrial fibrillation, rheumatoid arthritis, CKD stage III presenting with acute onset left-sided headache scotoma in the left.  The patient was driving home from his cardiology appointment when this occurred.  He also had some associated dizziness but denied any focal extremity weakness, dysarthria, or dysesthesias. He subsequently had a few episodes of nausea and vomiting which has since resolved. Prior to this episode, the patient had been in his usual state of health without any fevers, chills, headache, neck pain, chest pain, shortness breath, diarrhea, abdominal pain.  ED Course: Temperature 98.  Heart rate 60s to 70s.  Respiratory rate 13-19, blood pressure systolic 151-188.  O2 sats 99% on room air. Stroke called. Head CT negative for acute abnormality. 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 This is age-indeterminate.  CT perfusion with contrast negative for acute ischemia. Unfortunately MRI could not be done because of patient's pacemaker. Headache cocktail given in ED. Evaluated by teleneurologist- Dr. Alecia Ames and followed up on by Dr. Gerome Koyanagi head and neck done.patient has ICA occlusion -acute or chronic, would not favor emergent revascularization at this time.  Plan to admit to [facility]

## 2023-08-22 NOTE — Progress Notes (Signed)
 PROGRESS NOTE  Brent Ray. WUJ:[id number] DOB: 08/13/1945 DOA: 08/21/2023 PCP: Tressie Fryer, MD  Brief History:  78 year old male with a history of HFrEF, permanent atrial fibrillation, rheumatoid arthritis, CKD stage III presenting with acute onset left-sided headache scotoma in the left.  The patient was driving home from his cardiology appointment when this occurred.  He also had some associated dizziness but denied any focal extremity weakness, dysarthria, or dysesthesias. He subsequently had a few episodes of nausea and vomiting which has since resolved. Prior to this episode, the patient had been in his usual state of health without any fevers, chills, headache, neck pain, chest pain, shortness breath, diarrhea, abdominal pain.  ED Course: Temperature 98.  Heart rate 60s to 70s.  Respiratory rate 13-19, blood pressure systolic 151-188.  O2 sats 99% on room air. Stroke called. Head CT negative for acute abnormality. 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 This is age-indeterminate.  CT perfusion with contrast negative for acute ischemia. Unfortunately MRI could not be done because of patient's pacemaker. Headache cocktail given in ED. Evaluated by teleneurologist- Dr. Alecia Ames and followed up on by Dr. Gerome Koyanagi head and neck done.patient has ICA occlusion -acute or chronic, would not favor emergent revascularization at this time.  Plan to [facility] to [facility]   Assessment/Plan: Vision loss/dizziness - Concerned about acute stroke - Evaluated by teleneurologist- Dr. Alecia Ames and followed up on by Dr. Gerome Koyanagi head and neck done.patient has LEFT ICA occlusion -acute or chronic, would not favor emergent revascularization at this time.  Plan to admit to [facility] Neurology Consult -PT/OT evaluation -Speech therapy eval -CT brain--small chronic infarct inferior right cerebellum  hemisphere -MRI brain-- -CTA Neck as above -CTA head--no acute ischemia, no LVO -Echo-- -LDL--54 -HbA1C--pending -Antiplatelet--deferred and lieu of Xarelto   Chronic HFrEF -pt saw Dr. 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 On chronic Xarelto .   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   Objective: Vitals:   08/22/23 0400 08/22/23 0430 08/22/23 0600 08/22/23 0630  BP: (!) 149/86 (!) 165/88 (!) 159/99 (!) 157/94  Pulse: 70 69 70 70  Resp: 14 15 17 11   Temp:      TempSrc:      SpO2: 98% 96% 100% 100%  Weight:      Height:       No intake or output data in the 24 hours ending 08/22/23 0711 Weight change:  Exam:  General:  Pt is alert, follows commands appropriately, not in acute distress HEENT: No icterus, No thrush, No neck mass, [place]/AT Cardiovascular: IRRR, S1/S2, no rubs, no gallops Respiratory: CTA bilaterally, no wheezing, no crackles,  no rhonchi Abdomen: Soft/+BS, non tender, non distended, no guarding Extremities: 1 + LE edema, No lymphangitis, No petechiae, No rashes, no synovitis Neuro:  CN II-XII intact, strength 4/5 in RUE, RLE, strength 4/5 LUE, LLE; sensation intact bilateral; no dysmetria; babinski equivocal    Data Reviewed: I have personally reviewed following labs and imaging studies Basic Metabolic Panel: Recent Labs  Lab 08/21/23 1235 08/21/23 1722  NA 140 140  K 3.9 3.4*  CL 114* 114*  CO2 19* 15*  GLUCOSE 87 128*  BUN 28* 32*  CREATININE 1.84* 2.12*  CALCIUM  8.8* 8.8*   Liver Function Tests: Recent Labs  Lab 08/21/23 1722  AST 37  ALT 33  ALKPHOS 57  BILITOT 1.3*  PROT 6.4*  ALBUMIN  3.5   No results for input(s): LIPASE, AMYLASE in the last 168 hours. No results for input(s): AMMONIA in the last 168 hours. Coagulation Profile: No results for input(s): INR, PROTIME in the last 168 hours. CBC: Recent Labs  Lab 08/21/23 1235 08/21/23 1722  WBC 6.0 7.0  NEUTROABS  --  5.7  HGB 11.7* 12.4*  HCT 34.0* 36.5*  MCV 99.4 101.1*  PLT 237 264   Cardiac Enzymes: No results for input(s): CKTOTAL, CKMB, CKMBINDEX, TROPONINI in the last 168 hours. BNP: Invalid input(s): POCBNP CBG: No results for input(s): GLUCAP in the last 168 hours. HbA1C: No results for input(s): HGBA1C in the last 72 hours. Urine analysis: No results found for: COLORURINE, APPEARANCEUR, LABSPEC, PHURINE, GLUCOSEU, HGBUR, BILIRUBINUR, KETONESUR, PROTEINUR, UROBILINOGEN, NITRITE, LEUKOCYTESUR Sepsis Labs: @LABRCNTIP (procalcitonin:4,lacticidven:4) )No results found for this or any previous visit (from the past 240 hours).   Scheduled Meds:   stroke: early stages of recovery book   Does not apply Once   fluorescein  1 strip Both Eyes Once   mexiletine  150 mg Oral BID   pantoprazole   40 mg Oral QPM   Rivaroxaban   15 mg Oral Q1500   [START ON 08/26/2023]  rosuvastatin   5 mg Oral Q Sun   tetracaine  1 drop Both Eyes Once   Continuous Infusions:  Procedures/Studies: CT CEREBRAL PERFUSION W CONTRAST Result Date: 08/21/2023 CLINICAL DATA:  Initial evaluation for acute visual disturbance. EXAM: CT PERFUSION BRAIN TECHNIQUE: Multiphase CT imaging of the brain was performed following IV bolus contrast injection. Subsequent parametric perfusion maps were calculated using RAPID software. RADIATION DOSE REDUCTION: This exam was performed according to the departmental dose-optimization program which includes automated exposure control, adjustment of the mA and/or kV according to patient size and/or use of iterative reconstruction technique. CONTRAST:  32mL OMNIPAQUE IOHEXOL 350 MG/ML SOLN COMPARISON:  Prior CTs from earlier the same day. FINDINGS: CT Brain Perfusion Findings: CBF (<30%) Volume: 0mL Perfusion (Tmax>6.0s) volume: 0mL Mismatch Volume: 0mL ASPECTS on noncontrast CT Head: 10 at 5:43 p.m. today. Infarct Core: 0 mL Infarction Location:Negative CT perfusion for acute ischemia. Mildly delayed perfusion of T-max greater than 4 seconds in the watershed zones of the left cerebral hemispheres compared to the right, presumably related to the occluded left ICA as seen on prior CTA. IMPRESSION: 1. Negative CT perfusion for acute ischemia. 2. Mildly delayed perfusion of T-max greater than 4 seconds in the watershed zones of the left cerebral hemisphere compared to the right, presumably related to the occluded left ICA  as seen on prior CTA. Electronically Signed   By: Virgia Griffins M.D.   On: 08/21/2023 21:04   CT ANGIO HEAD NECK W WO CM Addendum Date: 08/21/2023 ADDENDUM REPORT: 08/21/2023 19:32 ADDENDUM: CTA neck impression #1 and CTA head impression #1 called by telephone on 08/21/2023 at 7:14 pm to provider Dr. Alecia Ames, who verbally acknowledged these results. Electronically Signed   By: Bascom Lily D.O.   On: 08/21/2023 19:32   Result Date:  08/21/2023 CLINICAL DATA:  Provided history: Neuro deficit, acute, stroke suspected. Left-sided headache, vision changes, dizziness. EXAM: CT ANGIOGRAPHY HEAD AND NECK WITH AND WITHOUT CONTRAST TECHNIQUE: Multidetector CT imaging of the head and neck was performed using the standard protocol during bolus administration of intravenous contrast. Multiplanar CT image reconstructions and MIPs were obtained to evaluate the vascular anatomy. Carotid stenosis measurements (when applicable) are obtained utilizing NASCET criteria, using the distal internal carotid diameter as the denominator. RADIATION DOSE REDUCTION: This exam was performed according to the departmental dose-optimization program which includes automated exposure control, adjustment of the mA and/or kV according to patient size and/or use of iterative reconstruction technique. CONTRAST:  60mL OMNIPAQUE IOHEXOL 350 MG/ML SOLN COMPARISON:  Non-contrast head CT performed earlier today 08/21/2023. FINDINGS: CTA NECK FINDINGS Aortic arch: Standard aortic branching. Atherosclerotic plaque within the visualized aortic arch and proximal major branch vessels of the neck. No hemodynamically significant innominate or proximal subclavian artery stenosis. Right carotid system: CCA and ICA patent within the neck. Atherosclerotic plaque about the carotid bifurcation, within the proximal ICA and within the proximal ECA. Estimated 50% stenosis at the ICA origin. Severe stenosis of the proximal ECA. Left carotid system: The common carotid artery is patent within the neck. The internal carotid artery becomes occluded shortly beyond its origin and remains occluded throughout the remainder of the neck. Calcified atherosclerotic plaque at the ECA origin resulting in at least moderate stenosis. Vertebral arteries: Codominant and patent within the neck. Scattered atherosclerotic plaque within the cervical left vertebral artery. Most notably, calcified plaque within the proximal V3  segment results in up to moderate stenosis. Skeleton: Dextrocurvature of the cervical spine. Cervical spondylosis. No acute fracture or aggressive osseous lesion. Other neck: No neck mass or cervical lymphadenopathy. Upper chest: No consolidation within the imaged lung apices. Review of the MIP images confirms the above findings CTA HEAD FINDINGS Anterior circulation: The intracranial left internal carotid artery remains occluded to the paraclinoid level. Reconstitution of enhancement within the supraclinoid left ICA. The intracranial right internal carotid artery is patent. Calcified atherosclerotic plaque within this vessel with no more than mild stenosis. The M1 middle cerebral arteries are patent. Atherosclerotic irregularity of the M2 and more distal MCA branches. No M2 proximal branch occlusion or high-grade proximal stenosis. The anterior cerebral arteries are patent. Azygous configuration of the anterior cerebral arteries. Mildly hypoplastic left A1 segment. No intracranial aneurysm is identified. Posterior circulation: The intracranial vertebral arteries are patent. The basilar artery is patent. The posterior cerebral arteries are patent. Atherosclerotic irregularity of both vessels. Most notably, there is a moderate/severe stenosis within the left PCA P2 segment. A left posterior communicating artery is present. The right posterior communicating artery is diminutive or absent. Venous sinuses: Within the limitations of contrast timing, no convincing thrombus. Anatomic variants: As described grade Review of the MIP images confirms the above findings Attempts are being made to reach the ordering provider at this time. IMPRESSION: CTA neck: 1. The left internal carotid artery is occluded shortly beyond its origin and  remains occluded throughout the remainder of the neck. This is age-indeterminate. 2. The right common and internal carotid arteries are patent within the neck. Estimated 50% atherosclerotic  narrowing at the right ICA origin. 3. Vertebral arteries patent within the neck. Up to moderate atherosclerotic narrowing of the left vertebral artery V3 segment. 4. Aortic Atherosclerosis (ICD10-I70.0). CTA head: 1. 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 This is age-indeterminate. Reconstitution of enhancement within the supraclinoid left ICA. 2. No proximal intracranial large vessel occlusion identified elsewhere. 3. Background intracranial atherosclerotic disease as described. Most notably, there is a moderate/severe stenosis within the left PCA P2 segment. Electronically Signed: By: Bascom Lily D.O. On: 08/21/2023 19:04   CT HEAD CODE STROKE WO CONTRAST Result Date: 08/21/2023 CLINICAL DATA:  Code stroke. Neuro deficit, acute, stroke suspected. Dizziness. Eye pain on the left, headache, nausea. EXAM: CT HEAD WITHOUT CONTRAST TECHNIQUE: Contiguous axial images were obtained from the base of the skull through the vertex without intravenous contrast. RADIATION DOSE REDUCTION: This exam was performed according to the departmental dose-optimization program which includes automated exposure control, adjustment of the mA and/or kV according to patient size and/or use of iterative reconstruction technique. COMPARISON:  Head CT 08/08/2023. FINDINGS: Brain: Generalized cerebral atrophy. Prominent perivascular spaces within the right basal ganglia inferiorly. Small chronic infarct again demonstrated within the inferior right cerebellar hemisphere. There is no acute intracranial hemorrhage. No demarcated cortical infarct. No extra-axial fluid collection. No evidence of an intracranial mass. No midline shift. Vascular: No hyperdense vessel. Atherosclerotic calcifications. Skull: No calvarial fracture or aggressive osseous lesion. Sinuses/Orbits: No mass or acute finding within the imaged orbits. No significant paranasal sinus disease at the imaged levels. ASPECTS [facility]  Stroke Program Early CT Score) - Ganglionic level infarction (caudate, lentiform nuclei, internal capsule, insula, M1-M3 cortex): 7 - Supraganglionic infarction (M4-M6 cortex): 3 Total score (0-10 with 10 being normal): 10 No evidence of an acute intracranial abnormality. These results were communicated to Dr. Alecia Ames at 5:55 pmon 6/10/2025by text page via the [facility] messaging system. IMPRESSION: 1.  No evidence of an acute intracranial abnormality. 2. 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 3. Cerebral atrophy. Electronically Signed   By: Bascom Lily D.O.   On: 08/21/2023 17:56   CT Cervical Spine Wo Contrast Result Date: 08/08/2023 CLINICAL DATA:  Neck trauma (Age >= 65y) EXAM: CT CERVICAL SPINE WITHOUT CONTRAST TECHNIQUE: Multidetector CT imaging of the cervical spine was performed without intravenous contrast. Multiplanar CT image reconstructions were also generated. RADIATION DOSE REDUCTION: This exam was performed according to the departmental dose-optimization program which includes automated exposure control, adjustment of the mA and/or kV according to patient size and/or use of iterative reconstruction technique. COMPARISON:  None Available. FINDINGS: Alignment: Straightening of normal lordosis. No traumatic subluxation. Skull base and vertebrae: No acute fracture. Vertebral body heights are maintained. Schmorl's node within inferior C7. The dens and skull base are intact. Soft tissues and spinal canal: No prevertebral fluid or swelling. No visible canal hematoma. Disc levels: Disc space narrowing and anterior spurring C4-C5, C5-C6, C6-C7 and C7-T1. Upper chest: No acute findings. Biapical pleuroparenchymal scarring. Other: Carotid calcifications. IMPRESSION: 1. No acute fracture or subluxation of the cervical spine. 2. Multilevel degenerative disc disease. Electronically Signed   By: Chadwick Colonel M.D.   On: 08/08/2023 17:33   DG Elbow 2 Views  Left Result Date: 08/08/2023 CLINICAL DATA:  Pain after fall. EXAM: LEFT ELBOW - 2 VIEW COMPARISON:  None Available. FINDINGS: There is no evidence of fracture or dislocation. Right elevated anterior fat pad but no posterior fat pad elevation. There is no evidence of arthropathy or other focal bone abnormality. Soft tissues are unremarkable. IMPRESSION: 1. Possible small joint effusion. 2. No evidence of fracture or dislocation. If symptoms persist, recommend follow-up radiographs in 7-10 days. Electronically Signed   By: Chadwick Colonel M.D.   On: 08/08/2023 17:26   CT Head Wo Contrast Result Date: 08/08/2023 CLINICAL DATA:  Head trauma, minor (Age >= 65y) EXAM: CT HEAD WITHOUT CONTRAST TECHNIQUE: Contiguous axial images were obtained from the base of the skull through the vertex without intravenous contrast. RADIATION DOSE REDUCTION: This exam was performed according to the departmental dose-optimization program which includes automated exposure control, adjustment of the mA and/or kV according to patient size and/or use of iterative reconstruction technique. COMPARISON:  None Available. FINDINGS: Brain: No intracranial hemorrhage, mass effect, or midline shift. Generalized atrophy. No hydrocephalus. The basilar cisterns are patent. Perivascular space versus remote lacunar infarct in the right basal ganglia. Remote right cerebellar lacunar infarct. No evidence of territorial infarct or acute ischemia. No extra-axial or intracranial fluid collection. Vascular: Atherosclerosis of skullbase vasculature without hyperdense vessel or abnormal calcification. Skull: No fracture or focal lesion. Sinuses/Orbits: Paranasal sinuses and mastoid air cells are clear. The visualized orbits are unremarkable. Other: No confluent scalp hematoma. IMPRESSION: 1. No acute intracranial abnormality. No skull fracture. 2. Generalized atrophy. Remote right cerebellar lacunar infarct. Electronically Signed   By: Chadwick Colonel M.D.    On: 08/08/2023 17:24   DG Chest 2 View Result Date: 07/31/2023 CLINICAL DATA:  Shortness of breath. EXAM: CHEST - 2 VIEW COMPARISON:  02/02/2016. FINDINGS: Bilateral lung fields are clear. Bilateral costophrenic angles are clear. Normal cardio-mediastinal silhouette. There is a left sided 3-lead pacemaker. No acute osseous abnormalities. The soft tissues are within normal limits. IMPRESSION: *No active cardiopulmonary disease. Electronically Signed   By: Brent Ray M.D.   On: 07/31/2023 14:19    Demaris Fillers, DO  [facility]  If 7PM-7AM, please contact night-coverage [email or web address] Password [email or web address] 08/22/2023, 7:11 AM   LOS: 0 days

## 2023-08-22 NOTE — Progress Notes (Signed)
  Echocardiogram 2D Echocardiogram has been performed.  Farley Honer, RDCS 08/22/2023, 9:28 AM

## 2023-08-22 NOTE — ED Notes (Signed)
Pt provided bedside commode at this time.  

## 2023-08-22 NOTE — ED Notes (Signed)
 Echo at bedside at this time

## 2023-08-22 NOTE — ED Notes (Signed)
 Nurse called pt spouse per request and informed her that pt would probably not get bed at [facility] before the morning- recommended she call here to see if pt has been transferred prior to driving to [facility] hospital

## 2023-08-22 NOTE — ED Notes (Signed)
 Cleaned L elbow wound/skin tear that was previously present prior to this ED visit. Normal saline and telfa used on area

## 2023-08-22 NOTE — TOC CM/SW Note (Signed)
 Transition of Care [facility]) - Inpatient Brief Assessment   Patient Details  Name: Brent Ray. MRN: [id number] Date of Birth: November 03, 1945  Transition of [facility]) CM/SW Contact:    Juanda Noon Phone Number: 08/22/2023, 10:07 AM   Clinical Narrative:  Transition of Care Department [facility]) has reviewed patient and no TOC needs have been identified at this time. We will continue to monitor patient advancement through interdisciplinary progression rounds. If new patient transition needs arise, please place a TOC consult.  Patient is transferring to [facility] for MRI. PT eval is pending.   Transition of Care Asessment: Insurance and Status: Insurance coverage has been reviewed Patient has primary care physician: Yes Home environment has been reviewed: Single Family Home with spouse Prior level of function:: Independent Prior/Current Home Services: No current home services Social Drivers of Health Review: SDOH reviewed no interventions necessary Readmission risk has been reviewed: Yes Transition of care needs: transition of care needs identified, TOC will continue to follow

## 2023-08-23 ENCOUNTER — Observation Stay (HOSPITAL_COMMUNITY)

## 2023-08-23 DIAGNOSIS — I7771 Dissection of carotid artery: Secondary | ICD-10-CM | POA: Diagnosis not present

## 2023-08-23 DIAGNOSIS — E785 Hyperlipidemia, unspecified: Secondary | ICD-10-CM

## 2023-08-23 DIAGNOSIS — G453 Amaurosis fugax: Secondary | ICD-10-CM

## 2023-08-23 DIAGNOSIS — N183 Chronic kidney disease, stage 3 unspecified: Secondary | ICD-10-CM

## 2023-08-23 DIAGNOSIS — W19XXXD Unspecified fall, subsequent encounter: Secondary | ICD-10-CM | POA: Diagnosis not present

## 2023-08-23 DIAGNOSIS — I10 Essential (primary) hypertension: Secondary | ICD-10-CM | POA: Diagnosis not present

## 2023-08-23 DIAGNOSIS — I480 Paroxysmal atrial fibrillation: Secondary | ICD-10-CM

## 2023-08-23 DIAGNOSIS — N1832 Chronic kidney disease, stage 3b: Secondary | ICD-10-CM | POA: Diagnosis not present

## 2023-08-23 DIAGNOSIS — I5022 Chronic systolic (congestive) heart failure: Secondary | ICD-10-CM | POA: Diagnosis not present

## 2023-08-23 DIAGNOSIS — R42 Dizziness and giddiness: Secondary | ICD-10-CM | POA: Diagnosis not present

## 2023-08-23 DIAGNOSIS — I130 Hypertensive heart and chronic kidney disease with heart failure and stage 1 through stage 4 chronic kidney disease, or unspecified chronic kidney disease: Secondary | ICD-10-CM

## 2023-08-23 DIAGNOSIS — Z7901 Long term (current) use of anticoagulants: Secondary | ICD-10-CM

## 2023-08-23 DIAGNOSIS — I509 Heart failure, unspecified: Secondary | ICD-10-CM

## 2023-08-23 LAB — HEMOGLOBIN A1C
Hgb A1c MFr Bld: 5.6 % (ref 4.8–5.6)
Mean Plasma Glucose: 114 mg/dL

## 2023-08-23 LAB — BASIC METABOLIC PANEL WITH GFR
Anion gap: 11 (ref 5–15)
BUN: 21 mg/dL (ref 8–23)
CO2: 17 mmol/L — ABNORMAL LOW (ref 22–32)
Calcium: 8.6 mg/dL — ABNORMAL LOW (ref 8.9–10.3)
Chloride: 111 mmol/L (ref 98–111)
Creatinine, Ser: 1.83 mg/dL — ABNORMAL HIGH (ref 0.61–1.24)
GFR, Estimated: 38 mL/min — ABNORMAL LOW (ref >60)
Glucose, Bld: 88 mg/dL (ref 70–99)
Potassium: 3.7 mmol/L (ref 3.5–5.1)
Sodium: 139 mmol/L (ref 135–145)

## 2023-08-23 LAB — MAGNESIUM: Magnesium: 1.9 mg/dL (ref 1.7–2.4)

## 2023-08-23 MED ORDER — HEPARIN (PORCINE) 25000 UT/250ML-% IV SOLN
700.0000 [IU]/h | INTRAVENOUS | Status: DC
  Administered 2023-08-23: 700 [IU]/h via INTRAVENOUS
  Filled 2023-08-23: qty 250

## 2023-08-23 NOTE — Progress Notes (Signed)
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 reflexes.  Sensory - Light touch, temperature/pinprick were assessed and were symmetrical.    Coordination - The patient had normal movements in the hands and feet with no ataxia or dysmetria.  Tremor was absent.  Gait and Station - deferred.   ASSESSMENT/PLAN [name]. is a 78 y.o. male with history of CHF, PAF on Xarelto , RA, AICD, CKD 3, hypertension, CAD, recent fall admitted for headache, dizziness, nausea vomiting, transient left eye vision loss. No TNK given due to symptom resolved.    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  6/10 acute onset pain radiating from lower jaw to the frontal head on the left with left painless vision loss.  The pain gradually eased off in 2 to 3 hours, left vision loss resumed within 3 to 4 hours. CT no acute abnormality, old right cerebellar infarct CT head and neck  left ICA occlusion, reconstituted at siphon.  Right ICA 50% stenosis, left P2 moderate to severe  stenosis CT perfusion negative MRI no acute infarct Pending cerebral angiogram on Monday, discussed with Dr. Ethridge Herder 2D Echo EF 35 to 40% LDL 54 HgbA1c 5.6 UDS negative Xarelto  15 for VTE prophylaxis Xarelto  (rivaroxaban ) daily prior to admission, now on home Xarelto  (rivaroxaban ) daily. Transition to heparin  IV in preparation for cerebral angiogram Ongoing aggressive stroke risk factor management Therapy recommendations: Outpatient PT Disposition: Pending  PAF on [facility] Xarelto  15 With medication Continue Xarelto  on discharge Currently on heparin  IV in preparation for cerebral angiogram  CHF 01/2023 EF 30 to 35% 07/31/2023 admitted for acute on chronic heart failure This admission EF 35 to 40% On Lasix  40 daily  Hypertension Stable Avoid low BP given left ICA occlusion Long term BP goal normotensive  Hyperlipidemia Home meds: Crestor  5 LDL 54, goal < 70 Now on Crestor  5 No high intensity statin given LDL at goal Continue statin at discharge  Other Stroke Risk Factors Advanced age Coronary artery disease  Other Active Problems AICD placement Rheumatoid arthritis CKD 3, creatinine 1.84--2.12 Recent diarrhea  Hospital day # 0  I discussed with Dr. Hilton Lucky and Dr. Ethridge Herder. I spent extensive total face-to-face time with the patient and his wife, recounted HPI and recent events, reviewing test results, images and medication, and discussing the diagnosis, treatment plan and potential prognosis. This patient's care requiresreview of multiple databases, neurological assessment, discussion with family, other specialists and medical decision making of high complexity.   Consuelo Denmark, MD PhD Stroke Neurology 08/23/2023 5:42 PM    To contact Stroke Continuity provider, please refer to [email or web address]. After hours, contact General Neurology

## 2023-08-23 NOTE — Consult Note (Signed)
 Chief Complaint: Patient was seen in consultation today for carotid stenosis   Referring Physician(s): Dr. Consuelo Denmark  Supervising Physician: Luellen Sages  Patient Status: [facility] - In-pt  History of Present Illness: Brent Ray. is a 78 y.o. male who has been admitted with findings of left carotid stenosis. Brief hx, pt was driving and had episode of sharp pain up his left neck/face and subsequent left eye blindness that lasted a couple hours, but has completely resolved. He initially went to [facility] but has been transferred here. His CTA shows (L)intracranial ICA stenosis vs occlusion with reconstitution of the supraclinoid ICA. Neuro team consulted and has reached out to [facility] team for consideration of angiogram and possible intervention.  The pt is seen with his wife at bedside. Reports sxs have fully resolved. Has never had any prior TIA/CVA like sxs to his knowledge.  He is on chronic Xarelto  for A. Fib and has had some bleeding issues from hemorrhoids and epistaxis but has remained on Xarelto .  PMHx, meds, labs, imaging, allergies reviewed. Feels well, no recent fevers, chills, illness. Has been NPO today as directed.   Past Medical History:  Diagnosis Date   A-fib [facility])    AICD (automatic cardioverter/defibrillator) present    Barrett's esophagus 2011   In [place] . Records in Epic   CHF (congestive heart failure) (HCC)    Chronic kidney disease    Coronary atherosclerosis of native coronary artery    Nonobstructive   Essential hypertension, benign    GERD (gastroesophageal reflux disease)    Heart block AV first degree    Chronic   History of kidney stones    Hypercholesterolemia    Mixed hyperlipidemia    NSVT (nonsustained ventricular tachycardia) (HCC)    Holter monitor, 2011; recommended RF ablation by Dr. Nunzio Belch - never required   PAF (paroxysmal atrial fibrillation) [facility])    Recently [facility] 2016   Pre-diabetes     Rheumatoid arthritis (HCC)    Sinus bradycardia    Symptomatic, acebutolol discontinued   Skin cancer    Sleep apnea    pt denies    Past Surgical History:  Procedure Laterality Date   CARDIAC CATHETERIZATION N/A 12/13/2015   Procedure: Left Heart Cath and Coronary Angiography;  Surgeon: Avanell Leigh, MD;  Location: [facility] INVASIVE CV LAB;  Service: Cardiovascular;  Laterality: N/A;   CARDIOVERSION N/A 04/11/2016   Procedure: CARDIOVERSION;  Surgeon: Maudine Sos, MD;  Location: [facility] ENDOSCOPY;  Service: Cardiovascular;  Laterality: N/A;   COLONOSCOPY  03/13/2012   [place], [place]--Dr. Spainhour   EP IMPLANTABLE DEVICE N/A 02/01/2016   Procedure: Pacemaker Implant;  Surgeon: Jolly Needle, MD;  Location: [facility] INVASIVE CV LAB;  Service: Cardiovascular;  Laterality: N/A;   ESOPHAGOGASTRODUODENOSCOPY  03/13/2012   [place], [place]--Dr. Spainhour   HERNIA REPAIR     2   KIDNEY STONE SURGERY     LEFT HEART CATHETERIZATION WITH CORONARY ANGIOGRAM N/A 08/20/2012   Procedure: LEFT HEART CATHETERIZATION WITH CORONARY ANGIOGRAM;  Surgeon: Eilleen Grates, MD;  Location: [facility] CATH LAB;  Service: Cardiovascular;  Laterality: N/A;   Left leg surgery  03/13/1977   SKIN CANCER EXCISION     TOTAL HIP ARTHROPLASTY Left 01/11/2022   Procedure: TOTAL HIP ARTHROPLASTY ANTERIOR APPROACH;  Surgeon: Adonica Hoose, MD;  Location: [facility] ORS;  Service: Orthopedics;  Laterality: Left;  150    Allergies: Amiodarone, Flecainide , Carvedilol , Dapagliflozin, Eplerenone, Flomax [tamsulosin hcl], Lisinopril , Metoprolol , Sacubitril-valsartan, Spironolactone , Statins, and Sulfasalazine  Medications:  Current Facility-Administered Medications:     stroke: early stages of recovery book, , Does not apply, Once, Emokpae, Ejiroghene E, MD   acetaminophen  (TYLENOL ) tablet 650 mg, 650 mg, Oral, Q4H PRN **OR** acetaminophen  (TYLENOL ) 160 MG/5ML solution 650 mg, 650 mg, Per Tube, Q4H PRN **OR** acetaminophen  (TYLENOL ) suppository 650  mg, 650 mg, Rectal, Q4H PRN, Emokpae, Ejiroghene E, MD   fluorescein ophthalmic strip 1 strip, 1 strip, Both Eyes, Once, Paytes, Austin A, RPH   furosemide  (LASIX ) tablet 40 mg, 40 mg, Oral, Daily, Tat, David, MD, 40 mg at 08/23/23 1054   heparin  ADULT infusion 100 units/mL (25000 units/250mL), 700 Units/hr, Intravenous, Continuous, Macdonald Savoy Erin R, RPH   hydrALAZINE  (APRESOLINE ) injection 10 mg, 10 mg, Intravenous, Q4H PRN, Emokpae, Ejiroghene E, MD   mexiletine (MEXITIL ) capsule 150 mg, 150 mg, Oral, BID, Emokpae, Ejiroghene E, MD, 150 mg at 08/23/23 1144   pantoprazole  (PROTONIX ) EC tablet 40 mg, 40 mg, Oral, QPM, Emokpae, Ejiroghene E, MD, 40 mg at 08/22/23 1857   [START ON 08/26/2023] rosuvastatin  (CRESTOR ) tablet 5 mg, 5 mg, Oral, Q Sun, Emokpae, Ejiroghene E, MD   senna-docusate (Senokot-S) tablet 1 tablet, 1 tablet, Oral, QHS PRN, Emokpae, Ejiroghene E, MD   tetracaine (PONTOCAINE) 0.5 % ophthalmic solution 1 drop, 1 drop, Both Eyes, Once, Ninetta Basket, MD    Family History  Problem Relation Age of Onset   Colon cancer Mother    Other Sister         AGE-82-HEALTHY   Other Sister        AGE 52-HEALTHY   Other Sister        AGE 76 HEALTHY   Other Son        AGE 76 HEALTHY    Social History   Socioeconomic History   Marital status: Married    Spouse name: Not on file   Number of children: 1   Years of education: Not on file   Highest education level: Not on file  Occupational History   Occupation: [employer]    Comment: Quarry manager  Tobacco Use   Smoking status: Never    Passive exposure: Never   Smokeless tobacco: Never  Vaping Use   Vaping status: Never Used  Substance and Sexual Activity   Alcohol  use: Yes    Alcohol /week: 2.0 standard drinks of alcohol     Types: 2 Cans of beer per week    Comment: Occasional beer   Drug use: No   Sexual activity: Yes    Partners: Female  Other Topics Concern   Not on file  Social History Narrative    Daily caffeine    Social Drivers of Health   Financial Resource Strain: Low Risk  (07/01/2021)   Received from [facility]   Overall Financial Resource Strain (CARDIA)  Food Insecurity: No Food Insecurity (01/11/2022)   Hunger Vital Sign    Worried About Running Out of Food in the Last Year: Never true    Ran Out of Food in the Last Year: Never true  Transportation Needs: No Transportation Needs (08/22/2023)   PRAPARE - Administrator, Civil Service (Medical): No    Lack of Transportation (Non-Medical): No  Physical Activity: Not on file  Stress: Not on file  Social Connections: Moderately Integrated (08/22/2023)   Social Connection and Isolation Panel    Frequency of Communication with Friends and Family: Twice a week    Frequency of Social Gatherings with Friends and Family: Once  a week    Attends Religious Services: More than 4 times per year    Active Member of Clubs or Organizations: No    Attends Banker Meetings: Never    Marital Status: Married    Review of Systems: A 12 point ROS discussed and pertinent positives are indicated in the HPI above.  All other systems are negative.  Review of Systems  Vital Signs: BP 138/71 (BP Location: Right Leg)   Pulse 71   Temp (!) 97.4 F (36.3 C) (Axillary)   Resp 20   Ht 5' 8 (1.727 m)   Wt 123 lb 6.4 oz (56 kg)   SpO2 100%   BMI 18.76 kg/m   Physical Exam Constitutional:      Appearance: He is not ill-appearing.  HENT:     Mouth/Throat:     Mouth: Mucous membranes are moist.     Pharynx: Oropharynx is clear.   Cardiovascular:     Rate and Rhythm: Normal rate. Rhythm irregular.     Pulses: Normal pulses.     Heart sounds: Normal heart sounds.  Pulmonary:     Effort: Pulmonary effort is normal. No respiratory distress.     Breath sounds: Normal breath sounds.   Skin:    General: Skin is warm and dry.   Neurological:     General: No focal deficit present.     Mental  Status: He is alert and oriented to person, place, and time.     Cranial Nerves: No cranial nerve deficit.     Motor: No weakness.   Psychiatric:        Mood and Affect: Mood normal.        Thought Content: Thought content normal.      Imaging: MR BRAIN WO CONTRAST Result Date: 08/23/2023 CLINICAL DATA:  Provided history: Vision changes. Headache. Dizziness. EXAM: MRI HEAD WITHOUT CONTRAST TECHNIQUE: Multiplanar, multiecho pulse sequences of the brain and surrounding structures were obtained without intravenous contrast. COMPARISON:  Non-contrast head CT, CT angiogram head/neck and CT perfusion head 08/21/2023. FINDINGS: Brain: Mild generalized cerebral atrophy. 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 Multifocal T2 FLAIR hyperintense signal abnormality within the cerebral white matter, nonspecific but compatible with moderate chronic small vessel ischemic disease. Mild chronic small vessel ischemic changes within the pons. Small T2 hyperintense focus within the right caudate head likely reflecting a prominent perivascular space. Prominent perivascular space also present within the right basal ganglia inferiorly. 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 Nonspecific punctate chronic microhemorrhage within medial the right cerebellar hemisphere. No evidence of an intracranial mass. No extra-axial fluid collection. No midline shift. 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 Skull and upper cervical spine: No focal worrisome marrow lesion. Sinuses/Orbits: No mass or acute finding within the imaged orbits. Prior but ocular lens replacement. No significant paranasal sinus disease. IMPRESSION: 1. 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 2. Known occlusion of the left internal  carotid artery. 3. Chronic small vessel ischemic changes which are moderate in the cerebral white matter and mild in the pons. 4. Small chronic infarct within the right cerebellar hemisphere. 5. Mild generalized cerebral atrophy. Electronically Signed   By: Bascom Lily D.O.   On: 08/23/2023 13:22   CUP PACEART REMOTE DEVICE CHECK Result Date: 08/22/2023 ICD Scheduled remote reviewed.  Normal device function.  Presenting rhythm: BIV paced HF diagnostics abnormal this monitoring period. BIV pacing at 91%; stable. No new events since 4/4 programmer session, prior to that 6 NSVT events, longest x 4 sec; V rates >200 bpm.  Likely ventricular driven. Next remote 91 days. [place], CVRS  ECHOCARDIOGRAM COMPLETE Result Date: 08/22/2023    ECHOCARDIOGRAM REPORT   Patient Name:   Jordan Pardini. Date of Exam: 08/22/2023 Medical Rec #:  [id number]            Height:       68.0 in Accession #:    [id number]           Weight:       123.4 lb Date of Birth:  02/24/1946            BSA:          1.665 m Patient Age:    77 years             BP:           142/87 mmHg Patient Gender: M                    HR:           83 bpm. Exam Location:  [facility] Procedure: 2D Echo, Cardiac Doppler, Color Doppler and Strain Analysis (Both            Spectral and Color Flow Doppler were utilized during procedure). Indications:    stroke I63.9  History:        Patient has prior history of Echocardiogram examinations, most                 recent 01/29/2023. CHF, CAD; Risk Factors:Hypertension and                 Dyslipidemia.  Sonographer:    Kip Peon RDCS Referring Phys: [id number] Ar[facility] Lao [facility]  Sonographer Comments: Global longitudinal strain was attempted. IMPRESSIONS  1. Left ventricular ejection fraction, by estimation, is 35 to 40%. The left ventricle has moderately decreased function. The left ventricle demonstrates regional wall motion abnormalities (see scoring diagram/findings for description). There is mild concentric left  ventricular hypertrophy. Left ventricular diastolic parameters are indeterminate.  2. Right ventricular systolic function is normal. The right ventricular size is normal. There is mildly elevated pulmonary artery systolic pressure. The estimated right ventricular systolic pressure is 38.0 mmHg.  3. Left atrial size was moderately dilated.  4. The mitral valve is degenerative with mild posterior leaflet prolapse. Moderate to severe mitral valve regurgitation.  5. Tricuspid valve regurgitation is mild to moderate.  6. The aortic valve is tricuspid. There is mild calcification of the aortic valve. Aortic valve regurgitation is trivial. No aortic stenosis is present.  7. The inferior vena cava is normal in size with greater than 50% respiratory variability, suggesting right atrial pressure of 3 mmHg.  8. Evidence of probable atrial level shunting detected by color flow Doppler, left to right at mid interatrial septum. Suggest agitated saline study. Comparison(s): Prior images reviewed side by side. LVEF 35-40% with stable WMA and septal motion consistent with RV pacing. Mild posterior leaflet mitral prolapse with moderate to severe mtiral regurgitation. 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 FINDINGS  Left Ventricle: Left ventricular ejection fraction, by estimation, is 35 to 40%. The left ventricle has moderately decreased function. The left ventricle demonstrates regional wall motion abnormalities. The left ventricular internal  cavity size was normal in size. There is mild concentric left ventricular hypertrophy. Abnormal (paradoxical) septal motion, consistent with RV pacemaker. Left ventricular diastolic parameters are indeterminate.  LV Wall Scoring: The entire inferior wall, posterior wall, and basal anterolateral segment are hypokinetic. The entire anterior wall, entire septum, apical lateral segment, mid anterolateral segment, and apex are normal. Right Ventricle: The right  ventricular size is normal. No increase in right ventricular wall thickness. Right ventricular systolic function is normal. There is mildly elevated pulmonary artery systolic pressure. The tricuspid regurgitant velocity is 2.96  m/s, and with an assumed right atrial pressure of 3 mmHg, the estimated right ventricular systolic pressure is 38.0 mmHg. Left Atrium: Left atrial size was moderately dilated. Right Atrium: Right atrial size was normal in size. Pericardium: There is no evidence of pericardial effusion. Mitral Valve: The mitral valve is degenerative in appearance. There is mild thickening of the mitral valve leaflet(s). Moderate to severe mitral valve regurgitation, with posteriorly-directed jet. Tricuspid Valve: The tricuspid valve is grossly normal. Tricuspid valve regurgitation is mild to moderate. Aortic Valve: The aortic valve is tricuspid. There is mild calcification of the aortic valve. There is mild aortic valve annular calcification. Aortic valve regurgitation is trivial. No aortic stenosis is present. Pulmonic Valve: The pulmonic valve was grossly normal. Pulmonic valve regurgitation is trivial. Aorta: The aortic root and ascending aorta are structurally normal, with no evidence of dilitation. Venous: The inferior vena cava is normal in size with greater than 50% respiratory variability, suggesting right atrial pressure of 3 mmHg. IAS/Shunts: Evidence of atrial level shunting detected by color flow Doppler. Additional Comments: 3D was performed not requiring image post processing on an independent workstation and was indeterminate. A device lead is visualized.  LEFT VENTRICLE PLAX 2D LVIDd:         5.10 cm LVIDs:         4.40 cm LV PW:         1.40 cm 3D Volume EF LV IVS:        1.00 cm LV 3D EDV:   123.82 ml                        LV 3D ESV:   48.23 ml RIGHT VENTRICLE          IVC RV Basal diam:  3.70 cm  IVC diam: 1.60 cm LEFT ATRIUM             Index        RIGHT ATRIUM           Index LA diam:         4.10 cm 2.46 cm/m   RA Area:     19.90 cm LA Vol (A2C):   58.0 ml 34.84 ml/m  RA Volume:   50.80 ml  30.52 ml/m LA Vol (A4C):   84.5 ml 50.76 ml/m LA Biplane Vol: 73.4 ml 44.10 ml/m  AORTIC VALVE LVOT Vmax:   75.95 cm/s LVOT Vmean:  46.700 cm/s LVOT VTI:    0.125 m  AORTA Ao Asc diam: 3.20 cm MITRAL VALVE               TRICUSPID VALVE MV Area (PHT): 3.12 cm    TR Peak grad:   35.0 mmHg MV Decel Time: 243 msec    TR Vmax:        296.00 cm/s MR Peak grad: 87.0 mmHg MR Mean grad: 62.0 mmHg    SHUNTS MR  Vmax:      466.33 cm/s  Systemic VTI: 0.12 m MR Vmean:     367.0 cm/s MV E velocity: 70.60 cm/s MV A velocity: 30.70 cm/s MV E/A ratio:  2.30 Teddie Favre MD Electronically signed by Teddie Favre MD Signature Date/Time: 08/22/2023/11:28:01 AM    Final    CT CEREBRAL PERFUSION W CONTRAST Result Date: 08/21/2023 CLINICAL DATA:  Initial evaluation for acute visual disturbance. EXAM: CT PERFUSION BRAIN TECHNIQUE: Multiphase CT imaging of the brain was performed following IV bolus contrast injection. Subsequent parametric perfusion maps were calculated using RAPID software. RADIATION DOSE REDUCTION: This exam was performed according to the departmental dose-optimization program which includes automated exposure control, adjustment of the mA and/or kV according to patient size and/or use of iterative reconstruction technique. CONTRAST:  32mL OMNIPAQUE IOHEXOL 350 MG/ML SOLN COMPARISON:  Prior CTs from earlier the same day. FINDINGS: CT Brain Perfusion Findings: CBF (<30%) Volume: 0mL Perfusion (Tmax>6.0s) volume: 0mL Mismatch Volume: 0mL ASPECTS on noncontrast CT Head: 10 at 5:43 p.m. today. Infarct Core: 0 mL Infarction Location:Negative CT perfusion for acute ischemia. Mildly delayed perfusion of T-max greater than 4 seconds in the watershed zones of the left cerebral hemispheres compared to the right, presumably related to the occluded left ICA as seen on prior CTA. IMPRESSION: 1. Negative CT perfusion  for acute ischemia. 2. Mildly delayed perfusion of T-max greater than 4 seconds in the watershed zones of the left cerebral hemisphere compared to the right, presumably related to the occluded left ICA as seen on prior CTA. Electronically Signed   By: Virgia Griffins M.D.   On: 08/21/2023 21:04   CT ANGIO HEAD NECK W WO CM Addendum Date: 08/21/2023 ADDENDUM REPORT: 08/21/2023 19:32 ADDENDUM: CTA neck impression #1 and CTA head impression #1 called by telephone on 08/21/2023 at 7:14 pm to provider Dr. Alecia Ames, who verbally acknowledged these results. Electronically Signed   By: Bascom Lily D.O.   On: 08/21/2023 19:32   Result Date: 08/21/2023 CLINICAL DATA:  Provided history: Neuro deficit, acute, stroke suspected. Left-sided headache, vision changes, dizziness. EXAM: CT ANGIOGRAPHY HEAD AND NECK WITH AND WITHOUT CONTRAST TECHNIQUE: Multidetector CT imaging of the head and neck was performed using the standard protocol during bolus administration of intravenous contrast. Multiplanar CT image reconstructions and MIPs were obtained to evaluate the vascular anatomy. Carotid stenosis measurements (when applicable) are obtained utilizing NASCET criteria, using the distal internal carotid diameter as the denominator. RADIATION DOSE REDUCTION: This exam was performed according to the departmental dose-optimization program which includes automated exposure control, adjustment of the mA and/or kV according to patient size and/or use of iterative reconstruction technique. CONTRAST:  60mL OMNIPAQUE IOHEXOL 350 MG/ML SOLN COMPARISON:  Non-contrast head CT performed earlier today 08/21/2023. FINDINGS: CTA NECK FINDINGS Aortic arch: Standard aortic branching. Atherosclerotic plaque within the visualized aortic arch and proximal major branch vessels of the neck. No hemodynamically significant innominate or proximal subclavian artery stenosis. Right carotid system: CCA and ICA patent within the neck. Atherosclerotic  plaque about the carotid bifurcation, within the proximal ICA and within the proximal ECA. Estimated 50% stenosis at the ICA origin. Severe stenosis of the proximal ECA. Left carotid system: The common carotid artery is patent within the neck. The internal carotid artery becomes occluded shortly beyond its origin and remains occluded throughout the remainder of the neck. Calcified atherosclerotic plaque at the ECA origin resulting in at least moderate stenosis. Vertebral arteries: Codominant and patent within the neck. Scattered atherosclerotic plaque within the  cervical left vertebral artery. Most notably, calcified plaque within the proximal V3 segment results in up to moderate stenosis. Skeleton: Dextrocurvature of the cervical spine. Cervical spondylosis. No acute fracture or aggressive osseous lesion. Other neck: No neck mass or cervical lymphadenopathy. Upper chest: No consolidation within the imaged lung apices. Review of the MIP images confirms the above findings CTA HEAD FINDINGS Anterior circulation: The intracranial left internal carotid artery remains occluded to the paraclinoid level. Reconstitution of enhancement within the supraclinoid left ICA. The intracranial right internal carotid artery is patent. Calcified atherosclerotic plaque within this vessel with no more than mild stenosis. The M1 middle cerebral arteries are patent. Atherosclerotic irregularity of the M2 and more distal MCA branches. No M2 proximal branch occlusion or high-grade proximal stenosis. The anterior cerebral arteries are patent. Azygous configuration of the anterior cerebral arteries. Mildly hypoplastic left A1 segment. No intracranial aneurysm is identified. Posterior circulation: The intracranial vertebral arteries are patent. The basilar artery is patent. The posterior cerebral arteries are patent. Atherosclerotic irregularity of both vessels. Most notably, there is a moderate/severe stenosis within the left PCA P2 segment.  A left posterior communicating artery is present. The right posterior communicating artery is diminutive or absent. Venous sinuses: Within the limitations of contrast timing, no convincing thrombus. Anatomic variants: As described grade Review of the MIP images confirms the above findings Attempts are being made to reach the ordering provider at this time. IMPRESSION: CTA neck: 1. 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 This is age-indeterminate. 2. The right common and internal carotid arteries are patent within the neck. Estimated 50% atherosclerotic narrowing at the right ICA origin. 3. Vertebral arteries patent within the neck. Up to moderate atherosclerotic narrowing of the left vertebral artery V3 segment. 4. Aortic Atherosclerosis (ICD10-I70.0). CTA head: 1. 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 This is age-indeterminate. Reconstitution of enhancement within the supraclinoid left ICA. 2. No proximal intracranial large vessel occlusion identified elsewhere. 3. Background intracranial atherosclerotic disease as described. Most notably, there is a moderate/severe stenosis within the left PCA P2 segment. Electronically Signed: By: Bascom Lily D.O. On: 08/21/2023 19:04   CT HEAD CODE STROKE WO CONTRAST Result Date: 08/21/2023 CLINICAL DATA:  Code stroke. Neuro deficit, acute, stroke suspected. Dizziness. Eye pain on the left, headache, nausea. EXAM: CT HEAD WITHOUT CONTRAST TECHNIQUE: Contiguous axial images were obtained from the base of the skull through the vertex without intravenous contrast. RADIATION DOSE REDUCTION: This exam was performed according to the departmental dose-optimization program which includes automated exposure control, adjustment of the mA and/or kV according to patient size and/or use of iterative reconstruction technique. COMPARISON:  Head CT  08/08/2023. FINDINGS: Brain: Generalized cerebral atrophy. Prominent perivascular spaces within the right basal ganglia inferiorly. Small chronic infarct again demonstrated within the inferior right cerebellar hemisphere. There is no acute intracranial hemorrhage. No demarcated cortical infarct. No extra-axial fluid collection. No evidence of an intracranial mass. No midline shift. Vascular: No hyperdense vessel. Atherosclerotic calcifications. Skull: No calvarial fracture or aggressive osseous lesion. Sinuses/Orbits: No mass or acute finding within the imaged orbits. No significant paranasal sinus disease at the imaged levels. ASPECTS [facility] Stroke Program Early CT Score) - Ganglionic level infarction (caudate, lentiform nuclei, internal capsule, insula, M1-M3 cortex): 7 - Supraganglionic infarction (M4-M6 cortex): 3 Total score (0-10 with 10 being normal): 10 No evidence of an acute intracranial abnormality. These results were communicated  to Dr. Alecia Ames at 5:55 pmon 6/10/2025by text page via the [facility] messaging system. IMPRESSION: 1.  No evidence of an acute intracranial abnormality. 2. 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 3. Cerebral atrophy. Electronically Signed   By: Bascom Lily D.O.   On: 08/21/2023 17:56   CT Cervical Spine Wo Contrast Result Date: 08/08/2023 CLINICAL DATA:  Neck trauma (Age >= 65y) EXAM: CT CERVICAL SPINE WITHOUT CONTRAST TECHNIQUE: Multidetector CT imaging of the cervical spine was performed without intravenous contrast. Multiplanar CT image reconstructions were also generated. RADIATION DOSE REDUCTION: This exam was performed according to the departmental dose-optimization program which includes automated exposure control, adjustment of the mA and/or kV according to patient size and/or use of iterative reconstruction technique. COMPARISON:  None Available. FINDINGS: Alignment: Straightening of normal lordosis. No traumatic  subluxation. Skull base and vertebrae: No acute fracture. Vertebral body heights are maintained. Schmorl's node within inferior C7. The dens and skull base are intact. Soft tissues and spinal canal: No prevertebral fluid or swelling. No visible canal hematoma. Disc levels: Disc space narrowing and anterior spurring C4-C5, C5-C6, C6-C7 and C7-T1. Upper chest: No acute findings. Biapical pleuroparenchymal scarring. Other: Carotid calcifications. IMPRESSION: 1. No acute fracture or subluxation of the cervical spine. 2. Multilevel degenerative disc disease. Electronically Signed   By: Chadwick Colonel M.D.   On: 08/08/2023 17:33   DG Elbow 2 Views Left Result Date: 08/08/2023 CLINICAL DATA:  Pain after fall. EXAM: LEFT ELBOW - 2 VIEW COMPARISON:  None Available. FINDINGS: There is no evidence of fracture or dislocation. Right elevated anterior fat pad but no posterior fat pad elevation. There is no evidence of arthropathy or other focal bone abnormality. Soft tissues are unremarkable. IMPRESSION: 1. Possible small joint effusion. 2. No evidence of fracture or dislocation. If symptoms persist, recommend follow-up radiographs in 7-10 days. Electronically Signed   By: Chadwick Colonel M.D.   On: 08/08/2023 17:26   CT Head Wo Contrast Result Date: 08/08/2023 CLINICAL DATA:  Head trauma, minor (Age >= 65y) EXAM: CT HEAD WITHOUT CONTRAST TECHNIQUE: Contiguous axial images were obtained from the base of the skull through the vertex without intravenous contrast. RADIATION DOSE REDUCTION: This exam was performed according to the departmental dose-optimization program which includes automated exposure control, adjustment of the mA and/or kV according to patient size and/or use of iterative reconstruction technique. COMPARISON:  None Available. FINDINGS: Brain: No intracranial hemorrhage, mass effect, or midline shift. Generalized atrophy. No hydrocephalus. The basilar cisterns are patent. Perivascular space versus remote  lacunar infarct in the right basal ganglia. Remote right cerebellar lacunar infarct. No evidence of territorial infarct or acute ischemia. No extra-axial or intracranial fluid collection. Vascular: Atherosclerosis of skullbase vasculature without hyperdense vessel or abnormal calcification. Skull: No fracture or focal lesion. Sinuses/Orbits: Paranasal sinuses and mastoid air cells are clear. The visualized orbits are unremarkable. Other: No confluent scalp hematoma. IMPRESSION: 1. No acute intracranial abnormality. No skull fracture. 2. Generalized atrophy. Remote right cerebellar lacunar infarct. Electronically Signed   By: Chadwick Colonel M.D.   On: 08/08/2023 17:24   DG Chest 2 View Result Date: 07/31/2023 CLINICAL DATA:  Shortness of breath. EXAM: CHEST - 2 VIEW COMPARISON:  02/02/2016. FINDINGS: Bilateral lung fields are clear. Bilateral costophrenic angles are clear. Normal cardio-mediastinal silhouette. There is a left sided 3-lead pacemaker. No acute osseous abnormalities. The soft tissues are within normal limits. IMPRESSION: *No active cardiopulmonary disease. Electronically Signed   By: Belynda Brace.D.  On: 07/31/2023 14:19    Labs:  CBC: Recent Labs    08/01/23 0402 08/08/23 1654 08/21/23 1235 08/21/23 1722  WBC 6.5 7.9 6.0 7.0  HGB 14.3 13.9 11.7* 12.4*  HCT 42.3 43.1 34.0* 36.5*  PLT 173 155 237 264    COAGS: Recent Labs    08/01/23 0402  INR 1.1  APTT 24    BMP: Recent Labs    08/08/23 1654 08/21/23 1235 08/21/23 1722 08/23/23 0736  NA 137 140 140 139  K 3.6 3.9 3.4* 3.7  CL 110 114* 114* 111  CO2 16* 19* 15* 17*  GLUCOSE 74 87 128* 88  BUN 34* 28* 32* 21  CALCIUM  8.7* 8.8* 8.8* 8.6*  CREATININE 1.87* 1.84* 2.12* 1.83*  GFRNONAA 37* 38* 32* 38*    LIVER FUNCTION TESTS: Recent Labs    08/01/23 0402 08/08/23 1654 08/21/23 1722  BILITOT 1.2 1.0 1.3*  AST 21 22 37  ALT 32 30 33  ALKPHOS 42 49 57  PROT 6.0* 6.1* 6.4*  ALBUMIN  3.3* 3.3* 3.5      Assessment and Plan: Symptomatic left intracranial ICA stenosis/occlusion Plan for cerebral angiogram with intent to treat if felt safe to cross stenosis. Pt understands may be complete occlusion and no intervention could be performed. Will stop Xarelto  and begin ASA/Brilinta. Planning to coordinate with anesthesia for case to be done Mon 6/16. Labs reviewed. Risks and benefits of cerebral angiogram with possible stent angioplasty of carotid stenosis were discussed with the patient including, but not limited to bleeding, infection, vascular injury or contrast induced renal failure.   This interventional procedure involves the use of X-rays and because of the nature of the planned procedure, it is possible that we will have prolonged use of X-ray fluoroscopy.  Potential radiation risks to you include (but are not limited to) the following: - A slightly elevated risk for cancer several years later in life. This risk is typically less than 0.5% percent. This risk is low in comparison to the normal incidence of human cancer, which is 33% for women and 50% for men according to the [employer]. - Radiation induced injury can include skin redness, resembling a rash, tissue breakdown / ulcers and hair loss (which can be temporary or permanent).   The likelihood of either of these occurring depends on the difficulty of the procedure and whether you are sensitive to radiation due to previous procedures, disease, or genetic conditions.   IF your procedure requires a prolonged use of radiation, you will be notified and given written instructions for further action.  It is your responsibility to monitor the irradiated area for the 2 weeks following the procedure and to notify your physician if you are concerned that you have suffered a radiation induced injury.    All of the patient's questions were answered, patient is agreeable to proceed.  Consent signed and in  chart.    Electronically Signed: Prudence Brown, PA-C 08/23/2023, 3:30 PM   I spent a total of 40 minutes in face to face in clinical consultation, greater than 50% of which was counseling/coordinating care for left ICA stenosis

## 2023-08-23 NOTE — Progress Notes (Signed)
 PHARMACY - ANTICOAGULATION CONSULT NOTE  Pharmacy Consult for Heparin  Indication: stroke (hx afib and DVT - chronic Eliquis)  Allergies  Allergen Reactions   Amiodarone Other (See Comments)    Pt reports vision changes and walking changes    Flecainide  Other (See Comments)    Severe headaches, blurred vision   Carvedilol  Other (See Comments)    Unknown    Dapagliflozin Diarrhea   Eplerenone Other (See Comments)    Unknown    Flomax [Tamsulosin Hcl] Hives, Itching and Rash   Lisinopril  Nausea And Vomiting   Metoprolol  Other (See Comments)    Headaches, dizziness   Sacubitril-Valsartan Nausea And Vomiting   Spironolactone  Swelling and Other (See Comments)    Breast swelling, headaches   Statins Other (See Comments)    Leg cramps   Sulfasalazine Nausea Only    Patient Measurements: Height: 5' 8 (172.7 cm) Weight: 56 kg (123 lb 6.4 oz) IBW/kg (Calculated) : 68.4 HEPARIN  DW (KG): 56  Vital Signs: Temp: 97.4 F (36.3 C) (06/12 1159) Temp Source: Axillary (06/12 1159) BP: 138/71 (06/12 1159) Pulse Rate: 71 (06/12 1159)  Labs: Recent Labs    08/21/23 1235 08/21/23 1722 08/23/23 0736  HGB 11.7* 12.4*  --   HCT 34.0* 36.5*  --   PLT 237 264  --   CREATININE 1.84* 2.12* 1.83*    Estimated Creatinine Clearance: 27.6 mL/min (A) (by C-G formula based on SCr of 1.83 mg/dL (H)).   Medical History: Past Medical History:  Diagnosis Date   A-fib [facility])    AICD (automatic cardioverter/defibrillator) present    Barrett's esophagus 2011   In [place] . Records in Epic   CHF (congestive heart failure) (HCC)    Chronic kidney disease    Coronary atherosclerosis of native coronary artery    Nonobstructive   Essential hypertension, benign    GERD (gastroesophageal reflux disease)    Heart block AV first degree    Chronic   History of kidney stones    Hypercholesterolemia    Mixed hyperlipidemia    NSVT (nonsustained ventricular tachycardia) (HCC)    Holter monitor,  2011; recommended RF ablation by Dr. Nunzio Belch - never required   PAF (paroxysmal atrial fibrillation) [facility])    Recently documented in [facility] June 2016   Pre-diabetes    Rheumatoid arthritis (HCC)    Sinus bradycardia    Symptomatic, acebutolol discontinued   Skin cancer    Sleep apnea    pt denies    Medications:  Scheduled:    stroke: early stages of recovery book   Does not apply Once   fluorescein  1 strip Both Eyes Once   furosemide   40 mg Oral Daily   mexiletine  150 mg Oral BID   pantoprazole   40 mg Oral QPM   [START ON 08/26/2023] rosuvastatin   5 mg Oral Q Sun   tetracaine  1 drop Both Eyes Once   PRN: acetaminophen  **OR** acetaminophen  (TYLENOL ) oral liquid 160 mg/5 mL **OR** acetaminophen , hydrALAZINE , senna-docusate  Assessment: 78 yo male on chronic Eliquis for hx afib and DVT now with acute stroke. Pharmacy consulted to transition to IV heparin .  Goal of Therapy:  Heparin  level 0.3-0.5 units/ml aPTT 66-85 seconds Monitor platelets by anticoagulation protocol: Yes   Plan:  No heparin  bolus Heparin  700 units/hr (~12 units/kg/hr) Check aPTT and HL in 8hrs and daily until correlate, then HL only Continue to monitor H&H and platelets  Armanda Bern, PharmD, BCPS 08/23/2023,3:37 PM  Please check AMION for all [facility]  Pharmacy phone numbers After 10:00 PM, call Main Pharmacy [phone]

## 2023-08-23 NOTE — Progress Notes (Addendum)
 PROGRESS NOTE        PATIENT DETAILS Name: Brent Ray. Age: 78 y.o. Sex: male Date of Birth: 08-08-1945 Admit Date: 08/21/2023 Admitting Physician Pati Bonine, MD ZOX:[id number], Boone Buzzard, MD  Brief Summary: Patient is a 78 y.o.  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   Significant events: 6/10>> admit to [facility] at [facility] 6/11>> transferred to [facility]  Significant studies: 6/10>> CT head: No acute intracranial abnormality. 6/10>> CTA head/neck: Left ICA occluded shortly beyond its origin- to the left paraclinoid level, 50% narrowing of right ICA origin.  Moderate to severe stenosis within the left PCA P2 segment. 6/10>> CT cerebral perfusion study: Negative for acute ischemia. 6/10>> A1c: 5.6 6/10>> UDS: Negative 6/11>> LDL: 54 6/11>> TTE: EF 35-40,+VE regional wall motion, moderate to severe mitral regurgitation  Significant microbiology data: None  Procedures: None  Consults: Neurology  Subjective: Left eye visual field cut has resolved.  Headache has resolved.  Dizziness/lightheadedness has resolved.  Objective: Vitals: Blood pressure 128/84, pulse 70, temperature 97.7 F (36.5 C), temperature source Oral, resp. rate 20, height 5' 8 (1.727 m), weight 56 kg, SpO2 100%.   Exam: Gen Exam:Alert awake-not in any distress HEENT:atraumatic, normocephalic Chest: B/L clear to auscultation anteriorly CVS:S1S2 regular Abdomen:soft non tender, non distended Extremities:no edema Neurology: Non focal Skin: no rash  Pertinent Labs/Radiology:    Latest Ref Rng & Units  08/21/2023    5:22 PM 08/21/2023   12:35 PM 08/08/2023    4:54 PM  CBC  WBC 4.0 - 10.5 K/uL 7.0  6.0  7.9   Hemoglobin 13.0 - 17.0 g/dL 54.0  98.1  19.1   Hematocrit 39.0 - 52.0 % 36.5  34.0  43.1   Platelets 150 - 400 K/uL 264  237  155     Lab Results  Component Value Date   NA 140 08/21/2023   K 3.4 (L) 08/21/2023   CL 114 (H) 08/21/2023   CO2 15 (L) 08/21/2023      Assessment/Plan: Left visual field cut/dizziness/headache Concern for small stroke not seen on CT imaging-probably thromboembolic given left ICA occlusion Thankfully left visual scotoma/dizziness and headache have all resolved Continue Xarelto  Awaiting MRI brain-other workup as detailed above. Neurology following-await further recommendations Await recommendations from PT/OT  Addendum: MRI +ve for acute CVA-Dr Christiane Cowing has d/w Neuro int radiology-recommenations are to proceed with a inpatient cerebral angiogram to fully evaluate ICA-as patient had a acute cva while on anticoagulation. Will start Heparin  per pharmacy protocol-24 hours after last xarelto  dose.   Chronic HFrEF Euvolemic Continue furosemide  Per prior notes-GDMT limited due to intolerance to multiple medications including beta-blocker/Aldactone /digoxin/SGL 2 inhibitors.  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 Continue Xarelto   HTN BP stable-allowing permissive hypertension due to concern for CVA Losartan remains on hold  HLD Statin  CKD stage IIIb Creatinine close to  baseline  Rheumatoid arthritis On methotrexate  at home currently on hold  Code status:   Code Status: Full Code   DVT Prophylaxis: Rivaroxaban  (XARELTO ) tablet 15 mg     Family Communication: None at bedside   Disposition Plan: Status is: Observation The patient will require care spanning > 2 midnights and should be moved to inpatient because: Severity of illness   Planned Discharge Destination:Home   Diet: Diet Order             Diet Heart Fluid consistency: Thin  Diet effective now                     Antimicrobial agents: Anti-infectives (From admission, onward)    None        MEDICATIONS: Scheduled Meds:   stroke: early stages of recovery book   Does not apply Once   fluorescein  1 strip Both Eyes Once   furosemide   40 mg Oral Daily   mexiletine  150 mg Oral BID   pantoprazole   40 mg Oral QPM   Rivaroxaban   15 mg Oral Q1500   [START ON 08/26/2023] rosuvastatin   5 mg Oral Q Sun   tetracaine  1 drop Both Eyes Once   Continuous Infusions: PRN Meds:.acetaminophen  **OR** acetaminophen  (TYLENOL ) oral liquid 160 mg/5 mL **OR** acetaminophen , hydrALAZINE , senna-docusate   I have personally reviewed following labs and imaging studies  LABORATORY DATA: CBC: Recent Labs  Lab 08/21/23 1235 08/21/23 1722  WBC 6.0 7.0  NEUTROABS  --  5.7  HGB 11.7* 12.4*  HCT 34.0* 36.5*  MCV 99.4 101.1*  PLT 237 264    Basic Metabolic Panel: Recent Labs  Lab 08/21/23 1235 08/21/23 1722  NA 140 140  K 3.9 3.4*  CL 114* 114*  CO2 19* 15*  GLUCOSE 87 128*  BUN 28* 32*  CREATININE 1.84* 2.12*  CALCIUM  8.8* 8.8*    GFR: Estimated Creatinine Clearance: 23.8 mL/min (A) (by C-G formula based on SCr of 2.12 mg/dL (H)).  Liver Function Tests: Recent Labs  Lab 08/21/23 1722  AST 37  ALT 33  ALKPHOS 57  BILITOT 1.3*  PROT 6.4*  ALBUMIN  3.5   No results for input(s): LIPASE, AMYLASE in the last 168 hours. No results for input(s): AMMONIA in the  last 168 hours.  Coagulation Profile: No results for input(s): INR, PROTIME in the last 168 hours.  Cardiac Enzymes: No results for input(s): CKTOTAL, CKMB, CKMBINDEX, TROPONINI in the last 168 hours.  BNP (last 3 results) No results for input(s): PROBNP in the last 8760 hours.  Lipid Profile: Recent Labs    08/22/23 0451  CHOL 125  HDL 62  LDLCALC 54  TRIG 43  CHOLHDL 2.0    Thyroid  Function Tests: No results for input(s): TSH, T4TOTAL, FREET4, T3FREE, THYROIDAB in the last 72 hours.  Anemia Panel: No results for input(s): VITAMINB12, FOLATE, FERRITIN, TIBC, IRON, RETICCTPCT in the last 72 hours.  Urine analysis: No results found for: COLORURINE, APPEARANCEUR, LABSPEC, PHURINE, GLUCOSEU, HGBUR, BILIRUBINUR, KETONESUR, PROTEINUR, UROBILINOGEN, NITRITE, LEUKOCYTESUR  Sepsis Labs: Lactic Acid, Venous No results found for: LATICACIDVEN  MICROBIOLOGY: No results found for this or any previous visit (from the past 240 hours).  RADIOLOGY STUDIES/RESULTS: CUP PACEART REMOTE DEVICE CHECK Result Date: 08/22/2023 ICD Scheduled remote reviewed. Normal device function.  Presenting  rhythm: BIV paced HF diagnostics abnormal this monitoring period. BIV pacing at 91%; stable. No new events since 4/4 programmer session, prior to that 6 NSVT events, longest x 4 sec; V rates >200 bpm.  Likely ventricular driven. Next remote 91 days. [place], CV[facility]  ECHOCARDIOGRAM COMPLETE Result Date: 08/22/2023    ECHOCARDIOGRAM REPORT   Patient Name:   Brent Ray. Date of Exam: 08/22/2023 Medical Rec #:  [id number]            Height:       68.0 in Accession #:    [id number]           Weight:       123.4 lb Date of Birth:  [birth year]            BSA:          1.665 m Patient Age:    77 years             BP:           142/87 mmHg Patient Gender: M                    HR:           83 bpm. Exam Location:  [facility] Procedure: 2D Echo, Cardiac  Doppler, Color Doppler and Strain Analysis (Both            Spectral and Color Flow Doppler were utilized during procedure). Indications:    stroke I63.9  History:        Patient has prior history of Echocardiogram examinations, most                 recent 01/29/2023. CHF, CAD; Risk Factors:Hypertension and                 Dyslipidemia.  Sonographer:    Kip Peon RDCS Referring Phys: [id number] Lao [facility]  Sonographer Comments: Global longitudinal strain was attempted. IMPRESSIONS  1. Left ventricular ejection fraction, by estimation, is 35 to 40%. The left ventricle has moderately decreased function. The left ventricle demonstrates regional wall motion abnormalities (see scoring diagram/findings for description). There is mild concentric left ventricular hypertrophy. Left ventricular diastolic parameters are indeterminate.  2. Right ventricular systolic function is normal. The right ventricular size is normal. There is mildly elevated pulmonary artery systolic pressure. The estimated right ventricular systolic pressure is 38.0 mmHg.  3. Left atrial size was moderately dilated.  4. The mitral valve is degenerative with mild posterior leaflet prolapse. Moderate to severe mitral valve regurgitation.  5. Tricuspid valve regurgitation is mild to moderate.  6. The aortic valve is tricuspid. There is mild calcification of the aortic valve. Aortic valve regurgitation is trivial. No aortic stenosis is present.  7. The inferior vena cava is normal in size with greater than 50% respiratory variability, suggesting right atrial pressure of 3 mmHg.  8. Evidence of probable atrial level shunting detected by color flow Doppler, left to right at mid interatrial septum. Suggest agitated saline study. Comparison(s): Prior images reviewed side by side. LVEF 35-40% with stable WMA and septal motion consistent with RV pacing. Mild posterior leaflet mitral prolapse with moderate to severe mtiral regurgitation. 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 FINDINGS  Left Ventricle: Left ventricular ejection fraction, by estimation, is 35 to 40%. The left ventricle has moderately decreased function. The left ventricle demonstrates regional wall motion abnormalities. The left ventricular internal cavity size was normal  in size. There is mild concentric left ventricular hypertrophy. Abnormal (paradoxical) septal motion, consistent with RV pacemaker. Left ventricular diastolic parameters are indeterminate.  LV Wall Scoring: The entire inferior wall, posterior wall, and basal anterolateral segment are hypokinetic. The entire anterior wall, entire septum, apical lateral segment, mid anterolateral segment, and apex are normal. Right Ventricle: The right ventricular size is normal. No increase in right ventricular wall thickness. Right ventricular systolic function is normal. There is mildly elevated pulmonary artery systolic pressure. The tricuspid regurgitant velocity is 2.96  m/s, and with an assumed right atrial pressure of 3 mmHg, the estimated right ventricular systolic pressure is 38.0 mmHg. Left Atrium: Left atrial size was moderately dilated. Right Atrium: Right atrial size was normal in size. Pericardium: There is no evidence of pericardial effusion. Mitral Valve: The mitral valve is degenerative in appearance. There is mild thickening of the mitral valve leaflet(s). Moderate to severe mitral valve regurgitation, with posteriorly-directed jet. Tricuspid Valve: The tricuspid valve is grossly normal. Tricuspid valve regurgitation is mild to moderate. Aortic Valve: The aortic valve is tricuspid. There is mild calcification of the aortic valve. There is mild aortic valve annular calcification. Aortic valve regurgitation is trivial. No aortic stenosis is present. Pulmonic Valve: The pulmonic valve was grossly normal. Pulmonic valve regurgitation is trivial. Aorta: The aortic root and ascending aorta are structurally  normal, with no evidence of dilitation. Venous: The inferior vena cava is normal in size with greater than 50% respiratory variability, suggesting right atrial pressure of 3 mmHg. IAS/Shunts: Evidence of atrial level shunting detected by color flow Doppler. Additional Comments: 3D was performed not requiring image post processing on an independent workstation and was indeterminate. A device lead is visualized.  LEFT VENTRICLE PLAX 2D LVIDd:         5.10 cm LVIDs:         4.40 cm LV PW:         1.40 cm 3D Volume EF LV IVS:        1.00 cm LV 3D EDV:   123.82 ml                        LV 3D ESV:   48.23 ml RIGHT VENTRICLE          IVC RV Basal diam:  3.70 cm  IVC diam: 1.60 cm LEFT ATRIUM             Index        RIGHT ATRIUM           Index LA diam:        4.10 cm 2.46 cm/m   RA Area:     19.90 cm LA Vol (A2C):   58.0 ml 34.84 ml/m  RA Volume:   50.80 ml  30.52 ml/m LA Vol (A4C):   84.5 ml 50.76 ml/m LA Biplane Vol: 73.4 ml 44.10 ml/m  AORTIC VALVE LVOT Vmax:   75.95 cm/s LVOT Vmean:  46.700 cm/s LVOT VTI:    0.125 m  AORTA Ao Asc diam: 3.20 cm MITRAL VALVE               TRICUSPID VALVE MV Area (PHT): 3.12 cm    TR Peak grad:   35.0 mmHg MV Decel Time: 243 msec    TR Vmax:        296.00 cm/s MR Peak grad: 87.0 mmHg MR Mean grad: 62.0 mmHg    SHUNTS MR Vmax:  466.33 cm/s  Systemic VTI: 0.12 m MR Vmean:     367.0 cm/s MV E velocity: 70.60 cm/s MV A velocity: 30.70 cm/s MV E/A ratio:  2.30 Teddie Favre MD Electronically signed by Teddie Favre MD Signature Date/Time: 08/22/2023/11:28:01 AM    Final    CT CEREBRAL PERFUSION W CONTRAST Result Date: 08/21/2023 CLINICAL DATA:  Initial evaluation for acute visual disturbance. EXAM: CT PERFUSION BRAIN TECHNIQUE: Multiphase CT imaging of the brain was performed following IV bolus contrast injection. Subsequent parametric perfusion maps were calculated using RAPID software. RADIATION DOSE REDUCTION: This exam was performed according to the departmental  dose-optimization program which includes automated exposure control, adjustment of the mA and/or kV according to patient size and/or use of iterative reconstruction technique. CONTRAST:  32mL OMNIPAQUE IOHEXOL 350 MG/ML SOLN COMPARISON:  Prior CTs from earlier the same day. FINDINGS: CT Brain Perfusion Findings: CBF (<30%) Volume: 0mL Perfusion (Tmax>6.0s) volume: 0mL Mismatch Volume: 0mL ASPECTS on noncontrast CT Head: 10 at 5:43 p.m. today. Infarct Core: 0 mL Infarction Location:Negative CT perfusion for acute ischemia. Mildly delayed perfusion of T-max greater than 4 seconds in the watershed zones of the left cerebral hemispheres compared to the right, presumably related to the occluded left ICA as seen on prior CTA. IMPRESSION: 1. Negative CT perfusion for acute ischemia. 2. Mildly delayed perfusion of T-max greater than 4 seconds in the watershed zones of the left cerebral hemisphere compared to the right, presumably related to the occluded left ICA as seen on prior CTA. Electronically Signed   By: Virgia Griffins M.D.   On: 08/21/2023 21:04   CT ANGIO HEAD NECK W WO CM Addendum Date: 08/21/2023 ADDENDUM REPORT: 08/21/2023 19:32 ADDENDUM: CTA neck impression #1 and CTA head impression #1 called by telephone on 08/21/2023 at 7:14 pm to provider Dr. Alecia Ames, who verbally acknowledged these results. Electronically Signed   By: Bascom Lily D.O.   On: 08/21/2023 19:32   Result Date: 08/21/2023 CLINICAL DATA:  Provided history: Neuro deficit, acute, stroke suspected. Left-sided headache, vision changes, dizziness. EXAM: CT ANGIOGRAPHY HEAD AND NECK WITH AND WITHOUT CONTRAST TECHNIQUE: Multidetector CT imaging of the head and neck was performed using the standard protocol during bolus administration of intravenous contrast. Multiplanar CT image reconstructions and MIPs were obtained to evaluate the vascular anatomy. Carotid stenosis measurements (when applicable) are obtained utilizing NASCET criteria,  using the distal internal carotid diameter as the denominator. RADIATION DOSE REDUCTION: This exam was performed according to the departmental dose-optimization program which includes automated exposure control, adjustment of the mA and/or kV according to patient size and/or use of iterative reconstruction technique. CONTRAST:  60mL OMNIPAQUE IOHEXOL 350 MG/ML SOLN COMPARISON:  Non-contrast head CT performed earlier today 08/21/2023. FINDINGS: CTA NECK FINDINGS Aortic arch: Standard aortic branching. Atherosclerotic plaque within the visualized aortic arch and proximal major branch vessels of the neck. No hemodynamically significant innominate or proximal subclavian artery stenosis. Right carotid system: CCA and ICA patent within the neck. Atherosclerotic plaque about the carotid bifurcation, within the proximal ICA and within the proximal ECA. Estimated 50% stenosis at the ICA origin. Severe stenosis of the proximal ECA. Left carotid system: The common carotid artery is patent within the neck. The internal carotid artery becomes occluded shortly beyond its origin and remains occluded throughout the remainder of the neck. Calcified atherosclerotic plaque at the ECA origin resulting in at least moderate stenosis. Vertebral arteries: Codominant and patent within the neck. Scattered atherosclerotic plaque within the cervical left vertebral artery. Most notably,  calcified plaque within the proximal V3 segment results in up to moderate stenosis. Skeleton: Dextrocurvature of the cervical spine. Cervical spondylosis. No acute fracture or aggressive osseous lesion. Other neck: No neck mass or cervical lymphadenopathy. Upper chest: No consolidation within the imaged lung apices. Review of the MIP images confirms the above findings CTA HEAD FINDINGS Anterior circulation: The intracranial left internal carotid artery remains occluded to the paraclinoid level. Reconstitution of enhancement within the supraclinoid left ICA. The  intracranial right internal carotid artery is patent. Calcified atherosclerotic plaque within this vessel with no more than mild stenosis. The M1 middle cerebral arteries are patent. Atherosclerotic irregularity of the M2 and more distal MCA branches. No M2 proximal branch occlusion or high-grade proximal stenosis. The anterior cerebral arteries are patent. Azygous configuration of the anterior cerebral arteries. Mildly hypoplastic left A1 segment. No intracranial aneurysm is identified. Posterior circulation: The intracranial vertebral arteries are patent. The basilar artery is patent. The posterior cerebral arteries are patent. Atherosclerotic irregularity of both vessels. Most notably, there is a moderate/severe stenosis within the left PCA P2 segment. A left posterior communicating artery is present. The right posterior communicating artery is diminutive or absent. Venous sinuses: Within the limitations of contrast timing, no convincing thrombus. Anatomic variants: As described grade Review of the MIP images confirms the above findings Attempts are being made to reach the ordering provider at this time. IMPRESSION: CTA neck: 1. 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 This is age-indeterminate. 2. The right common and internal carotid arteries are patent within the neck. Estimated 50% atherosclerotic narrowing at the right ICA origin. 3. Vertebral arteries patent within the neck. Up to moderate atherosclerotic narrowing of the left vertebral artery V3 segment. 4. Aortic Atherosclerosis (ICD10-I70.0). CTA head: 1. 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 This is age-indeterminate. Reconstitution of enhancement within the supraclinoid left ICA. 2. No proximal intracranial large vessel occlusion identified elsewhere. 3. Background intracranial atherosclerotic disease as described.  Most notably, there is a moderate/severe stenosis within the left PCA P2 segment. Electronically Signed: By: Bascom Lily D.O. On: 08/21/2023 19:04   CT HEAD CODE STROKE WO CONTRAST Result Date: 08/21/2023 CLINICAL DATA:  Code stroke. Neuro deficit, acute, stroke suspected. Dizziness. Eye pain on the left, headache, nausea. EXAM: CT HEAD WITHOUT CONTRAST TECHNIQUE: Contiguous axial images were obtained from the base of the skull through the vertex without intravenous contrast. RADIATION DOSE REDUCTION: This exam was performed according to the departmental dose-optimization program which includes automated exposure control, adjustment of the mA and/or kV according to patient size and/or use of iterative reconstruction technique. COMPARISON:  Head CT 08/08/2023. FINDINGS: Brain: Generalized cerebral atrophy. Prominent perivascular spaces within the right basal ganglia inferiorly. Small chronic infarct again demonstrated within the inferior right cerebellar hemisphere. There is no acute intracranial hemorrhage. No demarcated cortical infarct. No extra-axial fluid collection. No evidence of an intracranial mass. No midline shift. Vascular: No hyperdense vessel. Atherosclerotic calcifications. Skull: No calvarial fracture or aggressive osseous lesion. Sinuses/Orbits: No mass or acute finding within the imaged orbits. No significant paranasal sinus disease at the imaged levels. ASPECTS [facility] Stroke Program Early CT Score) - Ganglionic level infarction (caudate, lentiform nuclei, internal capsule, insula, M1-M3 cortex): 7 - Supraganglionic infarction (M4-M6 cortex): 3 Total score (0-10 with 10 being normal): 10 No evidence of an acute intracranial abnormality. These results were communicated to Dr. Alecia Ames at 5:55 pmon  6/10/2025by text page via the [facility] messaging system. IMPRESSION: 1.  No evidence of an acute intracranial abnormality. 2. 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 3. Cerebral atrophy. Electronically Signed   By: Bascom Lily D.O.   On: 08/21/2023 17:56     LOS: 0 days   Kimberly Penna, MD  [facility]    To contact the attending provider between 7A-7P or the covering provider during after hours 7P-7A, please log into the web site [email or web address] and access using universal [place] password for that web site. If you do not have the password, please call the hospital operator.  08/23/2023, 6:22 AM

## 2023-08-23 NOTE — Evaluation (Signed)
 Physical Therapy Evaluation Patient Details Name: Brent Ray. MRN: [id number] DOB: 09/12/1945 Today's Date: 08/23/2023  History of Present Illness  Patient is 78 y.o. male who presented to [facility] 08/21/23 with dizziness, left visual scotoma.  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 Transferred to [facility] for MRI (due to PPM). 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   Clinical Impression  Brent Ray. is 78 y.o. male admitted with above HPI and diagnosis. Patient is currently limited by functional impairments below (see PT problem list). Patient lives with spouse and is independent at baseline. Currently pt limited by weakness in bil LE and mild balance deficits. Pt requires CGA for safety with ambulation and noted to have difficulty with dynamic challenges. Pt scored 18/24 on DGI, A score of </= 19/24 is predictive of falls in the elderly, but a score of > 22/24 is indicative of safe ambulators. Initiated Stroke education on BEFAST given CTA findings of ICA occlusion and pt had difficulty with memory to recall  acronym immediately after education. Patient will benefit from continued skilled PT interventions to address impairments and progress independence with mobility, recommending OPPT follow up for balance deficits and generalized weakness. Acute PT will follow and progress as able.       If plan is discharge home, recommend the following: A little help with walking and/or transfers;A little help with bathing/dressing/bathroom;Help with stairs or ramp for entrance;Assist for transportation   Can travel by private vehicle        Equipment Recommendations None recommended by PT  Recommendations for Other Services       Functional Status Assessment Patient has had a recent decline in their  functional status and demonstrates the ability to make significant improvements in function in a reasonable and predictable amount of time.     Precautions / Restrictions Precautions Precautions: Fall Recall of Precautions/Restrictions: Intact Precaution/Restrictions Comments: fall ~2 weeks ago- fell after MD appt with fasting blood draw and delayed meal after, sugar dropped to 36 Restrictions Weight Bearing Restrictions Per Provider Order: No      Mobility  Bed Mobility Overal bed mobility: Modified Independent             General bed mobility comments: HOB elevated slightly    Transfers Overall transfer level: Needs assistance Equipment used: None Transfers: Sit to/from Stand Sit to Stand: Supervision           General transfer comment: sup for safety, use of hands to rise and lower    Ambulation/Gait Ambulation/Gait assistance: Contact guard assist, Supervision Gait Distance (Feet): 300 Feet Assistive device: None Gait Pattern/deviations: Step-through pattern, Decreased stride length Gait velocity: decr     General Gait Details: pt steady with simple gait, slight imbalance noted with dynamic challenges. VSS on RA.  Stairs            Wheelchair Mobility     Tilt Bed    Modified Rankin (Stroke Patients Only)       Balance                                 Standardized Balance Assessment Standardized Balance Assessment : Dynamic Gait Index   Dynamic Gait Index Level Surface: Normal Change in Gait Speed: Mild Impairment Gait with Horizontal Head  Turns: Mild Impairment Gait with Vertical Head Turns: Mild Impairment Gait and Pivot Turn: Mild Impairment Step Over Obstacle: Normal Step Around Obstacles: Mild Impairment Steps: Mild Impairment Total Score: 18       Pertinent Vitals/Pain Pain Assessment Pain Assessment: No/denies pain    Home Living Family/patient expects to be discharged to:: Private residence Living  Arrangements: Spouse/significant other Available Help at Discharge: Family Type of Home: House Home Access: Stairs to enter Entrance Stairs-Rails: None Entrance Stairs-Number of Steps: 1   Home Layout: One level;Laundry or work area in basement;Full bath on main level;Able to live on main level with bedroom/bathroom (finished basement) Home Equipment: Rolling Walker (2 wheels);Cane - single point;BSC/3in1;Shower seat;Hand held shower head      Prior Function Prior Level of Function : Independent/Modified Independent                     Extremity/Trunk Assessment   Upper Extremity Assessment Upper Extremity Assessment: Overall WFL for tasks assessed    Lower Extremity Assessment Lower Extremity Assessment: Generalized weakness    Cervical / Trunk Assessment Cervical / Trunk Assessment: Normal  Communication   Communication Communication: No apparent difficulties    Cognition Arousal: Alert Behavior During Therapy: WFL for tasks assessed/performed   PT - Cognitive impairments: No apparent impairments                         Following commands: Intact       Cueing Cueing Techniques: Verbal cues     General Comments      Exercises     Assessment/Plan    PT Assessment Patient needs continued PT services  PT Problem List Decreased strength;Decreased range of motion;Decreased activity tolerance;Decreased balance;Decreased mobility;Decreased cognition;Decreased knowledge of use of DME;Decreased safety awareness;Decreased knowledge of precautions;Cardiopulmonary status limiting activity       PT Treatment Interventions DME instruction;Gait training;Stair training;Functional mobility training;Therapeutic activities;Therapeutic exercise;Balance training;Neuromuscular re-education;Cognitive remediation;Patient/family education    PT Goals (Current goals can be found in the Care Plan section)  Acute Rehab PT Goals Patient Stated Goal: get home back to  routine PT Goal Formulation: With patient Time For Goal Achievement: 09/06/23 Potential to Achieve Goals: Good    Frequency Min 2X/week     Co-evaluation               AM-PAC PT 6 Clicks Mobility  Outcome Measure Help needed turning from your back to your side while in a flat bed without using bedrails?: None Help needed moving from lying on your back to sitting on the side of a flat bed without using bedrails?: None Help needed moving to and from a bed to a chair (including a wheelchair)?: A Little Help needed standing up from a chair using your arms (e.g., wheelchair or bedside chair)?: A Little Help needed to walk in hospital room?: A Little Help needed climbing 3-5 steps with a railing? : A Little 6 Click Score: 20    End of Session Equipment Utilized During Treatment: Gait belt Activity Tolerance: Patient tolerated treatment well Patient left: with call bell/phone within reach;in chair;with chair alarm set;with family/visitor present Nurse Communication: Mobility status PT Visit Diagnosis: Other abnormalities of gait and mobility (R26.89);Unsteadiness on feet (R26.81);History of falling (Z91.81);Other symptoms and signs involving the nervous system (R29.898);Difficulty in walking, not elsewhere classified (R26.2)    Time: 0940-1010 PT Time Calculation (min) (ACUTE ONLY): 30 min   Charges:   PT Evaluation $PT Eval Moderate Complexity:  1 Mod PT Treatments $Gait Training: 8-22 mins PT General Charges $$ ACUTE PT VISIT: 1 Visit         Tish Forge, DPT Acute Rehabilitation Services Office [phone]  08/23/23 1:14 PM

## 2023-08-23 NOTE — CV Procedure (Signed)
  Device system confirmed to be MRI conditional, with implant date > 6 weeks ago, and no evidence of abandoned or epicardial leads in review of most recent CXR  Device last cleared by EP Provider: Suzann Riddle 08/23/2023   Clearance is good through for 1 year as long as parameters remain stable at time of check. If pt undergoes a cardiac device procedure during that time, they should be re-cleared.   Tachy-therapies to be programmed off if applicable with device back to pre-MRI settings after completion of exam.  [employer] - Programming recommendation received through [employer] App/Tablet  Evert Hodgkins, RT  08/23/2023 10:57 AM

## 2023-08-24 ENCOUNTER — Ambulatory Visit: Payer: Self-pay | Admitting: Cardiovascular Disease

## 2023-08-24 DIAGNOSIS — D649 Anemia, unspecified: Secondary | ICD-10-CM | POA: Diagnosis present

## 2023-08-24 DIAGNOSIS — N1832 Chronic kidney disease, stage 3b: Secondary | ICD-10-CM | POA: Diagnosis present

## 2023-08-24 DIAGNOSIS — W19XXXA Unspecified fall, initial encounter: Secondary | ICD-10-CM | POA: Diagnosis not present

## 2023-08-24 DIAGNOSIS — G4733 Obstructive sleep apnea (adult) (pediatric): Secondary | ICD-10-CM | POA: Diagnosis not present

## 2023-08-24 DIAGNOSIS — I071 Rheumatic tricuspid insufficiency: Secondary | ICD-10-CM | POA: Diagnosis present

## 2023-08-24 DIAGNOSIS — I2510 Atherosclerotic heart disease of native coronary artery without angina pectoris: Secondary | ICD-10-CM | POA: Diagnosis present

## 2023-08-24 DIAGNOSIS — Z95810 Presence of automatic (implantable) cardiac defibrillator: Secondary | ICD-10-CM

## 2023-08-24 DIAGNOSIS — R42 Dizziness and giddiness: Secondary | ICD-10-CM | POA: Diagnosis not present

## 2023-08-24 DIAGNOSIS — R4701 Aphasia: Secondary | ICD-10-CM | POA: Diagnosis present

## 2023-08-24 DIAGNOSIS — M069 Rheumatoid arthritis, unspecified: Secondary | ICD-10-CM | POA: Diagnosis present

## 2023-08-24 DIAGNOSIS — R2990 Unspecified symptoms and signs involving the nervous system: Secondary | ICD-10-CM | POA: Diagnosis not present

## 2023-08-24 DIAGNOSIS — G453 Amaurosis fugax: Secondary | ICD-10-CM | POA: Diagnosis present

## 2023-08-24 DIAGNOSIS — Z7901 Long term (current) use of anticoagulants: Secondary | ICD-10-CM | POA: Diagnosis not present

## 2023-08-24 DIAGNOSIS — Z85828 Personal history of other malignant neoplasm of skin: Secondary | ICD-10-CM | POA: Diagnosis not present

## 2023-08-24 DIAGNOSIS — Z8673 Personal history of transient ischemic attack (TIA), and cerebral infarction without residual deficits: Secondary | ICD-10-CM | POA: Diagnosis not present

## 2023-08-24 DIAGNOSIS — I7771 Dissection of carotid artery: Secondary | ICD-10-CM | POA: Diagnosis not present

## 2023-08-24 DIAGNOSIS — R29700 NIHSS score 0: Secondary | ICD-10-CM | POA: Diagnosis not present

## 2023-08-24 DIAGNOSIS — R29706 NIHSS score 6: Secondary | ICD-10-CM | POA: Diagnosis present

## 2023-08-24 DIAGNOSIS — E782 Mixed hyperlipidemia: Secondary | ICD-10-CM | POA: Diagnosis present

## 2023-08-24 DIAGNOSIS — I480 Paroxysmal atrial fibrillation: Secondary | ICD-10-CM | POA: Diagnosis not present

## 2023-08-24 DIAGNOSIS — I63432 Cerebral infarction due to embolism of left posterior cerebral artery: Secondary | ICD-10-CM | POA: Diagnosis not present

## 2023-08-24 DIAGNOSIS — I4892 Unspecified atrial flutter: Secondary | ICD-10-CM | POA: Diagnosis present

## 2023-08-24 DIAGNOSIS — I428 Other cardiomyopathies: Secondary | ICD-10-CM | POA: Diagnosis present

## 2023-08-24 DIAGNOSIS — I6522 Occlusion and stenosis of left carotid artery: Secondary | ICD-10-CM | POA: Diagnosis present

## 2023-08-24 DIAGNOSIS — I5022 Chronic systolic (congestive) heart failure: Secondary | ICD-10-CM | POA: Diagnosis present

## 2023-08-24 DIAGNOSIS — Z79899 Other long term (current) drug therapy: Secondary | ICD-10-CM | POA: Diagnosis not present

## 2023-08-24 DIAGNOSIS — I63532 Cerebral infarction due to unspecified occlusion or stenosis of left posterior cerebral artery: Secondary | ICD-10-CM | POA: Diagnosis present

## 2023-08-24 DIAGNOSIS — H539 Unspecified visual disturbance: Secondary | ICD-10-CM | POA: Diagnosis not present

## 2023-08-24 DIAGNOSIS — I4821 Permanent atrial fibrillation: Secondary | ICD-10-CM | POA: Diagnosis present

## 2023-08-24 DIAGNOSIS — I4891 Unspecified atrial fibrillation: Secondary | ICD-10-CM | POA: Diagnosis not present

## 2023-08-24 DIAGNOSIS — Z7982 Long term (current) use of aspirin: Secondary | ICD-10-CM | POA: Diagnosis not present

## 2023-08-24 DIAGNOSIS — Z86718 Personal history of other venous thrombosis and embolism: Secondary | ICD-10-CM | POA: Diagnosis not present

## 2023-08-24 DIAGNOSIS — I771 Stricture of artery: Secondary | ICD-10-CM | POA: Diagnosis not present

## 2023-08-24 DIAGNOSIS — E1122 Type 2 diabetes mellitus with diabetic chronic kidney disease: Secondary | ICD-10-CM | POA: Diagnosis present

## 2023-08-24 DIAGNOSIS — I63512 Cerebral infarction due to unspecified occlusion or stenosis of left middle cerebral artery: Secondary | ICD-10-CM | POA: Diagnosis not present

## 2023-08-24 DIAGNOSIS — I10 Essential (primary) hypertension: Secondary | ICD-10-CM | POA: Diagnosis not present

## 2023-08-24 DIAGNOSIS — N183 Chronic kidney disease, stage 3 unspecified: Secondary | ICD-10-CM | POA: Diagnosis not present

## 2023-08-24 DIAGNOSIS — I130 Hypertensive heart and chronic kidney disease with heart failure and stage 1 through stage 4 chronic kidney disease, or unspecified chronic kidney disease: Secondary | ICD-10-CM | POA: Diagnosis present

## 2023-08-24 DIAGNOSIS — G8191 Hemiplegia, unspecified affecting right dominant side: Secondary | ICD-10-CM | POA: Diagnosis present

## 2023-08-24 LAB — CBC
HCT: 37.5 % — ABNORMAL LOW (ref 39.0–52.0)
Hemoglobin: 12.5 g/dL — ABNORMAL LOW (ref 13.0–17.0)
MCH: 32.8 pg (ref 26.0–34.0)
MCHC: 33.3 g/dL (ref 30.0–36.0)
MCV: 98.4 fL (ref 80.0–100.0)
Platelets: 222 10*3/uL (ref 150–400)
RBC: 3.81 MIL/uL — ABNORMAL LOW (ref 4.22–5.81)
RDW: 14.7 % (ref 11.5–15.5)
WBC: 4.8 10*3/uL (ref 4.0–10.5)
nRBC: 0 % (ref 0.0–0.2)

## 2023-08-24 LAB — APTT
aPTT: 54 s — ABNORMAL HIGH (ref 24–36)
aPTT: 59 s — ABNORMAL HIGH (ref 24–36)

## 2023-08-24 LAB — HEPARIN LEVEL (UNFRACTIONATED)
Heparin Unfractionated: 0.67 [IU]/mL (ref 0.30–0.70)
Heparin Unfractionated: 0.44 [IU]/mL (ref 0.30–0.70)

## 2023-08-24 MED ORDER — MEDIHONEY WOUND/BURN DRESSING EX PSTE
1.0000 | PASTE | Freq: Every day | CUTANEOUS | Status: DC
  Administered 2023-08-24 – 2023-08-28 (×3): 1 via TOPICAL
  Filled 2023-08-24 (×2): qty 44

## 2023-08-24 MED ORDER — HEPARIN (PORCINE) 25000 UT/250ML-% IV SOLN
950.0000 [IU]/h | INTRAVENOUS | Status: DC
  Administered 2023-08-24: 950 [IU]/h via INTRAVENOUS
  Filled 2023-08-24: qty 250

## 2023-08-24 MED ORDER — TICAGRELOR 90 MG PO TABS
90.0000 mg | ORAL_TABLET | Freq: Two times a day (BID) | ORAL | Status: DC
  Administered 2023-08-24 – 2023-08-27 (×7): 90 mg via ORAL
  Filled 2023-08-24 (×7): qty 1

## 2023-08-24 MED ORDER — ASPIRIN 81 MG PO TBEC
81.0000 mg | DELAYED_RELEASE_TABLET | Freq: Every day | ORAL | Status: DC
  Administered 2023-08-24 – 2023-08-27 (×4): 81 mg via ORAL
  Filled 2023-08-24 (×4): qty 1

## 2023-08-24 NOTE — Progress Notes (Signed)
 STROKE TEAM PROGRESS NOTE   SUBJECTIVE (INTERVAL HISTORY) His wife is at the bedside.  Pt lying in bed, no acute event overnight, vision intact. On heparin  IV. IR on board, plan for angio Monday with intention to treat if ICA still open.     OBJECTIVE Temp:  [97.5 F (36.4 C)-98 F (36.7 C)] 97.5 F (36.4 C) (06/13 0829) Pulse Rate:  [90] 90 (06/13 0829) Cardiac Rhythm: Ventricular paced (06/13 0747) Resp:  [12-18] 17 (06/13 0829) BP: (123-149)/(59-81) 147/73 (06/13 0829) SpO2:  [95 %-96 %] 96 % (06/13 0000)  No results for input(s): GLUCAP in the last 168 hours. Recent Labs  Lab 08/21/23 1235 08/21/23 1722 08/23/23 0736  NA 140 140 139  K 3.9 3.4* 3.7  CL 114* 114* 111  CO2 19* 15* 17*  GLUCOSE 87 128* 88  BUN 28* 32* 21  CREATININE 1.84* 2.12* 1.83*  CALCIUM  8.8* 8.8* 8.6*  MG  --   --  1.9   Recent Labs  Lab 08/21/23 1722  AST 37  ALT 33  ALKPHOS 57  BILITOT 1.3*  PROT 6.4*  ALBUMIN  3.5   Recent Labs  Lab 08/21/23 1235 08/21/23 1722 08/24/23 0647  WBC 6.0 7.0 4.8  NEUTROABS  --  5.7  --   HGB 11.7* 12.4* 12.5*  HCT 34.0* 36.5* 37.5*  MCV 99.4 101.1* 98.4  PLT 237 264 222   No results for input(s): CKTOTAL, CKMB, CKMBINDEX, TROPONINI in the last 168 hours. No results for input(s): LABPROT, INR in the last 72 hours. No results for input(s): COLORURINE, LABSPEC, PHURINE, GLUCOSEU, HGBUR, BILIRUBINUR, KETONESUR, PROTEINUR, UROBILINOGEN, NITRITE, LEUKOCYTESUR in the last 72 hours.  Invalid input(s): APPERANCEUR     Component Value Date/Time   CHOL 125 08/22/2023 0451   TRIG 43 08/22/2023 0451   HDL 62 08/22/2023 0451   CHOLHDL 2.0 08/22/2023 0451   VLDL 9 08/22/2023 0451   LDLCALC 54 08/22/2023 0451   Lab Results  Component Value Date   HGBA1C 5.6 08/21/2023      Component Value Date/Time   LABOPIA NONE DETECTED 08/21/2023 1956   COCAINSCRNUR NONE DETECTED 08/21/2023 1956   LABBENZ NONE DETECTED  08/21/2023 1956   AMPHETMU NONE DETECTED 08/21/2023 1956   THCU NONE DETECTED 08/21/2023 1956   LABBARB NONE DETECTED 08/21/2023 1956    Recent Labs  Lab 08/21/23 1722  ETH <15    I have personally reviewed the radiological images below and agree with the radiology interpretations.  MR BRAIN WO CONTRAST Result Date: 08/23/2023 CLINICAL DATA:  Provided history: Vision changes. Headache. Dizziness. EXAM: MRI HEAD WITHOUT CONTRAST TECHNIQUE: Multiplanar, multiecho pulse sequences of the brain and surrounding structures were obtained without intravenous contrast. COMPARISON:  Non-contrast head CT, CT angiogram head/neck and CT perfusion head 08/21/2023. FINDINGS: Brain: Mild generalized cerebral atrophy. 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 Multifocal T2 FLAIR hyperintense signal abnormality within the cerebral white matter, nonspecific but compatible with moderate chronic small vessel ischemic disease. Mild chronic small vessel ischemic changes within the pons. Small T2 hyperintense focus within the right caudate head likely reflecting a prominent perivascular space. Prominent perivascular space also present within the right basal ganglia inferiorly. 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 Nonspecific punctate chronic microhemorrhage within medial the right cerebellar hemisphere. No evidence of an intracranial mass. No extra-axial fluid collection. No midline shift. 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 Skull and upper cervical spine: No focal worrisome marrow lesion. Sinuses/Orbits: No mass or acute finding within the imaged orbits. Prior but ocular lens replacement. No significant paranasal sinus disease. IMPRESSION: 1. 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 2.  Known occlusion of the left internal carotid artery. 3. Chronic small vessel ischemic changes which are moderate in the cerebral white matter and mild in the pons. 4. Small chronic infarct within the right cerebellar hemisphere. 5. Mild generalized cerebral atrophy. Electronically Signed   By: Bascom Lily D.O.   On: 08/23/2023 13:22   CUP PACEART REMOTE DEVICE CHECK Result Date: 08/22/2023 ICD Scheduled remote reviewed. Normal device function.  Presenting rhythm: BIV paced HF diagnostics abnormal this monitoring period. BIV pacing at 91%; stable. No new events since 4/4 programmer session, prior to that 6 NSVT events, longest x 4 sec; V rates >200 bpm.  Likely ventricular driven. Next remote 91 days. Capac, CVRS  ECHOCARDIOGRAM COMPLETE Result Date: 08/22/2023    ECHOCARDIOGRAM REPORT   Patient Name:   Brent Ray. Date of Exam: 08/22/2023 Medical Rec #:  [id number]            Height:       68.0 in Accession #:    [id number]           Weight:       123.4 lb Date of Birth:  07-30-1945            BSA:          1.665 m Patient Age:    78 years             BP:           142/87 mmHg Patient Gender: M                    HR:           83 bpm. Exam Location:  [facility] Procedure: 2D Echo, Cardiac Doppler, Color Doppler and Strain Analysis (Both            Spectral and Color Flow Doppler were utilized during procedure). Indications:    stroke I63.9  History:        Patient has prior history of Echocardiogram examinations, most                 recent 01/29/2023. CHF, CAD; Risk Factors:Hypertension and                 Dyslipidemia.  Sonographer:    Kip Peon RDCS Referring Phys: [id number] [place] [facility]  Sonographer Comments: Global longitudinal strain was attempted. IMPRESSIONS  1. Left ventricular ejection fraction, by estimation, is 35 to 40%. The left ventricle has moderately decreased function. The left ventricle demonstrates regional wall motion abnormalities (see scoring diagram/findings for  description). There is mild concentric left ventricular hypertrophy. Left ventricular diastolic parameters are indeterminate.  2. Right ventricular systolic function is normal. The right ventricular size is normal. There is mildly elevated pulmonary artery systolic pressure. The estimated right ventricular systolic pressure is 38.0 mmHg.  3. Left atrial size was moderately dilated.  4. The mitral valve is degenerative with mild posterior leaflet prolapse. Moderate to severe mitral valve regurgitation.  5. Tricuspid valve regurgitation is mild to moderate.  6. The aortic valve is tricuspid. There is mild calcification of the aortic valve. Aortic valve regurgitation is trivial. No aortic stenosis  is present.  7. The inferior vena cava is normal in size with greater than 50% respiratory variability, suggesting right atrial pressure of 3 mmHg.  8. Evidence of probable atrial level shunting detected by color flow Doppler, left to right at mid interatrial septum. Suggest agitated saline study. Comparison(s): Prior images reviewed side by side. LVEF 35-40% with stable WMA and septal motion consistent with RV pacing. Mild posterior leaflet mitral prolapse with moderate to severe mtiral regurgitation. 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 FINDINGS  Left Ventricle: Left ventricular ejection fraction, by estimation, is 35 to 40%. The left ventricle has moderately decreased function. The left ventricle demonstrates regional wall motion abnormalities. The left ventricular internal cavity size was normal in size. There is mild concentric left ventricular hypertrophy. Abnormal (paradoxical) septal motion, consistent with RV pacemaker. Left ventricular diastolic parameters are indeterminate.  LV Wall Scoring: The entire inferior wall, posterior wall, and basal anterolateral segment are hypokinetic. The entire anterior wall, entire septum, apical lateral segment, mid anterolateral segment, and apex  are normal. Right Ventricle: The right ventricular size is normal. No increase in right ventricular wall thickness. Right ventricular systolic function is normal. There is mildly elevated pulmonary artery systolic pressure. The tricuspid regurgitant velocity is 2.96  m/s, and with an assumed right atrial pressure of 3 mmHg, the estimated right ventricular systolic pressure is 38.0 mmHg. Left Atrium: Left atrial size was moderately dilated. Right Atrium: Right atrial size was normal in size. Pericardium: There is no evidence of pericardial effusion. Mitral Valve: The mitral valve is degenerative in appearance. There is mild thickening of the mitral valve leaflet(s). Moderate to severe mitral valve regurgitation, with posteriorly-directed jet. Tricuspid Valve: The tricuspid valve is grossly normal. Tricuspid valve regurgitation is mild to moderate. Aortic Valve: The aortic valve is tricuspid. There is mild calcification of the aortic valve. There is mild aortic valve annular calcification. Aortic valve regurgitation is trivial. No aortic stenosis is present. Pulmonic Valve: The pulmonic valve was grossly normal. Pulmonic valve regurgitation is trivial. Aorta: The aortic root and ascending aorta are structurally normal, with no evidence of dilitation. Venous: The inferior vena cava is normal in size with greater than 50% respiratory variability, suggesting right atrial pressure of 3 mmHg. IAS/Shunts: Evidence of atrial level shunting detected by color flow Doppler. Additional Comments: 3D was performed not requiring image post processing on an independent workstation and was indeterminate. A device lead is visualized.  LEFT VENTRICLE PLAX 2D LVIDd:         5.10 cm LVIDs:         4.40 cm LV PW:         1.40 cm 3D Volume EF LV IVS:        1.00 cm LV 3D EDV:   123.82 ml                        LV 3D ESV:   48.23 ml RIGHT VENTRICLE          IVC RV Basal diam:  3.70 cm  IVC diam: 1.60 cm LEFT ATRIUM             Index         RIGHT ATRIUM           Index LA diam:        4.10 cm 2.46 cm/m   RA Area:     19.90 cm LA Vol (A2C):   58.0 ml  34.84 ml/m  RA Volume:   50.80 ml  30.52 ml/m LA Vol (A4C):   84.5 ml 50.76 ml/m LA Biplane Vol: 73.4 ml 44.10 ml/m  AORTIC VALVE LVOT Vmax:   75.95 cm/s LVOT Vmean:  46.700 cm/s LVOT VTI:    0.125 m  AORTA Ao Asc diam: 3.20 cm MITRAL VALVE               TRICUSPID VALVE MV Area (PHT): 3.12 cm    TR Peak grad:   35.0 mmHg MV Decel Time: 243 msec    TR Vmax:        296.00 cm/s MR Peak grad: 87.0 mmHg MR Mean grad: 62.0 mmHg    SHUNTS MR Vmax:      466.33 cm/s  Systemic VTI: 0.12 m MR Vmean:     367.0 cm/s MV E velocity: 70.60 cm/s MV A velocity: 30.70 cm/s MV E/A ratio:  2.30 Teddie Favre MD Electronically signed by Teddie Favre MD Signature Date/Time: 08/22/2023/11:28:01 AM    Final    CT CEREBRAL PERFUSION W CONTRAST Result Date: 08/21/2023 CLINICAL DATA:  Initial evaluation for acute visual disturbance. EXAM: CT PERFUSION BRAIN TECHNIQUE: Multiphase CT imaging of the brain was performed following IV bolus contrast injection. Subsequent parametric perfusion maps were calculated using RAPID software. RADIATION DOSE REDUCTION: This exam was performed according to the departmental dose-optimization program which includes automated exposure control, adjustment of the mA and/or kV according to patient size and/or use of iterative reconstruction technique. CONTRAST:  32mL OMNIPAQUE  IOHEXOL  350 MG/ML SOLN COMPARISON:  Prior CTs from earlier the same day. FINDINGS: CT Brain Perfusion Findings: CBF (<30%) Volume: 0mL Perfusion (Tmax>6.0s) volume: 0mL Mismatch Volume: 0mL ASPECTS on noncontrast CT Head: 10 at 5:43 p.m. today. Infarct Core: 0 mL Infarction Location:Negative CT perfusion for acute ischemia. Mildly delayed perfusion of T-max greater than 4 seconds in the watershed zones of the left cerebral hemispheres compared to the right, presumably related to the occluded left ICA as seen on prior  CTA. IMPRESSION: 1. Negative CT perfusion for acute ischemia. 2. Mildly delayed perfusion of T-max greater than 4 seconds in the watershed zones of the left cerebral hemisphere compared to the right, presumably related to the occluded left ICA as seen on prior CTA. Electronically Signed   By: Virgia Griffins M.D.   On: 08/21/2023 21:04   CT ANGIO HEAD NECK W WO CM Addendum Date: 08/21/2023 ADDENDUM REPORT: 08/21/2023 19:32 ADDENDUM: CTA neck impression #1 and CTA head impression #1 called by telephone on 08/21/2023 at 7:14 pm to provider Dr. Alecia Ames, who verbally acknowledged these results. Electronically Signed   By: Bascom Lily D.O.   On: 08/21/2023 19:32   Result Date: 08/21/2023 CLINICAL DATA:  Provided history: Neuro deficit, acute, stroke suspected. Left-sided headache, vision changes, dizziness. EXAM: CT ANGIOGRAPHY HEAD AND NECK WITH AND WITHOUT CONTRAST TECHNIQUE: Multidetector CT imaging of the head and neck was performed using the standard protocol during bolus administration of intravenous contrast. Multiplanar CT image reconstructions and MIPs were obtained to evaluate the vascular anatomy. Carotid stenosis measurements (when applicable) are obtained utilizing NASCET criteria, using the distal internal carotid diameter as the denominator. RADIATION DOSE REDUCTION: This exam was performed according to the departmental dose-optimization program which includes automated exposure control, adjustment of the mA and/or kV according to patient size and/or use of iterative reconstruction technique. CONTRAST:  60mL OMNIPAQUE  IOHEXOL  350 MG/ML SOLN COMPARISON:  Non-contrast head CT performed earlier today 08/21/2023. FINDINGS: CTA NECK FINDINGS Aortic arch:  Standard aortic branching. Atherosclerotic plaque within the visualized aortic arch and proximal major branch vessels of the neck. No hemodynamically significant innominate or proximal subclavian artery stenosis. Right carotid system: CCA and ICA  patent within the neck. Atherosclerotic plaque about the carotid bifurcation, within the proximal ICA and within the proximal ECA. Estimated 50% stenosis at the ICA origin. Severe stenosis of the proximal ECA. Left carotid system: The common carotid artery is patent within the neck. The internal carotid artery becomes occluded shortly beyond its origin and remains occluded throughout the remainder of the neck. Calcified atherosclerotic plaque at the ECA origin resulting in at least moderate stenosis. Vertebral arteries: Codominant and patent within the neck. Scattered atherosclerotic plaque within the cervical left vertebral artery. Most notably, calcified plaque within the proximal V3 segment results in up to moderate stenosis. Skeleton: Dextrocurvature of the cervical spine. Cervical spondylosis. No acute fracture or aggressive osseous lesion. Other neck: No neck mass or cervical lymphadenopathy. Upper chest: No consolidation within the imaged lung apices. Review of the MIP images confirms the above findings CTA HEAD FINDINGS Anterior circulation: The intracranial left internal carotid artery remains occluded to the paraclinoid level. Reconstitution of enhancement within the supraclinoid left ICA. The intracranial right internal carotid artery is patent. Calcified atherosclerotic plaque within this vessel with no more than mild stenosis. The M1 middle cerebral arteries are patent. Atherosclerotic irregularity of the M2 and more distal MCA branches. No M2 proximal branch occlusion or high-grade proximal stenosis. The anterior cerebral arteries are patent. Azygous configuration of the anterior cerebral arteries. Mildly hypoplastic left A1 segment. No intracranial aneurysm is identified. Posterior circulation: The intracranial vertebral arteries are patent. The basilar artery is patent. The posterior cerebral arteries are patent. Atherosclerotic irregularity of both vessels. Most notably, there is a moderate/severe  stenosis within the left PCA P2 segment. A left posterior communicating artery is present. The right posterior communicating artery is diminutive or absent. Venous sinuses: Within the limitations of contrast timing, no convincing thrombus. Anatomic variants: As described grade Review of the MIP images confirms the above findings Attempts are being made to reach the ordering provider at this time. IMPRESSION: CTA neck: 1. 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 This is age-indeterminate. 2. The right common and internal carotid arteries are patent within the neck. Estimated 50% atherosclerotic narrowing at the right ICA origin. 3. Vertebral arteries patent within the neck. Up to moderate atherosclerotic narrowing of the left vertebral artery V3 segment. 4. Aortic Atherosclerosis (ICD10-I70.0). CTA head: 1. 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 This is age-indeterminate. Reconstitution of enhancement within the supraclinoid left ICA. 2. No proximal intracranial large vessel occlusion identified elsewhere. 3. Background intracranial atherosclerotic disease as described. Most notably, there is a moderate/severe stenosis within the left PCA P2 segment. Electronically Signed: By: Bascom Lily D.O. On: 08/21/2023 19:04   CT HEAD CODE STROKE WO CONTRAST Result Date: 08/21/2023 CLINICAL DATA:  Code stroke. Neuro deficit, acute, stroke suspected. Dizziness. Eye pain on the left, headache, nausea. EXAM: CT HEAD WITHOUT CONTRAST TECHNIQUE: Contiguous axial images were obtained from the base of the skull through the vertex without intravenous contrast. RADIATION DOSE REDUCTION: This exam was performed according to the departmental dose-optimization program which includes automated exposure control, adjustment of the mA and/or kV according to patient size and/or use of iterative  reconstruction technique. COMPARISON:  Head CT 08/08/2023. FINDINGS: Brain: Generalized  cerebral atrophy. Prominent perivascular spaces within the right basal ganglia inferiorly. Small chronic infarct again demonstrated within the inferior right cerebellar hemisphere. There is no acute intracranial hemorrhage. No demarcated cortical infarct. No extra-axial fluid collection. No evidence of an intracranial mass. No midline shift. Vascular: No hyperdense vessel. Atherosclerotic calcifications. Skull: No calvarial fracture or aggressive osseous lesion. Sinuses/Orbits: No mass or acute finding within the imaged orbits. No significant paranasal sinus disease at the imaged levels. ASPECTS [facility] Stroke Program Early CT Score) - Ganglionic level infarction (caudate, lentiform nuclei, internal capsule, insula, M1-M3 cortex): 7 - Supraganglionic infarction (M4-M6 cortex): 3 Total score (0-10 with 10 being normal): 10 No evidence of an acute intracranial abnormality. These results were communicated to Dr. Alecia Ames at 5:55 pmon 6/10/2025by text page via the [facility] messaging system. IMPRESSION: 1.  No evidence of an acute intracranial abnormality. 2. 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 3. Cerebral atrophy. Electronically Signed   By: Bascom Lily D.O.   On: 08/21/2023 17:56   CT Cervical Spine Wo Contrast Result Date: 08/08/2023 CLINICAL DATA:  Neck trauma (Age >= 65y) EXAM: CT CERVICAL SPINE WITHOUT CONTRAST TECHNIQUE: Multidetector CT imaging of the cervical spine was performed without intravenous contrast. Multiplanar CT image reconstructions were also generated. RADIATION DOSE REDUCTION: This exam was performed according to the departmental dose-optimization program which includes automated exposure control, adjustment of the mA and/or kV according to patient size and/or use of iterative reconstruction technique. COMPARISON:  None Available. FINDINGS: Alignment:  Straightening of normal lordosis. No traumatic subluxation. Skull base and vertebrae: No acute fracture. Vertebral body heights are maintained. Schmorl's node within inferior C7. The dens and skull base are intact. Soft tissues and spinal canal: No prevertebral fluid or swelling. No visible canal hematoma. Disc levels: Disc space narrowing and anterior spurring C4-C5, C5-C6, C6-C7 and C7-T1. Upper chest: No acute findings. Biapical pleuroparenchymal scarring. Other: Carotid calcifications. IMPRESSION: 1. No acute fracture or subluxation of the cervical spine. 2. Multilevel degenerative disc disease. Electronically Signed   By: Chadwick Colonel M.D.   On: 08/08/2023 17:33   DG Elbow 2 Views Left Result Date: 08/08/2023 CLINICAL DATA:  Pain after fall. EXAM: LEFT ELBOW - 2 VIEW COMPARISON:  None Available. FINDINGS: There is no evidence of fracture or dislocation. Right elevated anterior fat pad but no posterior fat pad elevation. There is no evidence of arthropathy or other focal bone abnormality. Soft tissues are unremarkable. IMPRESSION: 1. Possible small joint effusion. 2. No evidence of fracture or dislocation. If symptoms persist, recommend follow-up radiographs in 7-10 days. Electronically Signed   By: Chadwick Colonel M.D.   On: 08/08/2023 17:26   CT Head Wo Contrast Result Date: 08/08/2023 CLINICAL DATA:  Head trauma, minor (Age >= 65y) EXAM: CT HEAD WITHOUT CONTRAST TECHNIQUE: Contiguous axial images were obtained from the base of the skull through the vertex without intravenous contrast. RADIATION DOSE REDUCTION: This exam was performed according to the departmental dose-optimization program which includes automated exposure control, adjustment of the mA and/or kV according to patient size and/or use of iterative reconstruction technique. COMPARISON:  None Available. FINDINGS: Brain: No intracranial hemorrhage, mass effect, or midline shift. Generalized atrophy. No hydrocephalus. The basilar cisterns  are patent. Perivascular space versus remote lacunar infarct in the right basal ganglia. Remote right cerebellar lacunar infarct. No evidence of territorial infarct or acute ischemia. No extra-axial or intracranial fluid collection. Vascular: Atherosclerosis of skullbase vasculature without hyperdense vessel or abnormal calcification. Skull: No fracture or  focal lesion. Sinuses/Orbits: Paranasal sinuses and mastoid air cells are clear. The visualized orbits are unremarkable. Other: No confluent scalp hematoma. IMPRESSION: 1. No acute intracranial abnormality. No skull fracture. 2. Generalized atrophy. Remote right cerebellar lacunar infarct. Electronically Signed   By: Chadwick Colonel M.D.   On: 08/08/2023 17:24   DG Chest 2 View Result Date: 07/31/2023 CLINICAL DATA:  Shortness of breath. EXAM: CHEST - 2 VIEW COMPARISON:  02/02/2016. FINDINGS: Bilateral lung fields are clear. Bilateral costophrenic angles are clear. Normal cardio-mediastinal silhouette. There is a left sided 3-lead pacemaker. No acute osseous abnormalities. The soft tissues are within normal limits. IMPRESSION: *No active cardiopulmonary disease. Electronically Signed   By: [name] M.D.   On: 07/31/2023 14:19     PHYSICAL EXAM  Temp:  [97.5 F (36.4 C)-98 F (36.7 C)] 97.5 F (36.4 C) (06/13 0829) Pulse Rate:  [90] 90 (06/13 0829) Resp:  [12-18] 17 (06/13 0829) BP: (123-149)/(59-81) 147/73 (06/13 0829) SpO2:  [95 %-96 %] 96 % (06/13 0000)  General - Well nourished, well developed, in no apparent distress.  Ophthalmologic - fundi not visualized due to noncooperation.  Cardiovascular - Regular rhythm and rate.  Mental Status -  Level of arousal and orientation to time, place, and person were intact. Language including expression, naming, repetition, comprehension was assessed and found intact. Fund of Knowledge was assessed and was intact.  Cranial Nerves II - XII - II - Visual field intact OU. III, IV, VI -  Extraocular movements intact. V - Facial sensation intact bilaterally. VII - Facial movement intact bilaterally. VIII - Hearing & vestibular intact bilaterally. X - Palate elevates symmetrically. XI - Chin turning & shoulder shrug intact bilaterally. XII - Tongue protrusion intact.  Motor Strength - The patient's strength was normal in all extremities and pronator drift was absent.  Bulk was normal and fasciculations were absent.   Motor Tone - Muscle tone was assessed at the neck and appendages and was normal.  Reflexes - The patient's reflexes were symmetrical in all extremities and he had no pathological reflexes.  Sensory - Light touch, temperature/pinprick were assessed and were symmetrical.    Coordination - The patient had normal movements in the hands and feet with no ataxia or dysmetria.  Tremor was absent.  Gait and Station - deferred.   ASSESSMENT/PLAN Mr. Wetzel Meester. is a 78 y.o. male with history of CHF, PAF on Xarelto , RA, AICD, CKD 3, hypertension, CAD, recent fall admitted for headache, dizziness, nausea vomiting, transient left eye vision loss. No TNK given due to symptom resolved.    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  6/10 acute onset pain radiating from lower jaw to the frontal head on the left with left painless vision loss.  The pain gradually eased off in 2 to 3 hours, left vision loss resumed within 3 to 4 hours. CT no acute abnormality, old right cerebellar infarct CT head and neck left ICA occlusion, reconstituted at siphon.  Right ICA 50% stenosis, left P2 moderate to severe stenosis CT perfusion negative MRI no acute infarct Pending cerebral angiogram on Monday  with intention to treat, discussed with Dr. Ethridge Herder 2D  Echo EF 35 to 40% LDL 54 HgbA1c 5.6 UDS negative Xarelto  15 for VTE prophylaxis Xarelto  (rivaroxaban ) daily prior to admission, now on heparin  IV in preparation for cerebral angiogram. Per Dr. Alvira Josephs, pt will need ASA and brilinta prior to Monday procedure Ongoing aggressive stroke risk factor management Therapy recommendations: Outpatient PT Disposition: Pending  PAF on [facility] Home Xarelto  15 Compliant with medication Currently on heparin  IV in preparation for cerebral angiogram  CHF 01/2023 EF 30 to 35% 07/31/2023 admitted for acute on chronic heart failure This admission EF 35 to 40% On Lasix  40 daily  Hypertension Stable Avoid low BP given left ICA occlusion Long term BP goal normotensive  Hyperlipidemia Home meds: Crestor  5 LDL 54, goal < 70 Now on Crestor  5 No high intensity statin given LDL at goal Continue statin at discharge  Other Stroke Risk Factors Advanced age Coronary artery disease  Other Active Problems AICD placement Rheumatoid arthritis CKD 3, creatinine 1.84--2.12--1.83 Recent diarrhea  Hospital day # 0  I had long discussion with pt and wife at bedside, updated pt current condition, treatment plan and potential prognosis, and answered all the questions. They expressed understanding and appreciation.    Consuelo Denmark, MD PhD Stroke Neurology 08/24/2023 3:09 PM    To contact Stroke Continuity provider, please refer to [email or web address]. After hours, contact General Neurology

## 2023-08-24 NOTE — Care Management Obs Status (Addendum)
 MEDICARE OBSERVATION STATUS NOTIFICATION   Patient Details  Name: Brent Ray. MRN: [id number] Date of Birth: 07-16-1945   Medicare Observation Status Notification Given:  Yes Patient signed on line via my chart   Ryle Buscemi 08/24/2023, 8:55 AM

## 2023-08-24 NOTE — Progress Notes (Signed)
  IR BRIEF PROGRESS NOTE:  Patient is tentatively scheduled for cerebral angiogram with possible intervention on Monday 6/16. Patient has been seen and consented for procedure. Per Dr. Alvira Josephs, patient will need to be initiated on Aspirin  81 every day and Brilinta 90 mg BID in anticipation of procedure on Monday. Patient is currently on Heparin  drip. Care team informed.    Electronically Signed: Lovena Rubinstein, PA-C 08/24/2023, 11:54 AM

## 2023-08-24 NOTE — Progress Notes (Signed)
 Physical Therapy Treatment Patient Details Name: Brent Ray. MRN: [id number] DOB: 1945/04/08 Today's Date: 08/24/2023   History of Present Illness Patient is 78 y.o. male who presented to [facility] 08/21/23 with dizziness, left visual scotoma.  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 Transferred to [facility] for MRI (due to PPM). 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    PT Comments  Patient resting in the recliner and agreeable to mobilize with therapy. Pt unable to recall BEFAST at start of session and reviewed prior to mobilizing. Pt continually stating I know when something is wrong. Pt completed transfers and gait with no AD and CGA fading to Supervision. VSS throughout gait, no overt LOB noted and pt amb ~300'. During gait pt unable to state BEFAST despite recent review and again required cues and re-education for stroke symptoms. On return to room Stroke education booklet reviewed with emphasis on signs/symptoms and on lifestyle changes and controllable risk factors. EOS pt returned to recliner and booklet, call bell, and all needs within reach. Will continue to progress as able.    If plan is discharge home, recommend the following: A little help with walking and/or transfers;A little help with bathing/dressing/bathroom;Help with stairs or ramp for entrance;Assist for transportation   Can travel by private vehicle        Equipment Recommendations  None recommended by PT    Recommendations for Other Services       Precautions / Restrictions Precautions Precautions: Fall Recall of Precautions/Restrictions: Intact Precaution/Restrictions Comments: fall ~2 weeks ago- fell after MD appt with fasting blood draw and delayed meal after, sugar dropped to 36 Restrictions Weight Bearing  Restrictions Per Provider Order: No     Mobility  Bed Mobility               General bed mobility comments: pt OOB in recliner    Transfers Overall transfer level: Needs assistance Equipment used: None Transfers: Sit to/from Stand Sit to Stand: Supervision           General transfer comment: sup for safety    Ambulation/Gait Ambulation/Gait assistance: Supervision Gait Distance (Feet): 300 Feet Assistive device: None Gait Pattern/deviations: Step-through pattern, Decreased stride length, Decreased dorsiflexion - left Gait velocity: decr     General Gait Details: overall steady, no LOB noted, Lt LE with slight steppage, hx of injury to leg.   Stairs             Wheelchair Mobility     Tilt Bed    Modified Rankin (Stroke Patients Only)       Balance                                 Standardized Balance Assessment Standardized Balance Assessment : Dynamic Gait Index   Dynamic Gait Index Level Surface: Normal Change in Gait Speed: Mild Impairment Gait with Horizontal Head Turns: Mild Impairment Gait with Vertical Head Turns: Mild Impairment Gait and Pivot Turn: Mild Impairment Step Over Obstacle: Normal Step Around Obstacles: Mild Impairment Steps: Mild Impairment Total Score: 18      Communication Communication Communication: No apparent difficulties  Cognition Arousal: Alert Behavior During Therapy: WFL for tasks assessed/performed   PT - Cognitive impairments: No apparent impairments  Following commands: Intact      Cueing Cueing Techniques: Verbal cues  Exercises      General Comments        Pertinent Vitals/Pain Pain Assessment Pain Assessment: Faces Faces Pain Scale: Hurts little more Pain Location: headache Pain Descriptors / Indicators: Headache Pain Intervention(s): Limited activity within patient's tolerance, Monitored during session, Premedicated before session,  Repositioned    Home Living                          Prior Function            PT Goals (current goals can now be found in the care plan section) Acute Rehab PT Goals Patient Stated Goal: get home back to routine PT Goal Formulation: With patient Time For Goal Achievement: 09/06/23 Potential to Achieve Goals: Good Progress towards PT goals: Progressing toward goals    Frequency    Min 2X/week      PT Plan      Co-evaluation              AM-PAC PT 6 Clicks Mobility   Outcome Measure  Help needed turning from your back to your side while in a flat bed without using bedrails?: None Help needed moving from lying on your back to sitting on the side of a flat bed without using bedrails?: None Help needed moving to and from a bed to a chair (including a wheelchair)?: A Little Help needed standing up from a chair using your arms (e.g., wheelchair or bedside chair)?: A Little Help needed to walk in hospital room?: A Little Help needed climbing 3-5 steps with a railing? : A Little 6 Click Score: 20    End of Session Equipment Utilized During Treatment: Gait belt Activity Tolerance: Patient tolerated treatment well Patient left: with call bell/phone within reach;in chair;with chair alarm set;with family/visitor present Nurse Communication: Mobility status PT Visit Diagnosis: Other abnormalities of gait and mobility (R26.89);Unsteadiness on feet (R26.81);History of falling (Z91.81);Other symptoms and signs involving the nervous system (R29.898);Difficulty in walking, not elsewhere classified (R26.2)     Time: 1610-9604 PT Time Calculation (min) (ACUTE ONLY): 24 min  Charges:    $Gait Training: 8-22 mins $Self Care/Home Management: 8-22 PT General Charges $$ ACUTE PT VISIT: 1 Visit                     Tish Forge, DPT Acute Rehabilitation Services Office [phone]  08/24/23 1:13 PM

## 2023-08-24 NOTE — Progress Notes (Signed)
 PHARMACY - ANTICOAGULATION CONSULT NOTE  Pharmacy Consult for Heparin  Indication: stroke (hx afib and DVT - chronic Eliquis)  Allergies  Allergen Reactions   Amiodarone Other (See Comments)    Pt reports vision changes and walking changes    Flecainide  Other (See Comments)    Severe headaches, blurred vision   Carvedilol  Other (See Comments)    Unknown    Dapagliflozin Diarrhea   Eplerenone Other (See Comments)    Unknown    Flomax [Tamsulosin Hcl] Hives, Itching and Rash   Lisinopril  Nausea And Vomiting   Metoprolol  Other (See Comments)    Headaches, dizziness   Sacubitril-Valsartan Nausea And Vomiting   Spironolactone  Swelling and Other (See Comments)    Breast swelling, headaches   Statins Other (See Comments)    Leg cramps   Sulfasalazine Nausea Only    Patient Measurements: Height: 5' 8 (172.7 cm) Weight: 56 kg (123 lb 6.4 oz) IBW/kg (Calculated) : 68.4 HEPARIN  DW (KG): 56  Vital Signs: Temp: 97.6 F (36.4 C) (06/13 1945) Temp Source: Oral (06/13 1945) BP: 137/66 (06/13 1945) Pulse Rate: 72 (06/13 1658)  Labs: Recent Labs    08/23/23 0736 08/24/23 0647 08/24/23 1837  HGB  --  12.5*  --   HCT  --  37.5*  --   PLT  --  222  --   APTT  --  54* 59*  HEPARINUNFRC  --  0.67 0.44  CREATININE 1.83*  --   --     Estimated Creatinine Clearance: 27.6 mL/min (A) (by C-G formula based on SCr of 1.83 mg/dL (H)).   Medical History: Past Medical History:  Diagnosis Date   A-fib [facility])    AICD (automatic cardioverter/defibrillator) present    Barrett's esophagus 2011   In [place] . Records in Epic   CHF (congestive heart failure) (HCC)    Chronic kidney disease    Coronary atherosclerosis of native coronary artery    Nonobstructive   Essential hypertension, benign    GERD (gastroesophageal reflux disease)    Heart block AV first degree    Chronic   History of kidney stones    Hypercholesterolemia    Mixed hyperlipidemia    NSVT (nonsustained ventricular  tachycardia) (HCC)    Holter monitor, 2011; recommended RF ablation by Dr. Nunzio Belch - never required   PAF (paroxysmal atrial fibrillation) [facility])    Recently documented in [facility] June 2016   Pre-diabetes    Rheumatoid arthritis (HCC)    Sinus bradycardia    Symptomatic, acebutolol discontinued   Skin cancer    Sleep apnea    pt denies    Medications:  Scheduled:    stroke: early stages of recovery book   Does not apply Once   aspirin  EC  81 mg Oral Daily   fluorescein   1 strip Both Eyes Once   furosemide   40 mg Oral Daily   leptospermum manuka honey  1 Application Topical Daily   mexiletine  150 mg Oral BID   pantoprazole   40 mg Oral QPM   [START ON 08/26/2023] rosuvastatin   5 mg Oral Q Sun   tetracaine   1 drop Both Eyes Once   ticagrelor  90 mg Oral BID   PRN: acetaminophen  **OR** acetaminophen  (TYLENOL ) oral liquid 160 mg/5 mL **OR** acetaminophen , hydrALAZINE , senna-docusate  Assessment: 78 yo male on chronic Eliquis for hx afib and DVT now with 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 Pharmacy consulted to transition to IV heparin , in preparation for cerebral  angiogram on Monday 6/16.   -Last Xarelto  dose 6/11 @1530  -aPTT 59 sec is below goal, heparin  level 0.44 remains impacted by recent DOAC -No infusion issues/interruptions per RN.    Goal of Therapy:  Heparin  level 0.3-0.5 units/ml aPTT 66-85 seconds Monitor platelets by anticoagulation protocol: Yes   Plan:  Increase heparin  IV to 950 units/hr  8h aPTT Daily CBC, heparin  level, aPTT (until correlating with HL's) Continue to monitor H&H and platelets  Cecillia Cogan, PharmD Clinical Pharmacist 08/24/2023  8:16 PM

## 2023-08-24 NOTE — Progress Notes (Signed)
 PROGRESS NOTE        PATIENT DETAILS Name: Brent Ray. Age: 78 y.o. Sex: male Date of Birth: 1946-03-04 Admit Date: 08/21/2023 Admitting Physician Pati Bonine, MD JWJ:XBJYN, Boone Buzzard, MD  Brief Summary: Patient is a 78 y.o.  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  He was evaluated at [facility]-found to have left ICA occlusion on CTA imaging-felt to a MRI (has PPM/CRT-D) and transferred to [facility] for further evaluation.   Significant events: 6/10>> admit to [facility] at [facility] 6/11>> transferred to [facility]  Significant studies: 6/10>> CT head: No acute intracranial abnormality. 6/10>> CTA head/neck: Left ICA occluded shortly beyond its origin- to the left paraclinoid level, 50% narrowing of right ICA origin.  Moderate to severe stenosis within the left PCA P2 segment. 6/10>> CT cerebral perfusion study: Negative for acute ischemia. 6/10>> A1c: 5.6 6/10>> UDS: Negative 6/11>> LDL: 54 6/11>> TTE: EF 35-40,+VE regional wall motion, moderate to severe mitral regurgitation  Significant microbiology data: None  Procedures: None  Consults: Neurology IR  Subjective: Patient was seen and examined at bedside during morning rounds.  Patient was sitting comfortably on the recliner.  No significant overnight events. Patient had headache 4/10 in the morning, which resolved after Tylenol . Left eye visual field cut has resolved.  Headache has resolved.  Dizziness/lightheadedness has resolved.  Objective: Vitals: Blood pressure (!) 147/73, pulse 90, temperature (!) 97.5 F (36.4 C), temperature source Oral, resp. rate 17, height 5' 8 (1.727 m), weight 56 kg, SpO2 96%.   Exam: Gen Exam:Alert awake-not in any distress HEENT:atraumatic,  normocephalic Chest: B/L clear to auscultation anteriorly CVS:S1S2 regular Abdomen:soft non tender, non distended Extremities:no edema Neurology: Non focal Skin: no rash  Pertinent Labs/Radiology:    Latest Ref Rng & Units 08/24/2023    6:47 AM 08/21/2023    5:22 PM 08/21/2023   12:35 PM  CBC  WBC 4.0 - 10.5 K/uL 4.8  7.0  6.0   Hemoglobin 13.0 - 17.0 g/dL 82.9  56.2  13.0   Hematocrit 39.0 - 52.0 % 37.5  36.5  34.0   Platelets 150 - 400 K/uL 222  264  237     Lab Results  Component Value Date   NA 139 08/23/2023   K 3.7 08/23/2023   CL 111 08/23/2023   CO2 17 (L) 08/23/2023      Assessment/Plan: Left visual field cut/dizziness/headache Concern for small stroke not seen on CT imaging-probably thromboembolic given left ICA occlusion Thankfully left visual scotoma/dizziness and headache have all resolved MRI +ve for acute CVA-Dr Christiane Cowing has d/w Neuro int radiology-recommenations are to proceed with a inpatient cerebral angiogram to fully evaluate ICA-as patient had a acute cva while on anticoagulation. Will start Heparin  per pharmacy protocol-24 hours after last xarelto  dose.  Held Xarelto  for now, currently on Hep gtt 6/13 patient is scheduled for cerebral angiogram by IR on Monday, IR recommended to start aspirin  81 mg p.o. daily and Brilinta 90 mg p.o. twice daily.  Continue heparin  IV infusion.  Chronic HFrEF Euvolemic Continue furosemide  Per prior notes-GDMT limited due to intolerance to multiple medications including beta-blocker/Aldactone /digoxin/SGL 2 inhibitors.  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 Held Xarelto  for now, currently on Hep gtt  HTN BP stable-allowing permissive hypertension due to concern for CVA Losartan  remains on hold  HLD Statin  CKD stage IIIb Creatinine close to baseline  Rheumatoid arthritis On methotrexate  at home currently on hold  Code status:   Code Status: Full Code   DVT Prophylaxis:Heparin  gtt    Family Communication: Spouse was at bedside   Disposition Plan: Status is: Observation The patient will require care spanning > 2 midnights and should be moved to inpatient because: Severity of illness   Planned Discharge Destination:Home   Diet: Diet Order             Diet Heart Fluid consistency: Thin  Diet effective now                     Antimicrobial agents: Anti-infectives (From admission, onward)    None        MEDICATIONS: Scheduled Meds:   stroke: early stages of recovery book   Does not apply Once   aspirin  EC  81 mg Oral Daily   fluorescein   1 strip Both Eyes Once   furosemide   40 mg Oral Daily   leptospermum manuka honey  1 Application Topical Daily   mexiletine  150 mg Oral BID   pantoprazole   40 mg Oral QPM   [START ON 08/26/2023] rosuvastatin   5 mg Oral Q Sun   tetracaine   1 drop Both Eyes Once   ticagrelor  90 mg Oral BID   Continuous Infusions:  heparin  800 Units/hr (08/24/23 1046)   PRN Meds:.acetaminophen  **OR** acetaminophen  (TYLENOL ) oral liquid 160 mg/5 mL **OR** acetaminophen , hydrALAZINE , senna-docusate   I have personally reviewed following labs and imaging studies  LABORATORY DATA: CBC: Recent Labs  Lab 08/21/23 1235 08/21/23 1722 08/24/23 0647  WBC 6.0 7.0 4.8  NEUTROABS  --  5.7  --   HGB 11.7* 12.4* 12.5*  HCT 34.0* 36.5* 37.5*  MCV 99.4 101.1* 98.4  PLT 237 264 222    Basic Metabolic Panel: Recent Labs  Lab 08/21/23 1235 08/21/23 1722  08/23/23 0736  NA 140 140 139  K 3.9 3.4* 3.7  CL 114* 114* 111  CO2 19* 15* 17*  GLUCOSE 87 128* 88  BUN 28* 32* 21  CREATININE 1.84* 2.12* 1.83*  CALCIUM  8.8* 8.8* 8.6*  MG  --   --  1.9    GFR: Estimated Creatinine Clearance: 27.6 mL/min (A) (by C-G formula based on SCr of 1.83 mg/dL (H)).  Liver Function Tests: Recent Labs  Lab 08/21/23 1722  AST 37  ALT 33  ALKPHOS 57  BILITOT 1.3*  PROT 6.4*  ALBUMIN  3.5   No results for input(s): LIPASE, AMYLASE in the last 168 hours. No results for input(s): AMMONIA in the last 168 hours.  Coagulation Profile: No results for input(s): INR, PROTIME in the last 168 hours.  Cardiac Enzymes: No results for input(s): CKTOTAL, CKMB, CKMBINDEX, TROPONINI in the last 168 hours.  BNP (last 3 results) No results for input(s): PROBNP in the last 8760 hours.  Lipid Profile: Recent Labs  08/22/23 0451  CHOL 125  HDL 62  LDLCALC 54  TRIG 43  CHOLHDL 2.0    Thyroid  Function Tests: No results for input(s): TSH, T4TOTAL, FREET4, T3FREE, THYROIDAB in the last 72 hours.  Anemia Panel: No results for input(s): VITAMINB12, FOLATE, FERRITIN, TIBC, IRON, RETICCTPCT in the last 72 hours.  Urine analysis: No results found for: COLORURINE, APPEARANCEUR, LABSPEC, PHURINE, GLUCOSEU, HGBUR, BILIRUBINUR, KETONESUR, PROTEINUR, UROBILINOGEN, NITRITE, LEUKOCYTESUR  Sepsis Labs: Lactic Acid, Venous No results found for: LATICACIDVEN  MICROBIOLOGY: No results found for this or any previous visit (from the past 240 hours).  RADIOLOGY STUDIES/RESULTS: MR BRAIN WO CONTRAST Result Date: 08/23/2023 CLINICAL DATA:  Provided history: Vision changes. Headache. Dizziness. EXAM: MRI HEAD WITHOUT CONTRAST TECHNIQUE: Multiplanar, multiecho pulse sequences of the brain and surrounding structures were obtained without intravenous contrast. COMPARISON:  Non-contrast head CT, CT angiogram  head/neck and CT perfusion head 08/21/2023. FINDINGS: Brain: Mild generalized cerebral atrophy. 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 Multifocal T2 FLAIR hyperintense signal abnormality within the cerebral white matter, nonspecific but compatible with moderate chronic small vessel ischemic disease. Mild chronic small vessel ischemic changes within the pons. Small T2 hyperintense focus within the right caudate head likely reflecting a prominent perivascular space. Prominent perivascular space also present within the right basal ganglia inferiorly. 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 Nonspecific punctate chronic microhemorrhage within medial the right cerebellar hemisphere. No evidence of an intracranial mass. No extra-axial fluid collection. No midline shift. 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 Skull and upper cervical spine: No focal worrisome marrow lesion. Sinuses/Orbits: No mass or acute finding within the imaged orbits. Prior but ocular lens replacement. No significant paranasal sinus disease. IMPRESSION: 1. 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 2. Known occlusion of the left internal carotid artery. 3. Chronic small vessel ischemic changes which are moderate in the cerebral white matter and mild in the pons. 4. Small chronic infarct within the right cerebellar hemisphere. 5. Mild generalized cerebral atrophy. Electronically Signed   By: Bascom Lily D.O.   On: 08/23/2023 13:22     LOS: 0 days   Althia Atlas, MD  [facility]    To contact the attending provider between 7A-7P or the covering provider during after hours 7P-7A, please log into the web site [email or web address] and access using universal [place] password for that web site. If you do not  have the password, please call the hospital operator.  08/24/2023, 3:05 PM

## 2023-08-24 NOTE — Progress Notes (Signed)
 PHARMACY - ANTICOAGULATION CONSULT NOTE  Pharmacy Consult for Heparin  Indication: stroke (hx afib and DVT - chronic Eliquis)  Allergies  Allergen Reactions   Amiodarone Other (See Comments)    Pt reports vision changes and walking changes    Flecainide  Other (See Comments)    Severe headaches, blurred vision   Carvedilol  Other (See Comments)    Unknown    Dapagliflozin Diarrhea   Eplerenone Other (See Comments)    Unknown    Flomax [Tamsulosin Hcl] Hives, Itching and Rash   Lisinopril  Nausea And Vomiting   Metoprolol  Other (See Comments)    Headaches, dizziness   Sacubitril-Valsartan Nausea And Vomiting   Spironolactone  Swelling and Other (See Comments)    Breast swelling, headaches   Statins Other (See Comments)    Leg cramps   Sulfasalazine Nausea Only    Patient Measurements: Height: 5' 8 (172.7 cm) Weight: 56 kg (123 lb 6.4 oz) IBW/kg (Calculated) : 68.4 HEPARIN  DW (KG): 56  Vital Signs: Temp: 97.5 F (36.4 C) (06/13 0829) Temp Source: Oral (06/13 0829) BP: 147/73 (06/13 0829) Pulse Rate: 90 (06/13 0829)  Labs: Recent Labs    08/21/23 1235 08/21/23 1722 08/23/23 0736 08/24/23 0647  HGB 11.7* 12.4*  --  12.5*  HCT 34.0* 36.5*  --  37.5*  PLT 237 264  --  222  APTT  --   --   --  54*  HEPARINUNFRC  --   --   --  0.67  CREATININE 1.84* 2.12* 1.83*  --     Estimated Creatinine Clearance: 27.6 mL/min (A) (by C-G formula based on SCr of 1.83 mg/dL (H)).   Medical History: Past Medical History:  Diagnosis Date   A-fib [facility])    AICD (automatic cardioverter/defibrillator) present    Barrett's esophagus 2011   In [place] . Records in Epic   CHF (congestive heart failure) (HCC)    Chronic kidney disease    Coronary atherosclerosis of native coronary artery    Nonobstructive   Essential hypertension, benign    GERD (gastroesophageal reflux disease)    Heart block AV first degree    Chronic   History of kidney stones    Hypercholesterolemia    Mixed  hyperlipidemia    NSVT (nonsustained ventricular tachycardia) (HCC)    Holter monitor, 2011; recommended RF ablation by Dr. Nunzio Belch - never required   PAF (paroxysmal atrial fibrillation) [facility])    Recently documented in [facility] June 2016   Pre-diabetes    Rheumatoid arthritis (HCC)    Sinus bradycardia    Symptomatic, acebutolol discontinued   Skin cancer    Sleep apnea    pt denies    Medications:  Scheduled:    stroke: early stages of recovery book   Does not apply Once   fluorescein   1 strip Both Eyes Once   furosemide   40 mg Oral Daily   mexiletine  150 mg Oral BID   pantoprazole   40 mg Oral QPM   [START ON 08/26/2023] rosuvastatin   5 mg Oral Q Sun   tetracaine   1 drop Both Eyes Once   PRN: acetaminophen  **OR** acetaminophen  (TYLENOL ) oral liquid 160 mg/5 mL **OR** acetaminophen , hydrALAZINE , senna-docusate  Assessment: 78 yo male on chronic Eliquis for hx afib and DVT now with 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 Pharmacy consulted to transition to IV heparin , in preparation for cerebral angiogram on Monday 6/16.   6/13:  aPTT 54 seconds,  HL 0.67.   HL likely effected  by previous Xarelto  , last given 6/11 ~15:30, thus using aPTT to monitor heparin .    Hgb 12.5 stable, pltc 222k stable.   No bleeding reported.   Goal of Therapy:  Heparin  level 0.3-0.5 units/ml aPTT 66-85 seconds Monitor platelets by anticoagulation protocol: Yes   Plan:  No heparin  bolus Increase Heparin  to 800 units/hr  Check aPTT and HL in 8hrs and daily until correlate, then HL only Continue to monitor H&H and platelets   Alisa Irish, RPh Clinical Pharmacist 08/24/2023,9:17 AM  Please check AMION for all [facility] Pharmacy phone numbers After 10:00 PM, call Main Pharmacy [phone]

## 2023-08-24 NOTE — Consult Note (Addendum)
 WOC Nurse Consult Note: Reason for Consult: Requested to assess a skin tear for a fall. Wound type: Full thickness on left elbow and left knee. Pressure Injury POA: NA Left elbow Measurement: 6 x 2 x 0.1 cm Wound bed: 100% yellow Drainage (amount, consistency, odor) Scant amount, serous, no odor. Periwound: Partial cover by scabs, intact Left knee Measurement: 2 x 2 x 0.1 cm Wound bed: 80% yellow, 20% red Drainage (amount, consistency, odor) Scant amount, serous, no odor. Periwound: Partial cover by scabs, intact Dressing procedure/placement/frequency: Left elbow and left knee - Clean with Vashe #[id number], Apply Medihoney daily on the wound bed, cover with foam dressing, change every 3 days or PRN.  WOC team will not plan to follow further.  Please reconsult if further assistance is needed. Thank-you,  Rachel Budds BSN, RN, [employer], WOC  (Pager: [phone])

## 2023-08-25 ENCOUNTER — Inpatient Hospital Stay (HOSPITAL_COMMUNITY)

## 2023-08-25 DIAGNOSIS — I63512 Cerebral infarction due to unspecified occlusion or stenosis of left middle cerebral artery: Secondary | ICD-10-CM | POA: Diagnosis not present

## 2023-08-25 DIAGNOSIS — I480 Paroxysmal atrial fibrillation: Secondary | ICD-10-CM | POA: Diagnosis not present

## 2023-08-25 DIAGNOSIS — I7771 Dissection of carotid artery: Secondary | ICD-10-CM | POA: Diagnosis not present

## 2023-08-25 DIAGNOSIS — R29700 NIHSS score 0: Secondary | ICD-10-CM | POA: Diagnosis not present

## 2023-08-25 DIAGNOSIS — R42 Dizziness and giddiness: Secondary | ICD-10-CM | POA: Diagnosis not present

## 2023-08-25 DIAGNOSIS — Z950 Presence of cardiac pacemaker: Secondary | ICD-10-CM

## 2023-08-25 LAB — CBC
HCT: 40.7 % (ref 39.0–52.0)
Hemoglobin: 13.8 g/dL (ref 13.0–17.0)
MCH: 33.5 pg (ref 26.0–34.0)
MCHC: 33.9 g/dL (ref 30.0–36.0)
MCV: 98.8 fL (ref 80.0–100.0)
Platelets: 197 10*3/uL (ref 150–400)
RBC: 4.12 MIL/uL — ABNORMAL LOW (ref 4.22–5.81)
RDW: 15 % (ref 11.5–15.5)
WBC: 8.5 10*3/uL (ref 4.0–10.5)
nRBC: 0 % (ref 0.0–0.2)

## 2023-08-25 LAB — BASIC METABOLIC PANEL WITH GFR
Anion gap: 12 (ref 5–15)
BUN: 23 mg/dL (ref 8–23)
CO2: 19 mmol/L — ABNORMAL LOW (ref 22–32)
Calcium: 9 mg/dL (ref 8.9–10.3)
Chloride: 107 mmol/L (ref 98–111)
Creatinine, Ser: 2.24 mg/dL — ABNORMAL HIGH (ref 0.61–1.24)
GFR, Estimated: 30 mL/min — ABNORMAL LOW (ref >60)
Glucose, Bld: 96 mg/dL (ref 70–99)
Potassium: 3.3 mmol/L — ABNORMAL LOW (ref 3.5–5.1)
Sodium: 138 mmol/L (ref 135–145)

## 2023-08-25 LAB — HEPARIN LEVEL (UNFRACTIONATED)
Heparin Unfractionated: 0.84 [IU]/mL — ABNORMAL HIGH (ref 0.30–0.70)
Heparin Unfractionated: 0.55 [IU]/mL (ref 0.30–0.70)

## 2023-08-25 LAB — MAGNESIUM: Magnesium: 1.9 mg/dL (ref 1.7–2.4)

## 2023-08-25 LAB — GLUCOSE, CAPILLARY: Glucose-Capillary: 82 mg/dL (ref 70–99)

## 2023-08-25 LAB — APTT: aPTT: 110 s — ABNORMAL HIGH (ref 24–36)

## 2023-08-25 LAB — PHOSPHORUS: Phosphorus: 3.3 mg/dL (ref 2.5–4.6)

## 2023-08-25 MED ORDER — POTASSIUM CHLORIDE CRYS ER 20 MEQ PO TBCR
40.0000 meq | EXTENDED_RELEASE_TABLET | Freq: Once | ORAL | Status: AC
  Administered 2023-08-25: 40 meq via ORAL

## 2023-08-25 MED ORDER — POTASSIUM CHLORIDE CRYS ER 20 MEQ PO TBCR: 40.0000 meq | EXTENDED_RELEASE_TABLET | Freq: Once | ORAL | Status: DC

## 2023-08-25 MED ORDER — SODIUM CHLORIDE 0.9% FLUSH
3.0000 mL | Freq: Two times a day (BID) | INTRAVENOUS | Status: DC
  Administered 2023-08-25 – 2023-08-26 (×3): 3 mL via INTRAVENOUS
  Administered 2023-08-28 (×2): 10 mL via INTRAVENOUS

## 2023-08-25 MED ORDER — POTASSIUM CHLORIDE CRYS ER 20 MEQ PO TBCR
40.0000 meq | EXTENDED_RELEASE_TABLET | Freq: Every day | ORAL | Status: DC
  Filled 2023-08-25: qty 2

## 2023-08-25 MED ORDER — SODIUM CHLORIDE 0.9% FLUSH: 3.0000 mL | INTRAVENOUS | Status: DC | PRN

## 2023-08-25 MED ORDER — IOHEXOL 350 MG/ML SOLN
100.0000 mL | Freq: Once | INTRAVENOUS | Status: AC | PRN
  Administered 2023-08-25: 100 mL via INTRAVENOUS

## 2023-08-25 MED ORDER — HEPARIN (PORCINE) 25000 UT/250ML-% IV SOLN
750.0000 [IU]/h | INTRAVENOUS | Status: DC
  Administered 2023-08-25: 850 [IU]/h via INTRAVENOUS
  Administered 2023-08-26: 800 [IU]/h via INTRAVENOUS
  Filled 2023-08-25: qty 250

## 2023-08-25 NOTE — Progress Notes (Signed)
@  1930- pt alert and oriented with no any discomfort .  @ 50- RN found the pt having slurred speech and difficulty moving right leg. Made charge Anselmo Bast and MD [facility] aware.Called the rapid response. Activated code stoke. Patient taken for CT Head. Recommend for neuro check q2 hrs with HOB less than 30 degree and continue the Heparin  gtt.  Will continue the plan of care for now

## 2023-08-25 NOTE — Progress Notes (Addendum)
 NEUROLOGY CONSULT FOLLOW UP NOTE   Date of service: August 25, 2023 Patient Name: Brent Ray. MRN:  [id number] DOB:  12-03-1945  Interval Hx/subjective   Recalled for Aphasia, right sided weakness - LKW 1930 hrs On heparin . 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 On schedule for angio with NIR Monday. Last check 1930 was normal. Afterwards noted to be symptomatic. I was called by RRT RN and requested code stroke activation. Seen in CT.  Initially aphasia and right-sided weakness noted on exam but after the completion of CT and CTA, back to baseline.  Last known well 7:30 PM today TNK-no-on heparin  drip Endovascular thrombectomy-no, symptoms resolved, distal occlusion. NIH stroke scale initially 6, then back to 0.   Vitals   Vitals:   08/25/23 1215 08/25/23 1704 08/25/23 1752 08/25/23 2013  BP: (!) 142/79 (!) 153/85  (!) 152/90  Pulse:  73 79   Resp: 14 14 13  (!) 22  Temp: (!) 97.2 F (36.2 C) (!) 97.4 F (36.3 C) 97.6 F (36.4 C) (!) 97.1 F (36.2 C)  TempSrc: Oral Oral Oral Oral  SpO2:   100%   Weight:      Height:         Body mass index is 18.76 kg/m.  Physical Exam   General: Awake alert in no distress HEENT: Normocephalic atraumatic Cardiovascular: Regular rhythm Respiratory: Breathing well saturating normally on room air Neurological Awake alert has word salad, able to follow simple commands Cranial nerves: Pupils equal round react light, extract movements intact, visual fields full, no facial asymmetry, tongue and palate midline. Motor examination with drift in the right upper and lower extremity.  Left side full strength Sensation intact to light touch Coordination examination with no gross dysmetria NIH stroke scale 1a Level of Conscious.: 0 1b LOC Questions: 2 1c LOC Commands: 0 2 Best Gaze: 0 3 Visual: 0 4 Facial Palsy: 0 5a Motor Arm - left: 0 5b Motor Arm - Right: 1 6a Motor Leg - Left:  0 6b Motor Leg - Right: 1 7 Limb Ataxia: 0 8 Sensory: 0 9 Best Language: 1 10 Dysarthria: 1 11 Extinct. and Inatten.: 0 TOTAL: 6  After the CT, CTA, NIH 0.   Medications  Current Facility-Administered Medications:     stroke: early stages of recovery book, , Does not apply, Once, Emokpae, Ejiroghene E, MD   acetaminophen  (TYLENOL ) tablet 650 mg, 650 mg, Oral, Q4H PRN, 650 mg at 08/24/23 1129 **OR** acetaminophen  (TYLENOL ) 160 MG/5ML solution 650 mg, 650 mg, Per Tube, Q4H PRN **OR** acetaminophen  (TYLENOL ) suppository 650 mg, 650 mg, Rectal, Q4H PRN, Emokpae, Ejiroghene E, MD   aspirin  EC tablet 81 mg, 81 mg, Oral, Daily, Althia Atlas, MD, 81 mg at 08/25/23 0900   fluorescein  ophthalmic strip 1 strip, 1 strip, Both Eyes, Once, Paytes, Austin A, RPH   heparin  ADULT infusion 100 units/mL (25000 units/250mL), 800 Units/hr, Intravenous, Continuous, Carney, Skipper Dumas, RPH, Last Rate: 8 mL/hr at 08/25/23 2002, 800 Units/hr at 08/25/23 2002   hydrALAZINE  (APRESOLINE ) injection 10 mg, 10 mg, Intravenous, Q4H PRN, Emokpae, Ejiroghene E, MD   leptospermum manuka honey (MEDIHONEY) paste 1 Application, 1 Application, Topical, Daily, Althia Atlas, MD, 1 Application at 08/25/23 0901   mexiletine (MEXITIL ) capsule 150 mg, 150 mg, Oral, BID, Emokpae, Ejiroghene E, MD, 150 mg at 08/25/23 0900   pantoprazole  (PROTONIX ) EC tablet 40 mg, 40 mg, Oral, QPM, Emokpae, Ejiroghene E, MD, 40 mg at 08/25/23 1701   [  START ON 08/26/2023] rosuvastatin  (CRESTOR ) tablet 5 mg, 5 mg, Oral, Q Sun, Emokpae, Ejiroghene E, MD   senna-docusate (Senokot-S) tablet 1 tablet, 1 tablet, Oral, QHS PRN, Emokpae, Ejiroghene E, MD   sodium chloride  flush (NS) 0.9 % injection 3-10 mL, 3-10 mL, Intravenous, Q12H, Jakaree Pickard, MD   sodium chloride  flush (NS) 0.9 % injection 3-10 mL, 3-10 mL, Intravenous, PRN, Jevaeh Shams, MD   tetracaine  (PONTOCAINE) 0.5 % ophthalmic solution 1 drop, 1 drop, Both Eyes, Once, Ninetta Basket, MD    ticagrelor (BRILINTA) tablet 90 mg, 90 mg, Oral, BID, Althia Atlas, MD, 90 mg at 08/25/23 0900  Labs and Diagnostic Imaging   CBC:  Recent Labs  Lab 08/21/23 1722 08/24/23 0647 08/25/23 1608  WBC 7.0 4.8 8.5  NEUTROABS 5.7  --   --   HGB 12.4* 12.5* 13.8  HCT 36.5* 37.5* 40.7  MCV 101.1* 98.4 98.8  PLT 264 222 197    Basic Metabolic Panel:  Lab Results  Component Value Date   NA 138 08/25/2023   K 3.3 (L) 08/25/2023   CO2 19 (L) 08/25/2023   GLUCOSE 96 08/25/2023   BUN 23 08/25/2023   CREATININE 2.24 (H) 08/25/2023   CALCIUM  9.0 08/25/2023   GFRNONAA 30 (L) 08/25/2023   GFRAA >60 04/05/2016   Lipid Panel:  Lab Results  Component Value Date   LDLCALC 54 08/22/2023   HgbA1c:  Lab Results  Component Value Date   HGBA1C 5.6 08/21/2023   Urine Drug Screen:     Component Value Date/Time   LABOPIA NONE DETECTED 08/21/2023 1956   COCAINSCRNUR NONE DETECTED 08/21/2023 1956   LABBENZ NONE DETECTED 08/21/2023 1956   AMPHETMU NONE DETECTED 08/21/2023 1956   THCU NONE DETECTED 08/21/2023 1956   LABBARB NONE DETECTED 08/21/2023 1956    Alcohol  Level     Component Value Date/Time   ETH <15 08/21/2023 1722   INR  Lab Results  Component Value Date   INR 1.1 08/01/2023   APTT  Lab Results  Component Value Date   APTT 110 (H) 08/25/2023   Imaging personally reviewed CT Head without contrast(Personally reviewed): Aspects 10.  No acute findings  CT angio Head and Neck with contrast, CT perfusion study (Personally reviewed): Unchanged short segment severe stenosis versus dissection in the cavernous left ICA.  Possible left M3 slow flow versus occlusion which is new from before.  Perfusion deficit of 20 cc with no cord distal left MCA relatable to the slow flow on the left M3.  Assessment   Brent Ray. is a 78 y.o. 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  Currently on a heparin  drip pending diagnostic cerebral angiogram electively on Monday. Last known well 7:30 PM today and had sudden onset of aphasia and right-sided weakness. Initial NIH stroke scale 6 on examination.  Taken for stat CT head and CTA head and neck-CT head with no acute findings, no bleed.  CT angio with similar findings in the neck in the cavernous ICA but there is a concern for a left M3 distal segment occlusion with mild to 20 cc perfusion deficit in the left MCA territory localizable to the same left M3 segment. He  is not a candidate for emergent intervention due to the distal location of the perfusion deficit. After the CT and CT angio, his exam improved to an NIH stroke scale of 0.  That also precludes him from emergent intervention at this time. He has a pacemaker and cannot get an emergent MRI but I would recommend repeating an MRI at some point when possible.  Impression: Possible small left MCA territory infarct due to distal left M3 occlusion with known left ICA dissection.  Recommendations  Frequent neurochecks-every 2 hours for now. Stat CT head if any new or worsening. Continue heparin  drip for now. Repeat MRI when possible-likely will be Monday because of his AICD and the hospital protocols If he continues to have fluctuating symptoms, may need to get a diagnostic angiogram sooner than Monday. Patient will need aspirin  and Brilinta prior to the procedure on Monday otherwise as planned by Dr. Alvira Josephs. Rest of the plan as in Dr. Mollie Anger consult note from 08/24/2023. Stroke team will follow with you on Sunday ______________________________________________________________________  Plan relayed to Dr. Augustus Ledger by secure chat   Signed, Tona Francis, MD [facility]  CRITICAL CARE ATTESTATION Performed by: Tona Francis, MD Total critical care time: 50 minutes Critical care time was exclusive of separately billable procedures and treating other patients and/or supervising APPs/Residents/Students Critical care was necessary to treat or prevent imminent or life-threatening deterioration. This patient is critically ill and at significant risk for neurological worsening and/or death and care requires constant monitoring. Critical care was time spent personally by me on the following activities: development of treatment plan with patient and/or surrogate as well as nursing, discussions with consultants, evaluation of patient's response to treatment, examination of patient, obtaining history from patient or surrogate, ordering and performing treatments and interventions, ordering and review of laboratory studies, ordering and review of radiographic studies, pulse oximetry, re-evaluation of patient's condition, participation in multidisciplinary rounds and medical decision making of high complexity in the care of this patient.

## 2023-08-25 NOTE — Progress Notes (Signed)
 PROGRESS NOTE        PATIENT DETAILS Name: Brent Ray. Age: 78 y.o. Sex: male Date of Birth: 1945/10/09 Admit Date: 08/21/2023 Admitting Physician Pati Bonine, MD ZOX:[id number], Boone Buzzard, MD  Brief Summary: Patient is a 78 y.o.  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  He was evaluated at [facility]-found to have left ICA occlusion on CTA imaging-felt to a MRI (has PPM/CRT-D) and transferred to [facility] for further evaluation.   Significant events: 6/10>> admit to [facility] at [facility] 6/11>> transferred to [facility]  Significant studies: 6/10>> CT head: No acute intracranial abnormality. 6/10>> CTA head/neck: Left ICA occluded shortly beyond its origin- to the left paraclinoid level, 50% narrowing of right ICA origin.  Moderate to severe stenosis within the left PCA P2 segment. 6/10>> CT cerebral perfusion study: Negative for acute ischemia. 6/10>> A1c: 5.6 6/10>> UDS: Negative 6/11>> LDL: 54 6/11>> TTE: EF 35-40,+VE regional wall motion, moderate to severe mitral regurgitation  Significant microbiology data: None  Procedures: None  Consults: Neurology IR  Subjective:  Patient in bed, appears comfortable, denies any headache, no fever, no chest pain or pressure, no shortness of breath , no abdominal pain. No new focal weakness.   Objective: Vitals: Blood pressure 137/71, pulse 72, temperature (!) 97.3 F (36.3 C), temperature source Oral, resp. rate 17, height 5' 8 (1.727 m), weight 56 kg, SpO2 97%.   Exam:  Awake Alert, No new F.N deficits, Normal affect Armour.AT,PERRAL Supple Neck, No JVD,   Symmetrical Chest wall movement, Good air movement bilaterally, CTAB RRR,No Gallops, Rubs or new Murmurs,  +ve B.Sounds, Abd Soft, No  tenderness,   No Cyanosis, Clubbing or edema    Assessment/Plan:  Left visual field cut/dizziness/headache Concern for small stroke not seen on CT imaging-probably thromboembolic given left ICA occlusion Thankfully left visual scotoma/dizziness and headache have all resolved MRI +ve for acute CVA-Dr Christiane Cowing has d/w Neuro int radiology-recommenations are to proceed with a inpatient cerebral angiogram to fully evaluate ICA-as patient had a acute cva while on anticoagulation. Will start Heparin  per pharmacy protocol-24 hours after last xarelto  dose.  Held Xarelto  for now, currently on Hep gtt with aspirin  and Brilinta He is scheduled for cerebral angiogram by IR on Monday, IR recommended to start aspirin  81 mg p.o. daily and Brilinta 90 mg p.o. twice daily.  Continue heparin  IV infusion.  Chronic HFrEF Euvolemic Continue furosemide , changed to as needed due to upcoming IV dye load Per prior notes-GDMT limited due to intolerance to multiple medications including beta-blocker/Aldactone /digoxin/SGL 2 inhibitors.  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  Rheumatoid arthritis On methotrexate  at home currently on hold  Code status:   Code Status: Full Code   DVT  Prophylaxis:Heparin  gtt    Family Communication: Spouse was at bedside   Disposition Plan: Status is: Observation The patient will require care spanning > 2 midnights and should be moved to inpatient because: Severity of illness   Planned Discharge Destination:Home   Diet: Diet Order             Diet Heart Fluid consistency: Thin  Diet effective now                     Antimicrobial agents: Anti-infectives (From admission, onward)    None        Data Review:   Inpatient Medications  Scheduled Meds:   stroke: early stages of recovery book   Does not apply Once   aspirin  EC  81 mg Oral Daily   fluorescein   1 strip Both Eyes Once   furosemide   40 mg Oral Daily   leptospermum manuka honey  1 Application Topical Daily   mexiletine  150 mg Oral BID   pantoprazole   40 mg Oral QPM   potassium chloride   40 mEq Oral Daily   [START ON 08/26/2023] rosuvastatin   5 mg Oral Q Sun   tetracaine   1 drop Both Eyes Once   ticagrelor  90 mg Oral BID   Continuous Infusions:  heparin  950 Units/hr (08/24/23 2353)   PRN Meds:.acetaminophen  **OR** acetaminophen  (TYLENOL ) oral liquid 160 mg/5 mL **OR** acetaminophen , hydrALAZINE , senna-docusate  DVT Prophylaxis  Hep gtt   Recent Labs  Lab 08/21/23 1235 08/21/23 1722 08/24/23 0647  WBC 6.0 7.0 4.8  HGB 11.7* 12.4* 12.5*  HCT 34.0* 36.5* 37.5*  PLT 237 264 222  MCV 99.4 101.1* 98.4  MCH 34.2* 34.3* 32.8  MCHC 34.4 34.0 33.3  RDW 14.8 14.9 14.7  LYMPHSABS  --  0.9  --   MONOABS  --  0.2  --   EOSABS  --  0.1  --   BASOSABS  --  0.1  --     Recent Labs  Lab 08/21/23 1235 08/21/23 1722 08/23/23 0736 08/25/23 0644  NA 140 140 139 138  K 3.9 3.4* 3.7 3.3*  CL 114* 114* 111 107  CO2 19* 15* 17* 19*  ANIONGAP 7 11 11 12   GLUCOSE 87 128* 88 96  BUN 28* 32* 21 23  CREATININE 1.84* 2.12* 1.83* 2.24*  AST  --  37  --   --   ALT  --  33  --   --   ALKPHOS  --  57  --   --   BILITOT  --  1.3*  --   --   ALBUMIN    --  3.5  --   --   HGBA1C  --  5.6  --   --   MG  --   --  1.9 1.9  PHOS  --   --   --  3.3  CALCIUM  8.8* 8.8* 8.6* 9.0      Recent Labs  Lab 08/21/23 1235 08/21/23 1722 08/23/23 0736 08/25/23 0644  HGBA1C  --  5.6  --   --   MG  --   --  1.9 1.9  CALCIUM  8.8* 8.8* 8.6* 9.0    --------------------------------------------------------------------------------------------------------------- Lab Results  Component Value Date   CHOL 125 08/22/2023   HDL 62 08/22/2023   LDLCALC 54 08/22/2023  TRIG 43 08/22/2023   CHOLHDL 2.0 08/22/2023    Lab Results  Component Value Date   HGBA1C 5.6 08/21/2023   No results for input(s): TSH, T4TOTAL, FREET4, T3FREE, THYROIDAB in the last 72 hours. No results for input(s): VITAMINB12, FOLATE, FERRITIN, TIBC, IRON, RETICCTPCT in the last 72 hours. ------------------------------------------------------------------------------------------------------------------ Cardiac Enzymes No results for input(s): CKMB, TROPONINI, MYOGLOBIN in the last 168 hours.  Invalid input(s): CK  Micro Results No results found for this or any previous visit (from the past 240 hours).  Radiology Reports  MR BRAIN WO CONTRAST Result Date: 08/23/2023 CLINICAL DATA:  Provided history: Vision changes. Headache. Dizziness. EXAM: MRI HEAD WITHOUT CONTRAST TECHNIQUE: Multiplanar, multiecho pulse sequences of the brain and surrounding structures were obtained without intravenous contrast. COMPARISON:  Non-contrast head CT, CT angiogram head/neck and CT perfusion head 08/21/2023. FINDINGS: Brain: Mild generalized cerebral atrophy. 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 Multifocal T2 FLAIR hyperintense signal abnormality within the cerebral white matter, nonspecific but compatible with moderate chronic small vessel ischemic disease. Mild chronic small vessel ischemic changes within  the pons. Small T2 hyperintense focus within the right caudate head likely reflecting a prominent perivascular space. Prominent perivascular space also present within the right basal ganglia inferiorly. 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 Nonspecific punctate chronic microhemorrhage within medial the right cerebellar hemisphere. No evidence of an intracranial mass. No extra-axial fluid collection. No midline shift. 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 Skull and upper cervical spine: No focal worrisome marrow lesion. Sinuses/Orbits: No mass or acute finding within the imaged orbits. Prior but ocular lens replacement. No significant paranasal sinus disease. IMPRESSION: 1. 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 2. Known occlusion of the left internal carotid artery. 3. Chronic small vessel ischemic changes which are moderate in the cerebral white matter and mild in the pons. 4. Small chronic infarct within the right cerebellar hemisphere. 5. Mild generalized cerebral atrophy. Electronically Signed   By: Bascom Lily D.O.   On: 08/23/2023 13:22      Signature  -   Lynnwood Sauer M.D on 08/25/2023 at 8:54 AM   -  To page go to [email or web address]

## 2023-08-25 NOTE — Progress Notes (Signed)
 PHARMACY - ANTICOAGULATION CONSULT NOTE  Pharmacy Consult for Heparin  Indication: stroke (hx afib and DVT - chronic Eliquis)  Allergies  Allergen Reactions   Amiodarone Other (See Comments)    Pt reports vision changes and walking changes    Flecainide  Other (See Comments)    Severe headaches, blurred vision   Carvedilol  Other (See Comments)    Unknown    Dapagliflozin Diarrhea   Eplerenone Other (See Comments)    Unknown    Flomax [Tamsulosin Hcl] Hives, Itching and Rash   Lisinopril  Nausea And Vomiting   Metoprolol  Other (See Comments)    Headaches, dizziness   Sacubitril-Valsartan Nausea And Vomiting   Spironolactone  Swelling and Other (See Comments)    Breast swelling, headaches   Statins Other (See Comments)    Leg cramps   Sulfasalazine Nausea Only    Patient Measurements: Height: 5' 8 (172.7 cm) Weight: 56 kg (123 lb 6.4 oz) IBW/kg (Calculated) : 68.4 HEPARIN  DW (KG): 56  Vital Signs: Temp: 97.3 F (36.3 C) (06/14 0752) Temp Source: Oral (06/14 0752) BP: 137/71 (06/14 0752)  Labs: Recent Labs    08/23/23 0736 08/24/23 0647 08/24/23 1837 08/25/23 0644  HGB  --  12.5*  --   --   HCT  --  37.5*  --   --   PLT  --  222  --   --   APTT  --  54* 59* 110*  HEPARINUNFRC  --  0.67 0.44 0.84*  CREATININE 1.83*  --   --  2.24*    Estimated Creatinine Clearance: 22.6 mL/min (A) (by C-G formula based on SCr of 2.24 mg/dL (H)).   Medical History: Past Medical History:  Diagnosis Date   A-fib [facility])    AICD (automatic cardioverter/defibrillator) present    Barrett's esophagus 2011   In [place] . Records in Epic   CHF (congestive heart failure) (HCC)    Chronic kidney disease    Coronary atherosclerosis of native coronary artery    Nonobstructive   Essential hypertension, benign    GERD (gastroesophageal reflux disease)    Heart block AV first degree    Chronic   History of kidney stones    Hypercholesterolemia    Mixed hyperlipidemia    NSVT  (nonsustained ventricular tachycardia) (HCC)    Holter monitor, 2011; recommended RF ablation by Dr. Nunzio Belch - never required   PAF (paroxysmal atrial fibrillation) [facility])    Recently documented in [facility] June 2016   Pre-diabetes    Rheumatoid arthritis (HCC)    Sinus bradycardia    Symptomatic, acebutolol discontinued   Skin cancer    Sleep apnea    pt denies    Medications:  Scheduled:    stroke: early stages of recovery book   Does not apply Once   aspirin  EC  81 mg Oral Daily   fluorescein   1 strip Both Eyes Once   leptospermum manuka honey  1 Application Topical Daily   mexiletine  150 mg Oral BID   pantoprazole   40 mg Oral QPM   [START ON 08/26/2023] rosuvastatin   5 mg Oral Q Sun   tetracaine   1 drop Both Eyes Once   ticagrelor  90 mg Oral BID   PRN: acetaminophen  **OR** acetaminophen  (TYLENOL ) oral liquid 160 mg/5 mL **OR** acetaminophen , hydrALAZINE , senna-docusate  Assessment: 78 yo male on chronic Eliquis for hx afib and DVT now with 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 Pharmacy consulted to transition to IV heparin , in preparation for  cerebral angiogram on Monday 6/16.   -Last Xarelto  dose 6/11 @1530  -aPTT 110 and heparin  level 0.84 are both supratherapeutic and correlating -Confirmed level was drawn from opposite arm of heparin  infusion. -No infusion issues/interruptions or s/sx of bleeding per RN.   -CBC in process  Goal of Therapy:  Heparin  level 0.3-0.5 units/ml aPTT 66-85 seconds Monitor platelets by anticoagulation protocol: Yes   Plan:  Hold heparin  infusion for 30 minutes, then decrease heparin  to 850 units/hr  F/u 8h HL Daily CBC & heparin  level Continue to monitor for s/sx of bleeding  Abelina Abide, PharmD PGY1 Pharmacy Resident 08/25/2023 9:31 AM

## 2023-08-25 NOTE — Code Documentation (Addendum)
 Stroke Response Nurse Documentation Code Documentation  Brent Ray. is a 78 y.o. male admitted to [facility]  on 6/10 for dizzyness, headache with past medical hx of CHF, afib, CKD, HTN, RA. On heparin  IV. Code stroke was activated by nursing staff .   Patient on 5W unit where he was LKW at 1930 and now complaining of slurred speech,right sided weakness and aphasia.  Stroke team at the bedside after patient activation. Patient to CT with team. NIHSS 6, see documentation for details and code stroke times. Patient with disoriented, right arm weakness, right leg weakness, and Expressive aphasia and dysarthria on exam. The following imaging was completed:  CT Head, CTA, and CTP. Patient is not a candidate for IV Thrombolytic due to Heparin . Patient is not a candidate for IR due to distal occlusion and symptoms resolved.  Care/Plan: Neuro checks q2 hrs, keep HOB less than 30 degrees.    Bedside handoff with RN Brent Ray.    Brent Ray  Rapid Response RN

## 2023-08-25 NOTE — Progress Notes (Signed)
 PHARMACY - ANTICOAGULATION CONSULT NOTE  Pharmacy Consult for Heparin  Indication: stroke (hx afib and DVT - chronic Eliquis)  Allergies  Allergen Reactions   Amiodarone Other (See Comments)    Pt reports vision changes and walking changes    Flecainide  Other (See Comments)    Severe headaches, blurred vision   Carvedilol  Other (See Comments)    Unknown    Dapagliflozin Diarrhea   Eplerenone Other (See Comments)    Unknown    Flomax [Tamsulosin Hcl] Hives, Itching and Rash   Lisinopril  Nausea And Vomiting   Metoprolol  Other (See Comments)    Headaches, dizziness   Sacubitril-Valsartan Nausea And Vomiting   Spironolactone  Swelling and Other (See Comments)    Breast swelling, headaches   Statins Other (See Comments)    Leg cramps   Sulfasalazine Nausea Only    Patient Measurements: Height: 5' 8 (172.7 cm) Weight: 56 kg (123 lb 6.4 oz) IBW/kg (Calculated) : 68.4 HEPARIN  DW (KG): 56  Vital Signs: Temp: 97.6 F (36.4 C) (06/14 1752) Temp Source: Oral (06/14 1752) BP: 153/85 (06/14 1704) Pulse Rate: 79 (06/14 1752)  Labs: Recent Labs    08/23/23 0736 08/24/23 0647 08/24/23 0647 08/24/23 1837 08/25/23 0644 08/25/23 1608 08/25/23 1929  HGB  --  12.5*  --   --   --  13.8  --   HCT  --  37.5*  --   --   --  40.7  --   PLT  --  222  --   --   --  197  --   APTT  --  54*  --  59* 110*  --   --   HEPARINUNFRC  --  0.67   < > 0.44 0.84*  --  0.55  CREATININE 1.83*  --   --   --  2.24*  --   --    < > = values in this interval not displayed.    Estimated Creatinine Clearance: 22.6 mL/min (A) (by C-G formula based on SCr of 2.24 mg/dL (H)).   Medical History: Past Medical History:  Diagnosis Date   A-fib [facility])    AICD (automatic cardioverter/defibrillator) present    Barrett's esophagus 2011   In [place] . Records in Epic   CHF (congestive heart failure) (HCC)    Chronic kidney disease    Coronary atherosclerosis of native coronary artery    Nonobstructive    Essential hypertension, benign    GERD (gastroesophageal reflux disease)    Heart block AV first degree    Chronic   History of kidney stones    Hypercholesterolemia    Mixed hyperlipidemia    NSVT (nonsustained ventricular tachycardia) (HCC)    Holter monitor, 2011; recommended RF ablation by Dr. Nunzio Belch - never required   PAF (paroxysmal atrial fibrillation) [facility])    Recently documented in [facility] June 2016   Pre-diabetes    Rheumatoid arthritis (HCC)    Sinus bradycardia    Symptomatic, acebutolol discontinued   Skin cancer    Sleep apnea    pt denies    Medications:  Scheduled:    stroke: early stages of recovery book   Does not apply Once   aspirin  EC  81 mg Oral Daily   fluorescein   1 strip Both Eyes Once   leptospermum manuka honey  1 Application Topical Daily   mexiletine  150 mg Oral BID   pantoprazole   40 mg Oral QPM   [START ON 08/26/2023] rosuvastatin   5 mg Oral Q  Sun   tetracaine   1 drop Both Eyes Once   ticagrelor  90 mg Oral BID   PRN: acetaminophen  **OR** acetaminophen  (TYLENOL ) oral liquid 160 mg/5 mL **OR** acetaminophen , hydrALAZINE , senna-docusate  Assessment: 78 yo male on chronic Eliquis for hx afib and DVT now with 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 Pharmacy consulted to transition to IV heparin , in preparation for cerebral angiogram on Monday 6/16.   -Last Xarelto  dose 6/11 @1530   -Heparin  level this evening is slightly above reduced goal range at 0.55.  CBC stable, no overt bleeding or complications noted.  Goal of Therapy:  Heparin  level 0.3-0.5 units/ml aPTT 66-85 seconds Monitor platelets by anticoagulation protocol: Yes   Plan:  Reduce heparin  gtt to 800 units/hr. Continue daily heparin  level and CBC.  Joanell Mowers, Davey Erp, BCCP Clinical Pharmacist  08/25/2023 7:59 PM   [facility] pharmacy phone numbers are listed on [email or web address] c

## 2023-08-25 NOTE — Plan of Care (Signed)
   Problem: Clinical Measurements: Goal: Diagnostic test results will improve Outcome: Progressing Goal: Respiratory complications will improve Outcome: Progressing   Problem: Activity: Goal: Risk for activity intolerance will decrease Outcome: Progressing   Problem: Nutrition: Goal: Adequate nutrition will be maintained Outcome: Progressing   Problem: Safety: Goal: Ability to remain free from injury will improve Outcome: Progressing

## 2023-08-26 ENCOUNTER — Inpatient Hospital Stay (HOSPITAL_COMMUNITY)

## 2023-08-26 DIAGNOSIS — R42 Dizziness and giddiness: Secondary | ICD-10-CM | POA: Diagnosis not present

## 2023-08-26 LAB — BASIC METABOLIC PANEL WITH GFR
Anion gap: 12 (ref 5–15)
BUN: 24 mg/dL — ABNORMAL HIGH (ref 8–23)
CO2: 17 mmol/L — ABNORMAL LOW (ref 22–32)
Calcium: 8.8 mg/dL — ABNORMAL LOW (ref 8.9–10.3)
Chloride: 106 mmol/L (ref 98–111)
Creatinine, Ser: 2 mg/dL — ABNORMAL HIGH (ref 0.61–1.24)
GFR, Estimated: 34 mL/min — ABNORMAL LOW (ref >60)
Glucose, Bld: 91 mg/dL (ref 70–99)
Potassium: 3.7 mmol/L (ref 3.5–5.1)
Sodium: 135 mmol/L (ref 135–145)

## 2023-08-26 LAB — CBC
HCT: 36.6 % — ABNORMAL LOW (ref 39.0–52.0)
Hemoglobin: 12.2 g/dL — ABNORMAL LOW (ref 13.0–17.0)
MCH: 33.3 pg (ref 26.0–34.0)
MCHC: 33.3 g/dL (ref 30.0–36.0)
MCV: 100 fL (ref 80.0–100.0)
Platelets: 245 10*3/uL (ref 150–400)
RBC: 3.66 MIL/uL — ABNORMAL LOW (ref 4.22–5.81)
RDW: 15.4 % (ref 11.5–15.5)
WBC: 8.1 10*3/uL (ref 4.0–10.5)
nRBC: 0 % (ref 0.0–0.2)

## 2023-08-26 LAB — PHOSPHORUS: Phosphorus: 2.6 mg/dL (ref 2.5–4.6)

## 2023-08-26 LAB — MAGNESIUM: Magnesium: 1.8 mg/dL (ref 1.7–2.4)

## 2023-08-26 LAB — HEPARIN LEVEL (UNFRACTIONATED): Heparin Unfractionated: 0.58 [IU]/mL (ref 0.30–0.70)

## 2023-08-26 NOTE — Plan of Care (Signed)
  Problem: Health Behavior/Discharge Planning: Goal: Ability to manage health-related needs will improve Outcome: Progressing   Problem: Clinical Measurements: Goal: Will remain free from infection Outcome: Progressing   Problem: Safety: Goal: Ability to remain free from injury will improve Outcome: Progressing   Problem: Education: Goal: Knowledge of secondary prevention will improve (MUST DOCUMENT ALL) Outcome: Progressing   Problem: Ischemic Stroke/TIA Tissue Perfusion: Goal: Complications of ischemic stroke/TIA will be minimized Outcome: Progressing

## 2023-08-26 NOTE — Progress Notes (Signed)
 STROKE TEAM PROGRESS NOTE   SUBJECTIVE (INTERVAL HISTORY) His wife is at the bedside.  Patient seen and evaluated this morning. Last night, he did have recrudescence of his symptoms, became aphasic. Stroke code called again. Initial NIH 6, then 0 after head CT. Head and MRI did not show any acute stroke but angiogram showed new M3 occlusion. He remains on heparin  pending angiogram scheduled for tomorrow. He tells that he is feeling better, speech is back to normal, confirmed by spouse.     Pt lying in bed, no acute event overnight, vision intact. On heparin  IV. IR on board, plan for angio Monday with intention to treat if ICA still open.     OBJECTIVE Temp:  [97.1 F (36.2 C)-97.7 F (36.5 C)] 97.7 F (36.5 C) (06/15 1305) Pulse Rate:  [67-79] 69 (06/15 1305) Cardiac Rhythm: Ventricular paced (06/15 0700) Resp:  [11-22] 19 (06/15 1305) BP: (121-153)/(54-90) 121/62 (06/15 1305) SpO2:  [98 %-100 %] 100 % (06/15 1305)  Recent Labs  Lab 08/25/23 2019  GLUCAP 82   Recent Labs  Lab 08/21/23 1235 08/21/23 1722 08/23/23 0736 08/25/23 0644 08/26/23 1134  NA 140 140 139 138 135  K 3.9 3.4* 3.7 3.3* 3.7  CL 114* 114* 111 107 106  CO2 19* 15* 17* 19* 17*  GLUCOSE 87 128* 88 96 91  BUN 28* 32* 21 23 24*  CREATININE 1.84* 2.12* 1.83* 2.24* 2.00*  CALCIUM  8.8* 8.8* 8.6* 9.0 8.8*  MG  --   --  1.9 1.9 1.8  PHOS  --   --   --  3.3 2.6   Recent Labs  Lab 08/21/23 1722  AST 37  ALT 33  ALKPHOS 57  BILITOT 1.3*  PROT 6.4*  ALBUMIN  3.5   Recent Labs  Lab 08/21/23 1235 08/21/23 1722 08/24/23 0647 08/25/23 1608 08/26/23 1134  WBC 6.0 7.0 4.8 8.5 8.1  NEUTROABS  --  5.7  --   --   --   HGB 11.7* 12.4* 12.5* 13.8 12.2*  HCT 34.0* 36.5* 37.5* 40.7 36.6*  MCV 99.4 101.1* 98.4 98.8 100.0  PLT 237 264 222 197 245   No results for input(s): CKTOTAL, CKMB, CKMBINDEX, TROPONINI in the last 168 hours. No results for input(s): LABPROT, INR in the last 72 hours. No  results for input(s): COLORURINE, LABSPEC, PHURINE, GLUCOSEU, HGBUR, BILIRUBINUR, KETONESUR, PROTEINUR, UROBILINOGEN, NITRITE, LEUKOCYTESUR in the last 72 hours.  Invalid input(s): APPERANCEUR     Component Value Date/Time   CHOL 125 08/22/2023 0451   TRIG 43 08/22/2023 0451   HDL 62 08/22/2023 0451   CHOLHDL 2.0 08/22/2023 0451   VLDL 9 08/22/2023 0451   LDLCALC 54 08/22/2023 0451   Lab Results  Component Value Date   HGBA1C 5.6 08/21/2023      Component Value Date/Time   LABOPIA NONE DETECTED 08/21/2023 1956   COCAINSCRNUR NONE DETECTED 08/21/2023 1956   LABBENZ NONE DETECTED 08/21/2023 1956   AMPHETMU NONE DETECTED 08/21/2023 1956   THCU NONE DETECTED 08/21/2023 1956   LABBARB NONE DETECTED 08/21/2023 1956    Recent Labs  Lab 08/21/23 1722  ETH <15    I have personally reviewed the radiological images below and agree with the radiology interpretations.  DG Chest Port 1 View Result Date: 08/26/2023 CLINICAL DATA:  Shortness of breath. EXAM: PORTABLE CHEST 1 VIEW COMPARISON:  07/31/2023 FINDINGS: The lungs are clear without focal pneumonia, edema, pneumothorax or pleural effusion. Cardiopericardial silhouette is at upper limits of normal for size. Left-sided  permanent pacemaker/AICD again noted. Telemetry leads overlie the chest. No acute bony abnormality. IMPRESSION: No active disease. Electronically Signed   By: Donnal Fusi M.D.   On: 08/26/2023 06:43   CT ANGIO HEAD NECK W WO CM W PERF (CODE STROKE) Result Date: 08/25/2023 CLINICAL DATA:  Initial evaluation for acute neuro deficit, stroke suspected, right-sided weakness. Aphasia. EXAM: CT ANGIOGRAPHY HEAD AND NECK CT PERFUSION BRAIN TECHNIQUE: Multidetector CT imaging of the head and neck was performed using the standard protocol during bolus administration of intravenous contrast. Multiplanar CT image reconstructions and MIPs were obtained to evaluate the vascular anatomy. Carotid stenosis  measurements (when applicable) are obtained utilizing NASCET criteria, using the distal internal carotid diameter as the denominator. Multiphase CT imaging of the brain was performed following IV bolus contrast injection. Subsequent parametric perfusion maps were calculated using RAPID software. RADIATION DOSE REDUCTION: This exam was performed according to the departmental dose-optimization program which includes automated exposure control, adjustment of the mA and/or kV according to patient size and/or use of iterative reconstruction technique. CONTRAST:  100mL OMNIPAQUE  IOHEXOL  350 MG/ML SOLN COMPARISON:  CT from earlier the same day as well as prior studies from 08/21/2023. FINDINGS: CTA NECK FINDINGS Aortic arch: Visualized aortic arch within normal limits for caliber standard 3 vessel morphology. Aortic atherosclerosis. No significant stenosis about the origin the great vessels. Right carotid system: Right common and internal carotid arteries are patent without dissection. Moderate calcified plaque about the right carotid bulb without hemodynamically significant greater than 50% stenosis. Minimal irregularity about the mid cervical right ICA noted, favored atherosclerotic in nature. Left carotid system: Left common carotid artery remains patent to the bifurcation without stenosis. Atheromatous change about the left carotid bifurcation without hemodynamically significant stenosis. Previously seen left ICA occlusion is no longer seen, with interval revascularization of the left ICA in the neck. Left ICA remains patent the skull base without significant stenosis or evidence for dissection. Vertebral arteries: Both vertebral arteries arise from subclavian arteries. No proximal subclavian artery stenosis. Evaluation of vertebral artery somewhat limited by adjacent venous contamination. Tubal arteries M cells are patent without stenosis or dissection. Skeleton: No discrete or worrisome osseous lesions. Moderate  cervical spondylosis. Other neck: No other acute finding. Upper chest: Streak artifact from a left-sided pacemaker/AICD. No other acute finding. Review of the MIP images confirms the above findings CTA HEAD FINDINGS Anterior circulation: Atheromatous change about the carotid siphons bilaterally. No significant stenosis on the right. The left ICA is now patent to the terminus. Focal filling defect within the cavernous left ICA at the level of the anterior genu consistent with small volume thrombus (series 5, image 133). This likely reflects a small amount of migrated clot from the neck below given prior left ICA occlusion. A1 segments patent bilaterally. Normal anterior communicating artery complex. Azygous ACA widely patent without stenosis. No M1 stenosis or occlusion. Right MCA branches patent and well perfused. On the left, there is a distal left M3 occlusion, likely embolic (a series 5, image 99). Remainder of the left MCA branches remain patent. Posterior circulation: Both V4 segments patent without stenosis. Both PICA patent at their origins. Basilar patent without stenosis. Superior cerebral arteries patent bilaterally. Atheromatous irregularity about the PCAs with associated mild to moderate bilateral P2 stenoses. PCAs remain patent to their distal aspects. Venous sinuses: Grossly patent allowing for timing the contrast bolus. Anatomic variants: Azygous ACA.  No aneurysm. Review of the MIP images confirms the above findings CT Brain Perfusion Findings: ASPECTS: 10 CBF (<  30%) Volume: 0mL Perfusion (Tmax>6.0s) volume: 20mL Mismatch Volume: 20mL Infarction Location:Negative CT perfusion for acute core infarct. 20 mL area of delayed perfusion at the mid-posterior left frontal lobe, in keeping with the distal left M3 occlusion. No other perfusion abnormality. IMPRESSION: 1. Interval revascularization of the left ICA in the neck, with the left ICA now patent to the terminus. Focal filling defect within the  cavernous left ICA at the level of the anterior genu consistent with small volume thrombus, likely migrated clot from the neck below given prior left ICA occlusion. Secondary severe stenosis at this level. 2. Downstream acute distal left M3 occlusion, likely embolic. 3. Negative CT perfusion for acute core infarct. 20 mL area of delayed perfusion at the mid-posterior left frontal lobe, in keeping with the left M3 occlusion. 4. Atheromatous change about the carotid bifurcations and carotid siphons without hemodynamically significant stenosis. 5.  Aortic Atherosclerosis (ICD10-I70.0). Results were discussed by telephone at the time of interpretation on 08/25/2023 at 9:10 p.m. to provider [facility] , who verbally acknowledged these results. Electronically Signed   By: Virgia Griffins M.D.   On: 08/25/2023 21:55   CT HEAD CODE STROKE WO CONTRAST Result Date: 08/25/2023 CLINICAL DATA:  Code stroke. Initial evaluation for acute neuro deficit, stroke suspected. EXAM: CT HEAD WITHOUT CONTRAST TECHNIQUE: Contiguous axial images were obtained from the base of the skull through the vertex without intravenous contrast. RADIATION DOSE REDUCTION: This exam was performed according to the departmental dose-optimization program which includes automated exposure control, adjustment of the mA and/or kV according to patient size and/or use of iterative reconstruction technique. COMPARISON:  Prior studies from 08/21/2023 FINDINGS: Brain: Mild age-related cerebral atrophy with chronic small vessel ischemic disease. Small remote right cerebellar infarct. No acute intracranial hemorrhage. No acute large vessel territory infarct. No mass lesion or midline shift. No hydrocephalus or extra-axial fluid collection. Vascular: No abnormal hyperdense vessel. Calcified atherosclerosis present at the skull base. Skull: Scalp soft tissues within normal limits.  Calvarium intact. Sinuses/Orbits: Globes orbital soft tissues within normal  limits. Paranasal sinuses are clear. No mastoid effusion. Other: None. ASPECTS [facility] Stroke Program Early CT Score) - Ganglionic level infarction (caudate, lentiform nuclei, internal capsule, insula, M1-M3 cortex): 7 - Supraganglionic infarction (M4-M6 cortex): 3 Total score (0-10 with 10 being normal): 10 IMPRESSION: 1. No acute intracranial abnormality. 2. ASPECTS is 10. 3. 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 These results were communicated to Dr. Arora at 8:52 pm on 08/25/2023 by text page via the [facility] messaging system. Electronically Signed   By: Virgia Griffins M.D.   On: 08/25/2023 20:55   MR BRAIN WO CONTRAST Result Date: 08/23/2023 CLINICAL DATA:  Provided history: Vision changes. Headache. Dizziness. EXAM: MRI HEAD WITHOUT CONTRAST TECHNIQUE: Multiplanar, multiecho pulse sequences of the brain and surrounding structures were obtained without intravenous contrast. COMPARISON:  Non-contrast head CT, CT angiogram head/neck and CT perfusion head 08/21/2023. FINDINGS: Brain: Mild generalized cerebral atrophy. 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 Multifocal T2 FLAIR hyperintense signal abnormality within the cerebral white matter, nonspecific but compatible with moderate chronic small vessel ischemic disease. Mild chronic small vessel ischemic changes within the pons. Small T2 hyperintense focus within the right caudate head likely reflecting a prominent perivascular space. Prominent perivascular space also present within the right basal ganglia inferiorly. 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 Nonspecific punctate chronic microhemorrhage within medial  the right cerebellar hemisphere. No evidence of an intracranial mass. No extra-axial fluid collection. No midline shift. 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 Skull and upper cervical spine: No focal worrisome marrow lesion. Sinuses/Orbits: No mass or acute finding within the imaged orbits. Prior but ocular lens replacement. No significant paranasal sinus disease. IMPRESSION: 1. 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 2. Known occlusion of the left internal carotid artery. 3. Chronic small vessel ischemic changes which are moderate in the cerebral white matter and mild in the pons. 4. Small chronic infarct within the right cerebellar hemisphere. 5. Mild generalized cerebral atrophy. Electronically Signed   By: Bascom Lily D.O.   On: 08/23/2023 13:22   CUP PACEART REMOTE DEVICE CHECK Result Date: 08/22/2023 ICD Scheduled remote reviewed. Normal device function.  Presenting rhythm: BIV paced HF diagnostics abnormal this monitoring period. BIV pacing at 91%; stable. No new events since 4/4 programmer session, prior to that 6 NSVT events, longest x 4 sec; V rates >200 bpm.  Likely ventricular driven. Next remote 91 days. , CVRS  ECHOCARDIOGRAM COMPLETE Result Date: 08/22/2023    ECHOCARDIOGRAM REPORT   Patient Name:   Leonidus Rowand. Date of Exam: 08/22/2023 Medical Rec #:  [id number]            Height:       68.0 in Accession #:    [id number]           Weight:       123.4 lb Date of Birth:  [birth year]            BSA:          1.665 m Patient Age:    77 years             BP:           142/87 mmHg Patient Gender: M                    HR:           83 bpm. Exam Location:  [facility] Procedure: 2D Echo, Cardiac Doppler, Color Doppler and Strain Analysis (Both            Spectral and Color Flow Doppler were utilized during procedure). Indications:    stroke I63.9  History:        Patient has prior history of Echocardiogram examinations, most                 recent 01/29/2023. CHF, CAD; Risk Factors:Hypertension and                 Dyslipidemia.   Sonographer:    Kip Peon RDCS Referring Phys: [id number] Arnie Lao [facility]  Sonographer Comments: Global longitudinal strain was attempted. IMPRESSIONS  1. Left ventricular ejection fraction, by estimation, is 35 to 40%. The left ventricle has moderately decreased function. The left ventricle demonstrates regional wall motion abnormalities (see scoring diagram/findings for description). There is mild concentric left ventricular hypertrophy. Left ventricular diastolic parameters are indeterminate.  2. Right ventricular systolic function is normal. The right ventricular size is normal. There is mildly elevated pulmonary artery systolic pressure. The estimated right ventricular systolic pressure is 38.0 mmHg.  3. Left atrial size was moderately dilated.  4. The mitral valve is degenerative with mild posterior leaflet prolapse. Moderate to severe mitral valve regurgitation.  5.  Tricuspid valve regurgitation is mild to moderate.  6. The aortic valve is tricuspid. There is mild calcification of the aortic valve. Aortic valve regurgitation is trivial. No aortic stenosis is present.  7. The inferior vena cava is normal in size with greater than 50% respiratory variability, suggesting right atrial pressure of 3 mmHg.  8. Evidence of probable atrial level shunting detected by color flow Doppler, left to right at mid interatrial septum. Suggest agitated saline study. Comparison(s): Prior images reviewed side by side. LVEF 35-40% with stable WMA and septal motion consistent with RV pacing. Mild posterior leaflet mitral prolapse with moderate to severe mtiral regurgitation. 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 FINDINGS  Left Ventricle: Left ventricular ejection fraction, by estimation, is 35 to 40%. The left ventricle has moderately decreased function. The left ventricle demonstrates regional wall motion abnormalities. The left ventricular internal cavity size was normal in size. There is mild  concentric left ventricular hypertrophy. Abnormal (paradoxical) septal motion, consistent with RV pacemaker. Left ventricular diastolic parameters are indeterminate.  LV Wall Scoring: The entire inferior wall, posterior wall, and basal anterolateral segment are hypokinetic. The entire anterior wall, entire septum, apical lateral segment, mid anterolateral segment, and apex are normal. Right Ventricle: The right ventricular size is normal. No increase in right ventricular wall thickness. Right ventricular systolic function is normal. There is mildly elevated pulmonary artery systolic pressure. The tricuspid regurgitant velocity is 2.96  m/s, and with an assumed right atrial pressure of 3 mmHg, the estimated right ventricular systolic pressure is 38.0 mmHg. Left Atrium: Left atrial size was moderately dilated. Right Atrium: Right atrial size was normal in size. Pericardium: There is no evidence of pericardial effusion. Mitral Valve: The mitral valve is degenerative in appearance. There is mild thickening of the mitral valve leaflet(s). Moderate to severe mitral valve regurgitation, with posteriorly-directed jet. Tricuspid Valve: The tricuspid valve is grossly normal. Tricuspid valve regurgitation is mild to moderate. Aortic Valve: The aortic valve is tricuspid. There is mild calcification of the aortic valve. There is mild aortic valve annular calcification. Aortic valve regurgitation is trivial. No aortic stenosis is present. Pulmonic Valve: The pulmonic valve was grossly normal. Pulmonic valve regurgitation is trivial. Aorta: The aortic root and ascending aorta are structurally normal, with no evidence of dilitation. Venous: The inferior vena cava is normal in size with greater than 50% respiratory variability, suggesting right atrial pressure of 3 mmHg. IAS/Shunts: Evidence of atrial level shunting detected by color flow Doppler. Additional Comments: 3D was performed not requiring image post processing on an  independent workstation and was indeterminate. A device lead is visualized.  LEFT VENTRICLE PLAX 2D LVIDd:         5.10 cm LVIDs:         4.40 cm LV PW:         1.40 cm 3D Volume EF LV IVS:        1.00 cm LV 3D EDV:   123.82 ml                        LV 3D ESV:   48.23 ml RIGHT VENTRICLE          IVC RV Basal diam:  3.70 cm  IVC diam: 1.60 cm LEFT ATRIUM             Index        RIGHT ATRIUM           Index  LA diam:        4.10 cm 2.46 cm/m   RA Area:     19.90 cm LA Vol (A2C):   58.0 ml 34.84 ml/m  RA Volume:   50.80 ml  30.52 ml/m LA Vol (A4C):   84.5 ml 50.76 ml/m LA Biplane Vol: 73.4 ml 44.10 ml/m  AORTIC VALVE LVOT Vmax:   75.95 cm/s LVOT Vmean:  46.700 cm/s LVOT VTI:    0.125 m  AORTA Ao Asc diam: 3.20 cm MITRAL VALVE               TRICUSPID VALVE MV Area (PHT): 3.12 cm    TR Peak grad:   35.0 mmHg MV Decel Time: 243 msec    TR Vmax:        296.00 cm/s MR Peak grad: 87.0 mmHg MR Mean grad: 62.0 mmHg    SHUNTS MR Vmax:      466.33 cm/s  Systemic VTI: 0.12 m MR Vmean:     367.0 cm/s MV E velocity: 70.60 cm/s MV A velocity: 30.70 cm/s MV E/A ratio:  2.30 Teddie Favre MD Electronically signed by Teddie Favre MD Signature Date/Time: 08/22/2023/11:28:01 AM    Final    CT CEREBRAL PERFUSION W CONTRAST Result Date: 08/21/2023 CLINICAL DATA:  Initial evaluation for acute visual disturbance. EXAM: CT PERFUSION BRAIN TECHNIQUE: Multiphase CT imaging of the brain was performed following IV bolus contrast injection. Subsequent parametric perfusion maps were calculated using RAPID software. RADIATION DOSE REDUCTION: This exam was performed according to the departmental dose-optimization program which includes automated exposure control, adjustment of the mA and/or kV according to patient size and/or use of iterative reconstruction technique. CONTRAST:  32mL OMNIPAQUE  IOHEXOL  350 MG/ML SOLN COMPARISON:  Prior CTs from earlier the same day. FINDINGS: CT Brain Perfusion Findings: CBF (<30%) Volume: 0mL Perfusion  (Tmax>6.0s) volume: 0mL Mismatch Volume: 0mL ASPECTS on noncontrast CT Head: 10 at 5:43 p.m. today. Infarct Core: 0 mL Infarction Location:Negative CT perfusion for acute ischemia. Mildly delayed perfusion of T-max greater than 4 seconds in the watershed zones of the left cerebral hemispheres compared to the right, presumably related to the occluded left ICA as seen on prior CTA. IMPRESSION: 1. Negative CT perfusion for acute ischemia. 2. Mildly delayed perfusion of T-max greater than 4 seconds in the watershed zones of the left cerebral hemisphere compared to the right, presumably related to the occluded left ICA as seen on prior CTA. Electronically Signed   By: Virgia Griffins M.D.   On: 08/21/2023 21:04   CT ANGIO HEAD NECK W WO CM Addendum Date: 08/21/2023 ADDENDUM REPORT: 08/21/2023 19:32 ADDENDUM: CTA neck impression #1 and CTA head impression #1 called by telephone on 08/21/2023 at 7:14 pm to provider Dr. Alecia Ames, who verbally acknowledged these results. Electronically Signed   By: Bascom Lily D.O.   On: 08/21/2023 19:32   Result Date: 08/21/2023 CLINICAL DATA:  Provided history: Neuro deficit, acute, stroke suspected. Left-sided headache, vision changes, dizziness. EXAM: CT ANGIOGRAPHY HEAD AND NECK WITH AND WITHOUT CONTRAST TECHNIQUE: Multidetector CT imaging of the head and neck was performed using the standard protocol during bolus administration of intravenous contrast. Multiplanar CT image reconstructions and MIPs were obtained to evaluate the vascular anatomy. Carotid stenosis measurements (when applicable) are obtained utilizing NASCET criteria, using the distal internal carotid diameter as the denominator. RADIATION DOSE REDUCTION: This exam was performed according to the departmental dose-optimization program which includes automated exposure control, adjustment of the mA and/or kV according to patient  size and/or use of iterative reconstruction technique. CONTRAST:  60mL OMNIPAQUE   IOHEXOL  350 MG/ML SOLN COMPARISON:  Non-contrast head CT performed earlier today 08/21/2023. FINDINGS: CTA NECK FINDINGS Aortic arch: Standard aortic branching. Atherosclerotic plaque within the visualized aortic arch and proximal major branch vessels of the neck. No hemodynamically significant innominate or proximal subclavian artery stenosis. Right carotid system: CCA and ICA patent within the neck. Atherosclerotic plaque about the carotid bifurcation, within the proximal ICA and within the proximal ECA. Estimated 50% stenosis at the ICA origin. Severe stenosis of the proximal ECA. Left carotid system: The common carotid artery is patent within the neck. The internal carotid artery becomes occluded shortly beyond its origin and remains occluded throughout the remainder of the neck. Calcified atherosclerotic plaque at the ECA origin resulting in at least moderate stenosis. Vertebral arteries: Codominant and patent within the neck. Scattered atherosclerotic plaque within the cervical left vertebral artery. Most notably, calcified plaque within the proximal V3 segment results in up to moderate stenosis. Skeleton: Dextrocurvature of the cervical spine. Cervical spondylosis. No acute fracture or aggressive osseous lesion. Other neck: No neck mass or cervical lymphadenopathy. Upper chest: No consolidation within the imaged lung apices. Review of the MIP images confirms the above findings CTA HEAD FINDINGS Anterior circulation: The intracranial left internal carotid artery remains occluded to the paraclinoid level. Reconstitution of enhancement within the supraclinoid left ICA. The intracranial right internal carotid artery is patent. Calcified atherosclerotic plaque within this vessel with no more than mild stenosis. The M1 middle cerebral arteries are patent. Atherosclerotic irregularity of the M2 and more distal MCA branches. No M2 proximal branch occlusion or high-grade proximal stenosis. The anterior cerebral  arteries are patent. Azygous configuration of the anterior cerebral arteries. Mildly hypoplastic left A1 segment. No intracranial aneurysm is identified. Posterior circulation: The intracranial vertebral arteries are patent. The basilar artery is patent. The posterior cerebral arteries are patent. Atherosclerotic irregularity of both vessels. Most notably, there is a moderate/severe stenosis within the left PCA P2 segment. A left posterior communicating artery is present. The right posterior communicating artery is diminutive or absent. Venous sinuses: Within the limitations of contrast timing, no convincing thrombus. Anatomic variants: As described grade Review of the MIP images confirms the above findings Attempts are being made to reach the ordering provider at this time. IMPRESSION: CTA neck: 1. 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 This is age-indeterminate. 2. The right common and internal carotid arteries are patent within the neck. Estimated 50% atherosclerotic narrowing at the right ICA origin. 3. Vertebral arteries patent within the neck. Up to moderate atherosclerotic narrowing of the left vertebral artery V3 segment. 4. Aortic Atherosclerosis (ICD10-I70.0). CTA head: 1. 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 This is age-indeterminate. Reconstitution of enhancement within the supraclinoid left ICA. 2. No proximal intracranial large vessel occlusion identified elsewhere. 3. Background intracranial atherosclerotic disease as described. Most notably, there is a moderate/severe stenosis within the left PCA P2 segment. Electronically Signed: By: Bascom Lily D.O. On: 08/21/2023 19:04   CT HEAD CODE STROKE WO CONTRAST Result Date: 08/21/2023 CLINICAL DATA:  Code stroke. Neuro deficit, acute, stroke suspected. Dizziness. Eye pain on the left, headache, nausea. EXAM: CT  HEAD WITHOUT CONTRAST TECHNIQUE: Contiguous axial images were obtained from the base of the skull through the vertex without intravenous contrast. RADIATION DOSE REDUCTION: This exam was performed according to the departmental dose-optimization  program which includes automated exposure control, adjustment of the mA and/or kV according to patient size and/or use of iterative reconstruction technique. COMPARISON:  Head CT 08/08/2023. FINDINGS: Brain: Generalized cerebral atrophy. Prominent perivascular spaces within the right basal ganglia inferiorly. Small chronic infarct again demonstrated within the inferior right cerebellar hemisphere. There is no acute intracranial hemorrhage. No demarcated cortical infarct. No extra-axial fluid collection. No evidence of an intracranial mass. No midline shift. Vascular: No hyperdense vessel. Atherosclerotic calcifications. Skull: No calvarial fracture or aggressive osseous lesion. Sinuses/Orbits: No mass or acute finding within the imaged orbits. No significant paranasal sinus disease at the imaged levels. ASPECTS [facility] Stroke Program Early CT Score) - Ganglionic level infarction (caudate, lentiform nuclei, internal capsule, insula, M1-M3 cortex): 7 - Supraganglionic infarction (M4-M6 cortex): 3 Total score (0-10 with 10 being normal): 10 No evidence of an acute intracranial abnormality. These results were communicated to Dr. Alecia Ames at 5:55 pmon 6/10/2025by text page via the [facility] messaging system. IMPRESSION: 1.  No evidence of an acute intracranial abnormality. 2. 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 3. Cerebral atrophy. Electronically Signed   By: Bascom Lily D.O.   On: 08/21/2023 17:56   CT Cervical Spine Wo Contrast Result Date: 08/08/2023 CLINICAL DATA:  Neck trauma (Age >= 65y) EXAM: CT CERVICAL SPINE WITHOUT CONTRAST TECHNIQUE: Multidetector CT imaging of the cervical spine was performed without intravenous  contrast. Multiplanar CT image reconstructions were also generated. RADIATION DOSE REDUCTION: This exam was performed according to the departmental dose-optimization program which includes automated exposure control, adjustment of the mA and/or kV according to patient size and/or use of iterative reconstruction technique. COMPARISON:  None Available. FINDINGS: Alignment: Straightening of normal lordosis. No traumatic subluxation. Skull base and vertebrae: No acute fracture. Vertebral body heights are maintained. Schmorl's node within inferior C7. The dens and skull base are intact. Soft tissues and spinal canal: No prevertebral fluid or swelling. No visible canal hematoma. Disc levels: Disc space narrowing and anterior spurring C4-C5, C5-C6, C6-C7 and C7-T1. Upper chest: No acute findings. Biapical pleuroparenchymal scarring. Other: Carotid calcifications. IMPRESSION: 1. No acute fracture or subluxation of the cervical spine. 2. Multilevel degenerative disc disease. Electronically Signed   By: Chadwick Colonel M.D.   On: 08/08/2023 17:33   DG Elbow 2 Views Left Result Date: 08/08/2023 CLINICAL DATA:  Pain after fall. EXAM: LEFT ELBOW - 2 VIEW COMPARISON:  None Available. FINDINGS: There is no evidence of fracture or dislocation. Right elevated anterior fat pad but no posterior fat pad elevation. There is no evidence of arthropathy or other focal bone abnormality. Soft tissues are unremarkable. IMPRESSION: 1. Possible small joint effusion. 2. No evidence of fracture or dislocation. If symptoms persist, recommend follow-up radiographs in 7-10 days. Electronically Signed   By: Chadwick Colonel M.D.   On: 08/08/2023 17:26   CT Head Wo Contrast Result Date: 08/08/2023 CLINICAL DATA:  Head trauma, minor (Age >= 65y) EXAM: CT HEAD WITHOUT CONTRAST TECHNIQUE: Contiguous axial images were obtained from the base of the skull through the vertex without intravenous contrast. RADIATION DOSE REDUCTION: This exam was  performed according to the departmental dose-optimization program which includes automated exposure control, adjustment of the mA and/or kV according to patient size and/or use of iterative reconstruction technique. COMPARISON:  None Available. FINDINGS: Brain: No intracranial hemorrhage, mass effect, or midline shift. Generalized atrophy. No hydrocephalus. The basilar cisterns are patent. Perivascular space versus remote lacunar infarct in the right basal ganglia. Remote right cerebellar lacunar  infarct. No evidence of territorial infarct or acute ischemia. No extra-axial or intracranial fluid collection. Vascular: Atherosclerosis of skullbase vasculature without hyperdense vessel or abnormal calcification. Skull: No fracture or focal lesion. Sinuses/Orbits: Paranasal sinuses and mastoid air cells are clear. The visualized orbits are unremarkable. Other: No confluent scalp hematoma. IMPRESSION: 1. No acute intracranial abnormality. No skull fracture. 2. Generalized atrophy. Remote right cerebellar lacunar infarct. Electronically Signed   By: Chadwick Colonel M.D.   On: 08/08/2023 17:24   DG Chest 2 View Result Date: 07/31/2023 CLINICAL DATA:  Shortness of breath. EXAM: CHEST - 2 VIEW COMPARISON:  02/02/2016. FINDINGS: Bilateral lung fields are clear. Bilateral costophrenic angles are clear. Normal cardio-mediastinal silhouette. There is a left sided 3-lead pacemaker. No acute osseous abnormalities. The soft tissues are within normal limits. IMPRESSION: *No active cardiopulmonary disease. Electronically Signed   By: [name] M.D.   On: 07/31/2023 14:19     PHYSICAL EXAM  Temp:  [97.1 F (36.2 C)-97.7 F (36.5 C)] 97.7 F (36.5 C) (06/15 1305) Pulse Rate:  [67-79] 69 (06/15 1305) Resp:  [11-22] 19 (06/15 1305) BP: (121-153)/(54-90) 121/62 (06/15 1305) SpO2:  [98 %-100 %] 100 % (06/15 1305)  General - Well nourished, well developed, in no apparent distress.  Ophthalmologic - fundi not  visualized due to noncooperation.  Cardiovascular - Regular rhythm and rate.  Mental Status -  Level of arousal and orientation to time, place, and person were intact. Language including expression, naming, repetition, comprehension was assessed and found intact. Fund of Knowledge was assessed and was intact.  Cranial Nerves II - XII - II - Visual field intact OU. III, IV, VI - Extraocular movements intact. V - Facial sensation intact bilaterally. VII - Facial movement intact bilaterally. VIII - Hearing & vestibular intact bilaterally. X - Palate elevates symmetrically. XI - Chin turning & shoulder shrug intact bilaterally. XII - Tongue protrusion intact.  Motor Strength - The patient's strength was normal in all extremities and pronator drift was absent.  Bulk was normal and fasciculations were absent.   Motor Tone - Muscle tone was assessed at the neck and appendages and was normal.  Reflexes - The patient's reflexes were symmetrical in all extremities and he had no pathological reflexes.  Sensory - Light touch, temperature/pinprick were assessed and were symmetrical.    Coordination - The patient had normal movements in the hands and feet with no ataxia or dysmetria.  Tremor was absent.  Gait and Station - deferred.   ASSESSMENT/PLAN Mr. Henrique Parekh. is a 78 y.o. male with history of CHF, PAF on Xarelto , RA, AICD, CKD 3, hypertension, CAD, recent fall admitted for headache, dizziness, nausea vomiting, transient left eye vision loss. No TNK given due to symptom resolved.    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  6/10 acute onset pain radiating from lower jaw to the frontal head on the  left with left painless vision loss.  The pain gradually eased off in 2 to 3 hours, left vision loss resumed within 3 to 4 hours. 6/14 stroke code due to new aphasia. Symptoms resolved later, MRI negative for acute stroke but angiogram showed new M3  occlusion.  CT no acute abnormality, old right cerebellar infarct CT head and neck left ICA occlusion, reconstituted at siphon.  Right ICA 50% stenosis, left P2 moderate to severe stenosis CT perfusion negative MRI no acute infarct Pending cerebral angiogram on Monday with intention to treat, discussed with Dr. Ethridge Herder 2D Echo EF 35 to 40% LDL 54 HgbA1c 5.6 UDS negative Xarelto  15 for VTE prophylaxis Xarelto  (rivaroxaban ) daily prior to admission, now on heparin  IV in preparation for cerebral angiogram. Per Dr. Alvira Josephs, pt will need ASA and brilinta prior to Monday procedure Ongoing aggressive stroke risk factor management Therapy recommendations: Outpatient PT Disposition: Pending  PAF on [facility] Xarelto  15 Compliant with medication Currently on heparin  IV in preparation for cerebral angiogram  CHF 01/2023 EF 30 to 35% 07/31/2023 admitted for acute on chronic heart failure This admission EF 35 to 40% On Lasix  40 daily  Hypertension Stable Avoid low BP given left ICA occlusion Long term BP goal normotensive  Hyperlipidemia Home meds: Crestor  5 LDL 54, goal < 70 Now on Crestor  5 No high intensity statin given LDL at goal Continue statin at discharge  Other Stroke Risk Factors Advanced age Coronary artery disease  Other Active Problems AICD placement Rheumatoid arthritis CKD 3, creatinine 1.84--2.12--1.83 Recent diarrhea  Hospital day # 2  I personally spent a total of 45 minutes in the care of the patient today including preparing to see the patient, getting/reviewing separately obtained history, performing a medically appropriate exam/evaluation, counseling and educating, documenting clinical information in the EHR,  and communicating results.   Cassandra Cleveland, MD Neurology 08/26/2023 3:55 PM    To contact Stroke Continuity provider, please refer to [email or web address]. After hours, contact General Neurology

## 2023-08-26 NOTE — Anesthesia Preprocedure Evaluation (Signed)
 Anesthesia Evaluation  Patient identified by MRN, date of birth, ID band Patient awake    Reviewed: Allergy & Precautions, NPO status , Patient's Chart, lab work & pertinent test results  History of Anesthesia Complications Negative for: history of anesthetic complications  Airway Mallampati: II  TM Distance: <3 FB Neck ROM: Full    Dental  (+) Dental Advisory Given   Pulmonary sleep apnea (does not use CPAP)    breath sounds clear to auscultation       Cardiovascular hypertension, Pt. on medications (-) angina + CAD (non-obstructive), + Peripheral Vascular Disease (L subclavian thrombosis) and +CHF  + dysrhythmias Atrial Fibrillation + Cardiac Defibrillator  Rhythm:Regular Rate:Normal + Systolic murmurs 0/34/7425 ECHO: EF 35-40%, severely decreased LVF, mild LVH, normal RVF, mod-severe MR with post leaflet prolapse, trivial AI   Neuro/Psych TIACVA, No Residual Symptoms    GI/Hepatic Neg liver ROS,GERD  Medicated and Controlled,,  Endo/Other  negative endocrine ROS    Renal/GU Renal InsufficiencyRenal disease     Musculoskeletal  (+) Arthritis , Rheumatoid disorders,    Abdominal   Peds  Hematology Xarelto    Anesthesia Other Findings   Reproductive/Obstetrics                             Anesthesia Physical Anesthesia Plan  ASA: 4  Anesthesia Plan: General   Post-op Pain Management: Tylenol  PO (pre-op)*   Induction: Intravenous  PONV Risk Score and Plan: 2 and Ondansetron  and Dexamethasone   Airway Management Planned: Oral ETT  Additional Equipment: Arterial line  Intra-op Plan:   Post-operative Plan: Extubation in OR  Informed Consent: I have reviewed the patients History and Physical, chart, labs and discussed the procedure including the risks, benefits and alternatives for the proposed anesthesia with the patient or authorized representative who has indicated his/her  understanding and acceptance.     Dental advisory given  Plan Discussed with: CRNA and Surgeon  Anesthesia Plan Comments:        Anesthesia Quick Evaluation

## 2023-08-26 NOTE — Progress Notes (Signed)
 [facility] night cross cover note:  Patient had episode of acute right-sided weakness and aphasia last night (6/14) around 1930. Dr. Bonnita Buttner evaluated the patient at that time. Updated CT head without acute process. CTA showed potential left M3 occlusion that was felt to be not amenable to acute intervention. Dr. Bonnita Buttner wrote a progress note, and recommended continuation of heparin  drip and frequent neuro checks. Neuro continuing to follow. Patient already scheduled for diagnostic cerebral angiogram to occur on Monday (6/16). Per Dr. Arora, if the patient has additional acute neuro changes, the timing of the cerebral angiogram may need to be re-addressed.    Camelia Cavalier, DO Hospitalist

## 2023-08-26 NOTE — Plan of Care (Signed)
  Problem: Coping: Goal: Will verbalize positive feelings about self Outcome: Progressing Goal: Will identify appropriate support needs Outcome: Progressing   Problem: Self-Care: Goal: Ability to participate in self-care as condition permits will improve Outcome: Progressing   Problem: Nutrition: Goal: Risk of aspiration will decrease Outcome: Progressing Goal: Dietary intake will improve Outcome: Progressing

## 2023-08-26 NOTE — Progress Notes (Signed)
 PROGRESS NOTE        PATIENT DETAILS Name: Brent Ray. Age: 78 y.o. Sex: male Date of Birth: 11/18/1945 Admit Date: 08/21/2023 Admitting Physician Pati Bonine, MD BJY:NWGNF, Boone Buzzard, MD  Brief Summary: Patient is a 78 y.o.  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   Significant events: 6/10>> admit to [facility] at [facility] 6/11>> transferred to [facility]  Significant studies: 6/10>> CT head: No acute intracranial abnormality. 6/10>> CTA head/neck: Left ICA occluded shortly beyond its origin- to the left paraclinoid level, 50% narrowing of right ICA origin.  Moderate to severe stenosis within the left PCA P2 segment. 6/10>> CT cerebral perfusion study: Negative for acute ischemia. 6/10>> A1c: 5.6 6/10>> UDS: Negative 6/11>> LDL: 54 6/11>> TTE: EF 35-40,+VE regional wall motion, moderate to severe mitral regurgitation  Significant microbiology data: None  Procedures: None  Consults: Neurology IR  Subjective: Patient in bed no headache, no chest or abdominal pain no focal weakness, last night had some transient aphasia lasting a few minutes.  This has resolved.   Objective: Vitals: Blood pressure (!) 121/54, pulse 70, temperature (!) 97.4 F (36.3 C), temperature source Oral, resp. rate 12, height 5' 8 (1.727 m), weight 56 kg, SpO2 100%.   Exam:  Awake Alert, No new F.N deficits, Normal affect [place].AT,PERRAL Supple Neck, No JVD,   Symmetrical Chest wall movement, Good air movement bilaterally, CTAB RRR,No Gallops, Rubs or new Murmurs,  +ve B.Sounds, Abd Soft, No  tenderness,   No Cyanosis, Clubbing or edema    Assessment/Plan:  Left visual field cut/dizziness/headache Concern for small stroke not seen on CT imaging-probably thromboembolic given left ICA occlusion Thankfully left visual scotoma/dizziness and headache have all resolved MRI +ve for acute CVA-Dr Christiane Cowing has d/w Neuro int radiology-recommenations are to proceed with a inpatient cerebral angiogram to fully evaluate ICA-as patient had a acute cva while on anticoagulation. Will start Heparin  per pharmacy protocol-24 hours after last xarelto  dose.  Held Xarelto  for now, currently on Hep gtt with aspirin  and Brilinta Night of 08/25/2023 he had transient aphasia, seen by neuro underwent CTA showing Focal filling defect within the cavernous left ICA at the level of the anterior genu consistent with small volume thrombus, likely migrated clot from the neck below given prior left ICA occlusion. Secondary severe stenosis at this level.Downstream acute distal left M3 occlusion, likely embolic. His symptoms have resolved for now no changes made by neurology continue to monitor with supportive care.  He is already on max treatment. He is scheduled for cerebral angiogram by IR on Monday, IR recommended to start aspirin  81 mg p.o. daily and Brilinta 90 mg p.o. twice daily.  Continue heparin  IV infusion.  Chronic HFrEF Euvolemic Continue furosemide , changed to as needed due to upcoming IV dye load Per prior notes-GDMT limited due to intolerance to multiple medications including beta-blocker/Aldactone /digoxin/SGL 2 inhibitors.  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  Rheumatoid arthritis On methotrexate  at home currently on hold  Code status:   Code Status: Full Code   DVT Prophylaxis:Heparin  gtt    Family Communication: Spouse was at bedside   Disposition Plan: Status is: Observation The patient will require care spanning > 2 midnights and should be moved to inpatient because: Severity of illness   Planned Discharge Destination:Home   Diet: Diet Order             Diet Heart Room service appropriate? Yes; Fluid consistency: Thin  Diet effective now                     Antimicrobial agents: Anti-infectives (From admission, onward)    None        Data Review:   Inpatient Medications  Scheduled Meds:   stroke: early stages of recovery book   Does not apply Once   aspirin  EC  81 mg Oral Daily   fluorescein   1 strip Both Eyes Once   leptospermum manuka honey  1 Application Topical Daily   mexiletine  150 mg Oral BID   pantoprazole   40 mg Oral QPM   rosuvastatin   5 mg Oral Q Sun   sodium chloride  flush  3-10 mL Intravenous Q12H   tetracaine   1 drop Both Eyes Once   ticagrelor  90 mg Oral BID   Continuous Infusions:  heparin  800 Units/hr (08/25/23 2002)   PRN Meds:.acetaminophen  **OR** acetaminophen  (TYLENOL ) oral liquid 160 mg/5 mL **OR** acetaminophen , hydrALAZINE , senna-docusate  DVT Prophylaxis  Hep gtt   Recent Labs  Lab 08/21/23 1235 08/21/23 1722 08/24/23 0647 08/25/23 1608  WBC 6.0 7.0 4.8 8.5  HGB 11.7* 12.4* 12.5* 13.8  HCT 34.0* 36.5* 37.5* 40.7  PLT 237 264 222 197  MCV 99.4 101.1* 98.4 98.8  MCH 34.2*  34.3* 32.8 33.5  MCHC 34.4 34.0 33.3 33.9  RDW 14.8 14.9 14.7 15.0  LYMPHSABS  --  0.9  --   --   MONOABS  --  0.2  --   --   EOSABS  --  0.1  --   --   BASOSABS  --  0.1  --   --     Recent Labs  Lab 08/21/23 1235 08/21/23 1722 08/23/23 0736 08/25/23 0644  NA 140 140 139 138  K 3.9 3.4* 3.7 3.3*  CL 114* 114* 111 107  CO2 19* 15* 17* 19*  ANIONGAP 7 11 11 12   GLUCOSE 87 128* 88 96  BUN 28* 32* 21 23  CREATININE 1.84* 2.12* 1.83* 2.24*  AST  --  37  --   --   ALT  --  33  --   --   ALKPHOS  --  57  --   --   BILITOT  --  1.3*  --   --   ALBUMIN   --  3.5  --   --   HGBA1C  --  5.6  --   --   MG  --   --  1.9 1.9  PHOS  --   --   --  3.3  CALCIUM  8.8* 8.8* 8.6* 9.0      Recent Labs  Lab 08/21/23 1235 08/21/23 1722 08/23/23 0736 08/25/23 0644  HGBA1C  --  5.6  --   --   MG  --   --  1.9 1.9  CALCIUM  8.8* 8.8* 8.6* 9.0    --------------------------------------------------------------------------------------------------------------- Lab Results  Component Value Date   CHOL 125 08/22/2023   HDL 62 08/22/2023   LDLCALC 54 08/22/2023   TRIG 43 08/22/2023   CHOLHDL 2.0 08/22/2023    Lab Results  Component Value Date   HGBA1C 5.6 08/21/2023   No results for input(s): TSH, T4TOTAL, FREET4, T3FREE, THYROIDAB in the last 72 hours. No results for input(s): VITAMINB12, FOLATE, FERRITIN, TIBC, IRON, RETICCTPCT in the last 72 hours. ------------------------------------------------------------------------------------------------------------------ Cardiac Enzymes No results for input(s): CKMB, TROPONINI, MYOGLOBIN in the last 168 hours.  Invalid input(s): CK  Micro Results No results found for this or any previous visit (from the past 240 hours).  Radiology Reports  DG Chest Port 1 View Result Date: 08/26/2023 CLINICAL DATA:  Shortness of breath. EXAM: PORTABLE CHEST 1 VIEW COMPARISON:  07/31/2023 FINDINGS: The lungs are clear  without focal pneumonia, edema, pneumothorax or pleural effusion. Cardiopericardial silhouette is at upper limits of normal for size. Left-sided permanent pacemaker/AICD again noted. Telemetry leads overlie the chest. No acute bony abnormality. IMPRESSION: No active disease. Electronically Signed   By: Donnal Fusi M.D.   On: 08/26/2023 06:43   CT ANGIO HEAD NECK W WO CM W PERF (CODE STROKE) Result Date: 08/25/2023 CLINICAL DATA:  Initial evaluation for acute neuro deficit, stroke suspected, right-sided weakness. Aphasia. EXAM: CT ANGIOGRAPHY HEAD AND NECK CT PERFUSION BRAIN TECHNIQUE: Multidetector CT imaging of the head and neck was performed using the standard protocol during bolus administration of intravenous contrast. Multiplanar CT image reconstructions and MIPs were obtained to evaluate the vascular anatomy. Carotid stenosis measurements (when applicable) are obtained utilizing NASCET criteria, using the distal internal carotid diameter as the denominator. Multiphase CT imaging of the brain was performed following IV bolus contrast injection. Subsequent parametric perfusion maps were calculated using RAPID software. RADIATION DOSE REDUCTION: This exam was performed according to the departmental dose-optimization program which includes automated exposure control, adjustment of the mA and/or kV according to patient size and/or use of iterative reconstruction technique. CONTRAST:  100mL OMNIPAQUE  IOHEXOL  350 MG/ML SOLN COMPARISON:  CT from earlier the same day as well as prior studies from 08/21/2023. FINDINGS: CTA NECK FINDINGS Aortic arch: Visualized aortic arch within normal limits for caliber standard 3 vessel morphology. Aortic atherosclerosis. No significant stenosis about the origin the great vessels. Right carotid system: Right common and internal carotid arteries are patent without dissection. Moderate calcified plaque about the right carotid bulb without hemodynamically significant greater than  50% stenosis. Minimal irregularity about the mid cervical right ICA noted, favored atherosclerotic in nature. Left carotid system: Left common carotid artery remains patent to the bifurcation without stenosis. Atheromatous change about the left carotid bifurcation without hemodynamically significant stenosis. Previously seen left ICA occlusion is no longer seen, with interval revascularization of the left ICA in the neck. Left ICA remains patent the skull base without significant stenosis or evidence for dissection. Vertebral arteries: Both vertebral arteries arise from subclavian arteries. No proximal subclavian artery stenosis. Evaluation of vertebral artery somewhat limited by adjacent venous contamination. Tubal arteries M cells are patent without stenosis or dissection. Skeleton: No discrete  or worrisome osseous lesions. Moderate cervical spondylosis. Other neck: No other acute finding. Upper chest: Streak artifact from a left-sided pacemaker/AICD. No other acute finding. Review of the MIP images confirms the above findings CTA HEAD FINDINGS Anterior circulation: Atheromatous change about the carotid siphons bilaterally. No significant stenosis on the right. The left ICA is now patent to the terminus. Focal filling defect within the cavernous left ICA at the level of the anterior genu consistent with small volume thrombus (series 5, image 133). This likely reflects a small amount of migrated clot from the neck below given prior left ICA occlusion. A1 segments patent bilaterally. Normal anterior communicating artery complex. Azygous ACA widely patent without stenosis. No M1 stenosis or occlusion. Right MCA branches patent and well perfused. On the left, there is a distal left M3 occlusion, likely embolic (a series 5, image 99). Remainder of the left MCA branches remain patent. Posterior circulation: Both V4 segments patent without stenosis. Both PICA patent at their origins. Basilar patent without stenosis.  Superior cerebral arteries patent bilaterally. Atheromatous irregularity about the PCAs with associated mild to moderate bilateral P2 stenoses. PCAs remain patent to their distal aspects. Venous sinuses: Grossly patent allowing for timing the contrast bolus. Anatomic variants: Azygous ACA.  No aneurysm. Review of the MIP images confirms the above findings CT Brain Perfusion Findings: ASPECTS: 10 CBF (<30%) Volume: 0mL Perfusion (Tmax>6.0s) volume: 20mL Mismatch Volume: 20mL Infarction Location:Negative CT perfusion for acute core infarct. 20 mL area of delayed perfusion at the mid-posterior left frontal lobe, in keeping with the distal left M3 occlusion. No other perfusion abnormality. IMPRESSION: 1. Interval revascularization of the left ICA in the neck, with the left ICA now patent to the terminus. Focal filling defect within the cavernous left ICA at the level of the anterior genu consistent with small volume thrombus, likely migrated clot from the neck below given prior left ICA occlusion. Secondary severe stenosis at this level. 2. Downstream acute distal left M3 occlusion, likely embolic. 3. Negative CT perfusion for acute core infarct. 20 mL area of delayed perfusion at the mid-posterior left frontal lobe, in keeping with the left M3 occlusion. 4. Atheromatous change about the carotid bifurcations and carotid siphons without hemodynamically significant stenosis. 5.  Aortic Atherosclerosis (ICD10-I70.0). Results were discussed by telephone at the time of interpretation on 08/25/2023 at 9:10 p.m. to provider [facility] , who verbally acknowledged these results. Electronically Signed   By: Virgia Griffins M.D.   On: 08/25/2023 21:55   CT HEAD CODE STROKE WO CONTRAST Result Date: 08/25/2023 CLINICAL DATA:  Code stroke. Initial evaluation for acute neuro deficit, stroke suspected. EXAM: CT HEAD WITHOUT CONTRAST TECHNIQUE: Contiguous axial images were obtained from the base of the skull through the vertex  without intravenous contrast. RADIATION DOSE REDUCTION: This exam was performed according to the departmental dose-optimization program which includes automated exposure control, adjustment of the mA and/or kV according to patient size and/or use of iterative reconstruction technique. COMPARISON:  Prior studies from 08/21/2023 FINDINGS: Brain: Mild age-related cerebral atrophy with chronic small vessel ischemic disease. Small remote right cerebellar infarct. No acute intracranial hemorrhage. No acute large vessel territory infarct. No mass lesion or midline shift. No hydrocephalus or extra-axial fluid collection. Vascular: No abnormal hyperdense vessel. Calcified atherosclerosis present at the skull base. Skull: Scalp soft tissues within normal limits.  Calvarium intact. Sinuses/Orbits: Globes orbital soft tissues within normal limits. Paranasal sinuses are clear. No mastoid effusion. Other: None. ASPECTS [facility] Stroke Program Early CT Score) -  Ganglionic level infarction (caudate, lentiform nuclei, internal capsule, insula, M1-M3 cortex): 7 - Supraganglionic infarction (M4-M6 cortex): 3 Total score (0-10 with 10 being normal): 10 IMPRESSION: 1. No acute intracranial abnormality. 2. ASPECTS is 10. 3. 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 These results were communicated to Dr. Arora at 8:52 pm on 08/25/2023 by text page via the [facility] messaging system. Electronically Signed   By: Virgia Griffins M.D.   On: 08/25/2023 20:55      Signature  -   Lynnwood Sauer M.D on 08/26/2023 at 9:35 AM   -  To page go to [email or web address]

## 2023-08-26 NOTE — Progress Notes (Signed)
 Attempted to call lab for stat lab draw.Couldn't be reached out right now.

## 2023-08-26 NOTE — Progress Notes (Signed)
 PHARMACY - ANTICOAGULATION CONSULT NOTE  Pharmacy Consult for Heparin  Indication: stroke (hx afib and DVT - chronic Eliquis)  Allergies  Allergen Reactions   Amiodarone Other (See Comments)    Pt reports vision changes and walking changes    Flecainide  Other (See Comments)    Severe headaches, blurred vision   Carvedilol  Other (See Comments)    Unknown    Dapagliflozin Diarrhea   Eplerenone Other (See Comments)    Unknown    Flomax [Tamsulosin Hcl] Hives, Itching and Rash   Lisinopril  Nausea And Vomiting   Metoprolol  Other (See Comments)    Headaches, dizziness   Sacubitril-Valsartan Nausea And Vomiting   Spironolactone  Swelling and Other (See Comments)    Breast swelling, headaches   Statins Other (See Comments)    Leg cramps   Sulfasalazine Nausea Only    Patient Measurements: Height: 5' 8 (172.7 cm) Weight: 56 kg (123 lb 6.4 oz) IBW/kg (Calculated) : 68.4 HEPARIN  DW (KG): 56  Vital Signs: Temp: 97.7 F (36.5 C) (06/15 1305) Temp Source: Oral (06/15 1305) BP: 121/62 (06/15 1305) Pulse Rate: 69 (06/15 1305)  Labs: Recent Labs    08/24/23 0647 08/24/23 1837 08/25/23 0644 08/25/23 1608 08/25/23 1929 08/26/23 1134  HGB 12.5*  --   --  13.8  --  12.2*  HCT 37.5*  --   --  40.7  --  36.6*  PLT 222  --   --  197  --  245  APTT 54* 59* 110*  --   --   --   HEPARINUNFRC 0.67 0.44 0.84*  --  0.55 0.58  CREATININE  --   --  2.24*  --   --  2.00*    Estimated Creatinine Clearance: 25.3 mL/min (A) (by C-G formula based on SCr of 2 mg/dL (H)).   Medical History: Past Medical History:  Diagnosis Date   A-fib [facility])    AICD (automatic cardioverter/defibrillator) present    Barrett's esophagus 2011   In [place] . Records in Epic   CHF (congestive heart failure) (HCC)    Chronic kidney disease    Coronary atherosclerosis of native coronary artery    Nonobstructive   Essential hypertension, benign    GERD (gastroesophageal reflux disease)    Heart block AV  first degree    Chronic   History of kidney stones    Hypercholesterolemia    Mixed hyperlipidemia    NSVT (nonsustained ventricular tachycardia) (HCC)    Holter monitor, 2011; recommended RF ablation by Dr. Nunzio Belch - never required   PAF (paroxysmal atrial fibrillation) [facility])    Recently documented in [facility] June 2016   Pre-diabetes    Rheumatoid arthritis (HCC)    Sinus bradycardia    Symptomatic, acebutolol discontinued   Skin cancer    Sleep apnea    pt denies    Medications:  Scheduled:    stroke: early stages of recovery book   Does not apply Once   aspirin  EC  81 mg Oral Daily   fluorescein   1 strip Both Eyes Once   leptospermum manuka honey  1 Application Topical Daily   mexiletine  150 mg Oral BID   pantoprazole   40 mg Oral QPM   rosuvastatin   5 mg Oral Q Sun   sodium chloride  flush  3-10 mL Intravenous Q12H   tetracaine   1 drop Both Eyes Once   ticagrelor  90 mg Oral BID   PRN: acetaminophen  **OR** acetaminophen  (TYLENOL ) oral liquid 160 mg/5 mL **OR** acetaminophen , hydrALAZINE ,  senna-docusate  Assessment: 78 yo male on chronic Xarelto  for hx afib and DVT now with 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 Last Xarelto  dose 6/11 @1530 . Pharmacy consulted to transition to IV heparin , in preparation for cerebral angiogram on Monday 6/16.   Heparin  level is supratherapeutic (0.58) on 800 units/hr. Heparin  level drawn from opposite arm of heparin  infusion. No issues with infusion or s/sx of bleeding per RN. CBC stable.   Goal of Therapy:  Heparin  level 0.3-0.5 units/ml Monitor platelets by anticoagulation protocol: Yes   Plan:  Reduce heparin  gtt to 750 units/hr F/u 8hr heparin  level Daily heparin  level and CBC Monitor for s/sx of bleeding F/u plans to resume oral AC after angiogram  Abelina Abide, PharmD PGY1 Pharmacy Resident 08/26/2023 1:21 PM

## 2023-08-27 ENCOUNTER — Inpatient Hospital Stay (HOSPITAL_COMMUNITY): Payer: Self-pay | Admitting: Anesthesiology

## 2023-08-27 ENCOUNTER — Encounter (HOSPITAL_COMMUNITY)
Admission: EM | Disposition: A | Discharge status: Home / Self Care | Payer: Self-pay | Source: Home / Self Care | Attending: Internal Medicine

## 2023-08-27 ENCOUNTER — Inpatient Hospital Stay (HOSPITAL_COMMUNITY)

## 2023-08-27 DIAGNOSIS — I63432 Cerebral infarction due to embolism of left posterior cerebral artery: Secondary | ICD-10-CM

## 2023-08-27 DIAGNOSIS — I7771 Dissection of carotid artery: Secondary | ICD-10-CM | POA: Diagnosis not present

## 2023-08-27 DIAGNOSIS — I771 Stricture of artery: Secondary | ICD-10-CM

## 2023-08-27 DIAGNOSIS — I2510 Atherosclerotic heart disease of native coronary artery without angina pectoris: Secondary | ICD-10-CM

## 2023-08-27 DIAGNOSIS — R42 Dizziness and giddiness: Secondary | ICD-10-CM | POA: Diagnosis not present

## 2023-08-27 DIAGNOSIS — G453 Amaurosis fugax: Secondary | ICD-10-CM | POA: Diagnosis not present

## 2023-08-27 DIAGNOSIS — W19XXXA Unspecified fall, initial encounter: Secondary | ICD-10-CM | POA: Diagnosis not present

## 2023-08-27 DIAGNOSIS — I4891 Unspecified atrial fibrillation: Secondary | ICD-10-CM

## 2023-08-27 DIAGNOSIS — I6522 Occlusion and stenosis of left carotid artery: Secondary | ICD-10-CM | POA: Diagnosis present

## 2023-08-27 DIAGNOSIS — I480 Paroxysmal atrial fibrillation: Secondary | ICD-10-CM | POA: Diagnosis not present

## 2023-08-27 DIAGNOSIS — G4733 Obstructive sleep apnea (adult) (pediatric): Secondary | ICD-10-CM

## 2023-08-27 HISTORY — PX: RADIOLOGY WITH ANESTHESIA: SHX6223

## 2023-08-27 HISTORY — PX: IR ANGIO VERTEBRAL SEL VERTEBRAL UNI L MOD SED: IMG5367

## 2023-08-27 HISTORY — PX: IR ANGIO INTRA EXTRACRAN SEL COM CAROTID INNOMINATE BILAT MOD SED: IMG5360

## 2023-08-27 LAB — TYPE AND SCREEN
ABO/RH(D): O POS
Antibody Screen: NEGATIVE

## 2023-08-27 LAB — CBC
HCT: 35 % — ABNORMAL LOW (ref 39.0–52.0)
Hemoglobin: 12.1 g/dL — ABNORMAL LOW (ref 13.0–17.0)
MCH: 34.4 pg — ABNORMAL HIGH (ref 26.0–34.0)
MCHC: 34.6 g/dL (ref 30.0–36.0)
MCV: 99.4 fL (ref 80.0–100.0)
Platelets: 237 10*3/uL (ref 150–400)
RBC: 3.52 MIL/uL — ABNORMAL LOW (ref 4.22–5.81)
RDW: 15.5 % (ref 11.5–15.5)
WBC: 6.6 10*3/uL (ref 4.0–10.5)
nRBC: 0 % (ref 0.0–0.2)

## 2023-08-27 LAB — BASIC METABOLIC PANEL WITH GFR
Anion gap: 8 (ref 5–15)
BUN: 25 mg/dL — ABNORMAL HIGH (ref 8–23)
CO2: 19 mmol/L — ABNORMAL LOW (ref 22–32)
Calcium: 8.7 mg/dL — ABNORMAL LOW (ref 8.9–10.3)
Chloride: 111 mmol/L (ref 98–111)
Creatinine, Ser: 2.14 mg/dL — ABNORMAL HIGH (ref 0.61–1.24)
GFR, Estimated: 31 mL/min — ABNORMAL LOW (ref >60)
Glucose, Bld: 97 mg/dL (ref 70–99)
Potassium: 3.7 mmol/L (ref 3.5–5.1)
Sodium: 138 mmol/L (ref 135–145)

## 2023-08-27 LAB — MAGNESIUM: Magnesium: 2 mg/dL (ref 1.7–2.4)

## 2023-08-27 LAB — PROTIME-INR
INR: 1.1 (ref 0.8–1.2)
Prothrombin Time: 14.3 s (ref 11.4–15.2)

## 2023-08-27 LAB — HEPARIN LEVEL (UNFRACTIONATED)
Heparin Unfractionated: 0.4 [IU]/mL (ref 0.30–0.70)
Heparin Unfractionated: 0.48 [IU]/mL (ref 0.30–0.70)

## 2023-08-27 LAB — PHOSPHORUS: Phosphorus: 3 mg/dL (ref 2.5–4.6)

## 2023-08-27 SURGERY — RADIOLOGY WITH ANESTHESIA
Anesthesia: General

## 2023-08-27 MED ORDER — SODIUM CHLORIDE 0.9 % IV SOLN: INTRAVENOUS | Status: AC

## 2023-08-27 MED ORDER — CHLORHEXIDINE GLUCONATE 0.12 % MT SOLN
15.0000 mL | Freq: Once | OROMUCOSAL | Status: AC
  Administered 2023-08-27: 15 mL via OROMUCOSAL
  Filled 2023-08-27: qty 15

## 2023-08-27 MED ORDER — SODIUM CHLORIDE 0.9 % IV SOLN: INTRAVENOUS | Status: DC

## 2023-08-27 MED ORDER — HEPARIN SODIUM (PORCINE) 1000 UNIT/ML IJ SOLN
INTRAMUSCULAR | Status: DC | PRN
  Administered 2023-08-27: 2000 [IU] via INTRAVENOUS

## 2023-08-27 MED ORDER — LIDOCAINE HCL 1 % IJ SOLN
INTRAMUSCULAR | Status: AC
  Filled 2023-08-27: qty 20

## 2023-08-27 MED ORDER — FENTANYL CITRATE (PF) 250 MCG/5ML IJ SOLN
INTRAMUSCULAR | Status: DC | PRN
  Administered 2023-08-27: 100 ug via INTRAVENOUS

## 2023-08-27 MED ORDER — ROCURONIUM BROMIDE 10 MG/ML (PF) SYRINGE
PREFILLED_SYRINGE | INTRAVENOUS | Status: DC | PRN
  Administered 2023-08-27: 60 mg via INTRAVENOUS

## 2023-08-27 MED ORDER — ACETAMINOPHEN 650 MG RE SUPP: 650.0000 mg | RECTAL | Status: DC | PRN

## 2023-08-27 MED ORDER — HYDRALAZINE HCL 20 MG/ML IJ SOLN
INTRAMUSCULAR | Status: DC | PRN
  Administered 2023-08-27: 10 mg via INTRAVENOUS

## 2023-08-27 MED ORDER — APIXABAN 2.5 MG PO TABS
2.5000 mg | ORAL_TABLET | Freq: Two times a day (BID) | ORAL | Status: DC
  Administered 2023-08-27 – 2023-08-28 (×3): 2.5 mg via ORAL
  Filled 2023-08-27 (×6): qty 1

## 2023-08-27 MED ORDER — TICAGRELOR 90 MG PO TABS: 90.0000 mg | ORAL_TABLET | Freq: Two times a day (BID) | ORAL | Status: DC

## 2023-08-27 MED ORDER — CLEVIDIPINE BUTYRATE 0.5 MG/ML IV EMUL
INTRAVENOUS | Status: DC | PRN
Start: 2023-08-27 — End: 2023-08-27
  Administered 2023-08-27: 2 mg/h via INTRAVENOUS

## 2023-08-27 MED ORDER — MIDAZOLAM HCL 2 MG/2ML IJ SOLN: 0.5000 mg | Freq: Once | INTRAMUSCULAR | Status: DC | PRN

## 2023-08-27 MED ORDER — ORAL CARE MOUTH RINSE: 15.0000 mL | Freq: Once | OROMUCOSAL | Status: AC

## 2023-08-27 MED ORDER — ACETAMINOPHEN 325 MG PO TABS: 650.0000 mg | ORAL_TABLET | ORAL | Status: DC | PRN

## 2023-08-27 MED ORDER — FENTANYL CITRATE (PF) 100 MCG/2ML IJ SOLN
INTRAMUSCULAR | Status: AC
  Filled 2023-08-27: qty 2

## 2023-08-27 MED ORDER — OXYCODONE HCL 5 MG/5ML PO SOLN: 5.0000 mg | Freq: Once | ORAL | Status: DC | PRN

## 2023-08-27 MED ORDER — IOHEXOL 300 MG/ML  SOLN
150.0000 mL | Freq: Once | INTRAMUSCULAR | Status: AC | PRN
  Administered 2023-08-27: 60 mL via INTRA_ARTERIAL

## 2023-08-27 MED ORDER — CEFAZOLIN SODIUM-DEXTROSE 2-4 GM/100ML-% IV SOLN
INTRAVENOUS | Status: AC
  Filled 2023-08-27: qty 100

## 2023-08-27 MED ORDER — PROPOFOL 10 MG/ML IV BOLUS
INTRAVENOUS | Status: DC | PRN
  Administered 2023-08-27: 80 mg via INTRAVENOUS

## 2023-08-27 MED ORDER — ASPIRIN 81 MG PO CHEW
81.0000 mg | CHEWABLE_TABLET | Freq: Every day | ORAL | Status: DC
  Administered 2023-08-28: 81 mg via ORAL
  Filled 2023-08-27: qty 1

## 2023-08-27 MED ORDER — ACETAMINOPHEN 160 MG/5ML PO SOLN: 650.0000 mg | ORAL | Status: DC | PRN

## 2023-08-27 MED ORDER — CEFAZOLIN SODIUM-DEXTROSE 2-3 GM-%(50ML) IV SOLR
INTRAVENOUS | Status: DC | PRN
  Administered 2023-08-27: 2 g via INTRAVENOUS

## 2023-08-27 MED ORDER — PHENYLEPHRINE 80 MCG/ML (10ML) SYRINGE FOR IV PUSH (FOR BLOOD PRESSURE SUPPORT)
PREFILLED_SYRINGE | INTRAVENOUS | Status: DC | PRN
  Administered 2023-08-27: 160 ug via INTRAVENOUS

## 2023-08-27 MED ORDER — SUGAMMADEX SODIUM 200 MG/2ML IV SOLN
INTRAVENOUS | Status: DC | PRN
  Administered 2023-08-27: 150 mg via INTRAVENOUS

## 2023-08-27 MED ORDER — PHENYLEPHRINE HCL-NACL 20-0.9 MG/250ML-% IV SOLN
INTRAVENOUS | Status: DC | PRN
  Administered 2023-08-27: 50 ug/min via INTRAVENOUS

## 2023-08-27 MED ORDER — CHLORHEXIDINE GLUCONATE CLOTH 2 % EX PADS
6.0000 | MEDICATED_PAD | Freq: Every day | CUTANEOUS | Status: DC
  Administered 2023-08-27 – 2023-08-28 (×2): 6 via TOPICAL

## 2023-08-27 MED ORDER — DEXAMETHASONE SODIUM PHOSPHATE 10 MG/ML IJ SOLN
INTRAMUSCULAR | Status: DC | PRN
  Administered 2023-08-27: 10 mg via INTRAVENOUS

## 2023-08-27 MED ORDER — CLEVIDIPINE BUTYRATE 0.5 MG/ML IV EMUL
0.0000 mg/h | INTRAVENOUS | Status: AC
  Administered 2023-08-27: 4 mg/h via INTRAVENOUS
  Administered 2023-08-27: 2 mg/h via INTRAVENOUS
  Filled 2023-08-27 (×2): qty 50

## 2023-08-27 MED ORDER — LIDOCAINE 2% (20 MG/ML) 5 ML SYRINGE
INTRAMUSCULAR | Status: DC | PRN
  Administered 2023-08-27: 40 mg via INTRAVENOUS

## 2023-08-27 MED ORDER — FENTANYL CITRATE (PF) 100 MCG/2ML IJ SOLN: 25.0000 ug | INTRAMUSCULAR | Status: DC | PRN

## 2023-08-27 MED ORDER — CLEVIDIPINE BUTYRATE 0.5 MG/ML IV EMUL
INTRAVENOUS | Status: AC
  Filled 2023-08-27: qty 50

## 2023-08-27 MED ORDER — SODIUM CHLORIDE 0.9 % IV SOLN: INTRAVENOUS | Status: DC | PRN

## 2023-08-27 MED ORDER — ASPIRIN 81 MG PO CHEW
81.0000 mg | CHEWABLE_TABLET | Freq: Every day | ORAL | Status: DC
  Filled 2023-08-27: qty 1

## 2023-08-27 MED ORDER — OXYCODONE HCL 5 MG PO TABS: 5.0000 mg | ORAL_TABLET | Freq: Once | ORAL | Status: DC | PRN

## 2023-08-27 MED ORDER — ONDANSETRON HCL 4 MG/2ML IJ SOLN
INTRAMUSCULAR | Status: DC | PRN
  Administered 2023-08-27: 4 mg via INTRAVENOUS

## 2023-08-27 NOTE — H&P (Signed)
 Chief Complaint: Patient was seen in consultation today for Left internal carotid stenosis--- arteriogram with possible treatment Chief Complaint  Patient presents with   Dizziness   at the request of Xu,Jindong  Referring Physician(s): Xu,Jindong  Supervising Physician: Luellen Sages  Patient Status: [facility] - In-pt  History of Present Illness: Brent Ray. is a 78 y.o. male   FULL Code status per pt  Findings of left carotid stenosis. Brief hx, pt was driving and had episode of sharp pain up his left neck/face and subsequent left eye blindness that lasted a couple hours, but has completely resolved. He initially went to [facility] but has been transferred here. His CTA shows (L)intracranial ICA stenosis vs occlusion with reconstitution of the supraclinoid ICA. Neuro team consulted and has reached out to [facility] team for consideration of angiogram and possible intervention  Planned for IR procedure today  Past Medical History:  Diagnosis Date   A-fib [facility])    AICD (automatic cardioverter/defibrillator) present    Barrett's esophagus 2011   In [place] . Records in Epic   CHF (congestive heart failure) (HCC)    Chronic kidney disease    Coronary atherosclerosis of native coronary artery    Nonobstructive   Essential hypertension, benign    GERD (gastroesophageal reflux disease)    Heart block AV first degree    Chronic   History of kidney stones    Hypercholesterolemia    Mixed hyperlipidemia    NSVT (nonsustained ventricular tachycardia) (HCC)    Holter monitor, 2011; recommended RF ablation by Dr. Nunzio Belch - never required   PAF (paroxysmal atrial fibrillation) [facility])    Recently documented in [facility] June 2016   Pre-diabetes    Rheumatoid arthritis (HCC)    Sinus bradycardia    Symptomatic, acebutolol discontinued   Skin cancer    Sleep apnea    pt denies    Past Surgical History:  Procedure Laterality Date   CARDIAC CATHETERIZATION N/A 12/13/2015    Procedure: Left Heart Cath and Coronary Angiography;  Surgeon: Avanell Leigh, MD;  Location: [facility] INVASIVE CV LAB;  Service: Cardiovascular;  Laterality: N/A;   CARDIOVERSION N/A 04/11/2016   Procedure: CARDIOVERSION;  Surgeon: Maudine Sos, MD;  Location: [facility] ENDOSCOPY;  Service: Cardiovascular;  Laterality: N/A;   COLONOSCOPY  03/13/2012   [place]--Dr. Spainhour   EP IMPLANTABLE DEVICE N/A 02/01/2016   Procedure: Pacemaker Implant;  Surgeon: Jolly Needle, MD;  Location: [facility] INVASIVE CV LAB;  Service: Cardiovascular;  Laterality: N/A;   ESOPHAGOGASTRODUODENOSCOPY  03/13/2012   [place], [place]--Dr. Spainhour   HERNIA REPAIR     2   KIDNEY STONE SURGERY     LEFT HEART CATHETERIZATION WITH CORONARY ANGIOGRAM N/A 08/20/2012   Procedure: LEFT HEART CATHETERIZATION WITH CORONARY ANGIOGRAM;  Surgeon: Eilleen Grates, MD;  Location: [facility] CATH LAB;  Service: Cardiovascular;  Laterality: N/A;   Left leg surgery  03/13/1977   SKIN CANCER EXCISION     TOTAL HIP ARTHROPLASTY Left 01/11/2022   Procedure: TOTAL HIP ARTHROPLASTY ANTERIOR APPROACH;  Surgeon: Adonica Hoose, MD;  Location: [facility] ORS;  Service: Orthopedics;  Laterality: Left;  150    Allergies: Amiodarone, Flecainide , Carvedilol , Dapagliflozin, Eplerenone, Flomax [tamsulosin hcl], Lisinopril , Metoprolol , Sacubitril-valsartan, Spironolactone , Statins, and Sulfasalazine  Medications: Prior to Admission medications   Medication Sig Start Date End Date Taking? Authorizing Provider  cephALEXin (KEFLEX) 500 MG capsule Take 500 mg by mouth once. Only for dental appointments 10/21/22  Yes [provider]  CRANBERRY PO Take 500 mg by  mouth daily.   Yes [provider]  folic acid  (FOLVITE ) 1 MG tablet Take 1 mg by mouth daily.  NOT on Sunday d/t methotrexate  12/13/22  Yes [provider]  furosemide  (LASIX ) 20 MG tablet Take 1 tablet (20 mg total) by mouth daily. Patient taking differently: Take 20 mg by mouth every other  day. 08/21/23  Yes Sabharwal, Aditya, DO  losartan  (COZAAR ) 25 MG tablet Take 0.5 tablets (12.5 mg total) by mouth daily. 08/21/23  Yes Sabharwal, Aditya, DO  methotrexate  (RHEUMATREX) 2.5 MG tablet Take 2.5 mg by mouth every Friday. 6 pills weekly   Yes [provider]  mexiletine (MEXITIL ) 150 MG capsule Take 150 mg by mouth 2 (two) times daily.   Yes [provider]  NITROSTAT  0.4 MG SL tablet DISSOLVE 1 TABLET UNDER TONGUE EVERY 5 MINUTES AS NEEDED FOR CHEST PAIN Patient taking differently: Place 0.4 mg under the tongue every 5 (five) minutes as needed for chest pain. 05/11/14  Yes Gerard Knight, MD  ondansetron  (ZOFRAN ) 4 MG tablet Take 4 mg by mouth every 8 (eight) hours as needed for nausea. 01/15/23  Yes [provider]  pantoprazole  (PROTONIX ) 40 MG tablet Take 40 mg by mouth every evening.   Yes [provider]  potassium chloride  SA (KLOR-CON  M) 20 MEQ tablet Take 20 mEq by mouth daily.   Yes [provider]  Probiotic Product (PROBIOTIC DAILY PO) Take 110 mg by mouth daily.   Yes [provider]  Rivaroxaban  (XARELTO ) 15 MG TABS tablet Take 15 mg by mouth daily in the afternoon.   Yes [provider]  rosuvastatin  (CRESTOR ) 5 MG tablet Take 5 mg by mouth every Sunday.   Yes [provider]     Family History  Problem Relation Age of Onset   Colon cancer Mother    Other Sister         AGE-18-HEALTHY   Other Sister        AGE 91-HEALTHY   Other Sister        AGE 81 HEALTHY   Other Son        AGE 55 HEALTHY    Social History   Socioeconomic History   Marital status: Married    Spouse name: Not on file   Number of children: 1   Years of education: Not on file   Highest education level: Not on file  Occupational History   Occupation: [employer]    Comment: Quarry manager  Tobacco Use   Smoking status: Never    Passive exposure: Never   Smokeless tobacco: Never  Vaping Use   Vaping  status: Never Used  Substance and Sexual Activity   Alcohol  use: Yes    Alcohol /week: 2.0 standard drinks of alcohol     Types: 2 Cans of beer per week    Comment: Occasional beer   Drug use: No   Sexual activity: Yes    Partners: Female  Other Topics Concern   Not on file  Social History Narrative   Daily caffeine    Social Drivers of Health   Financial Resource Strain: Low Risk  (07/01/2021)   Received from [facility]   Overall Financial Resource Strain (CARDIA)  Food Insecurity: No Food Insecurity (08/23/2023)   Hunger Vital Sign    Worried About Running Out of Food in the Last Year: Never true    Ran Out of Food in the Last Year: Never true  Transportation Needs: No Transportation  Needs (08/22/2023)   PRAPARE - Administrator, Civil Service (Medical): No    Lack of Transportation (Non-Medical): No  Physical Activity: Not on file  Stress: Not on file  Social Connections: Moderately Integrated (08/22/2023)   Social Connection and Isolation Panel    Frequency of Communication with Friends and Family: Twice a week    Frequency of Social Gatherings with Friends and Family: Once a week    Attends Religious Services: More than 4 times per year    Active Member of [employer] or Organizations: No    Attends Banker Meetings: Never    Marital Status: Married    Review of Systems: A 12 point ROS discussed and pertinent positives are indicated in the HPI above.  All other systems are negative.  Review of Systems  Constitutional:  Negative for activity change, fatigue and fever.  HENT:  Negative for sore throat, tinnitus and trouble swallowing.   Eyes:  Positive for visual disturbance.  Respiratory:  Negative for cough and shortness of breath.   Cardiovascular:  Negative for chest pain.  Gastrointestinal:  Negative for abdominal pain, nausea and vomiting.  Musculoskeletal:  Negative for back pain and gait problem.  Neurological:  Negative for  dizziness, tremors, seizures, syncope, facial asymmetry, speech difficulty, weakness, light-headedness, numbness and headaches.  Psychiatric/Behavioral:  Negative for behavioral problems and confusion.     Vital Signs: BP (!) 151/88   Pulse 61   Temp 97.9 F (36.6 C) (Oral)   Resp 18   Ht 5' 8 (1.727 m)   Wt 123 lb 3.8 oz (55.9 kg)   SpO2 100%   BMI 18.74 kg/m   Advance Care Plan: The advanced care plan/surrogate decision maker was discussed at the time of visit and documented in the medical record.    Physical Exam Vitals reviewed.  HENT:     Mouth/Throat:     Mouth: Mucous membranes are moist.   Eyes:     Extraocular Movements: Extraocular movements intact.    Cardiovascular:     Rate and Rhythm: Normal rate and regular rhythm.     Heart sounds: Normal heart sounds. No murmur heard. Pulmonary:     Effort: Pulmonary effort is normal.     Breath sounds: Normal breath sounds. No wheezing.  Abdominal:     Palpations: Abdomen is soft.     Tenderness: There is no abdominal tenderness.   Musculoskeletal:        General: No swelling. Normal range of motion.     Right lower leg: No edema.     Left lower leg: No edema.   Skin:    General: Skin is warm.   Neurological:     Mental Status: He is alert and oriented to person, place, and time.   Psychiatric:        Mood and Affect: Mood normal.        Behavior: Behavior normal.        Thought Content: Thought content normal.        Judgment: Judgment normal.     Imaging: DG Chest Port 1 View Result Date: 08/26/2023 CLINICAL DATA:  Shortness of breath. EXAM: PORTABLE CHEST 1 VIEW COMPARISON:  07/31/2023 FINDINGS: The lungs are clear without focal pneumonia, edema, pneumothorax or pleural effusion. Cardiopericardial silhouette is at upper limits of normal for size. Left-sided permanent pacemaker/AICD again noted. Telemetry leads overlie the chest. No acute bony abnormality. IMPRESSION: No active disease. Electronically  Signed   By: Lyell Samuel  Garon Kaiser M.D.   On: 08/26/2023 06:43   CT ANGIO HEAD NECK W WO CM W PERF (CODE STROKE) Result Date: 08/25/2023 CLINICAL DATA:  Initial evaluation for acute neuro deficit, stroke suspected, right-sided weakness. Aphasia. EXAM: CT ANGIOGRAPHY HEAD AND NECK CT PERFUSION BRAIN TECHNIQUE: Multidetector CT imaging of the head and neck was performed using the standard protocol during bolus administration of intravenous contrast. Multiplanar CT image reconstructions and MIPs were obtained to evaluate the vascular anatomy. Carotid stenosis measurements (when applicable) are obtained utilizing NASCET criteria, using the distal internal carotid diameter as the denominator. Multiphase CT imaging of the brain was performed following IV bolus contrast injection. Subsequent parametric perfusion maps were calculated using RAPID software. RADIATION DOSE REDUCTION: This exam was performed according to the departmental dose-optimization program which includes automated exposure control, adjustment of the mA and/or kV according to patient size and/or use of iterative reconstruction technique. CONTRAST:  OMNIPAQUE  IOHEXOL  350 MG/ML SOLN COMPARISON:  CT from earlier the same day as well as prior studies from 08/21/2023. FINDINGS: CTA NECK FINDINGS Aortic arch: Visualized aortic arch within normal limits for caliber standard 3 vessel morphology. Aortic atherosclerosis. No significant stenosis about the origin the great vessels. Right carotid system: Right common and internal carotid arteries are patent without dissection. Moderate calcified plaque about the right carotid bulb without hemodynamically significant greater than 50% stenosis. Minimal irregularity about the mid cervical right ICA noted, favored atherosclerotic in nature. Left carotid system: Left common carotid artery remains patent to the bifurcation without stenosis. Atheromatous change about the left carotid bifurcation without hemodynamically  significant stenosis. Previously seen left ICA occlusion is no longer seen, with interval revascularization of the left ICA in the neck. Left ICA remains patent the skull base without significant stenosis or evidence for dissection. Vertebral arteries: Both vertebral arteries arise from subclavian arteries. No proximal subclavian artery stenosis. Evaluation of vertebral artery somewhat limited by adjacent venous contamination. Tubal arteries M cells are patent without stenosis or dissection. Skeleton: No discrete or worrisome osseous lesions. Moderate cervical spondylosis. Other neck: No other acute finding. Upper chest: Streak artifact from a left-sided pacemaker/AICD. No other acute finding. Review of the MIP images confirms the above findings CTA HEAD FINDINGS Anterior circulation: Atheromatous change about the carotid siphons bilaterally. No significant stenosis on the right. The left ICA is now patent to the terminus. Focal filling defect within the cavernous left ICA at the level of the anterior genu consistent with small volume thrombus (series 5, image 133). This likely reflects a small amount of migrated clot from the neck below given prior left ICA occlusion. A1 segments patent bilaterally. Normal anterior communicating artery complex. Azygous ACA widely patent without stenosis. No M1 stenosis or occlusion. Right MCA branches patent and well perfused. On the left, there is a distal left M3 occlusion, likely embolic (a series 5, image 99). Remainder of the left MCA branches remain patent. Posterior circulation: Both V4 segments patent without stenosis. Both PICA patent at their origins. Basilar patent without stenosis. Superior cerebral arteries patent bilaterally. Atheromatous irregularity about the PCAs with associated mild to moderate bilateral P2 stenoses. PCAs remain patent to their distal aspects. Venous sinuses: Grossly patent allowing for timing the contrast bolus. Anatomic variants: Azygous ACA.   No aneurysm. Review of the MIP images confirms the above findings CT Brain Perfusion Findings: ASPECTS: 10 CBF (<30%) Volume: 0mL Perfusion (Tmax>6.0s) volume: 20mL Mismatch Volume: 20mL Infarction Location:Negative CT perfusion for acute core infarct. 20 mL area of delayed perfusion  at the mid-posterior left frontal lobe, in keeping with the distal left M3 occlusion. No other perfusion abnormality. IMPRESSION: 1. Interval revascularization of the left ICA in the neck, with the left ICA now patent to the terminus. Focal filling defect within the cavernous left ICA at the level of the anterior genu consistent with small volume thrombus, likely migrated clot from the neck below given prior left ICA occlusion. Secondary severe stenosis at this level. 2. Downstream acute distal left M3 occlusion, likely embolic. 3. Negative CT perfusion for acute core infarct. 20 mL area of delayed perfusion at the mid-posterior left frontal lobe, in keeping with the left M3 occlusion. 4. Atheromatous change about the carotid bifurcations and carotid siphons without hemodynamically significant stenosis. 5.  Aortic Atherosclerosis (ICD10-I70.0). Results were discussed by telephone at the time of interpretation on 08/25/2023 at 9:10 p.m. to provider [facility] , who verbally acknowledged these results. Electronically Signed   By: Virgia Griffins M.D.   On: 08/25/2023 21:55   CT HEAD CODE STROKE WO CONTRAST Result Date: 08/25/2023 CLINICAL DATA:  Code stroke. Initial evaluation for acute neuro deficit, stroke suspected. EXAM: CT HEAD WITHOUT CONTRAST TECHNIQUE: Contiguous axial images were obtained from the base of the skull through the vertex without intravenous contrast. RADIATION DOSE REDUCTION: This exam was performed according to the departmental dose-optimization program which includes automated exposure control, adjustment of the mA and/or kV according to patient size and/or use of iterative reconstruction technique.  COMPARISON:  Prior studies from 08/21/2023 FINDINGS: Brain: Mild age-related cerebral atrophy with chronic small vessel ischemic disease. Small remote right cerebellar infarct. No acute intracranial hemorrhage. No acute large vessel territory infarct. No mass lesion or midline shift. No hydrocephalus or extra-axial fluid collection. Vascular: No abnormal hyperdense vessel. Calcified atherosclerosis present at the skull base. Skull: Scalp soft tissues within normal limits.  Calvarium intact. Sinuses/Orbits: Globes orbital soft tissues within normal limits. Paranasal sinuses are clear. No mastoid effusion. Other: None. ASPECTS [facility] Stroke Program Early CT Score) - Ganglionic level infarction (caudate, lentiform nuclei, internal capsule, insula, M1-M3 cortex): 7 - Supraganglionic infarction (M4-M6 cortex): 3 Total score (0-10 with 10 being normal): 10 IMPRESSION: 1. No acute intracranial abnormality. 2. ASPECTS is 10. 3. 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 These results were communicated to Dr. Arora at 8:52 pm on 08/25/2023 by text page via the [facility] messaging system. Electronically Signed   By: Virgia Griffins M.D.   On: 08/25/2023 20:55   MR BRAIN WO CONTRAST Result Date: 08/23/2023 CLINICAL DATA:  Provided history: Vision changes. Headache. Dizziness. EXAM: MRI HEAD WITHOUT CONTRAST TECHNIQUE: Multiplanar, multiecho pulse sequences of the brain and surrounding structures were obtained without intravenous contrast. COMPARISON:  Non-contrast head CT, CT angiogram head/neck and CT perfusion head 08/21/2023. FINDINGS: Brain: Mild generalized cerebral atrophy. 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 Multifocal T2 FLAIR hyperintense signal abnormality within the cerebral white matter, nonspecific but compatible with moderate chronic small vessel ischemic disease. Mild chronic small  vessel ischemic changes within the pons. Small T2 hyperintense focus within the right caudate head likely reflecting a prominent perivascular space. Prominent perivascular space also present within the right basal ganglia inferiorly. 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 Nonspecific punctate chronic microhemorrhage within medial the right cerebellar hemisphere. No evidence of an intracranial mass. No extra-axial fluid collection. No midline shift. Vascular: Known occlusion of the intracranial left  internal carotid artery as described on the recent prior CTA head/neck of 08/21/2023. Skull and upper cervical spine: No focal worrisome marrow lesion. Sinuses/Orbits: No mass or acute finding within the imaged orbits. Prior but ocular lens replacement. No significant paranasal sinus disease. IMPRESSION: 1. 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 2. Known occlusion of the left internal carotid artery. 3. Chronic small vessel ischemic changes which are moderate in the cerebral white matter and mild in the pons. 4. Small chronic infarct within the right cerebellar hemisphere. 5. Mild generalized cerebral atrophy. Electronically Signed   By: Bascom Lily D.O.   On: 08/23/2023 13:22   CUP PACEART REMOTE DEVICE CHECK Result Date: 08/22/2023 ICD Scheduled remote reviewed. Normal device function.  Presenting rhythm: BIV paced HF diagnostics abnormal this monitoring period. BIV pacing at 91%; stable. No new events since 4/4 programmer session, prior to that 6 NSVT events, longest x 4 sec; V rates >200 bpm.  Likely ventricular driven. Next remote 91 days. [place], CV[facility]  ECHOCARDIOGRAM COMPLETE Result Date: 08/22/2023    ECHOCARDIOGRAM REPORT   Patient Name:   Ariyan Brisendine. Date of Exam: 08/22/2023 Medical Rec #:  [id number]            Height:       68.0 in Accession #:    [id number]           Weight:       123.4 lb Date of Birth:  [birth year]             BSA:          1.665 m Patient Age:    77 years             BP:           142/87 mmHg Patient Gender: M                    HR:           83 bpm. Exam Location:  [facility] Procedure: 2D Echo, Cardiac Doppler, Color Doppler and Strain Analysis (Both            Spectral and Color Flow Doppler were utilized during procedure). Indications:    stroke I63.9  History:        Patient has prior history of Echocardiogram examinations, most                 recent 01/29/2023. CHF, CAD; Risk Factors:Hypertension and                 Dyslipidemia.  Sonographer:    Kip Peon RDCS Referring Phys: [id number] Arnie Lao [facility]  Sonographer Comments: Global longitudinal strain was attempted. IMPRESSIONS  1. Left ventricular ejection fraction, by estimation, is 35 to 40%. The left ventricle has moderately decreased function. The left ventricle demonstrates regional wall motion abnormalities (see scoring diagram/findings for description). There is mild concentric left ventricular hypertrophy. Left ventricular diastolic parameters are indeterminate.  2. Right ventricular systolic function is normal. The right ventricular size is normal. There is mildly elevated pulmonary artery systolic pressure. The estimated right ventricular systolic pressure is 38.0 mmHg.  3. Left atrial size was moderately dilated.  4. The mitral valve is degenerative with mild posterior leaflet prolapse. Moderate to severe mitral valve regurgitation.  5. Tricuspid valve regurgitation is mild to moderate.  6. The aortic valve is tricuspid. There is mild calcification of the aortic valve. Aortic valve regurgitation  is trivial. No aortic stenosis is present.  7. The inferior vena cava is normal in size with greater than 50% respiratory variability, suggesting right atrial pressure of 3 mmHg.  8. Evidence of probable atrial level shunting detected by color flow Doppler, left to right at mid interatrial septum. Suggest agitated saline study. Comparison(s): Prior  images reviewed side by side. LVEF 35-40% with stable WMA and septal motion consistent with RV pacing. Mild posterior leaflet mitral prolapse with moderate to severe mtiral regurgitation. 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 FINDINGS  Left Ventricle: Left ventricular ejection fraction, by estimation, is 35 to 40%. The left ventricle has moderately decreased function. The left ventricle demonstrates regional wall motion abnormalities. The left ventricular internal cavity size was normal in size. There is mild concentric left ventricular hypertrophy. Abnormal (paradoxical) septal motion, consistent with RV pacemaker. Left ventricular diastolic parameters are indeterminate.  LV Wall Scoring: The entire inferior wall, posterior wall, and basal anterolateral segment are hypokinetic. The entire anterior wall, entire septum, apical lateral segment, mid anterolateral segment, and apex are normal. Right Ventricle: The right ventricular size is normal. No increase in right ventricular wall thickness. Right ventricular systolic function is normal. There is mildly elevated pulmonary artery systolic pressure. The tricuspid regurgitant velocity is 2.96  m/s, and with an assumed right atrial pressure of 3 mmHg, the estimated right ventricular systolic pressure is 38.0 mmHg. Left Atrium: Left atrial size was moderately dilated. Right Atrium: Right atrial size was normal in size. Pericardium: There is no evidence of pericardial effusion. Mitral Valve: The mitral valve is degenerative in appearance. There is mild thickening of the mitral valve leaflet(s). Moderate to severe mitral valve regurgitation, with posteriorly-directed jet. Tricuspid Valve: The tricuspid valve is grossly normal. Tricuspid valve regurgitation is mild to moderate. Aortic Valve: The aortic valve is tricuspid. There is mild calcification of the aortic valve. There is mild aortic valve annular calcification. Aortic valve  regurgitation is trivial. No aortic stenosis is present. Pulmonic Valve: The pulmonic valve was grossly normal. Pulmonic valve regurgitation is trivial. Aorta: The aortic root and ascending aorta are structurally normal, with no evidence of dilitation. Venous: The inferior vena cava is normal in size with greater than 50% respiratory variability, suggesting right atrial pressure of 3 mmHg. IAS/Shunts: Evidence of atrial level shunting detected by color flow Doppler. Additional Comments: 3D was performed not requiring image post processing on an independent workstation and was indeterminate. A device lead is visualized.  LEFT VENTRICLE PLAX 2D LVIDd:         5.10 cm LVIDs:         4.40 cm LV PW:         1.40 cm 3D Volume EF LV IVS:        1.00 cm LV 3D EDV:   123.82 ml                        LV 3D ESV:   48.23 ml RIGHT VENTRICLE          IVC RV Basal diam:  3.70 cm  IVC diam: 1.60 cm LEFT ATRIUM             Index        RIGHT ATRIUM           Index LA diam:        4.10 cm 2.46 cm/m   RA Area:     19.90 cm LA Vol (  A2C):   58.0 ml 34.84 ml/m  RA Volume:   50.80 ml  30.52 ml/m LA Vol (A4C):   84.5 ml 50.76 ml/m LA Biplane Vol: 73.4 ml 44.10 ml/m  AORTIC VALVE LVOT Vmax:   75.95 cm/s LVOT Vmean:  46.700 cm/s LVOT VTI:    0.125 m  AORTA Ao Asc diam: 3.20 cm MITRAL VALVE               TRICUSPID VALVE MV Area (PHT): 3.12 cm    TR Peak grad:   35.0 mmHg MV Decel Time: 243 msec    TR Vmax:        296.00 cm/s MR Peak grad: 87.0 mmHg MR Mean grad: 62.0 mmHg    SHUNTS MR Vmax:      466.33 cm/s  Systemic VTI: 0.12 m MR Vmean:     367.0 cm/s MV E velocity: 70.60 cm/s MV A velocity: 30.70 cm/s MV E/A ratio:  2.30 Teddie Favre MD Electronically signed by Teddie Favre MD Signature Date/Time: 08/22/2023/11:28:01 AM    Final    CT CEREBRAL PERFUSION W CONTRAST Result Date: 08/21/2023 CLINICAL DATA:  Initial evaluation for acute visual disturbance. EXAM: CT PERFUSION BRAIN TECHNIQUE: Multiphase CT imaging of the brain was  performed following IV bolus contrast injection. Subsequent parametric perfusion maps were calculated using RAPID software. RADIATION DOSE REDUCTION: This exam was performed according to the departmental dose-optimization program which includes automated exposure control, adjustment of the mA and/or kV according to patient size and/or use of iterative reconstruction technique. CONTRAST:  32mL OMNIPAQUE  IOHEXOL  350 MG/ML SOLN COMPARISON:  Prior CTs from earlier the same day. FINDINGS: CT Brain Perfusion Findings: CBF (<30%) Volume: 0mL Perfusion (Tmax>6.0s) volume: 0mL Mismatch Volume: 0mL ASPECTS on noncontrast CT Head: 10 at 5:43 p.m. today. Infarct Core: 0 mL Infarction Location:Negative CT perfusion for acute ischemia. Mildly delayed perfusion of T-max greater than 4 seconds in the watershed zones of the left cerebral hemispheres compared to the right, presumably related to the occluded left ICA as seen on prior CTA. IMPRESSION: 1. Negative CT perfusion for acute ischemia. 2. Mildly delayed perfusion of T-max greater than 4 seconds in the watershed zones of the left cerebral hemisphere compared to the right, presumably related to the occluded left ICA as seen on prior CTA. Electronically Signed   By: Virgia Griffins M.D.   On: 08/21/2023 21:04   CT ANGIO HEAD NECK W WO CM Addendum Date: 08/21/2023 ADDENDUM REPORT: 08/21/2023 19:32 ADDENDUM: CTA neck impression #1 and CTA head impression #1 called by telephone on 08/21/2023 at 7:14 pm to provider Dr. Alecia Ames, who verbally acknowledged these results. Electronically Signed   By: Bascom Lily D.O.   On: 08/21/2023 19:32   Result Date: 08/21/2023 CLINICAL DATA:  Provided history: Neuro deficit, acute, stroke suspected. Left-sided headache, vision changes, dizziness. EXAM: CT ANGIOGRAPHY HEAD AND NECK WITH AND WITHOUT CONTRAST TECHNIQUE: Multidetector CT imaging of the head and neck was performed using the standard protocol during bolus administration of  intravenous contrast. Multiplanar CT image reconstructions and MIPs were obtained to evaluate the vascular anatomy. Carotid stenosis measurements (when applicable) are obtained utilizing NASCET criteria, using the distal internal carotid diameter as the denominator. RADIATION DOSE REDUCTION: This exam was performed according to the departmental dose-optimization program which includes automated exposure control, adjustment of the mA and/or kV according to patient size and/or use of iterative reconstruction technique. CONTRAST:  60mL OMNIPAQUE  IOHEXOL  350 MG/ML SOLN COMPARISON:  Non-contrast head CT performed earlier today 08/21/2023.  FINDINGS: CTA NECK FINDINGS Aortic arch: Standard aortic branching. Atherosclerotic plaque within the visualized aortic arch and proximal major branch vessels of the neck. No hemodynamically significant innominate or proximal subclavian artery stenosis. Right carotid system: CCA and ICA patent within the neck. Atherosclerotic plaque about the carotid bifurcation, within the proximal ICA and within the proximal ECA. Estimated 50% stenosis at the ICA origin. Severe stenosis of the proximal ECA. Left carotid system: The common carotid artery is patent within the neck. The internal carotid artery becomes occluded shortly beyond its origin and remains occluded throughout the remainder of the neck. Calcified atherosclerotic plaque at the ECA origin resulting in at least moderate stenosis. Vertebral arteries: Codominant and patent within the neck. Scattered atherosclerotic plaque within the cervical left vertebral artery. Most notably, calcified plaque within the proximal V3 segment results in up to moderate stenosis. Skeleton: Dextrocurvature of the cervical spine. Cervical spondylosis. No acute fracture or aggressive osseous lesion. Other neck: No neck mass or cervical lymphadenopathy. Upper chest: No consolidation within the imaged lung apices. Review of the MIP images confirms the above  findings CTA HEAD FINDINGS Anterior circulation: The intracranial left internal carotid artery remains occluded to the paraclinoid level. Reconstitution of enhancement within the supraclinoid left ICA. The intracranial right internal carotid artery is patent. Calcified atherosclerotic plaque within this vessel with no more than mild stenosis. The M1 middle cerebral arteries are patent. Atherosclerotic irregularity of the M2 and more distal MCA branches. No M2 proximal branch occlusion or high-grade proximal stenosis. The anterior cerebral arteries are patent. Azygous configuration of the anterior cerebral arteries. Mildly hypoplastic left A1 segment. No intracranial aneurysm is identified. Posterior circulation: The intracranial vertebral arteries are patent. The basilar artery is patent. The posterior cerebral arteries are patent. Atherosclerotic irregularity of both vessels. Most notably, there is a moderate/severe stenosis within the left PCA P2 segment. A left posterior communicating artery is present. The right posterior communicating artery is diminutive or absent. Venous sinuses: Within the limitations of contrast timing, no convincing thrombus. Anatomic variants: As described grade Review of the MIP images confirms the above findings Attempts are being made to reach the ordering provider at this time. IMPRESSION: CTA neck: 1. 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 This is age-indeterminate. 2. The right common and internal carotid arteries are patent within the neck. Estimated 50% atherosclerotic narrowing at the right ICA origin. 3. Vertebral arteries patent within the neck. Up to moderate atherosclerotic narrowing of the left vertebral artery V3 segment. 4. Aortic Atherosclerosis (ICD10-I70.0). CTA head: 1. 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 This is  age-indeterminate. Reconstitution of enhancement within the supraclinoid left ICA. 2. No proximal intracranial large vessel occlusion identified elsewhere. 3. Background intracranial atherosclerotic disease as described. Most notably, there is a moderate/severe stenosis within the left PCA P2 segment. Electronically Signed: By: Bascom Lily D.O. On: 08/21/2023 19:04   CT HEAD CODE STROKE WO CONTRAST Result Date: 08/21/2023 CLINICAL DATA:  Code stroke. Neuro deficit, acute, stroke suspected. Dizziness. Eye pain on the left, headache, nausea. EXAM: CT HEAD WITHOUT CONTRAST TECHNIQUE: Contiguous axial images were obtained from the base of the skull through the vertex without intravenous contrast. RADIATION DOSE REDUCTION: This exam was performed according to the departmental dose-optimization program which includes automated exposure control, adjustment of the mA and/or kV according to patient size and/or use of iterative reconstruction technique. COMPARISON:  Head  CT 08/08/2023. FINDINGS: Brain: Generalized cerebral atrophy. Prominent perivascular spaces within the right basal ganglia inferiorly. Small chronic infarct again demonstrated within the inferior right cerebellar hemisphere. There is no acute intracranial hemorrhage. No demarcated cortical infarct. No extra-axial fluid collection. No evidence of an intracranial mass. No midline shift. Vascular: No hyperdense vessel. Atherosclerotic calcifications. Skull: No calvarial fracture or aggressive osseous lesion. Sinuses/Orbits: No mass or acute finding within the imaged orbits. No significant paranasal sinus disease at the imaged levels. ASPECTS [facility] Stroke Program Early CT Score) - Ganglionic level infarction (caudate, lentiform nuclei, internal capsule, insula, M1-M3 cortex): 7 - Supraganglionic infarction (M4-M6 cortex): 3 Total score (0-10 with 10 being normal): 10 No evidence of an acute intracranial abnormality. These results were communicated to Dr.  Alecia Ames at 5:55 pmon 6/10/2025by text page via the [facility] messaging system. IMPRESSION: 1.  No evidence of an acute intracranial abnormality. 2. 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 3. Cerebral atrophy. Electronically Signed   By: Bascom Lily D.O.   On: 08/21/2023 17:56   CT Cervical Spine Wo Contrast Result Date: 08/08/2023 CLINICAL DATA:  Neck trauma (Age >= 65y) EXAM: CT CERVICAL SPINE WITHOUT CONTRAST TECHNIQUE: Multidetector CT imaging of the cervical spine was performed without intravenous contrast. Multiplanar CT image reconstructions were also generated. RADIATION DOSE REDUCTION: This exam was performed according to the departmental dose-optimization program which includes automated exposure control, adjustment of the mA and/or kV according to patient size and/or use of iterative reconstruction technique. COMPARISON:  None Available. FINDINGS: Alignment: Straightening of normal lordosis. No traumatic subluxation. Skull base and vertebrae: No acute fracture. Vertebral body heights are maintained. Schmorl's node within inferior C7. The dens and skull base are intact. Soft tissues and spinal canal: No prevertebral fluid or swelling. No visible canal hematoma. Disc levels: Disc space narrowing and anterior spurring C4-C5, C5-C6, C6-C7 and C7-T1. Upper chest: No acute findings. Biapical pleuroparenchymal scarring. Other: Carotid calcifications. IMPRESSION: 1. No acute fracture or subluxation of the cervical spine. 2. Multilevel degenerative disc disease. Electronically Signed   By: Chadwick Colonel M.D.   On: 08/08/2023 17:33   DG Elbow 2 Views Left Result Date: 08/08/2023 CLINICAL DATA:  Pain after fall. EXAM: LEFT ELBOW - 2 VIEW COMPARISON:  None Available. FINDINGS: There is no evidence of fracture or dislocation. Right elevated anterior fat pad but no posterior fat pad elevation. There is no evidence of arthropathy or other focal bone abnormality. Soft  tissues are unremarkable. IMPRESSION: 1. Possible small joint effusion. 2. No evidence of fracture or dislocation. If symptoms persist, recommend follow-up radiographs in 7-10 days. Electronically Signed   By: Chadwick Colonel M.D.   On: 08/08/2023 17:26   CT Head Wo Contrast Result Date: 08/08/2023 CLINICAL DATA:  Head trauma, minor (Age >= 65y) EXAM: CT HEAD WITHOUT CONTRAST TECHNIQUE: Contiguous axial images were obtained from the base of the skull through the vertex without intravenous contrast. RADIATION DOSE REDUCTION: This exam was performed according to the departmental dose-optimization program which includes automated exposure control, adjustment of the mA and/or kV according to patient size and/or use of iterative reconstruction technique. COMPARISON:  None Available. FINDINGS: Brain: No intracranial hemorrhage, mass effect, or midline shift. Generalized atrophy. No hydrocephalus. The basilar cisterns are patent. Perivascular space versus remote lacunar infarct in the right basal ganglia. Remote right cerebellar lacunar infarct. No evidence of territorial infarct or acute ischemia. No extra-axial or intracranial fluid collection. Vascular: Atherosclerosis of skullbase vasculature without hyperdense vessel or abnormal  calcification. Skull: No fracture or focal lesion. Sinuses/Orbits: Paranasal sinuses and mastoid air cells are clear. The visualized orbits are unremarkable. Other: No confluent scalp hematoma. IMPRESSION: 1. No acute intracranial abnormality. No skull fracture. 2. Generalized atrophy. Remote right cerebellar lacunar infarct. Electronically Signed   By: Chadwick Colonel M.D.   On: 08/08/2023 17:24   DG Chest 2 View Result Date: 07/31/2023 CLINICAL DATA:  Shortness of breath. EXAM: CHEST - 2 VIEW COMPARISON:  02/02/2016. FINDINGS: Bilateral lung fields are clear. Bilateral costophrenic angles are clear. Normal cardio-mediastinal silhouette. There is a left sided 3-lead pacemaker. No  acute osseous abnormalities. The soft tissues are within normal limits. IMPRESSION: *No active cardiopulmonary disease. Electronically Signed   By: [name] M.D.   On: 07/31/2023 14:19    Labs:  CBC: Recent Labs    08/24/23 0647 08/25/23 1608 08/26/23 1134 08/27/23 0534  WBC 4.8 8.5 8.1 6.6  HGB 12.5* 13.8 12.2* 12.1*  HCT 37.5* 40.7 36.6* 35.0*  PLT 222 197 245 237    COAGS: Recent Labs    08/01/23 0402 08/24/23 0647 08/24/23 1837 08/25/23 0644  INR 1.1  --   --   --   APTT 24 54* 59* 110*    BMP: Recent Labs    08/23/23 0736 08/25/23 0644 08/26/23 1134 08/27/23 0534  NA 139 138 135 138  K 3.7 3.3* 3.7 3.7  CL 111 107 106 111  CO2 17* 19* 17* 19*  GLUCOSE 88 96 91 97  BUN 21 23 24* 25*  CALCIUM  8.6* 9.0 8.8* 8.7*  CREATININE 1.83* 2.24* 2.00* 2.14*  GFRNONAA 38* 30* 34* 31*    LIVER FUNCTION TESTS: Recent Labs    08/01/23 0402 08/08/23 1654 08/21/23 1722  BILITOT 1.2 1.0 1.3*  AST 21 22 37  ALT 32 30 33  ALKPHOS 42 49 57  PROT 6.0* 6.1* 6.4*  ALBUMIN  3.3* 3.3* 3.5    TUMOR MARKERS: No results for input(s): AFPTM, CEA, CA199, CHROMGRNA in the last 8760 hours.  Assessment and Plan:  Symptomatic Left internal carotid stenosis Scheduled for Cerebral arteriogram with possible treatment in IR with Dr Alvira Josephs Risks and benefits of cerebral angiogram with intervention were discussed with the patient including, but not limited to bleeding, infection, vascular injury, contrast induced renal failure, stroke or even death.  This interventional procedure involves the use of X-rays and because of the nature of the planned procedure, it is possible that we will have prolonged use of X-ray fluoroscopy.  Potential radiation risks to you include (but are not limited to) the following: - A slightly elevated risk for cancer  several years later in life. This risk is typically less than 0.5% percent. This risk is low in comparison to the normal  incidence of human cancer, which is 33% for women and 50% for men according to the [employer]. - Radiation induced injury can include skin redness, resembling a rash, tissue breakdown / ulcers and hair loss (which can be temporary or permanent).   The likelihood of either of these occurring depends on the difficulty of the procedure and whether you are sensitive to radiation due to previous procedures, disease, or genetic conditions.   IF your procedure requires a prolonged use of radiation, you will be notified and given written instructions for further action.  It is your responsibility to monitor the irradiated area for the 2 weeks following the procedure and to notify your physician if you are concerned that you have suffered a  radiation induced injury.    All of the patient's questions were answered, patient is agreeable to proceed.  Consent signed and in chart.    Thank you for this interesting consult.  I greatly enjoyed meeting Brent Ray. and look forward to participating in their care.  A copy of this report was sent to the requesting provider on this date.  Electronically Signed: Ellen Guppy, PA-C 08/27/2023, 7:41 AM   I spent a total of 20 Minutes    in face to face in clinical consultation, greater than 50% of which was counseling/coordinating care for Cerebral arteriogram with possible intervention

## 2023-08-27 NOTE — Procedures (Signed)
 INR. Status post L four-vessel cerebral arteriogram.  Right CFA approach.  Findings.  1. Interval complete revascularization of the left internal carotid artery extracranially and intracranially.  No evidence of gross filling defects noted in the internal carotid artery.  2.  Mild to moderate FMD-like changes of the mid cervical ICAs bilaterally without significant stenosis.  3.  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  8 French Angio-Seal closure device deployed for hemostasis at the right groin puncture site.  Distal pulses present unchanged from prior to the procedure.  Patient extubated.  Denies any headaches nausea vomiting.  Pupils 2 to  mm sluggishly reactive bilaterally.  Minimal facial asymmetry.  Tongue midline.  Following commands appropriately.  Moves both arms and legs equally.  S.Abram Sax MD

## 2023-08-27 NOTE — Progress Notes (Signed)
 PROGRESS NOTE        PATIENT DETAILS Name: Brent Ray. Age: 78 y.o. Sex: male Date of Birth: 04-Oct-1945 Admit Date: 08/21/2023 Admitting Physician Cala Castleman, MD ZOX:[id number], Boone Buzzard, MD  Brief Summary: Patient is a 78 y.o.  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   Significant events: 6/10>> admit to [facility] at [facility] 6/11>> transferred to [facility]  Significant studies: 6/10>> CT head: No acute intracranial abnormality. 6/10>> CTA head/neck: Left ICA occluded shortly beyond its origin- to the left paraclinoid level, 50% narrowing of right ICA origin.  Moderate to severe stenosis within the left PCA P2 segment. 6/10>> CT cerebral perfusion study: Negative for acute ischemia. 6/10>> A1c: 5.6 6/10>> UDS: Negative 6/11>> LDL: 54 6/11>> TTE: EF 35-40,+VE regional wall motion, moderate to severe mitral regurgitation  Significant microbiology data: None  Procedures:  On 08/27/23 - Cerebral arteriogram by Dr. Alvira Josephs on 08/27/2023   Findings.   1. Interval complete revascularization of the left internal carotid artery extracranially and intracranially.  No evidence of gross filling defects noted in the internal carotid artery.   2.  Mild to moderate FMD-like changes of the mid cervical ICAs bilaterally without significant stenosis.   3.  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   8 French Angio-Seal closure device deployed for hemostasis at the right groin puncture site.  Distal pulses present unchanged from prior to  the procedure.    Consults: Neurology IR PCCM  Subjective: Patient in bed, appears comfortable, denies any headache, no fever, no chest pain or pressure, no shortness of breath , no abdominal pain. No new focal weakness.   Objective: Vitals: Blood pressure 120/66, pulse 71, temperature 97.7 F (36.5 C), resp. rate 10, height 5' 8 (1.727 m), weight 55.9 kg, SpO2 98%.   Exam:  Awake Alert, No new F.N deficits, Normal affect [place].AT,PERRAL Supple Neck, No JVD,   Symmetrical Chest wall movement, Good air movement bilaterally, CTAB RRR,No Gallops, Rubs or new Murmurs,  +ve B.Sounds, Abd Soft, No tenderness,   No Cyanosis, Clubbing or edema    Assessment/Plan:  Left visual field cut/dizziness/headache Concern for small stroke not seen on CT imaging-probably thromboembolic given left ICA occlusion Thankfully left visual scotoma/dizziness and headache have all resolved MRI +ve for acute CVA-Dr Christiane Cowing has d/w Neuro int radiology-recommenations are to proceed with a inpatient cerebral angiogram to fully evaluate ICA-as patient had a acute cva while on anticoagulation.  Currently on combination of heparin  drip, aspirin  and Brilinta.  Night of 08/25/2023 he had transient aphasia, seen by neuro underwent CTA showing Focal filling defect within the cavernous left ICA at the level of the anterior genu consistent with small volume thrombus, likely migrated clot from the neck below given prior left ICA occlusion. Secondary severe stenosis at this level.Downstream acute distal left M3 occlusion, likely embolic. His symptoms have resolved for now no changes made by neurology continue to monitor with supportive care.  He is already on max treatment.  He is s/p cerebral angiogram on 08/27/2023 which was unremarkable according to IR, case discussed with IR and neurology, he will be on 4 N. neuro ICU for 24 hours, IR recommended to start aspirin  81 mg p.o. daily and Brilinta 90 mg p.o. twice daily along with  full anticoagulation due to underlying A-fib, currently on heparin  drip eventually can be on Xarelto  if he stays stable.  Neurologist Dr. Janett Medin will update his final regimen later today.    Chronic HFrEF Euvolemic Continue furosemide , changed to as needed due to upcoming IV dye load Per prior notes-GDMT limited due to intolerance to multiple medications including beta-blocker/Aldactone /digoxin/SGL 2 inhibitors.  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  Rheumatoid arthritis On methotrexate  at home currently on hold  Code status:   Code Status: Full Code   DVT Prophylaxis:Heparin  gtt    Family Communication: Spouse was at bedside morning of 08/27/2023   Disposition Plan: Status is: Observation The patient will require care spanning > 2 midnights and should be moved to inpatient because: Severity of illness   Planned Discharge Destination:Home   Diet: Diet Order             Diet Heart Room service appropriate? Yes; Fluid consistency: Thin  Diet effective now                     Antimicrobial agents: Anti-infectives (From admission, onward)    None        Data Review:   Inpatient Medications  Scheduled Meds:  [MAR Hold]   stroke: early stages of recovery book   Does not apply Once   [MAR Hold] aspirin  EC  81 mg Oral Daily   [MAR Hold] fluorescein   1 strip Both Eyes Once   [MAR Hold] leptospermum manuka honey  1 Application Topical Daily   [MAR Hold] mexiletine  150 mg Oral BID   [MAR Hold] pantoprazole   40 mg Oral QPM   [MAR Hold] rosuvastatin   5 mg Oral Q Sun   [MAR Hold] sodium chloride  flush  3-10 mL Intravenous Q12H   [MAR Hold] tetracaine   1 drop Both Eyes Once   [MAR Hold] ticagrelor  90 mg Oral BID   Continuous Infusions:  sodium chloride      clevidipine     heparin  Stopped (08/27/23 0816)   PRN Meds:.[MAR Hold] acetaminophen  **OR** [MAR Hold] acetaminophen  (TYLENOL ) oral liquid 160 mg/5 mL **OR** [MAR Hold] acetaminophen , fentaNYL  (SUBLIMAZE ) injection, [MAR Hold] hydrALAZINE , midazolam , oxyCODONE **OR** oxyCODONE, [MAR Hold] senna-docusate  DVT Prophylaxis  Hep gtt   Recent Labs  Lab 08/21/23 1722 08/24/23 0647 08/25/23 1608 08/26/23 1134 08/27/23 0534  WBC 7.0 4.8 8.5 8.1 6.6  HGB 12.4* 12.5* 13.8 12.2* 12.1*  HCT 36.5* 37.5* 40.7 36.6* 35.0*  PLT 264 222 197 245 237  MCV 101.1* 98.4 98.8 100.0 99.4  MCH 34.3* 32.8 33.5  33.3 34.4*  MCHC 34.0 33.3 33.9 33.3 34.6  RDW 14.9 14.7 15.0 15.4 15.5  LYMPHSABS 0.9  --   --   --   --   MONOABS 0.2  --   --   --   --   EOSABS 0.1  --   --   --   --   BASOSABS 0.1  --   --   --   --     Recent Labs  Lab 08/21/23 1722 08/23/23 0736 08/25/23 0644 08/26/23 1134 08/27/23 0534 08/27/23 0847  NA 140 139 138 135 138  --   K 3.4* 3.7 3.3* 3.7 3.7  --   CL 114* 111 107 106 111  --   CO2 15* 17* 19* 17* 19*  --   ANIONGAP 11 11 12 12 8   --   GLUCOSE 128* 88 96 91 97  --   BUN 32* 21 23 24* 25*  --   CREATININE 2.12* 1.83* 2.24* 2.00* 2.14*  --   AST 37  --   --   --   --   --   ALT 33  --   --   --   --   --   ALKPHOS 57  --   --   --   --   --   BILITOT 1.3*  --   --   --   --   --   ALBUMIN  3.5  --   --   --   --   --   INR  --    --   --   --   --  1.1  HGBA1C 5.6  --   --   --   --   --   MG  --  1.9 1.9 1.8 2.0  --   PHOS  --   --  3.3 2.6 3.0  --   CALCIUM  8.8* 8.6* 9.0 8.8* 8.7*  --       Recent Labs  Lab 08/21/23 1722 08/23/23 0736 08/25/23 0644 08/26/23 1134 08/27/23 0534 08/27/23 0847  INR  --   --   --   --   --  1.1  HGBA1C 5.6  --   --   --   --   --   MG  --  1.9 1.9 1.8 2.0  --   CALCIUM  8.8* 8.6* 9.0 8.8* 8.7*  --     --------------------------------------------------------------------------------------------------------------- Lab Results  Component Value Date   CHOL 125 08/22/2023   HDL 62 08/22/2023   LDLCALC 54 08/22/2023   TRIG 43 08/22/2023   CHOLHDL 2.0 08/22/2023    Lab Results  Component Value Date   HGBA1C 5.6 08/21/2023   No results for input(s): TSH, T4TOTAL, FREET4, T3FREE, THYROIDAB in the last 72 hours. No results for input(s): VITAMINB12, FOLATE, FERRITIN, TIBC, IRON, RETICCTPCT in the last 72 hours. ------------------------------------------------------------------------------------------------------------------ Cardiac Enzymes No results for input(s): CKMB, TROPONINI, MYOGLOBIN in the last 168 hours.  Invalid input(s): CK  Micro Results No results found for this or any previous visit (from the past 240 hours).  Radiology Reports  DG Chest Port 1 View Result Date: 08/26/2023 CLINICAL DATA:  Shortness of breath. EXAM: PORTABLE CHEST 1 VIEW COMPARISON:  07/31/2023 FINDINGS: The lungs are clear without focal pneumonia, edema, pneumothorax or pleural effusion. Cardiopericardial silhouette is at upper limits of normal for size. Left-sided permanent pacemaker/AICD again noted. Telemetry leads overlie the chest. No acute bony abnormality. IMPRESSION: No active disease.  Electronically Signed   By: Donnal Fusi M.D.   On: 08/26/2023 06:43   CT ANGIO HEAD NECK W WO CM W PERF (CODE STROKE) Result Date: 08/25/2023 CLINICAL DATA:  Initial  evaluation for acute neuro deficit, stroke suspected, right-sided weakness. Aphasia. EXAM: CT ANGIOGRAPHY HEAD AND NECK CT PERFUSION BRAIN TECHNIQUE: Multidetector CT imaging of the head and neck was performed using the standard protocol during bolus administration of intravenous contrast. Multiplanar CT image reconstructions and MIPs were obtained to evaluate the vascular anatomy. Carotid stenosis measurements (when applicable) are obtained utilizing NASCET criteria, using the distal internal carotid diameter as the denominator. Multiphase CT imaging of the brain was performed following IV bolus contrast injection. Subsequent parametric perfusion maps were calculated using RAPID software. RADIATION DOSE REDUCTION: This exam was performed according to the departmental dose-optimization program which includes automated exposure control, adjustment of the mA and/or kV according to patient size and/or use of iterative reconstruction technique. CONTRAST:  100mL OMNIPAQUE  IOHEXOL  350 MG/ML SOLN COMPARISON:  CT from earlier the same day as well as prior studies from 08/21/2023. FINDINGS: CTA NECK FINDINGS Aortic arch: Visualized aortic arch within normal limits for caliber standard 3 vessel morphology. Aortic atherosclerosis. No significant stenosis about the origin the great vessels. Right carotid system: Right common and internal carotid arteries are patent without dissection. Moderate calcified plaque about the right carotid bulb without hemodynamically significant greater than 50% stenosis. Minimal irregularity about the mid cervical right ICA noted, favored atherosclerotic in nature. Left carotid system: Left common carotid artery remains patent to the bifurcation without stenosis. Atheromatous change about the left carotid bifurcation without hemodynamically significant stenosis. Previously seen left ICA occlusion is no longer seen, with interval revascularization of the left ICA in the neck. Left ICA remains patent  the skull base without significant stenosis or evidence for dissection. Vertebral arteries: Both vertebral arteries arise from subclavian arteries. No proximal subclavian artery stenosis. Evaluation of vertebral artery somewhat limited by adjacent venous contamination. Tubal arteries M cells are patent without stenosis or dissection. Skeleton: No discrete or worrisome osseous lesions. Moderate cervical spondylosis. Other neck: No other acute finding. Upper chest: Streak artifact from a left-sided pacemaker/AICD. No other acute finding. Review of the MIP images confirms the above findings CTA HEAD FINDINGS Anterior circulation: Atheromatous change about the carotid siphons bilaterally. No significant stenosis on the right. The left ICA is now patent to the terminus. Focal filling defect within the cavernous left ICA at the level of the anterior genu consistent with small volume thrombus (series 5, image 133). This likely reflects a small amount of migrated clot from the neck below given prior left ICA occlusion. A1 segments patent bilaterally. Normal anterior communicating artery complex. Azygous ACA widely patent without stenosis. No M1 stenosis or occlusion. Right MCA branches patent and well perfused. On the left, there is a distal left M3 occlusion, likely embolic (a series 5, image 99). Remainder of the left MCA branches remain patent. Posterior circulation: Both V4 segments patent without stenosis. Both PICA patent at their origins. Basilar patent without stenosis. Superior cerebral arteries patent bilaterally. Atheromatous irregularity about the PCAs with associated mild to moderate bilateral P2 stenoses. PCAs remain patent to their distal aspects. Venous sinuses: Grossly patent allowing for timing the contrast bolus. Anatomic variants: Azygous ACA.  No aneurysm. Review of the MIP images confirms the above findings CT Brain Perfusion Findings: ASPECTS: 10 CBF (<30%) Volume: 0mL Perfusion (Tmax>6.0s) volume:  20mL Mismatch Volume: 20mL Infarction Location:Negative CT perfusion for acute  core infarct. 20 mL area of delayed perfusion at the mid-posterior left frontal lobe, in keeping with the distal left M3 occlusion. No other perfusion abnormality. IMPRESSION: 1. Interval revascularization of the left ICA in the neck, with the left ICA now patent to the terminus. Focal filling defect within the cavernous left ICA at the level of the anterior genu consistent with small volume thrombus, likely migrated clot from the neck below given prior left ICA occlusion. Secondary severe stenosis at this level. 2. Downstream acute distal left M3 occlusion, likely embolic. 3. Negative CT perfusion for acute core infarct. 20 mL area of delayed perfusion at the mid-posterior left frontal lobe, in keeping with the left M3 occlusion. 4. Atheromatous change about the carotid bifurcations and carotid siphons without hemodynamically significant stenosis. 5.  Aortic Atherosclerosis (ICD10-I70.0). Results were discussed by telephone at the time of interpretation on 08/25/2023 at 9:10 p.m. to provider [facility] , who verbally acknowledged these results. Electronically Signed   By: Virgia Griffins M.D.   On: 08/25/2023 21:55   CT HEAD CODE STROKE WO CONTRAST Result Date: 08/25/2023 CLINICAL DATA:  Code stroke. Initial evaluation for acute neuro deficit, stroke suspected. EXAM: CT HEAD WITHOUT CONTRAST TECHNIQUE: Contiguous axial images were obtained from the base of the skull through the vertex without intravenous contrast. RADIATION DOSE REDUCTION: This exam was performed according to the departmental dose-optimization program which includes automated exposure control, adjustment of the mA and/or kV according to patient size and/or use of iterative reconstruction technique. COMPARISON:  Prior studies from 08/21/2023 FINDINGS: Brain: Mild age-related cerebral atrophy with chronic small vessel ischemic disease. Small remote right  cerebellar infarct. No acute intracranial hemorrhage. No acute large vessel territory infarct. No mass lesion or midline shift. No hydrocephalus or extra-axial fluid collection. Vascular: No abnormal hyperdense vessel. Calcified atherosclerosis present at the skull base. Skull: Scalp soft tissues within normal limits.  Calvarium intact. Sinuses/Orbits: Globes orbital soft tissues within normal limits. Paranasal sinuses are clear. No mastoid effusion. Other: None. ASPECTS [facility] Stroke Program Early CT Score) - Ganglionic level infarction (caudate, lentiform nuclei, internal capsule, insula, M1-M3 cortex): 7 - Supraganglionic infarction (M4-M6 cortex): 3 Total score (0-10 with 10 being normal): 10 IMPRESSION: 1. No acute intracranial abnormality. 2. ASPECTS is 10. 3. 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 These results were communicated to Dr. Arora at 8:52 pm on 08/25/2023 by text page via the [facility] messaging system. Electronically Signed   By: Virgia Griffins M.D.   On: 08/25/2023 20:55      Signature  -   Lynnwood Sauer M.D on 08/27/2023 at 11:06 AM   -  To page go to [email or web address]

## 2023-08-27 NOTE — Progress Notes (Signed)
 PT Cancellation Note  Patient Details Name: Brent Ray. MRN: [id number] DOB: 08/13/1945   Cancelled Treatment:    Reason Eval/Treat Not Completed: Patient at procedure or test/unavailable (IR). Will check back as time allows.   Amey Ka, PT  Acute Rehab Services Secure chat preferred Office [phone]    Sharman Debar 08/27/2023, 9:13 AM

## 2023-08-27 NOTE — Transfer of Care (Signed)
 Immediate Anesthesia Transfer of Care Note  Patient: Brent Ray.  Procedure(s) Performed: RADIOLOGY WITH ANESTHESIA  Patient Location: PACU  Anesthesia Type:General  Level of Consciousness: awake and alert   Airway & Oxygen Therapy: Patient Spontanous Breathing and Patient connected to face mask oxygen  Post-op Assessment: Report given to RN and Post -op Vital signs reviewed and stable, moving all extremeties  Post vital signs: Reviewed and stable  Last Vitals:  Vitals Value Taken Time  BP 120/66 08/27/23 10:45  Temp    Pulse 70 08/27/23 10:48  Resp 12 08/27/23 10:48  SpO2 100 % 08/27/23 10:48  Vitals shown include unfiled device data.  Last Pain:  Vitals:   08/27/23 0730  TempSrc:   PainSc: 0-No pain         Complications: No notable events documented.

## 2023-08-27 NOTE — Progress Notes (Signed)
 Referring Physician(s): Xu,Jindong  Supervising Physician: Luellen Sages  Patient Status:  Brent Ray - In-pt  Chief Complaint:  S/p L four-vessel cerebral arteriogram   Brief history/Subjective: Pt report he was driving and had episode of sharp pain up his left neck/face and subsequent left eye blindness that lasted a couple hours, but has completely resolved. 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  Patient is status post left four-vessel cerebral arteriogram with Dr. Alvira Josephs 08/27/2023.  During procedure found that spontaneous revascularization had already occurred and no intervention was needed. Patient was extubated and transported to 4 N after recovery. At this time patient is lying in bed with wife at bedside.  He is A&O x 4, follows commands appropriately, denies headache, nausea, vomiting.  Allergies: Amiodarone, Flecainide , Carvedilol , Dapagliflozin, Eplerenone, Flomax [tamsulosin hcl], Lisinopril , Metoprolol , Sacubitril-valsartan, Spironolactone , Statins, and Sulfasalazine  Medications: Prior to Admission medications   Medication Sig Start Date End Date Taking? Authorizing Provider  cephALEXin (KEFLEX) 500 MG capsule Take 500 mg by mouth once. Only for dental appointments 10/21/22  Yes [provider]  CRANBERRY PO Take 500 mg by mouth daily.   Yes [provider]  folic acid  (FOLVITE ) 1 MG tablet Take 1 mg by mouth daily.  NOT on Sunday d/t methotrexate  12/13/22  Yes [provider]  furosemide  (LASIX ) 20 MG tablet Take 1 tablet (20 mg total) by mouth daily. Patient taking differently: Take 20 mg by mouth every other day. 08/21/23  Yes Sabharwal, Aditya, DO  losartan  (COZAAR ) 25 MG tablet Take 0.5 tablets (12.5 mg total) by mouth daily. 08/21/23  Yes Sabharwal, Aditya, DO  methotrexate  (RHEUMATREX) 2.5 MG tablet Take 2.5 mg by mouth every Friday. 6 pills weekly   Yes [provider]  mexiletine (MEXITIL ) 150 MG capsule Take 150 mg by mouth 2 (two) times daily.   Yes [provider]  NITROSTAT  0.4 MG SL tablet DISSOLVE 1 TABLET UNDER TONGUE EVERY 5 MINUTES AS NEEDED FOR CHEST PAIN Patient taking differently: Place 0.4 mg under the tongue every 5 (five) minutes as needed for chest pain. 05/11/14  Yes Gerard Knight, MD  ondansetron  (ZOFRAN ) 4 MG tablet Take 4 mg by mouth every 8 (eight) hours as needed for nausea. 01/15/23  Yes [provider]  pantoprazole  (PROTONIX ) 40 MG tablet Take 40 mg by mouth every evening.   Yes [provider]  potassium chloride  SA (KLOR-CON  M) 20 MEQ tablet Take 20 mEq by mouth daily.   Yes [provider]  Probiotic Product (PROBIOTIC DAILY PO) Take 110 mg by mouth daily.   Yes [provider]  Rivaroxaban  (XARELTO ) 15 MG TABS tablet Take 15 mg by mouth daily in the afternoon.   Yes [provider]  rosuvastatin  (CRESTOR ) 5 MG tablet Take 5 mg by mouth every Sunday.   Yes [provider]     Vital Signs: BP 126/60   Pulse 70   Temp 97.8 F (36.6 C)   Resp 12   Ht 5' 8 (1.727 m)   Wt 123 lb 3.8 oz (55.9 kg)   SpO2 100%   BMI 18.74 kg/m   Alert, awake, and oriented x3. Speech and comprehension intact. Extensive ecchymosis to bilateral upper extremities, present prior to procedure PERRL bilaterally. EOMs intact bilaterally without nystagmus or subjective diplopia. Visual fields intact bilaterally. No facial asymmetry. Tongue midline. Motor power symmetric proportional to effort. No pronator drift. Fine motor and coordination  intact and symmetric. 5/5 Strength bilaterally Common femoral artery puncture site looks good, no bleeding, no hematoma, no pseudoaneurysm.  Imaging: DG Chest Port 1 View Result Date: 08/26/2023 CLINICAL DATA:  Shortness of breath. EXAM: PORTABLE CHEST 1 VIEW COMPARISON:  07/31/2023 FINDINGS: The lungs are clear without focal  pneumonia, edema, pneumothorax or pleural effusion. Cardiopericardial silhouette is at upper limits of normal for size. Left-sided permanent pacemaker/AICD again noted. Telemetry leads overlie the chest. No acute bony abnormality. IMPRESSION: No active disease. Electronically Signed   By: Donnal Fusi M.D.   On: 08/26/2023 06:43   CT ANGIO HEAD NECK W WO CM W PERF (CODE STROKE) Result Date: 08/25/2023 CLINICAL DATA:  Initial evaluation for acute neuro deficit, stroke suspected, right-sided weakness. Aphasia. EXAM: CT ANGIOGRAPHY HEAD AND NECK CT PERFUSION BRAIN TECHNIQUE: Multidetector CT imaging of the head and neck was performed using the standard protocol during bolus administration of intravenous contrast. Multiplanar CT image reconstructions and MIPs were obtained to evaluate the vascular anatomy. Carotid stenosis measurements (when applicable) are obtained utilizing NASCET criteria, using the distal internal carotid diameter as the denominator. Multiphase CT imaging of the brain was performed following IV bolus contrast injection. Subsequent parametric perfusion maps were calculated using RAPID software. RADIATION DOSE REDUCTION: This exam was performed according to the departmental dose-optimization program which includes automated exposure control, adjustment of the mA and/or kV according to patient size and/or use of iterative reconstruction technique. CONTRAST:  OMNIPAQUE  IOHEXOL  350 MG/ML SOLN COMPARISON:  CT from earlier the same day as well as prior studies from 08/21/2023. FINDINGS: CTA NECK FINDINGS Aortic arch: Visualized aortic arch within normal limits for caliber standard 3 vessel morphology. Aortic atherosclerosis. No significant stenosis about the origin the great vessels. Right carotid system: Right common and internal carotid arteries are patent without dissection. Moderate calcified plaque about the right carotid bulb without hemodynamically significant greater than 50% stenosis.  Minimal irregularity about the mid cervical right ICA noted, favored atherosclerotic in nature. Left carotid system: Left common carotid artery remains patent to the bifurcation without stenosis. Atheromatous change about the left carotid bifurcation without hemodynamically significant stenosis. Previously seen left ICA occlusion is no longer seen, with interval revascularization of the left ICA in the neck. Left ICA remains patent the skull base without significant stenosis or evidence for dissection. Vertebral arteries: Both vertebral arteries arise from subclavian arteries. No proximal subclavian artery stenosis. Evaluation of vertebral artery somewhat limited by adjacent venous contamination. Tubal arteries M cells are patent without stenosis or dissection. Skeleton: No discrete or worrisome osseous lesions. Moderate cervical spondylosis. Other neck: No other acute finding. Upper chest: Streak artifact from a left-sided pacemaker/AICD. No other acute finding. Review of the MIP images confirms the above findings CTA HEAD FINDINGS Anterior circulation: Atheromatous change about the carotid siphons bilaterally. No significant stenosis on the right. The left ICA is now patent to the terminus. Focal filling defect within the cavernous left ICA at the level of the anterior genu consistent with small volume thrombus (series 5, image 133). This likely reflects a small amount of migrated clot from the neck below given prior left ICA occlusion. A1 segments patent bilaterally. Normal anterior communicating artery complex. Azygous ACA widely patent without stenosis. No M1 stenosis or occlusion. Right MCA branches patent and well perfused. On the left, there is a distal left M3 occlusion, likely embolic (a series 5, image 99). Remainder of the left MCA branches remain patent. Posterior circulation: Both V4 segments patent without stenosis. Both  PICA patent at their origins. Basilar patent without stenosis. Superior cerebral  arteries patent bilaterally. Atheromatous irregularity about the PCAs with associated mild to moderate bilateral P2 stenoses. PCAs remain patent to their distal aspects. Venous sinuses: Grossly patent allowing for timing the contrast bolus. Anatomic variants: Azygous ACA.  No aneurysm. Review of the MIP images confirms the above findings CT Brain Perfusion Findings: ASPECTS: 10 CBF (<30%) Volume: 0mL Perfusion (Tmax>6.0s) volume: 20mL Mismatch Volume: 20mL Infarction Location:Negative CT perfusion for acute core infarct. 20 mL area of delayed perfusion at the mid-posterior left frontal lobe, in keeping with the distal left M3 occlusion. No other perfusion abnormality. IMPRESSION: 1. Interval revascularization of the left ICA in the neck, with the left ICA now patent to the terminus. Focal filling defect within the cavernous left ICA at the level of the anterior genu consistent with small volume thrombus, likely migrated clot from the neck below given prior left ICA occlusion. Secondary severe stenosis at this level. 2. Downstream acute distal left M3 occlusion, likely embolic. 3. Negative CT perfusion for acute core infarct. 20 mL area of delayed perfusion at the mid-posterior left frontal lobe, in keeping with the left M3 occlusion. 4. Atheromatous change about the carotid bifurcations and carotid siphons without hemodynamically significant stenosis. 5.  Aortic Atherosclerosis (ICD10-I70.0). Results were discussed by telephone at the time of interpretation on 08/25/2023 at 9:10 p.m. to provider [facility] , who verbally acknowledged these results. Electronically Signed   By: Brent Ray M.D.   On: 08/25/2023 21:55   CT HEAD CODE STROKE WO CONTRAST Result Date: 08/25/2023 CLINICAL DATA:  Code stroke. Initial evaluation for acute neuro deficit, stroke suspected. EXAM: CT HEAD WITHOUT CONTRAST TECHNIQUE: Contiguous axial images were obtained from the base of the skull through the vertex without  intravenous contrast. RADIATION DOSE REDUCTION: This exam was performed according to the departmental dose-optimization program which includes automated exposure control, adjustment of the mA and/or kV according to patient size and/or use of iterative reconstruction technique. COMPARISON:  Prior studies from 08/21/2023 FINDINGS: Brain: Mild age-related cerebral atrophy with chronic small vessel ischemic disease. Small remote right cerebellar infarct. No acute intracranial hemorrhage. No acute large vessel territory infarct. No mass lesion or midline shift. No hydrocephalus or extra-axial fluid collection. Vascular: No abnormal hyperdense vessel. Calcified atherosclerosis present at the skull base. Skull: Scalp soft tissues within normal limits.  Calvarium intact. Sinuses/Orbits: Globes orbital soft tissues within normal limits. Paranasal sinuses are clear. No mastoid effusion. Other: None. ASPECTS [facility] Stroke Program Early CT Score) - Ganglionic level infarction (caudate, lentiform nuclei, internal capsule, insula, M1-M3 cortex): 7 - Supraganglionic infarction (M4-M6 cortex): 3 Total score (0-10 with 10 being normal): 10 IMPRESSION: 1. No acute intracranial abnormality. 2. ASPECTS is 10. 3. 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 These results were communicated to Dr. Arora at 8:52 pm on 08/25/2023 by text page via the [facility] messaging system. Electronically Signed   By: Brent Ray M.D.   On: 08/25/2023 20:55    Labs:  CBC: Recent Labs    08/24/23 0647 08/25/23 1608 08/26/23 1134 08/27/23 0534  WBC 4.8 8.5 8.1 6.6  HGB 12.5* 13.8 12.2* 12.1*  HCT 37.5* 40.7 36.6* 35.0*  PLT 222 197 245 237    COAGS: Recent Labs    08/01/23 0402 08/24/23 0647 08/24/23 1837 08/25/23 0644 08/27/23 0847  INR 1.1  --   --   --  1.1  APTT 24 54*  59* 110*  --     BMP: Recent Labs    08/23/23 0736 08/25/23 0644 08/26/23 1134  08/27/23 0534  NA 139 138 135 138  K 3.7 3.3* 3.7 3.7  CL 111 107 106 111  CO2 17* 19* 17* 19*  GLUCOSE 88 96 91 97  BUN 21 23 24* 25*  CALCIUM  8.6* 9.0 8.8* 8.7*  CREATININE 1.83* 2.24* 2.00* 2.14*  GFRNONAA 38* 30* 34* 31*    LIVER FUNCTION TESTS: Recent Labs    08/01/23 0402 08/08/23 1654 08/21/23 1722  BILITOT 1.2 1.0 1.3*  AST 21 22 37  ALT 32 30 33  ALKPHOS 42 49 57  PROT 6.0* 6.1* 6.4*  ALBUMIN  3.3* 3.3* 3.5    Assessment and Plan:  S/p L four-vessel cerebral arteriogram   No procedural related concerns at this time. Will recheck arterial puncture site at 24 hours.   Continue plan for patient to be transitioned to apixaban and aspirin  as discussed in pharmacy note.  To remain flat until 5 PM at which time head of bed may be elevated and diet may be advanced slowly (no heavy meals until tomorrow even if patient is tolerating diet advancement well). Nursing discussed titrating Cleviprex based on automatic blood pressure cuff as opposed to art line values, Dr. Alvira Josephs agrees with this approach for this patient based on symptomatic response (dizziness, headache) to titrating Cleviprex by her line.  Wife is at bedside and present for this discussion, aware of plan.  Denies any questions or concerns at this time.   Electronically Signed: Terressa Fess, NP 08/27/2023, 3:03 PM   I spent a total of 15 Minutes at the the patient's bedside AND on the patient's hospital floor or unit, greater than 50% of which was counseling/coordinating care for S/p L four-vessel cerebral arteriogram

## 2023-08-27 NOTE — Consult Note (Signed)
 NAME:  Brent Ray., MRN:  [id number], DOB:  02-Sep-1945, LOS: 3 ADMISSION DATE:  08/21/2023, CONSULTATION DATE:  08/27/2023 REFERRING MD: Zelda Hickman  CHIEF COMPLAINT:  stroke   History of Present Illness:  78 year old male with past medical history of paroxysmal atrial fibrillation on Xarelto , CHF, symptomatic bradycardia s/p AICD placement, hyperlipidemia, hypertension, pre-diabetes, GERD, Barrett's esophagus, RA, CKD, left subclavian vein thrombosis who initially presented to [facility] on 08/21/23 with complaint of dizziness, headache and visual change that began around 1610 PM after leaving his cardiology appointment. He had CT head which showed occluded left internal carotid artery. He was evaluated by neurology and they recommended transfer to [facility] for close monitoring and CT perfusion scan. Perfusion study negative and admitted to hospitalist for ongoing stroke work up.   Pertinent  Medical History  paroxysmal atrial fibrillation on Xarelto , CHF, symptomatic bradycardia s/p AICD placement, hyperlipidemia, hypertension, pre-diabetes, GERD, Barrett's esophagus, RA, CKD, left subclavian vein thrombosis  Significant Hospital Events: Including procedures, antibiotic start and stop dates in addition to other pertinent events   6/10: [facility] for stroke symptoms. 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 CT perfusion study with no perfusion deficit. Admitted to hospitalist.  6/11: transferred to [facility]; symptoms are resolved  6/12: MRI brain with 2mm acute cortical infarct in left occipital lobe, known left ICA occlusion > heparin  started. Neurology spoke with [facility] and agree to cerebral angiogram and possible intervention  6/14: acute aphasia, right sided weakness LKW 1930 > CT head with no acute findings or bleed. CTA with similar findings @ ICU and possible left M3 distal segment occlusion  6/16: NIR with complete revascularization of left ICA extra and intracranially, complete revascularization of M3 region > admit to ICU  for 24 hours post procedurally. IR recommend aspirin  81mg  daily and Brilinta 90mg  BID   Interim History / Subjective:  Seen in PACU - awake, alert and oriented. Cleviprex turned off. Asymptomatic.   Objective   Blood pressure 123/60, pulse 69, temperature 97.7 F (36.5 C), resp. rate 11, height 5' 8 (1.727 m), weight 55.9 kg, SpO2 100%.        Intake/Output Summary (Last 24 hours) at 08/27/2023 1126 Last data filed at 08/27/2023 1017 Gross per 24 hour  Intake 53 ml  Output 710 ml  Net -657 ml   Filed Weights   08/21/23 1652 08/27/23 0715  Weight: 56 kg 55.9 kg    Examination: General: older male, laying in bed in reverse trendelenburg  HENT: perrla, no facial droop, mucous membranes dry  Lungs: clear bilaterally, room air, breaths even and unlabored  Cardiovascular: s1s2, no murmur, doppler pedal pulses Abdomen: abdomen flat and soft  Extremities: dpppler pulses present bilateraly, extremities warm, cath site without large hematoma  Neuro: a&ox4, no facial droop or aphasia, no visual field cuts, strength equal bilaterally, sensation intact  GU: no foley   Resolved Hospital Problem list    Assessment & Plan:  Amaurosis fugax with 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 6/10 admitted with visual scotoma which resolved spontaneously, 6/14 with aphasia and right sided weakness that resolved. CT head and neck with Left ICU occlusion, possible left M3 occlusion. NIR on 6/16 with successful revascularization.  - ICU s/p NIR for 24 hours  - aspirin  81mg  daily  - brilinta 90mg  BID  - cleviprex for SBP 120-160 by arterial line  -  frequent neuro checks  - stat imaging for any change  - con't heparin  gtt, will need transition back to DOAC. May need change to Eliquis given that he stroked on Xarelto   - echo, Lipids, A1c completed with EF 35-40%, LDL 54, A1c 5.6 - PT/OT/SLP -  mobilize as soon as possible  - monitor intervention site for hematomat formation   Paroxysmal atrial fibrillation on Xarelto   Congestive heart failure  Symptomatic bradycardia s/p AICD placement  Has had previous ablation without success. Rate controlled a fib on Xarelto  outpatient. echo repeated on admission with EF 35-40%, RWMA, mod-severe MR, probably atrial shunting L to R.  - tele monitoring  - con't heparin  gtt  - consider switch to Eliquis given failure on Xarelto   - GDMT as able   Hypertension  - cleviprex for now  - restart losartan  when clinically indicated   Hyperlipidemia  - rosuvastatin  5mg  weekly   CKD III  Baseline ~1.8-2.0. currently 2.14  - trend bmp, mag, phos - replete elytes - strict I&O - Avoid nephrotoxic agents, renally dose medications - ensure adequate renal perfusion   RA  - on methotrexate  at home  - keep on hold for now   Best Practice (right click and Reselect all SmartList Selections daily)   Diet/type: Regular consistency (see orders) DVT prophylaxis: DOAC GI prophylaxis: PPI Lines: Arterial Line and yes and it is still needed Foley:  N/A Code Status:  full code Last date of multidisciplinary goals of care discussion [updated patient on plan of care at bedside in PACU]  Labs   CBC: Recent Labs  Lab 08/21/23 1722 08/24/23 0647 08/25/23 1608 08/26/23 1134 08/27/23 0534  WBC 7.0 4.8 8.5 8.1 6.6  NEUTROABS 5.7  --   --   --   --   HGB 12.4* 12.5* 13.8 12.2* 12.1*  HCT 36.5* 37.5* 40.7 36.6* 35.0*  MCV 101.1* 98.4 98.8 100.0 99.4  PLT 264 222 197 245 237    Basic Metabolic Panel: Recent Labs  Lab 08/21/23 1722 08/23/23 0736 08/25/23 0644 08/26/23 1134 08/27/23 0534  NA 140 139 138 135 138  K 3.4* 3.7 3.3* 3.7 3.7  CL 114* 111 107 106 111  CO2 15* 17* 19* 17* 19*  GLUCOSE 128* 88 96 91 97  BUN 32* 21 23 24* 25*  CREATININE 2.12* 1.83* 2.24* 2.00* 2.14*  CALCIUM  8.8* 8.6* 9.0 8.8* 8.7*  MG  --  1.9 1.9 1.8 2.0  PHOS   --   --  3.3 2.6 3.0   GFR: Estimated Creatinine Clearance: 23.6 mL/min (A) (by C-G formula based on SCr of 2.14 mg/dL (H)). Recent Labs  Lab 08/24/23 0647 08/25/23 1608 08/26/23 1134 08/27/23 0534  WBC 4.8 8.5 8.1 6.6    Liver Function Tests: Recent Labs  Lab 08/21/23 1722  AST 37  ALT 33  ALKPHOS 57  BILITOT 1.3*  PROT 6.4*  ALBUMIN  3.5   No results for input(s): LIPASE, AMYLASE in the last 168 hours. No results for input(s): AMMONIA in the last 168 hours.  ABG No results found for: PHART, PCO2ART, PO2ART, HCO3, TCO2, ACIDBASEDEF, O2SAT   Coagulation Profile: Recent Labs  Lab 08/27/23 0847  INR 1.1    Cardiac Enzymes: No results for input(s): CKTOTAL, CKMB, CKMBINDEX, TROPONINI in the last 168 hours.  HbA1C: Hgb A1c MFr Bld  Date/Time Value Ref Range Status  08/21/2023 05:22 PM 5.6 4.8 - 5.6 % Final    Comment:    (NOTE)  Prediabetes: 5.7 - 6.4         Diabetes: >6.4         Glycemic control for adults with diabetes: <7.0   01/24/2012 08:29 AM 5.6 4.6 - 6.5 % Final    Comment:    Glycemic Control Guidelines for People with Diabetes:Non Diabetic:  <6%Goal of Therapy: <7%Additional Action Suggested:  >8%     CBG: Recent Labs  Lab 08/25/23 2019  GLUCAP 82    Review of Systems:   As above  Past Medical History:  He,  has a past medical history of A-fib (HCC), AICD (automatic cardioverter/defibrillator) present, Barrett's esophagus (2011), CHF (congestive heart failure) (HCC), Chronic kidney disease, Coronary atherosclerosis of native coronary artery, Essential hypertension, benign, GERD (gastroesophageal reflux disease), Heart block AV first degree, History of kidney stones, Hypercholesterolemia, Mixed hyperlipidemia, NSVT (nonsustained ventricular tachycardia) (HCC), PAF (paroxysmal atrial fibrillation) (HCC), Pre-diabetes, Rheumatoid arthritis (HCC), Sinus bradycardia, Skin cancer, and Sleep apnea.   Surgical  History:   Past Surgical History:  Procedure Laterality Date   CARDIAC CATHETERIZATION N/A 12/13/2015   Procedure: Left Heart Cath and Coronary Angiography;  Surgeon: Avanell Leigh, MD;  Location[facility] INVASIVE CV LAB;  Service: Cardiovascular;  Laterality: N/A;   CARDIOVERSION N/A 04/11/2016   Procedure: CARDIOVERSION;  Surgeon: Maudine Sos, MD;  Location: [facility] ENDOSCOPY;  Service: Cardiovascular;  Laterality: N/A;   COLONOSCOPY  03/13/2012   [place]--Dr. Spainhour   EP IMPLANTABLE DEVICE N/A 02/01/2016   Procedure: Pacemaker Implant;  Surgeon: Jolly Needle, MD;  Location: [facility] INVASIVE CV LAB;  Service: Cardiovascular;  Laterality: N/A;   ESOPHAGOGASTRODUODENOSCOPY  03/13/2012   [place], [place]--Dr. Spainhour   HERNIA REPAIR     2   KIDNEY STONE SURGERY     LEFT HEART CATHETERIZATION WITH CORONARY ANGIOGRAM N/A 08/20/2012   Procedure: LEFT HEART CATHETERIZATION WITH CORONARY ANGIOGRAM;  Surgeon: Eilleen Grates, MD;  Location: [facility] CATH LAB;  Service: Cardiovascular;  Laterality: N/A;   Left leg surgery  03/13/1977   SKIN CANCER EXCISION     TOTAL HIP ARTHROPLASTY Left 01/11/2022   Procedure: TOTAL HIP ARTHROPLASTY ANTERIOR APPROACH;  Surgeon: Adonica Hoose, MD;  Location: [facility] ORS;  Service: Orthopedics;  Laterality: Left;  150     Social History:   reports that he has never smoked. He has never been exposed to tobacco smoke. He has never used smokeless tobacco. He reports current alcohol  use of about 2.0 standard drinks of alcohol  per week. He reports that he does not use drugs.   Family History:  His family history includes Colon cancer in his mother; Other in his sister, sister, sister, and son.   Allergies Allergies  Allergen Reactions   Amiodarone Other (See Comments)    Pt reports vision changes and walking changes    Flecainide  Other (See Comments)    Severe headaches, blurred vision   Carvedilol  Other (See Comments)    Unknown    Dapagliflozin Diarrhea   Eplerenone  Other (See Comments)    Unknown    Flomax [Tamsulosin Hcl] Hives, Itching and Rash   Lisinopril  Nausea And Vomiting   Metoprolol  Other (See Comments)    Headaches, dizziness   Sacubitril-Valsartan Nausea And Vomiting   Spironolactone  Swelling and Other (See Comments)    Breast swelling, headaches   Statins Other (See Comments)    Leg cramps   Sulfasalazine Nausea Only     Home Medications  Prior to Admission medications   Medication Sig Start Date End Date Taking? Authorizing  Provider  cephALEXin (KEFLEX) 500 MG capsule Take 500 mg by mouth once. Only for dental appointments 10/21/22  Yes [provider]  CRANBERRY PO Take 500 mg by mouth daily.   Yes [provider]  folic acid  (FOLVITE ) 1 MG tablet Take 1 mg by mouth daily.  NOT on Sunday d/t methotrexate  12/13/22  Yes [provider]  furosemide  (LASIX ) 20 MG tablet Take 1 tablet (20 mg total) by mouth daily. Patient taking differently: Take 20 mg by mouth every other day. 08/21/23  Yes Sabharwal, Aditya, DO  losartan  (COZAAR ) 25 MG tablet Take 0.5 tablets (12.5 mg total) by mouth daily. 08/21/23  Yes Sabharwal, Aditya, DO  methotrexate  (RHEUMATREX) 2.5 MG tablet Take 2.5 mg by mouth every Friday. 6 pills weekly   Yes [provider]  mexiletine (MEXITIL ) 150 MG capsule Take 150 mg by mouth 2 (two) times daily.   Yes [provider]  NITROSTAT  0.4 MG SL tablet DISSOLVE 1 TABLET UNDER TONGUE EVERY 5 MINUTES AS NEEDED FOR CHEST PAIN Patient taking differently: Place 0.4 mg under the tongue every 5 (five) minutes as needed for chest pain. 05/11/14  Yes Gerard Knight, MD  ondansetron  (ZOFRAN ) 4 MG tablet Take 4 mg by mouth every 8 (eight) hours as needed for nausea. 01/15/23  Yes [provider]  pantoprazole  (PROTONIX ) 40 MG tablet Take 40 mg by mouth every evening.   Yes [provider]  potassium chloride  SA (KLOR-CON  M) 20 MEQ tablet Take 20 mEq by mouth daily.   Yes  [provider]  Probiotic Product (PROBIOTIC DAILY PO) Take 110 mg by mouth daily.   Yes [provider]  Rivaroxaban  (XARELTO ) 15 MG TABS tablet Take 15 mg by mouth daily in the afternoon.   Yes [provider]  rosuvastatin  (CRESTOR ) 5 MG tablet Take 5 mg by mouth every Sunday.   Yes [provider]     Critical care time: 80    Arlys Lamer [facility] 08/27/23 12:37 PM  Please see [email or web address] for pager details.  From 7A-7P if no response, please call [phone] After hours, please call ELink [phone]

## 2023-08-27 NOTE — Progress Notes (Signed)
 Patient ID: Brent Ray., male   DOB: April 24, 1945, 78 y.o.   MRN: [id number]  INR.  Patient seen and examined. Events over the weekend noted.  Presently patient complains of no neurological dysfunction. Patient reports no further episodes of amaurosis fugax, or right-sided weakness, or speech difficulties.  On examination patient is awake alert oriented to time place space.  No gross lateralizing neurologic abnormalities identified. The procedure, the reasons, and alternatives were reviewed in detail with the patient.  The patient developed recurrent symptoms despite being on  IV heparin  and dual antiplatelets over the weekend. Patient expressed understanding of left internal carotid revascularization under general anesthesia  with attendant risks of a new stroke of 2 to 3%,with potential for worsening neurological condition and fatality,and worsening renal dysfunction.  Patient  expressed understanding and wishes to proceed.  Patient's spouse was also updated of the plan.  Jory Ng MD.

## 2023-08-27 NOTE — Sedation Documentation (Signed)
 ACT = [id number]

## 2023-08-27 NOTE — Sedation Documentation (Signed)
 Pt will be under the care of anesthesia. See CRNA charting.

## 2023-08-27 NOTE — Sedation Documentation (Signed)
 On insertion of angioseal, IR tech palpated small hematoma the size of a quarter at puncture site. Pressure held for 5 min, hematoma no longer present. Pressure dressing applied by IR tech.

## 2023-08-27 NOTE — Progress Notes (Addendum)
 STROKE TEAM PROGRESS NOTE   SUBJECTIVE (INTERVAL HISTORY)  Patient just returned from angio this morning.  Neurological exam is stable without focal symptoms.  Diagnostic catheter angiogram today showed recanalization of the left carotid and M3 branch as well.  No endovascularly treatable lesion was identified.  Remains on IV heparin  as well as dual antiplatelet therapy but has now developed a hematoma in his significant bruising of his extremities Vital signs stable.   OBJECTIVE Temp:  [97.6 F (36.4 C)-97.9 F (36.6 C)] 97.9 F (36.6 C) (06/16 0715) Pulse Rate:  [61-74] 61 (06/16 0715) Cardiac Rhythm: Ventricular paced (06/15 2025) Resp:  [15-19] 18 (06/16 0715) BP: (121-151)/(62-99) 151/88 (06/16 0715) SpO2:  [99 %-100 %] 100 % (06/16 0715) Weight:  [55.9 kg] 55.9 kg (06/16 0715)  Recent Labs  Lab 08/25/23 2019  GLUCAP 82   Recent Labs  Lab 08/21/23 1722 08/23/23 0736 08/25/23 0644 08/26/23 1134 08/27/23 0534  NA 140 139 138 135 138  K 3.4* 3.7 3.3* 3.7 3.7  CL 114* 111 107 106 111  CO2 15* 17* 19* 17* 19*  GLUCOSE 128* 88 96 91 97  BUN 32* 21 23 24* 25*  CREATININE 2.12* 1.83* 2.24* 2.00* 2.14*  CALCIUM  8.8* 8.6* 9.0 8.8* 8.7*  MG  --  1.9 1.9 1.8 2.0  PHOS  --   --  3.3 2.6 3.0   Recent Labs  Lab 08/21/23 1722  AST 37  ALT 33  ALKPHOS 57  BILITOT 1.3*  PROT 6.4*  ALBUMIN  3.5   Recent Labs  Lab 08/21/23 1722 08/24/23 0647 08/25/23 1608 08/26/23 1134 08/27/23 0534  WBC 7.0 4.8 8.5 8.1 6.6  NEUTROABS 5.7  --   --   --   --   HGB 12.4* 12.5* 13.8 12.2* 12.1*  HCT 36.5* 37.5* 40.7 36.6* 35.0*  MCV 101.1* 98.4 98.8 100.0 99.4  PLT 264 222 197 245 237   No results for input(s): CKTOTAL, CKMB, CKMBINDEX, TROPONINI in the last 168 hours. Recent Labs    08/27/23 0847  LABPROT 14.3  INR 1.1   No results for input(s): COLORURINE, LABSPEC, PHURINE, GLUCOSEU, HGBUR, BILIRUBINUR, KETONESUR, PROTEINUR, UROBILINOGEN, NITRITE,  LEUKOCYTESUR in the last 72 hours.  Invalid input(s): APPERANCEUR     Component Value Date/Time   CHOL 125 08/22/2023 0451   TRIG 43 08/22/2023 0451   HDL 62 08/22/2023 0451   CHOLHDL 2.0 08/22/2023 0451   VLDL 9 08/22/2023 0451   LDLCALC 54 08/22/2023 0451   Lab Results  Component Value Date   HGBA1C 5.6 08/21/2023      Component Value Date/Time   LABOPIA NONE DETECTED 08/21/2023 1956   COCAINSCRNUR NONE DETECTED 08/21/2023 1956   LABBENZ NONE DETECTED 08/21/2023 1956   AMPHETMU NONE DETECTED 08/21/2023 1956   THCU NONE DETECTED 08/21/2023 1956   LABBARB NONE DETECTED 08/21/2023 1956    Recent Labs  Lab 08/21/23 1722  ETH <15    I have personally reviewed the radiological images below and agree with the radiology interpretations.  DG Chest Port 1 View Result Date: 08/26/2023 CLINICAL DATA:  Shortness of breath. EXAM: PORTABLE CHEST 1 VIEW COMPARISON:  07/31/2023 FINDINGS: The lungs are clear without focal pneumonia, edema, pneumothorax or pleural effusion. Cardiopericardial silhouette is at upper limits of normal for size. Left-sided permanent pacemaker/AICD again noted. Telemetry leads overlie the chest. No acute bony abnormality. IMPRESSION: No active disease. Electronically Signed   By: Donnal Fusi M.D.   On: 08/26/2023 06:43   CT ANGIO  HEAD NECK W WO CM W PERF (CODE STROKE) Result Date: 08/25/2023 CLINICAL DATA:  Initial evaluation for acute neuro deficit, stroke suspected, right-sided weakness. Aphasia. EXAM: CT ANGIOGRAPHY HEAD AND NECK CT PERFUSION BRAIN TECHNIQUE: Multidetector CT imaging of the head and neck was performed using the standard protocol during bolus administration of intravenous contrast. Multiplanar CT image reconstructions and MIPs were obtained to evaluate the vascular anatomy. Carotid stenosis measurements (when applicable) are obtained utilizing NASCET criteria, using the distal internal carotid diameter as the denominator. Multiphase CT imaging  of the brain was performed following IV bolus contrast injection. Subsequent parametric perfusion maps were calculated using RAPID software. RADIATION DOSE REDUCTION: This exam was performed according to the departmental dose-optimization program which includes automated exposure control, adjustment of the mA and/or kV according to patient size and/or use of iterative reconstruction technique. CONTRAST:  100mL OMNIPAQUE  IOHEXOL  350 MG/ML SOLN COMPARISON:  CT from earlier the same day as well as prior studies from 08/21/2023. FINDINGS: CTA NECK FINDINGS Aortic arch: Visualized aortic arch within normal limits for caliber standard 3 vessel morphology. Aortic atherosclerosis. No significant stenosis about the origin the great vessels. Right carotid system: Right common and internal carotid arteries are patent without dissection. Moderate calcified plaque about the right carotid bulb without hemodynamically significant greater than 50% stenosis. Minimal irregularity about the mid cervical right ICA noted, favored atherosclerotic in nature. Left carotid system: Left common carotid artery remains patent to the bifurcation without stenosis. Atheromatous change about the left carotid bifurcation without hemodynamically significant stenosis. Previously seen left ICA occlusion is no longer seen, with interval revascularization of the left ICA in the neck. Left ICA remains patent the skull base without significant stenosis or evidence for dissection. Vertebral arteries: Both vertebral arteries arise from subclavian arteries. No proximal subclavian artery stenosis. Evaluation of vertebral artery somewhat limited by adjacent venous contamination. Tubal arteries M cells are patent without stenosis or dissection. Skeleton: No discrete or worrisome osseous lesions. Moderate cervical spondylosis. Other neck: No other acute finding. Upper chest: Streak artifact from a left-sided pacemaker/AICD. No other acute finding. Review of the  MIP images confirms the above findings CTA HEAD FINDINGS Anterior circulation: Atheromatous change about the carotid siphons bilaterally. No significant stenosis on the right. The left ICA is now patent to the terminus. Focal filling defect within the cavernous left ICA at the level of the anterior genu consistent with small volume thrombus (series 5, image 133). This likely reflects a small amount of migrated clot from the neck below given prior left ICA occlusion. A1 segments patent bilaterally. Normal anterior communicating artery complex. Azygous ACA widely patent without stenosis. No M1 stenosis or occlusion. Right MCA branches patent and well perfused. On the left, there is a distal left M3 occlusion, likely embolic (a series 5, image 99). Remainder of the left MCA branches remain patent. Posterior circulation: Both V4 segments patent without stenosis. Both PICA patent at their origins. Basilar patent without stenosis. Superior cerebral arteries patent bilaterally. Atheromatous irregularity about the PCAs with associated mild to moderate bilateral P2 stenoses. PCAs remain patent to their distal aspects. Venous sinuses: Grossly patent allowing for timing the contrast bolus. Anatomic variants: Azygous ACA.  No aneurysm. Review of the MIP images confirms the above findings CT Brain Perfusion Findings: ASPECTS: 10 CBF (<30%) Volume: 0mL Perfusion (Tmax>6.0s) volume: 20mL Mismatch Volume: 20mL Infarction Location:Negative CT perfusion for acute core infarct. 20 mL area of delayed perfusion at the mid-posterior left frontal lobe, in keeping with the distal  left M3 occlusion. No other perfusion abnormality. IMPRESSION: 1. Interval revascularization of the left ICA in the neck, with the left ICA now patent to the terminus. Focal filling defect within the cavernous left ICA at the level of the anterior genu consistent with small volume thrombus, likely migrated clot from the neck below given prior left ICA occlusion.  Secondary severe stenosis at this level. 2. Downstream acute distal left M3 occlusion, likely embolic. 3. Negative CT perfusion for acute core infarct. 20 mL area of delayed perfusion at the mid-posterior left frontal lobe, in keeping with the left M3 occlusion. 4. Atheromatous change about the carotid bifurcations and carotid siphons without hemodynamically significant stenosis. 5.  Aortic Atherosclerosis (ICD10-I70.0). Results were discussed by telephone at the time of interpretation on 08/25/2023 at 9:10 p.m. to provider [facility] , who verbally acknowledged these results. Electronically Signed   By: Virgia Griffins M.D.   On: 08/25/2023 21:55   CT HEAD CODE STROKE WO CONTRAST Result Date: 08/25/2023 CLINICAL DATA:  Code stroke. Initial evaluation for acute neuro deficit, stroke suspected. EXAM: CT HEAD WITHOUT CONTRAST TECHNIQUE: Contiguous axial images were obtained from the base of the skull through the vertex without intravenous contrast. RADIATION DOSE REDUCTION: This exam was performed according to the departmental dose-optimization program which includes automated exposure control, adjustment of the mA and/or kV according to patient size and/or use of iterative reconstruction technique. COMPARISON:  Prior studies from 08/21/2023 FINDINGS: Brain: Mild age-related cerebral atrophy with chronic small vessel ischemic disease. Small remote right cerebellar infarct. No acute intracranial hemorrhage. No acute large vessel territory infarct. No mass lesion or midline shift. No hydrocephalus or extra-axial fluid collection. Vascular: No abnormal hyperdense vessel. Calcified atherosclerosis present at the skull base. Skull: Scalp soft tissues within normal limits.  Calvarium intact. Sinuses/Orbits: Globes orbital soft tissues within normal limits. Paranasal sinuses are clear. No mastoid effusion. Other: None. ASPECTS [facility] Stroke Program Early CT Score) - Ganglionic level infarction (caudate, lentiform  nuclei, internal capsule, insula, M1-M3 cortex): 7 - Supraganglionic infarction (M4-M6 cortex): 3 Total score (0-10 with 10 being normal): 10 IMPRESSION: 1. No acute intracranial abnormality. 2. ASPECTS is 10. 3. 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 These results were communicated to Dr. Arora at 8:52 pm on 08/25/2023 by text page via the [facility] messaging system. Electronically Signed   By: Virgia Griffins M.D.   On: 08/25/2023 20:55   MR BRAIN WO CONTRAST Result Date: 08/23/2023 CLINICAL DATA:  Provided history: Vision changes. Headache. Dizziness. EXAM: MRI HEAD WITHOUT CONTRAST TECHNIQUE: Multiplanar, multiecho pulse sequences of the brain and surrounding structures were obtained without intravenous contrast. COMPARISON:  Non-contrast head CT, CT angiogram head/neck and CT perfusion head 08/21/2023. FINDINGS: Brain: Mild generalized cerebral atrophy. 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 Multifocal T2 FLAIR hyperintense signal abnormality within the cerebral white matter, nonspecific but compatible with moderate chronic small vessel ischemic disease. Mild chronic small vessel ischemic changes within the pons. Small T2 hyperintense focus within the right caudate head likely reflecting a prominent perivascular space. Prominent perivascular space also present within the right basal ganglia inferiorly. 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 Nonspecific punctate chronic microhemorrhage within medial the right cerebellar hemisphere. No evidence of an intracranial mass. No extra-axial fluid collection. No midline shift. 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 Skull and upper cervical spine: No focal worrisome marrow lesion. Sinuses/Orbits: No mass or acute  finding within the imaged orbits. Prior but ocular lens replacement. No significant paranasal sinus disease. IMPRESSION: 1. 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 2. Known occlusion of the left internal carotid artery. 3. Chronic small vessel ischemic changes which are moderate in the cerebral white matter and mild in the pons. 4. Small chronic infarct within the right cerebellar hemisphere. 5. Mild generalized cerebral atrophy. Electronically Signed   By: Bascom Lily D.O.   On: 08/23/2023 13:22   CUP PACEART REMOTE DEVICE CHECK Result Date: 08/22/2023 ICD Scheduled remote reviewed. Normal device function.  Presenting rhythm: BIV paced HF diagnostics abnormal this monitoring period. BIV pacing at 91%; stable. No new events since 4/4 programmer session, prior to that 6 NSVT events, longest x 4 sec; V rates >200 bpm.  Likely ventricular driven. Next remote 91 days. Wright, CVRS  ECHOCARDIOGRAM COMPLETE Result Date: 08/22/2023    ECHOCARDIOGRAM REPORT   Patient Name:   Roosvelt Churchwell. Date of Exam: 08/22/2023 Medical Rec #:  [id number]            Height:       68.0 in Accession #:    [id number]           Weight:       123.4 lb Date of Birth:  [birth year]            BSA:          1.665 m Patient Age:    77 years             BP:           142/87 mmHg Patient Gender: M                    HR:           83 bpm. Exam Location:  [facility] Procedure: 2D Echo, Cardiac Doppler, Color Doppler and Strain Analysis (Both            Spectral and Color Flow Doppler were utilized during procedure). Indications:    stroke I63.9  History:        Patient has prior history of Echocardiogram examinations, most                 recent 01/29/2023. CHF, CAD; Risk Factors:Hypertension and                 Dyslipidemia.  Sonographer:    Kip Peon RDCS Referring Phys: [id number] [facility]  Sonographer Comments: Global longitudinal strain was attempted. IMPRESSIONS  1. Left ventricular  ejection fraction, by estimation, is 35 to 40%. The left ventricle has moderately decreased function. The left ventricle demonstrates regional wall motion abnormalities (see scoring diagram/findings for description). There is mild concentric left ventricular hypertrophy. Left ventricular diastolic parameters are indeterminate.  2. Right ventricular systolic function is normal. The right ventricular size is normal. There is mildly elevated pulmonary artery systolic pressure. The estimated right ventricular systolic pressure is 38.0 mmHg.  3. Left atrial size was moderately dilated.  4. The mitral valve is degenerative with mild posterior leaflet prolapse. Moderate to severe mitral valve regurgitation.  5. Tricuspid valve regurgitation is mild to moderate.  6. The aortic valve is tricuspid. There is mild calcification of the aortic valve. Aortic valve regurgitation is trivial. No aortic stenosis is present.  7. The  inferior vena cava is normal in size with greater than 50% respiratory variability, suggesting right atrial pressure of 3 mmHg.  8. Evidence of probable atrial level shunting detected by color flow Doppler, left to right at mid interatrial septum. Suggest agitated saline study. Comparison(s): Prior images reviewed side by side. LVEF 35-40% with stable WMA and septal motion consistent with RV pacing. Mild posterior leaflet mitral prolapse with moderate to severe mtiral regurgitation. 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 FINDINGS  Left Ventricle: Left ventricular ejection fraction, by estimation, is 35 to 40%. The left ventricle has moderately decreased function. The left ventricle demonstrates regional wall motion abnormalities. The left ventricular internal cavity size was normal in size. There is mild concentric left ventricular hypertrophy. Abnormal (paradoxical) septal motion, consistent with RV pacemaker. Left ventricular diastolic parameters are indeterminate.  LV Wall  Scoring: The entire inferior wall, posterior wall, and basal anterolateral segment are hypokinetic. The entire anterior wall, entire septum, apical lateral segment, mid anterolateral segment, and apex are normal. Right Ventricle: The right ventricular size is normal. No increase in right ventricular wall thickness. Right ventricular systolic function is normal. There is mildly elevated pulmonary artery systolic pressure. The tricuspid regurgitant velocity is 2.96  m/s, and with an assumed right atrial pressure of 3 mmHg, the estimated right ventricular systolic pressure is 38.0 mmHg. Left Atrium: Left atrial size was moderately dilated. Right Atrium: Right atrial size was normal in size. Pericardium: There is no evidence of pericardial effusion. Mitral Valve: The mitral valve is degenerative in appearance. There is mild thickening of the mitral valve leaflet(s). Moderate to severe mitral valve regurgitation, with posteriorly-directed jet. Tricuspid Valve: The tricuspid valve is grossly normal. Tricuspid valve regurgitation is mild to moderate. Aortic Valve: The aortic valve is tricuspid. There is mild calcification of the aortic valve. There is mild aortic valve annular calcification. Aortic valve regurgitation is trivial. No aortic stenosis is present. Pulmonic Valve: The pulmonic valve was grossly normal. Pulmonic valve regurgitation is trivial. Aorta: The aortic root and ascending aorta are structurally normal, with no evidence of dilitation. Venous: The inferior vena cava is normal in size with greater than 50% respiratory variability, suggesting right atrial pressure of 3 mmHg. IAS/Shunts: Evidence of atrial level shunting detected by color flow Doppler. Additional Comments: 3D was performed not requiring image post processing on an independent workstation and was indeterminate. A device lead is visualized.  LEFT VENTRICLE PLAX 2D LVIDd:         5.10 cm LVIDs:         4.40 cm LV PW:         1.40 cm 3D Volume EF  LV IVS:        1.00 cm LV 3D EDV:   123.82 ml                        LV 3D ESV:   48.23 ml RIGHT VENTRICLE          IVC RV Basal diam:  3.70 cm  IVC diam: 1.60 cm LEFT ATRIUM             Index        RIGHT ATRIUM           Index LA diam:        4.10 cm 2.46 cm/m   RA Area:     19.90 cm LA Vol (A2C):   58.0 ml 34.84 ml/m  RA Volume:  50.80 ml  30.52 ml/m LA Vol (A4C):   84.5 ml 50.76 ml/m LA Biplane Vol: 73.4 ml 44.10 ml/m  AORTIC VALVE LVOT Vmax:   75.95 cm/s LVOT Vmean:  46.700 cm/s LVOT VTI:    0.125 m  AORTA Ao Asc diam: 3.20 cm MITRAL VALVE               TRICUSPID VALVE MV Area (PHT): 3.12 cm    TR Peak grad:   35.0 mmHg MV Decel Time: 243 msec    TR Vmax:        296.00 cm/s MR Peak grad: 87.0 mmHg MR Mean grad: 62.0 mmHg    SHUNTS MR Vmax:      466.33 cm/s  Systemic VTI: 0.12 m MR Vmean:     367.0 cm/s MV E velocity: 70.60 cm/s MV A velocity: 30.70 cm/s MV E/A ratio:  2.30 Teddie Favre MD Electronically signed by Teddie Favre MD Signature Date/Time: 08/22/2023/11:28:01 AM    Final    CT CEREBRAL PERFUSION W CONTRAST Result Date: 08/21/2023 CLINICAL DATA:  Initial evaluation for acute visual disturbance. EXAM: CT PERFUSION BRAIN TECHNIQUE: Multiphase CT imaging of the brain was performed following IV bolus contrast injection. Subsequent parametric perfusion maps were calculated using RAPID software. RADIATION DOSE REDUCTION: This exam was performed according to the departmental dose-optimization program which includes automated exposure control, adjustment of the mA and/or kV according to patient size and/or use of iterative reconstruction technique. CONTRAST:  32mL OMNIPAQUE  IOHEXOL  350 MG/ML SOLN COMPARISON:  Prior CTs from earlier the same day. FINDINGS: CT Brain Perfusion Findings: CBF (<30%) Volume: 0mL Perfusion (Tmax>6.0s) volume: 0mL Mismatch Volume: 0mL ASPECTS on noncontrast CT Head: 10 at 5:43 p.m. today. Infarct Core: 0 mL Infarction Location:Negative CT perfusion for acute ischemia.  Mildly delayed perfusion of T-max greater than 4 seconds in the watershed zones of the left cerebral hemispheres compared to the right, presumably related to the occluded left ICA as seen on prior CTA. IMPRESSION: 1. Negative CT perfusion for acute ischemia. 2. Mildly delayed perfusion of T-max greater than 4 seconds in the watershed zones of the left cerebral hemisphere compared to the right, presumably related to the occluded left ICA as seen on prior CTA. Electronically Signed   By: Virgia Griffins M.D.   On: 08/21/2023 21:04   CT ANGIO HEAD NECK W WO CM Addendum Date: 08/21/2023 ADDENDUM REPORT: 08/21/2023 19:32 ADDENDUM: CTA neck impression #1 and CTA head impression #1 called by telephone on 08/21/2023 at 7:14 pm to provider Dr. Alecia Ames, who verbally acknowledged these results. Electronically Signed   By: Bascom Lily D.O.   On: 08/21/2023 19:32   Result Date: 08/21/2023 CLINICAL DATA:  Provided history: Neuro deficit, acute, stroke suspected. Left-sided headache, vision changes, dizziness. EXAM: CT ANGIOGRAPHY HEAD AND NECK WITH AND WITHOUT CONTRAST TECHNIQUE: Multidetector CT imaging of the head and neck was performed using the standard protocol during bolus administration of intravenous contrast. Multiplanar CT image reconstructions and MIPs were obtained to evaluate the vascular anatomy. Carotid stenosis measurements (when applicable) are obtained utilizing NASCET criteria, using the distal internal carotid diameter as the denominator. RADIATION DOSE REDUCTION: This exam was performed according to the departmental dose-optimization program which includes automated exposure control, adjustment of the mA and/or kV according to patient size and/or use of iterative reconstruction technique. CONTRAST:  60mL OMNIPAQUE  IOHEXOL  350 MG/ML SOLN COMPARISON:  Non-contrast head CT performed earlier today 08/21/2023. FINDINGS: CTA NECK FINDINGS Aortic arch: Standard aortic branching. Atherosclerotic plaque  within  the visualized aortic arch and proximal major branch vessels of the neck. No hemodynamically significant innominate or proximal subclavian artery stenosis. Right carotid system: CCA and ICA patent within the neck. Atherosclerotic plaque about the carotid bifurcation, within the proximal ICA and within the proximal ECA. Estimated 50% stenosis at the ICA origin. Severe stenosis of the proximal ECA. Left carotid system: The common carotid artery is patent within the neck. The internal carotid artery becomes occluded shortly beyond its origin and remains occluded throughout the remainder of the neck. Calcified atherosclerotic plaque at the ECA origin resulting in at least moderate stenosis. Vertebral arteries: Codominant and patent within the neck. Scattered atherosclerotic plaque within the cervical left vertebral artery. Most notably, calcified plaque within the proximal V3 segment results in up to moderate stenosis. Skeleton: Dextrocurvature of the cervical spine. Cervical spondylosis. No acute fracture or aggressive osseous lesion. Other neck: No neck mass or cervical lymphadenopathy. Upper chest: No consolidation within the imaged lung apices. Review of the MIP images confirms the above findings CTA HEAD FINDINGS Anterior circulation: The intracranial left internal carotid artery remains occluded to the paraclinoid level. Reconstitution of enhancement within the supraclinoid left ICA. The intracranial right internal carotid artery is patent. Calcified atherosclerotic plaque within this vessel with no more than mild stenosis. The M1 middle cerebral arteries are patent. Atherosclerotic irregularity of the M2 and more distal MCA branches. No M2 proximal branch occlusion or high-grade proximal stenosis. The anterior cerebral arteries are patent. Azygous configuration of the anterior cerebral arteries. Mildly hypoplastic left A1 segment. No intracranial aneurysm is identified. Posterior circulation: The  intracranial vertebral arteries are patent. The basilar artery is patent. The posterior cerebral arteries are patent. Atherosclerotic irregularity of both vessels. Most notably, there is a moderate/severe stenosis within the left PCA P2 segment. A left posterior communicating artery is present. The right posterior communicating artery is diminutive or absent. Venous sinuses: Within the limitations of contrast timing, no convincing thrombus. Anatomic variants: As described grade Review of the MIP images confirms the above findings Attempts are being made to reach the ordering provider at this time. IMPRESSION: CTA neck: 1. 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 This is age-indeterminate. 2. The right common and internal carotid arteries are patent within the neck. Estimated 50% atherosclerotic narrowing at the right ICA origin. 3. Vertebral arteries patent within the neck. Up to moderate atherosclerotic narrowing of the left vertebral artery V3 segment. 4. Aortic Atherosclerosis (ICD10-I70.0). CTA head: 1. 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 This is age-indeterminate. Reconstitution of enhancement within the supraclinoid left ICA. 2. No proximal intracranial large vessel occlusion identified elsewhere. 3. Background intracranial atherosclerotic disease as described. Most notably, there is a moderate/severe stenosis within the left PCA P2 segment. Electronically Signed: By: Bascom Lily D.O. On: 08/21/2023 19:04   CT HEAD CODE STROKE WO CONTRAST Result Date: 08/21/2023 CLINICAL DATA:  Code stroke. Neuro deficit, acute, stroke suspected. Dizziness. Eye pain on the left, headache, nausea. EXAM: CT HEAD WITHOUT CONTRAST TECHNIQUE: Contiguous axial images were obtained from the base of the skull through the vertex without intravenous contrast. RADIATION DOSE REDUCTION: This  exam was performed according to the departmental dose-optimization program which includes automated exposure control, adjustment of the mA and/or kV according to patient size and/or use of iterative reconstruction technique. COMPARISON:  Head CT 08/08/2023. FINDINGS: Brain: Generalized cerebral atrophy. Prominent perivascular spaces within the  right basal ganglia inferiorly. Small chronic infarct again demonstrated within the inferior right cerebellar hemisphere. There is no acute intracranial hemorrhage. No demarcated cortical infarct. No extra-axial fluid collection. No evidence of an intracranial mass. No midline shift. Vascular: No hyperdense vessel. Atherosclerotic calcifications. Skull: No calvarial fracture or aggressive osseous lesion. Sinuses/Orbits: No mass or acute finding within the imaged orbits. No significant paranasal sinus disease at the imaged levels. ASPECTS [facility] Stroke Program Early CT Score) - Ganglionic level infarction (caudate, lentiform nuclei, internal capsule, insula, M1-M3 cortex): 7 - Supraganglionic infarction (M4-M6 cortex): 3 Total score (0-10 with 10 being normal): 10 No evidence of an acute intracranial abnormality. These results were communicated to Dr. Alecia Ames at 5:55 pmon 6/10/2025by text page via the [facility] messaging system. IMPRESSION: 1.  No evidence of an acute intracranial abnormality. 2. 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 3. Cerebral atrophy. Electronically Signed   By: Bascom Lily D.O.   On: 08/21/2023 17:56   CT Cervical Spine Wo Contrast Result Date: 08/08/2023 CLINICAL DATA:  Neck trauma (Age >= 65y) EXAM: CT CERVICAL SPINE WITHOUT CONTRAST TECHNIQUE: Multidetector CT imaging of the cervical spine was performed without intravenous contrast. Multiplanar CT image reconstructions were also generated. RADIATION DOSE REDUCTION: This exam was performed according to the departmental dose-optimization program which  includes automated exposure control, adjustment of the mA and/or kV according to patient size and/or use of iterative reconstruction technique. COMPARISON:  None Available. FINDINGS: Alignment: Straightening of normal lordosis. No traumatic subluxation. Skull base and vertebrae: No acute fracture. Vertebral body heights are maintained. Schmorl's node within inferior C7. The dens and skull base are intact. Soft tissues and spinal canal: No prevertebral fluid or swelling. No visible canal hematoma. Disc levels: Disc space narrowing and anterior spurring C4-C5, C5-C6, C6-C7 and C7-T1. Upper chest: No acute findings. Biapical pleuroparenchymal scarring. Other: Carotid calcifications. IMPRESSION: 1. No acute fracture or subluxation of the cervical spine. 2. Multilevel degenerative disc disease. Electronically Signed   By: Chadwick Colonel M.D.   On: 08/08/2023 17:33   DG Elbow 2 Views Left Result Date: 08/08/2023 CLINICAL DATA:  Pain after fall. EXAM: LEFT ELBOW - 2 VIEW COMPARISON:  None Available. FINDINGS: There is no evidence of fracture or dislocation. Right elevated anterior fat pad but no posterior fat pad elevation. There is no evidence of arthropathy or other focal bone abnormality. Soft tissues are unremarkable. IMPRESSION: 1. Possible small joint effusion. 2. No evidence of fracture or dislocation. If symptoms persist, recommend follow-up radiographs in 7-10 days. Electronically Signed   By: Chadwick Colonel M.D.   On: 08/08/2023 17:26   CT Head Wo Contrast Result Date: 08/08/2023 CLINICAL DATA:  Head trauma, minor (Age >= 65y) EXAM: CT HEAD WITHOUT CONTRAST TECHNIQUE: Contiguous axial images were obtained from the base of the skull through the vertex without intravenous contrast. RADIATION DOSE REDUCTION: This exam was performed according to the departmental dose-optimization program which includes automated exposure control, adjustment of the mA and/or kV according to patient size and/or use of  iterative reconstruction technique. COMPARISON:  None Available. FINDINGS: Brain: No intracranial hemorrhage, mass effect, or midline shift. Generalized atrophy. No hydrocephalus. The basilar cisterns are patent. Perivascular space versus remote lacunar infarct in the right basal ganglia. Remote right cerebellar lacunar infarct. No evidence of territorial infarct or acute ischemia. No extra-axial or intracranial fluid collection. Vascular: Atherosclerosis of skullbase vasculature without hyperdense vessel or abnormal calcification. Skull: No fracture or focal lesion. Sinuses/Orbits: Paranasal sinuses and mastoid  air cells are clear. The visualized orbits are unremarkable. Other: No confluent scalp hematoma. IMPRESSION: 1. No acute intracranial abnormality. No skull fracture. 2. Generalized atrophy. Remote right cerebellar lacunar infarct. Electronically Signed   By: Chadwick Colonel M.D.   On: 08/08/2023 17:24   DG Chest 2 View Result Date: 07/31/2023 CLINICAL DATA:  Shortness of breath. EXAM: CHEST - 2 VIEW COMPARISON:  02/02/2016. FINDINGS: Bilateral lung fields are clear. Bilateral costophrenic angles are clear. Normal cardio-mediastinal silhouette. There is a left sided 3-lead pacemaker. No acute osseous abnormalities. The soft tissues are within normal limits. IMPRESSION: *No active cardiopulmonary disease. Electronically Signed   By: [name] M.D.   On: 07/31/2023 14:19     PHYSICAL EXAM  Temp:  [97.6 F (36.4 C)-97.9 F (36.6 C)] 97.9 F (36.6 C) (06/16 0715) Pulse Rate:  [61-74] 61 (06/16 0715) Resp:  [15-19] 18 (06/16 0715) BP: (121-151)/(62-99) 151/88 (06/16 0715) SpO2:  [99 %-100 %] 100 % (06/16 0715) Weight:  [55.9 kg] 55.9 kg (06/16 0715)  General - Well nourished, well developed pleasant elderly Caucasian male, in no apparent distress.  Ophthalmologic - fundi not visualized due to noncooperation.  Cardiovascular - Regular rhythm and rate.  Mental Status -  Level of  arousal and orientation to time, place, and person were intact. Language including expression, naming, repetition, comprehension was assessed and found intact. Fund of Knowledge was assessed and was intact.  Cranial Nerves II - XII - II - Visual field intact OU. III, IV, VI - Extraocular movements intact. V - Facial sensation intact bilaterally. VII - Facial movement intact bilaterally. VIII - Hearing & vestibular intact bilaterally. X - Palate elevates symmetrically. XI - Chin turning & shoulder shrug intact bilaterally. XII - Tongue protrusion intact.  Motor Strength - The patient's strength was normal in all extremities and pronator drift was absent.  Bulk was normal and fasciculations were absent.   Motor Tone - Muscle tone was assessed at the neck and appendages and was normal.  Reflexes - The patient's reflexes were symmetrical in all extremities and he had no pathological reflexes.  Sensory - Light touch, temperature/pinprick were assessed and were symmetrical.    Coordination - The patient had normal movements in the hands and feet with no ataxia or dysmetria.  Tremor was absent.  Gait and Station - deferred.   ASSESSMENT/PLAN [name]. is a 78 y.o. male with history of CHF, PAF on Xarelto , RA, AICD, CKD 3, hypertension, CAD, recent fall admitted for headache, dizziness, nausea vomiting, transient left eye vision loss. No TNK given due to symptom resolved.    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  6/10 acute onset pain radiating from lower jaw to the frontal head on the left with left painless vision loss.  The pain gradually eased off in 2 to 3 hours, left vision loss resumed  within 3 to 4 hours. 6/14 stroke code due to new aphasia. Symptoms resolved later, MRI negative for acute stroke but angiogram showed new M3 occlusion.  CT no acute abnormality, old right cerebellar infarct CT head and neck left ICA occlusion, reconstituted at siphon.  Right ICA 50% stenosis,  left P2 moderate to severe stenosis CT perfusion negative MRI no acute infarct Cerebral catheter angiogram 08/27/2023 shows interval recanalization of the left M3 and carotid occlusion. 2D Echo EF 35 to 40% LDL 54 HgbA1c 5.6 UDS negative Xarelto  15 for VTE prophylaxis Xarelto  (rivaroxaban ) daily prior to admission, now recommend switching to Eliquis and aspirin  81 mg daily into 3 months  and then Eliquis alone Ongoing aggressive stroke risk factor management Therapy recommendations: Outpatient PT Disposition: Pending  PAF on [facility] Xarelto  15 Compliant with medication Currently on heparin  IV in preparation for cerebral angiogram  CHF 01/2023 EF 30 to 35% 07/31/2023 admitted for acute on chronic heart failure This admission EF 35 to 40% On Lasix  40 daily  Hypertension Stable Avoid low BP given left ICA occlusion Long term BP goal normotensive  Hyperlipidemia Home meds: Crestor  5 LDL 54, goal < 70 Now on Crestor  5 No high intensity statin given LDL at goal Continue statin at discharge  Other Stroke Risk Factors Advanced age Coronary artery disease  Other Active Problems AICD placement Rheumatoid arthritis CKD 3, creatinine 1.84--2.12--1.83 Recent diarrhea  Hospital day # 3 I have personally obtained history,examined this patient, reviewed notes, independently viewed imaging studies, participated in medical decision making and plan of care.ROS completed by me personally and pertinent positives fully documented  I have made any additions or clarifications directly to the above note. Agree with note above.  Patient presented with emesis for few jacks following a fall with likely  traumatic dissection and occlusion of left ICA in the neck but after has been started on anticoagulation and antiplatelet therapy developed interval revascularization of the left ICA on CT angio on 08/25/2023 but developed a new left M3 occlusion but clinically his deficits have resolved.  Diagnostic catheter angiogram today shows no significant dissection or occlusion or endovascular treatable lesion.  Patient has developed significant hematoma in ecchymosis and bruising from IV heparin  and Brilinta and aspirin  hence will recommend discontinuing heparin  and switching anticoagulation to Eliquis twice daily along with aspirin  for 3 months and then Eliquis alone for his atrial fibrillation.  Long discussion with patient and wife and answered questions.  Recommend monitoring patient in the ICU overnight and if stable transfer out of ICU tomorrow and discharge in the next few days.  Discussed with Dr. Marygrace Snellen critical care medicine and Dr. Alvira Josephs.  Greater than 50% time during this 50-minute visit was spent in counseling and coordination of care and discussion with patient and wife and care team and answering questions.  Ardella Beaver, MD Medical Director [facility] Pager: [phone] 08/27/2023 1:59 PM  08/27/2023 9:19 AM    To contact Stroke Continuity provider, please refer to [email or web address]. After hours, contact General Neurology

## 2023-08-27 NOTE — Anesthesia Procedure Notes (Signed)
 Procedure Name: Intubation Date/Time: 08/27/2023 9:11 AM  Performed by: Johann Muta, CRNAPre-anesthesia Checklist: Patient identified, Emergency Drugs available, Suction available and Patient being monitored Patient Re-evaluated:Patient Re-evaluated prior to induction Oxygen Delivery Method: Circle System Utilized Preoxygenation: Pre-oxygenation with 100% oxygen Induction Type: IV induction Ventilation: Mask ventilation without difficulty Laryngoscope Size: Mac and 4 Grade View: Grade I Tube type: Oral Tube size: 7.5 mm Number of attempts: 1 Airway Equipment and Method: Stylet and Oral airway Placement Confirmation: ETT inserted through vocal cords under direct vision, positive ETCO2 and breath sounds checked- equal and bilateral Secured at: 22 cm Tube secured with: Tape Dental Injury: Teeth and Oropharynx as per pre-operative assessment

## 2023-08-27 NOTE — Plan of Care (Signed)
  Problem: Health Behavior/Discharge Planning: Goal: Ability to manage health-related needs will improve Outcome: Progressing   Problem: Clinical Measurements: Goal: Will remain free from infection Outcome: Progressing   Problem: Coping: Goal: Level of anxiety will decrease Outcome: Progressing   Problem: Skin Integrity: Goal: Risk for impaired skin integrity will decrease Outcome: Progressing   Problem: Education: Goal: Knowledge of secondary prevention will improve (MUST DOCUMENT ALL) Outcome: Progressing

## 2023-08-27 NOTE — Progress Notes (Addendum)
 PHARMACY - ANTICOAGULATION CONSULT NOTE  Pharmacy Consult for Heparin  > apixaban Indication: stroke (hx afib and history DVT - chronic Xarelto )  Allergies  Allergen Reactions   Amiodarone Other (See Comments)    Pt reports vision changes and walking changes    Flecainide  Other (See Comments)    Severe headaches, blurred vision   Carvedilol  Other (See Comments)    Unknown    Dapagliflozin Diarrhea   Eplerenone Other (See Comments)    Unknown    Flomax [Tamsulosin Hcl] Hives, Itching and Rash   Lisinopril  Nausea And Vomiting   Metoprolol  Other (See Comments)    Headaches, dizziness   Sacubitril-Valsartan Nausea And Vomiting   Spironolactone  Swelling and Other (See Comments)    Breast swelling, headaches   Statins Other (See Comments)    Leg cramps   Sulfasalazine Nausea Only    Patient Measurements: Height: 5' 8 (172.7 cm) Weight: 55.9 kg (123 lb 3.8 oz) IBW/kg (Calculated) : 68.4 HEPARIN  DW (KG): 55.9  Vital Signs: Temp: 97.8 F (36.6 C) (06/16 1215) Temp Source: Oral (06/16 0715) BP: 126/60 (06/16 1230) Pulse Rate: 70 (06/16 1230)  Labs: Recent Labs     0000 08/24/23 1837 08/25/23 0644 08/25/23 1608 08/25/23 1929 08/26/23 1134 08/26/23 2343 08/27/23 0534 08/27/23 0847  HGB   < >  --   --  13.8  --  12.2*  --  12.1*  --   HCT  --   --   --  40.7  --  36.6*  --  35.0*  --   PLT  --   --   --  197  --  245  --  237  --   APTT  --  59* 110*  --   --   --   --   --   --   LABPROT  --   --   --   --   --   --   --   --  14.3  INR  --   --   --   --   --   --   --   --  1.1  HEPARINUNFRC  --  0.44 0.84*  --    < > 0.58 0.40 0.48  --   CREATININE  --   --  2.24*  --   --  2.00*  --  2.14*  --    < > = values in this interval not displayed.    Estimated Creatinine Clearance: 23.6 mL/min (A) (by C-G formula based on SCr of 2.14 mg/dL (H)).   Medical History: Past Medical History:  Diagnosis Date   A-fib [facility])    AICD (automatic cardioverter/defibrillator)  present    Barrett's esophagus 2011   In [place] . Records in Epic   CHF (congestive heart failure) (HCC)    Chronic kidney disease    Coronary atherosclerosis of native coronary artery    Nonobstructive   Essential hypertension, benign    GERD (gastroesophageal reflux disease)    Heart block AV first degree    Chronic   History of kidney stones    Hypercholesterolemia    Mixed hyperlipidemia    NSVT (nonsustained ventricular tachycardia) (HCC)    Holter monitor, 2011; recommended RF ablation by Dr. Nunzio Belch - never required   PAF (paroxysmal atrial fibrillation) [facility])    Recently documented in [facility] June 2016   Pre-diabetes    Rheumatoid arthritis (HCC)    Sinus bradycardia    Symptomatic, acebutolol discontinued  Skin cancer    Sleep apnea    pt denies    Medications:  Scheduled:    stroke: early stages of recovery book   Does not apply Once   aspirin   81 mg Oral Daily   Or   aspirin   81 mg Per Tube Daily   fluorescein   1 strip Both Eyes Once   leptospermum manuka honey  1 Application Topical Daily   mexiletine  150 mg Oral BID   pantoprazole   40 mg Oral QPM   rosuvastatin   5 mg Oral Q Sun   sodium chloride  flush  3-10 mL Intravenous Q12H   tetracaine   1 drop Both Eyes Once   ticagrelor  90 mg Oral BID   ticagrelor  90 mg Oral BID   Or   ticagrelor  90 mg Per Tube BID   PRN: acetaminophen  **OR** acetaminophen  (TYLENOL ) oral liquid 160 mg/5 mL **OR** acetaminophen , hydrALAZINE , senna-docusate  Assessment: 78 yo male on chronic Xarelto  for hx afib and history of R subclavian DVT  now with 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 Pharmacy consulted to transition to apixaban from heparin  drip for Afib.  Patient was on triple therapy with heparin  infusion, ticagrelor, and aspirin . Per Dr. Janett Medin, will consolidate to apixaban and aspirin  at this time. Patient with extensive bruising on arms from blood draws while on triple therapy. Hgb stable and plts  wnl. Some oozing from IV sites upon bedside exam.   Goal of Therapy:  Monitor platelets by anticoagulation protocol: Yes   Plan:  Stop heparin  infusion Start apixaban 2.5 mg twice daily (reduced due to weight, kidney function) Monitor for signs/symptoms of bleeding, kidney function improvement  Valarie Garner, PharmD PGY1 Pharmacy Resident  Please check AMION for all [facility] Pharmacy phone numbers After 10:00 PM, call Main Pharmacy [phone] 08/27/2023 1:34 PM

## 2023-08-27 NOTE — Anesthesia Postprocedure Evaluation (Signed)
 Anesthesia Post Note  Patient: Brent Ray.  Procedure(s) Performed: RADIOLOGY WITH ANESTHESIA     Patient location during evaluation: PACU Anesthesia Type: General Level of consciousness: awake and alert, patient cooperative and oriented Pain management: pain level controlled Vital Signs Assessment: post-procedure vital signs reviewed and stable Respiratory status: spontaneous breathing, nonlabored ventilation and respiratory function stable Cardiovascular status: stable (requiring Cleviprex BP control) Postop Assessment: no apparent nausea or vomiting Anesthetic complications: no   No notable events documented.  Last Vitals:  Vitals:   08/27/23 1100 08/27/23 1115  BP: (!) 114/56 123/60  Pulse: 70 69  Resp: 13 11  Temp:    SpO2: 100% 100%    Last Pain:  Vitals:   08/27/23 1145  TempSrc:   PainSc: 0-No pain                 Pelham Hennick,E. Shariece Viveiros

## 2023-08-28 DIAGNOSIS — W19XXXA Unspecified fall, initial encounter: Secondary | ICD-10-CM | POA: Diagnosis not present

## 2023-08-28 DIAGNOSIS — I480 Paroxysmal atrial fibrillation: Secondary | ICD-10-CM | POA: Diagnosis not present

## 2023-08-28 DIAGNOSIS — I7771 Dissection of carotid artery: Secondary | ICD-10-CM | POA: Diagnosis not present

## 2023-08-28 DIAGNOSIS — G453 Amaurosis fugax: Secondary | ICD-10-CM | POA: Diagnosis not present

## 2023-08-28 MED ORDER — ORAL CARE MOUTH RINSE
15.0000 mL | OROMUCOSAL | Status: DC | PRN
Start: 2023-08-28 — End: 2023-08-29

## 2023-08-28 MED ORDER — OXIDIZED CELLULOSE EX PADS
1.0000 | MEDICATED_PAD | Freq: Once | CUTANEOUS | Status: AC
  Administered 2023-08-28: 1 via TOPICAL
  Filled 2023-08-28: qty 1

## 2023-08-28 NOTE — Progress Notes (Signed)
 STROKE TEAM PROGRESS NOTE   SUBJECTIVE (INTERVAL HISTORY)  Patient lying in bed comfortably.  Neurological exam is stable without focal symptoms.   He continued to have hematoma in the right wrist at the angio site and required pressure to stop it. Vital signs stable.   OBJECTIVE Temp:  [97.9 F (36.6 C)-98.2 F (36.8 C)] 98.1 F (36.7 C) (06/17 0800) Pulse Rate:  [65-77] 77 (06/17 1300) Cardiac Rhythm: Ventricular paced (06/17 0800) Resp:  [10-27] 10 (06/17 1300) BP: (108-154)/(55-83) 133/60 (06/17 1300) SpO2:  [95 %-100 %] 98 % (06/17 1300) Arterial Line BP: (109-184)/(46-99) 128/62 (06/17 0300) Weight:  [55.5 kg] 55.5 kg (06/17 0500)  Recent Labs  Lab 08/25/23 2019  GLUCAP 82   Recent Labs  Lab 08/21/23 1722 08/23/23 0736 08/25/23 0644 08/26/23 1134 08/27/23 0534  NA 140 139 138 135 138  K 3.4* 3.7 3.3* 3.7 3.7  CL 114* 111 107 106 111  CO2 15* 17* 19* 17* 19*  GLUCOSE 128* 88 96 91 97  BUN 32* 21 23 24* 25*  CREATININE 2.12* 1.83* 2.24* 2.00* 2.14*  CALCIUM  8.8* 8.6* 9.0 8.8* 8.7*  MG  --  1.9 1.9 1.8 2.0  PHOS  --   --  3.3 2.6 3.0   Recent Labs  Lab 08/21/23 1722  AST 37  ALT 33  ALKPHOS 57  BILITOT 1.3*  PROT 6.4*  ALBUMIN  3.5   Recent Labs  Lab 08/21/23 1722 08/24/23 0647 08/25/23 1608 08/26/23 1134 08/27/23 0534  WBC 7.0 4.8 8.5 8.1 6.6  NEUTROABS 5.7  --   --   --   --   HGB 12.4* 12.5* 13.8 12.2* 12.1*  HCT 36.5* 37.5* 40.7 36.6* 35.0*  MCV 101.1* 98.4 98.8 100.0 99.4  PLT 264 222 197 245 237   No results for input(s): CKTOTAL, CKMB, CKMBINDEX, TROPONINI in the last 168 hours. Recent Labs    08/27/23 0847  LABPROT 14.3  INR 1.1   No results for input(s): COLORURINE, LABSPEC, PHURINE, GLUCOSEU, HGBUR, BILIRUBINUR, KETONESUR, PROTEINUR, UROBILINOGEN, NITRITE, LEUKOCYTESUR in the last 72 hours.  Invalid input(s): APPERANCEUR     Component Value Date/Time   CHOL 125 08/22/2023 0451   TRIG 43  08/22/2023 0451   HDL 62 08/22/2023 0451   CHOLHDL 2.0 08/22/2023 0451   VLDL 9 08/22/2023 0451   LDLCALC 54 08/22/2023 0451   Lab Results  Component Value Date   HGBA1C 5.6 08/21/2023      Component Value Date/Time   LABOPIA NONE DETECTED 08/21/2023 1956   COCAINSCRNUR NONE DETECTED 08/21/2023 1956   LABBENZ NONE DETECTED 08/21/2023 1956   AMPHETMU NONE DETECTED 08/21/2023 1956   THCU NONE DETECTED 08/21/2023 1956   LABBARB NONE DETECTED 08/21/2023 1956    Recent Labs  Lab 08/21/23 1722  ETH <15    I have personally reviewed the radiological images below and agree with the radiology interpretations.  IR ANGIO INTRA EXTRACRAN SEL COM CAROTID INNOMINATE BILAT MOD SED Result Date: 08/28/2023 CLINICAL DATA:  Transient effusion, right-sided weakness, and left-sided amaurosis fugax. 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 EXAM: BILATERAL COMMON CAROTID AND INNOMINATE ANGIOGRAPHY COMPARISON:  CT angiogram of the head and neck June 10, and August 25, 2023. MEDICATIONS: Heparin  2000 units IV; Ancef  2 g IV antibiotic was administered within 1 hour of the procedure. ANESTHESIA/SEDATION: General anesthesia. CONTRAST:  Omnipaque  300 approximately 60 mL. FLUOROSCOPY TIME:  Fluoroscopy Time: 6 minutes 36 seconds (380 mGy). COMPLICATIONS: None immediate. TECHNIQUE: Informed written consent was obtained  from the patient after a thorough discussion of the procedural risks, benefits and alternatives. All questions were addressed. Maximal Sterile Barrier Technique was utilized including caps, mask, sterile gowns, sterile gloves, sterile drape, hand hygiene and skin antiseptic. A timeout was performed prior to the initiation of the procedure. The right groin was prepped and draped in the usual sterile fashion. Thereafter using modified Seldinger technique, transfemoral access into the right common femoral artery was obtained without difficulty. Over an 0.035 inch guidewire, an 8 French 25 cm  Pinnacle sheath was inserted. Through this, and also over an 0.035 inch guidewire, a 5 [place] JB 1 catheter was advanced to the aortic arch region and selectively positioned in the right common carotid artery, the innominate artery, the right common carotid artery, the left common carotid artery and the left vertebral artery. FINDINGS: The innominate arteriogram demonstrates right subclavian artery and the proximal right common carotid artery to be widely patent. The right vertebral artery origin is widely patent. The vessel opacifies to the cranial skull base. Patency is seen of the right posterior-inferior cerebellar artery and the right vertebrobasilar junction. The opacified portions of the basilar artery, the posterior cerebral arteries and the superior cerebellar arteries on the lateral projection are grossly patent. The right common carotid arteriogram demonstrates the right external carotid artery and its major branches to be widely patent. Right internal carotid artery at the bulb demonstrates a smooth atherosclerotic plaque along the lateral posterior aspect of the carotid bulb without evidence of intraluminal filling defects. No acute ulcerations are seen. Distal to this, the right ICA opacifies to the cranial skull base. A focal area of mild narrowing interspersed with an area of small focal outpouchings without intrinsic stenosis is seen involving the mid cervical ICA. The petrous, the cavernous and the supraclinoid right ICA are widely patent. The right middle cerebral artery and the right anterior cerebral artery opacify into the capillary and venous phases. Suggestion of a dominant right anterior cerebral A2 segment is seen with both the pericallosal arteries into the capillary and venous phases. The left common carotid arteriogram demonstrates the left external carotid artery to be mildly narrowed at its origin. Its branches opacify normally. The left internal carotid artery at the bulb to the  cranial skull base is widely patent. 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 No evidence of intraluminal filling defects or intimal flaps is evident. At the level of the proximal cavernous and caval segment there is an ill-defined area of faint lucency which probably represents an atherosclerotic plaque along the medial aspect best seen on the magnified oblique images, and corresponds to plaque seen on the CT angiogram of the head and neck. The supraclinoid left ICA is widely patent. The left middle cerebral artery opacifies into the capillary and venous phases. The right anterior A1 segment is patent with flow noted into the hypoplastic right anterior cerebral A2 segment as described earlier. The left vertebral artery origin is widely patent. The vessel opacifies normally to the cranial skull base. Patency is seen of the left vertebrobasilar junction and the left posterior-inferior cerebellar artery. The opacified portion of the basilar artery, the posterior cerebral arteries, the superior cerebellar arteries and the anterior-inferior cerebellar arteries demonstrate wide patency into the capillary and venous phases. Unopacified blood is seen in the basilar artery from the contralateral vertebral artery. 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 Mildly decompressed left ICA extra cranially. PLAN: Findings reviewed with patient and spouse. Findings relayed to the patient's stroke neurologist also. Follow-up as per stroke neurologist. Electronically Signed   By: Luellen Sages M.D.    On: 08/28/2023 08:26   IR ANGIO VERTEBRAL SEL VERTEBRAL UNI L MOD SED Result Date: 08/28/2023 CLINICAL DATA:  Transient effusion, right-sided weakness, and left-sided amaurosis fugax. 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 EXAM: BILATERAL COMMON CAROTID AND INNOMINATE ANGIOGRAPHY COMPARISON:  CT angiogram of the head and neck June 10, and August 25, 2023. MEDICATIONS: Heparin  2000 units IV; Ancef  2 g IV antibiotic was administered within 1 hour of the procedure. ANESTHESIA/SEDATION: General anesthesia. CONTRAST:  Omnipaque  300 approximately 60 mL. FLUOROSCOPY TIME:  Fluoroscopy Time: 6 minutes 36 seconds (380 mGy). COMPLICATIONS: None immediate. TECHNIQUE: Informed written consent was obtained from the patient after a thorough discussion of the procedural risks, benefits and alternatives. All questions were addressed. Maximal Sterile Barrier Technique was utilized including caps, mask, sterile gowns, sterile gloves, sterile drape, hand hygiene and skin antiseptic. A timeout was performed prior to the initiation of the procedure. The right groin was prepped and draped in the usual sterile fashion. Thereafter using modified Seldinger technique, transfemoral access into the right common femoral artery was obtained without difficulty. Over an 0.035 inch guidewire, an 8 French 25 cm Pinnacle sheath was inserted. Through this, and also over an 0.035 inch guidewire, a 5 [place] JB 1 catheter was advanced to the aortic arch region and selectively positioned in the right common carotid artery, the innominate artery, the right common carotid artery, the left common carotid artery and the left vertebral artery. FINDINGS: The innominate arteriogram demonstrates right subclavian artery and the proximal right common carotid artery to be widely patent. The right vertebral artery origin is widely patent. The vessel opacifies to the cranial skull base. Patency is seen of the right posterior-inferior cerebellar artery  and the right vertebrobasilar junction. The opacified portions of the basilar artery, the posterior cerebral arteries and the superior cerebellar arteries on the lateral projection are grossly patent. The right common carotid arteriogram demonstrates the right external carotid artery and its major branches to be widely patent. Right internal carotid artery at the bulb demonstrates a smooth atherosclerotic plaque along the lateral posterior aspect of the carotid bulb without evidence of intraluminal filling defects. No acute ulcerations are seen. Distal to this, the right ICA opacifies to the cranial skull base. A focal area of mild narrowing interspersed with an area of small focal outpouchings without intrinsic stenosis is seen involving the mid cervical ICA. The petrous, the cavernous and the supraclinoid right ICA are widely patent. The right middle cerebral artery and the right anterior cerebral artery opacify into the capillary and venous phases. Suggestion of a dominant right anterior cerebral A2 segment is seen with both the pericallosal arteries into the capillary and venous phases. The left common carotid arteriogram demonstrates the left external carotid artery to be mildly narrowed at its origin. Its branches opacify normally. The left internal carotid artery at the bulb to the cranial skull base is widely patent. 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 No evidence of intraluminal  filling defects or intimal flaps is evident. At the level of the proximal cavernous and caval segment there is an ill-defined area of faint lucency which probably represents an atherosclerotic plaque along the medial aspect best seen on the magnified oblique images, and corresponds to plaque seen on the CT angiogram of the head and neck. The supraclinoid left ICA is widely  patent. The left middle cerebral artery opacifies into the capillary and venous phases. The right anterior A1 segment is patent with flow noted into the hypoplastic right anterior cerebral A2 segment as described earlier. The left vertebral artery origin is widely patent. The vessel opacifies normally to the cranial skull base. Patency is seen of the left vertebrobasilar junction and the left posterior-inferior cerebellar artery. The opacified portion of the basilar artery, the posterior cerebral arteries, the superior cerebellar arteries and the anterior-inferior cerebellar arteries demonstrate wide patency into the capillary and venous phases. Unopacified blood is seen in the basilar artery from the contralateral vertebral artery. 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 Mildly decompressed left ICA extra cranially. PLAN: Findings reviewed with patient and spouse. Findings relayed to the patient's stroke neurologist also. Follow-up as per stroke neurologist. Electronically Signed   By: Luellen Sages M.D.   On: 08/28/2023 08:26   DG Chest Port 1 View Result Date: 08/26/2023 CLINICAL DATA:  Shortness of breath. EXAM: PORTABLE CHEST 1 VIEW COMPARISON:  07/31/2023 FINDINGS: The lungs are clear without focal pneumonia, edema, pneumothorax or pleural effusion. Cardiopericardial silhouette is at upper limits of normal for size. Left-sided permanent pacemaker/AICD again noted. Telemetry leads overlie the chest. No acute bony abnormality. IMPRESSION: No active disease. Electronically Signed   By: Donnal Fusi M.D.   On: 08/26/2023 06:43   CT ANGIO HEAD NECK W WO CM W PERF (CODE STROKE) Result Date: 08/25/2023 CLINICAL DATA:  Initial  evaluation for acute neuro deficit, stroke suspected, right-sided weakness. Aphasia. EXAM: CT ANGIOGRAPHY HEAD AND NECK CT PERFUSION BRAIN TECHNIQUE: Multidetector CT imaging of the head and neck was performed using the standard protocol during bolus administration of intravenous contrast. Multiplanar CT image reconstructions and MIPs were obtained to evaluate the vascular anatomy. Carotid stenosis measurements (when applicable) are obtained utilizing NASCET criteria, using the distal internal carotid diameter as the denominator. Multiphase CT imaging of the brain was performed following IV bolus contrast injection. Subsequent parametric perfusion maps were calculated using RAPID software. RADIATION DOSE REDUCTION: This exam was performed according to the departmental dose-optimization program which includes automated exposure control, adjustment of the mA and/or kV according to patient size and/or use of iterative reconstruction technique. CONTRAST:  OMNIPAQUE  IOHEXOL  350 MG/ML SOLN COMPARISON:  CT from earlier the same day as well as prior studies from 08/21/2023. FINDINGS: CTA NECK FINDINGS Aortic arch: Visualized aortic arch within normal limits for caliber standard 3 vessel morphology. Aortic atherosclerosis. No significant stenosis about the origin the great vessels. Right carotid system: Right common and internal carotid arteries are patent without dissection. Moderate calcified plaque about the right carotid bulb without hemodynamically significant greater than 50% stenosis. Minimal irregularity about the mid cervical right ICA noted, favored atherosclerotic in nature. Left carotid system: Left common carotid artery remains patent to the bifurcation without stenosis. Atheromatous change about the left carotid bifurcation without hemodynamically significant stenosis. Previously seen left ICA occlusion  is no longer seen, with interval revascularization of the left ICA in the neck. Left ICA remains patent  the skull base without significant stenosis or evidence for dissection. Vertebral arteries: Both vertebral arteries arise from subclavian arteries. No proximal subclavian artery stenosis. Evaluation of vertebral artery somewhat limited by adjacent venous contamination. Tubal arteries M cells are patent without stenosis or dissection. Skeleton: No discrete or worrisome osseous lesions. Moderate cervical spondylosis. Other neck: No other acute finding. Upper chest: Streak artifact from a left-sided pacemaker/AICD. No other acute finding. Review of the MIP images confirms the above findings CTA HEAD FINDINGS Anterior circulation: Atheromatous change about the carotid siphons bilaterally. No significant stenosis on the right. The left ICA is now patent to the terminus. Focal filling defect within the cavernous left ICA at the level of the anterior genu consistent with small volume thrombus (series 5, image 133). This likely reflects a small amount of migrated clot from the neck below given prior left ICA occlusion. A1 segments patent bilaterally. Normal anterior communicating artery complex. Azygous ACA widely patent without stenosis. No M1 stenosis or occlusion. Right MCA branches patent and well perfused. On the left, there is a distal left M3 occlusion, likely embolic (a series 5, image 99). Remainder of the left MCA branches remain patent. Posterior circulation: Both V4 segments patent without stenosis. Both PICA patent at their origins. Basilar patent without stenosis. Superior cerebral arteries patent bilaterally. Atheromatous irregularity about the PCAs with associated mild to moderate bilateral P2 stenoses. PCAs remain patent to their distal aspects. Venous sinuses: Grossly patent allowing for timing the contrast bolus. Anatomic variants: Azygous ACA.  No aneurysm. Review of the MIP images confirms the above findings CT Brain Perfusion Findings: ASPECTS: 10 CBF (<30%) Volume: 0mL Perfusion (Tmax>6.0s) volume:  20mL Mismatch Volume: 20mL Infarction Location:Negative CT perfusion for acute core infarct. 20 mL area of delayed perfusion at the mid-posterior left frontal lobe, in keeping with the distal left M3 occlusion. No other perfusion abnormality. IMPRESSION: 1. Interval revascularization of the left ICA in the neck, with the left ICA now patent to the terminus. Focal filling defect within the cavernous left ICA at the level of the anterior genu consistent with small volume thrombus, likely migrated clot from the neck below given prior left ICA occlusion. Secondary severe stenosis at this level. 2. Downstream acute distal left M3 occlusion, likely embolic. 3. Negative CT perfusion for acute core infarct. 20 mL area of delayed perfusion at the mid-posterior left frontal lobe, in keeping with the left M3 occlusion. 4. Atheromatous change about the carotid bifurcations and carotid siphons without hemodynamically significant stenosis. 5.  Aortic Atherosclerosis (ICD10-I70.0). Results were discussed by telephone at the time of interpretation on 08/25/2023 at 9:10 p.m. to provider [facility] , who verbally acknowledged these results. Electronically Signed   By: Virgia Griffins M.D.   On: 08/25/2023 21:55   CT HEAD CODE STROKE WO CONTRAST Result Date: 08/25/2023 CLINICAL DATA:  Code stroke. Initial evaluation for acute neuro deficit, stroke suspected. EXAM: CT HEAD WITHOUT CONTRAST TECHNIQUE: Contiguous axial images were obtained from the base of the skull through the vertex without intravenous contrast. RADIATION DOSE REDUCTION: This exam was performed according to the departmental dose-optimization program which includes automated exposure control, adjustment of the mA and/or kV according to patient size and/or use of iterative reconstruction technique. COMPARISON:  Prior studies from 08/21/2023 FINDINGS: Brain: Mild age-related cerebral atrophy with chronic small vessel ischemic disease. Small remote right  cerebellar infarct. No acute intracranial  hemorrhage. No acute large vessel territory infarct. No mass lesion or midline shift. No hydrocephalus or extra-axial fluid collection. Vascular: No abnormal hyperdense vessel. Calcified atherosclerosis present at the skull base. Skull: Scalp soft tissues within normal limits.  Calvarium intact. Sinuses/Orbits: Globes orbital soft tissues within normal limits. Paranasal sinuses are clear. No mastoid effusion. Other: None. ASPECTS [facility] Stroke Program Early CT Score) - Ganglionic level infarction (caudate, lentiform nuclei, internal capsule, insula, M1-M3 cortex): 7 - Supraganglionic infarction (M4-M6 cortex): 3 Total score (0-10 with 10 being normal): 10 IMPRESSION: 1. No acute intracranial abnormality. 2. ASPECTS is 10. 3. 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 These results were communicated to Dr. Arora at 8:52 pm on 08/25/2023 by text page via the [facility] messaging system. Electronically Signed   By: Virgia Griffins M.D.   On: 08/25/2023 20:55   MR BRAIN WO CONTRAST Result Date: 08/23/2023 CLINICAL DATA:  Provided history: Vision changes. Headache. Dizziness. EXAM: MRI HEAD WITHOUT CONTRAST TECHNIQUE: Multiplanar, multiecho pulse sequences of the brain and surrounding structures were obtained without intravenous contrast. COMPARISON:  Non-contrast head CT, CT angiogram head/neck and CT perfusion head 08/21/2023. FINDINGS: Brain: Mild generalized cerebral atrophy. 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 Multifocal T2 FLAIR hyperintense signal abnormality within the cerebral white matter, nonspecific but compatible with moderate chronic small vessel ischemic disease. Mild chronic small vessel ischemic changes within the pons. Small T2 hyperintense focus within the right caudate head likely reflecting a prominent perivascular space.  Prominent perivascular space also present within the right basal ganglia inferiorly. 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 Nonspecific punctate chronic microhemorrhage within medial the right cerebellar hemisphere. No evidence of an intracranial mass. No extra-axial fluid collection. No midline shift. 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 Skull and upper cervical spine: No focal worrisome marrow lesion. Sinuses/Orbits: No mass or acute finding within the imaged orbits. Prior but ocular lens replacement. No significant paranasal sinus disease. IMPRESSION: 1. 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 2. Known occlusion of the left internal carotid artery. 3. Chronic small vessel ischemic changes which are moderate in the cerebral white matter and mild in the pons. 4. Small chronic infarct within the right cerebellar hemisphere. 5. Mild generalized cerebral atrophy. Electronically Signed   By: Bascom Lily D.O.   On: 08/23/2023 13:22   CUP PACEART REMOTE DEVICE CHECK Result Date: 08/22/2023 ICD Scheduled remote reviewed. Normal device function.  Presenting rhythm: BIV paced HF diagnostics abnormal this monitoring period. BIV pacing at 91%; stable. No new events since 4/4 programmer session, prior to that 6 NSVT events, longest x 4 sec; V rates >200 bpm.  Likely ventricular driven. Next remote 91 days. [place], [facility]  ECHOCARDIOGRAM COMPLETE Result Date: 08/22/2023    ECHOCARDIOGRAM REPORT   Patient Name:   Tareek Sabo. Date of Exam: 08/22/2023 Medical Rec #:  [id number]            Height:       68.0 in Accession #:    [id number]           Weight:       123.4 lb Date of Birth:  10-11-1945            BSA:  1.665 m Patient Age:    77 years             BP:           142/87 mmHg Patient Gender: M                    HR:           83 bpm.  Exam Location:  [facility] Procedure: 2D Echo, Cardiac Doppler, Color Doppler and Strain Analysis (Both            Spectral and Color Flow Doppler were utilized during procedure). Indications:    stroke I63.9  History:        Patient has prior history of Echocardiogram examinations, most                 recent 01/29/2023. CHF, CAD; Risk Factors:Hypertension and                 Dyslipidemia.  Sonographer:    Kip Peon RDCS Referring Phys: [id number] Arnie Lao [facility]  Sonographer Comments: Global longitudinal strain was attempted. IMPRESSIONS  1. Left ventricular ejection fraction, by estimation, is 35 to 40%. The left ventricle has moderately decreased function. The left ventricle demonstrates regional wall motion abnormalities (see scoring diagram/findings for description). There is mild concentric left ventricular hypertrophy. Left ventricular diastolic parameters are indeterminate.  2. Right ventricular systolic function is normal. The right ventricular size is normal. There is mildly elevated pulmonary artery systolic pressure. The estimated right ventricular systolic pressure is 38.0 mmHg.  3. Left atrial size was moderately dilated.  4. The mitral valve is degenerative with mild posterior leaflet prolapse. Moderate to severe mitral valve regurgitation.  5. Tricuspid valve regurgitation is mild to moderate.  6. The aortic valve is tricuspid. There is mild calcification of the aortic valve. Aortic valve regurgitation is trivial. No aortic stenosis is present.  7. The inferior vena cava is normal in size with greater than 50% respiratory variability, suggesting right atrial pressure of 3 mmHg.  8. Evidence of probable atrial level shunting detected by color flow Doppler, left to right at mid interatrial septum. Suggest agitated saline study. Comparison(s): Prior images reviewed side by side. LVEF 35-40% with stable WMA and septal motion consistent with RV pacing. Mild posterior leaflet mitral prolapse with  moderate to severe mtiral regurgitation. 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 FINDINGS  Left Ventricle: Left ventricular ejection fraction, by estimation, is 35 to 40%. The left ventricle has moderately decreased function. The left ventricle demonstrates regional wall motion abnormalities. The left ventricular internal cavity size was normal in size. There is mild concentric left ventricular hypertrophy. Abnormal (paradoxical) septal motion, consistent with RV pacemaker. Left ventricular diastolic parameters are indeterminate.  LV Wall Scoring: The entire inferior wall, posterior wall, and basal anterolateral segment are hypokinetic. The entire anterior wall, entire septum, apical lateral segment, mid anterolateral segment, and apex are normal. Right Ventricle: The right ventricular size is normal. No increase in right ventricular wall thickness. Right ventricular systolic function is normal. There is mildly elevated pulmonary artery systolic pressure. The tricuspid regurgitant velocity is 2.96  m/s, and with an assumed right atrial pressure of 3 mmHg, the estimated right ventricular systolic pressure is 38.0 mmHg. Left Atrium: Left atrial size was moderately dilated. Right Atrium: Right atrial size was normal in size. Pericardium: There is no evidence of pericardial effusion. Mitral Valve: The mitral valve is degenerative in appearance. There  is mild thickening of the mitral valve leaflet(s). Moderate to severe mitral valve regurgitation, with posteriorly-directed jet. Tricuspid Valve: The tricuspid valve is grossly normal. Tricuspid valve regurgitation is mild to moderate. Aortic Valve: The aortic valve is tricuspid. There is mild calcification of the aortic valve. There is mild aortic valve annular calcification. Aortic valve regurgitation is trivial. No aortic stenosis is present. Pulmonic Valve: The pulmonic valve was grossly normal. Pulmonic valve regurgitation is trivial.  Aorta: The aortic root and ascending aorta are structurally normal, with no evidence of dilitation. Venous: The inferior vena cava is normal in size with greater than 50% respiratory variability, suggesting right atrial pressure of 3 mmHg. IAS/Shunts: Evidence of atrial level shunting detected by color flow Doppler. Additional Comments: 3D was performed not requiring image post processing on an independent workstation and was indeterminate. A device lead is visualized.  LEFT VENTRICLE PLAX 2D LVIDd:         5.10 cm LVIDs:         4.40 cm LV PW:         1.40 cm 3D Volume EF LV IVS:        1.00 cm LV 3D EDV:   123.82 ml                        LV 3D ESV:   48.23 ml RIGHT VENTRICLE          IVC RV Basal diam:  3.70 cm  IVC diam: 1.60 cm LEFT ATRIUM             Index        RIGHT ATRIUM           Index LA diam:        4.10 cm 2.46 cm/m   RA Area:     19.90 cm LA Vol (A2C):   58.0 ml 34.84 ml/m  RA Volume:   50.80 ml  30.52 ml/m LA Vol (A4C):   84.5 ml 50.76 ml/m LA Biplane Vol: 73.4 ml 44.10 ml/m  AORTIC VALVE LVOT Vmax:   75.95 cm/s LVOT Vmean:  46.700 cm/s LVOT VTI:    0.125 m  AORTA Ao Asc diam: 3.20 cm MITRAL VALVE               TRICUSPID VALVE MV Area (PHT): 3.12 cm    TR Peak grad:   35.0 mmHg MV Decel Time: 243 msec    TR Vmax:        296.00 cm/s MR Peak grad: 87.0 mmHg MR Mean grad: 62.0 mmHg    SHUNTS MR Vmax:      466.33 cm/s  Systemic VTI: 0.12 m MR Vmean:     367.0 cm/s MV E velocity: 70.60 cm/s MV A velocity: 30.70 cm/s MV E/A ratio:  2.30 Teddie Favre MD Electronically signed by Teddie Favre MD Signature Date/Time: 08/22/2023/11:28:01 AM    Final    CT CEREBRAL PERFUSION W CONTRAST Result Date: 08/21/2023 CLINICAL DATA:  Initial evaluation for acute visual disturbance. EXAM: CT PERFUSION BRAIN TECHNIQUE: Multiphase CT imaging of the brain was performed following IV bolus contrast injection. Subsequent parametric perfusion maps were calculated using RAPID software. RADIATION DOSE REDUCTION:  This exam was performed according to the departmental dose-optimization program which includes automated exposure control, adjustment of the mA and/or kV according to patient size and/or use of iterative reconstruction technique. CONTRAST:  32mL OMNIPAQUE  IOHEXOL  350 MG/ML SOLN COMPARISON:  Prior CTs from earlier the same  day. FINDINGS: CT Brain Perfusion Findings: CBF (<30%) Volume: 0mL Perfusion (Tmax>6.0s) volume: 0mL Mismatch Volume: 0mL ASPECTS on noncontrast CT Head: 10 at 5:43 p.m. today. Infarct Core: 0 mL Infarction Location:Negative CT perfusion for acute ischemia. Mildly delayed perfusion of T-max greater than 4 seconds in the watershed zones of the left cerebral hemispheres compared to the right, presumably related to the occluded left ICA as seen on prior CTA. IMPRESSION: 1. Negative CT perfusion for acute ischemia. 2. Mildly delayed perfusion of T-max greater than 4 seconds in the watershed zones of the left cerebral hemisphere compared to the right, presumably related to the occluded left ICA as seen on prior CTA. Electronically Signed   By: Virgia Griffins M.D.   On: 08/21/2023 21:04   CT ANGIO HEAD NECK W WO CM Addendum Date: 08/21/2023 ADDENDUM REPORT: 08/21/2023 19:32 ADDENDUM: CTA neck impression #1 and CTA head impression #1 called by telephone on 08/21/2023 at 7:14 pm to provider Dr. Alecia Ames, who verbally acknowledged these results. Electronically Signed   By: Bascom Lily D.O.   On: 08/21/2023 19:32   Result Date: 08/21/2023 CLINICAL DATA:  Provided history: Neuro deficit, acute, stroke suspected. Left-sided headache, vision changes, dizziness. EXAM: CT ANGIOGRAPHY HEAD AND NECK WITH AND WITHOUT CONTRAST TECHNIQUE: Multidetector CT imaging of the head and neck was performed using the standard protocol during bolus administration of intravenous contrast. Multiplanar CT image reconstructions and MIPs were obtained to evaluate the vascular anatomy. Carotid stenosis measurements (when  applicable) are obtained utilizing NASCET criteria, using the distal internal carotid diameter as the denominator. RADIATION DOSE REDUCTION: This exam was performed according to the departmental dose-optimization program which includes automated exposure control, adjustment of the mA and/or kV according to patient size and/or use of iterative reconstruction technique. CONTRAST:  60mL OMNIPAQUE  IOHEXOL  350 MG/ML SOLN COMPARISON:  Non-contrast head CT performed earlier today 08/21/2023. FINDINGS: CTA NECK FINDINGS Aortic arch: Standard aortic branching. Atherosclerotic plaque within the visualized aortic arch and proximal major branch vessels of the neck. No hemodynamically significant innominate or proximal subclavian artery stenosis. Right carotid system: CCA and ICA patent within the neck. Atherosclerotic plaque about the carotid bifurcation, within the proximal ICA and within the proximal ECA. Estimated 50% stenosis at the ICA origin. Severe stenosis of the proximal ECA. Left carotid system: The common carotid artery is patent within the neck. The internal carotid artery becomes occluded shortly beyond its origin and remains occluded throughout the remainder of the neck. Calcified atherosclerotic plaque at the ECA origin resulting in at least moderate stenosis. Vertebral arteries: Codominant and patent within the neck. Scattered atherosclerotic plaque within the cervical left vertebral artery. Most notably, calcified plaque within the proximal V3 segment results in up to moderate stenosis. Skeleton: Dextrocurvature of the cervical spine. Cervical spondylosis. No acute fracture or aggressive osseous lesion. Other neck: No neck mass or cervical lymphadenopathy. Upper chest: No consolidation within the imaged lung apices. Review of the MIP images confirms the above findings CTA HEAD FINDINGS Anterior circulation: The intracranial left internal carotid artery remains occluded to the paraclinoid level. Reconstitution  of enhancement within the supraclinoid left ICA. The intracranial right internal carotid artery is patent. Calcified atherosclerotic plaque within this vessel with no more than mild stenosis. The M1 middle cerebral arteries are patent. Atherosclerotic irregularity of the M2 and more distal MCA branches. No M2 proximal branch occlusion or high-grade proximal stenosis. The anterior cerebral arteries are patent. Azygous configuration of the anterior cerebral arteries. Mildly hypoplastic left A1 segment. No  intracranial aneurysm is identified. Posterior circulation: The intracranial vertebral arteries are patent. The basilar artery is patent. The posterior cerebral arteries are patent. Atherosclerotic irregularity of both vessels. Most notably, there is a moderate/severe stenosis within the left PCA P2 segment. A left posterior communicating artery is present. The right posterior communicating artery is diminutive or absent. Venous sinuses: Within the limitations of contrast timing, no convincing thrombus. Anatomic variants: As described grade Review of the MIP images confirms the above findings Attempts are being made to reach the ordering provider at this time. IMPRESSION: CTA neck: 1. 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 This is age-indeterminate. 2. The right common and internal carotid arteries are patent within the neck. Estimated 50% atherosclerotic narrowing at the right ICA origin. 3. Vertebral arteries patent within the neck. Up to moderate atherosclerotic narrowing of the left vertebral artery V3 segment. 4. Aortic Atherosclerosis (ICD10-I70.0). CTA head: 1. 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 This is age-indeterminate. Reconstitution of enhancement within the supraclinoid left ICA. 2. No proximal intracranial large vessel occlusion identified elsewhere. 3.  Background intracranial atherosclerotic disease as described. Most notably, there is a moderate/severe stenosis within the left PCA P2 segment. Electronically Signed: By: Bascom Lily D.O. On: 08/21/2023 19:04   CT HEAD CODE STROKE WO CONTRAST Result Date: 08/21/2023 CLINICAL DATA:  Code stroke. Neuro deficit, acute, stroke suspected. Dizziness. Eye pain on the left, headache, nausea. EXAM: CT HEAD WITHOUT CONTRAST TECHNIQUE: Contiguous axial images were obtained from the base of the skull through the vertex without intravenous contrast. RADIATION DOSE REDUCTION: This exam was performed according to the departmental dose-optimization program which includes automated exposure control, adjustment of the mA and/or kV according to patient size and/or use of iterative reconstruction technique. COMPARISON:  Head CT 08/08/2023. FINDINGS: Brain: Generalized cerebral atrophy. Prominent perivascular spaces within the right basal ganglia inferiorly. Small chronic infarct again demonstrated within the inferior right cerebellar hemisphere. There is no acute intracranial hemorrhage. No demarcated cortical infarct. No extra-axial fluid collection. No evidence of an intracranial mass. No midline shift. Vascular: No hyperdense vessel. Atherosclerotic calcifications. Skull: No calvarial fracture or aggressive osseous lesion. Sinuses/Orbits: No mass or acute finding within the imaged orbits. No significant paranasal sinus disease at the imaged levels. ASPECTS [facility] Stroke Program Early CT Score) - Ganglionic level infarction (caudate, lentiform nuclei, internal capsule, insula, M1-M3 cortex): 7 - Supraganglionic infarction (M4-M6 cortex): 3 Total score (0-10 with 10 being normal): 10 No evidence of an acute intracranial abnormality. These results were communicated to Dr. Alecia Ames at 5:55 pmon 6/10/2025by text page via the [facility] messaging system. IMPRESSION: 1.  No evidence of an acute intracranial abnormality. 2. 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 3. Cerebral atrophy. Electronically Signed   By: Bascom Lily D.O.   On: 08/21/2023 17:56   CT Cervical Spine Wo Contrast Result Date: 08/08/2023 CLINICAL DATA:  Neck trauma (Age >= 65y) EXAM: CT CERVICAL SPINE WITHOUT CONTRAST TECHNIQUE: Multidetector CT imaging of the cervical spine was performed without intravenous contrast. Multiplanar CT image reconstructions were also generated. RADIATION DOSE REDUCTION: This exam was performed according to the departmental dose-optimization program which includes automated exposure control, adjustment of the mA and/or kV according to patient size and/or use of iterative reconstruction technique. COMPARISON:  None Available. FINDINGS: Alignment: Straightening of normal lordosis. No traumatic subluxation. Skull base and vertebrae: No acute fracture.  Vertebral body heights are maintained. Schmorl's node within inferior C7. The dens and skull base are intact. Soft tissues and spinal canal: No prevertebral fluid or swelling. No visible canal hematoma. Disc levels: Disc space narrowing and anterior spurring C4-C5, C5-C6, C6-C7 and C7-T1. Upper chest: No acute findings. Biapical pleuroparenchymal scarring. Other: Carotid calcifications. IMPRESSION: 1. No acute fracture or subluxation of the cervical spine. 2. Multilevel degenerative disc disease. Electronically Signed   By: Chadwick Colonel M.D.   On: 08/08/2023 17:33   DG Elbow 2 Views Left Result Date: 08/08/2023 CLINICAL DATA:  Pain after fall. EXAM: LEFT ELBOW - 2 VIEW COMPARISON:  None Available. FINDINGS: There is no evidence of fracture or dislocation. Right elevated anterior fat pad but no posterior fat pad elevation. There is no evidence of arthropathy or other focal bone abnormality. Soft tissues are unremarkable. IMPRESSION: 1. Possible small joint effusion. 2. No evidence of fracture or dislocation. If symptoms persist, recommend follow-up  radiographs in 7-10 days. Electronically Signed   By: Chadwick Colonel M.D.   On: 08/08/2023 17:26   CT Head Wo Contrast Result Date: 08/08/2023 CLINICAL DATA:  Head trauma, minor (Age >= 65y) EXAM: CT HEAD WITHOUT CONTRAST TECHNIQUE: Contiguous axial images were obtained from the base of the skull through the vertex without intravenous contrast. RADIATION DOSE REDUCTION: This exam was performed according to the departmental dose-optimization program which includes automated exposure control, adjustment of the mA and/or kV according to patient size and/or use of iterative reconstruction technique. COMPARISON:  None Available. FINDINGS: Brain: No intracranial hemorrhage, mass effect, or midline shift. Generalized atrophy. No hydrocephalus. The basilar cisterns are patent. Perivascular space versus remote lacunar infarct in the right basal ganglia. Remote right cerebellar lacunar infarct. No evidence of territorial infarct or acute ischemia. No extra-axial or intracranial fluid collection. Vascular: Atherosclerosis of skullbase vasculature without hyperdense vessel or abnormal calcification. Skull: No fracture or focal lesion. Sinuses/Orbits: Paranasal sinuses and mastoid air cells are clear. The visualized orbits are unremarkable. Other: No confluent scalp hematoma. IMPRESSION: 1. No acute intracranial abnormality. No skull fracture. 2. Generalized atrophy. Remote right cerebellar lacunar infarct. Electronically Signed   By: Chadwick Colonel M.D.   On: 08/08/2023 17:24   DG Chest 2 View Result Date: 07/31/2023 CLINICAL DATA:  Shortness of breath. EXAM: CHEST - 2 VIEW COMPARISON:  02/02/2016. FINDINGS: Bilateral lung fields are clear. Bilateral costophrenic angles are clear. Normal cardio-mediastinal silhouette. There is a left sided 3-lead pacemaker. No acute osseous abnormalities. The soft tissues are within normal limits. IMPRESSION: *No active cardiopulmonary disease. Electronically Signed   By: [name] M.D.   On: 07/31/2023 14:19     PHYSICAL EXAM  Temp:  [97.9 F (36.6 C)-98.2 F (36.8 C)] 98.1 F (36.7 C) (06/17 0800) Pulse Rate:  [65-77] 77 (06/17 1300) Resp:  [10-27] 10 (06/17 1300) BP: (108-154)/(55-83) 133/60 (06/17 1300) SpO2:  [95 %-100 %] 98 % (06/17 1300) Arterial Line BP: (109-184)/(46-99) 128/62 (06/17 0300) Weight:  [55.5 kg] 55.5 kg (06/17 0500)  General - Well nourished, well developed pleasant elderly Caucasian male, in no apparent distress.  Ophthalmologic - fundi not visualized due to noncooperation.  Cardiovascular - Regular rhythm and rate.  Mental Status -  Level of arousal and orientation to time, place, and person were intact. Language including expression, naming, repetition, comprehension was assessed and found intact. Fund of Knowledge was assessed and was intact.  Cranial Nerves II - XII - II - Visual field intact OU. III, IV, VI -  Extraocular movements intact. V - Facial sensation intact bilaterally. VII - Facial movement intact bilaterally. VIII - Hearing & vestibular intact bilaterally. X - Palate elevates symmetrically. XI - Chin turning & shoulder shrug intact bilaterally. XII - Tongue protrusion intact.  Motor Strength - The patient's strength was normal in all extremities and pronator drift was absent.  Bulk was normal and fasciculations were absent.   Motor Tone - Muscle tone was assessed at the neck and appendages and was normal.  Reflexes - The patient's reflexes were symmetrical in all extremities and he had no pathological reflexes.  Sensory - Light touch, temperature/pinprick were assessed and were symmetrical.    Coordination - The patient had normal movements in the hands and feet with no ataxia or dysmetria.  Tremor was absent.  Gait and Station - deferred.   ASSESSMENT/PLAN [name]. is a 78 y.o. male with history of CHF, PAF on Xarelto , RA, AICD, CKD 3, hypertension, CAD, recent fall admitted for  headache, dizziness, nausea vomiting, transient left eye vision loss. No TNK given due to symptom resolved.    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  6/10 acute onset pain radiating from lower jaw to the frontal head on the left with left painless vision loss.  The pain gradually eased off in 2 to 3 hours, left vision loss resumed within 3 to 4 hours. 6/14 stroke code due to new aphasia. Symptoms resolved later, MRI negative for acute stroke but angiogram showed new M3 occlusion.  CT no acute abnormality, old right cerebellar infarct CT head and neck left ICA occlusion, reconstituted at siphon.  Right ICA 50% stenosis, left P2 moderate to severe stenosis CT perfusion negative MRI no acute infarct Cerebral catheter angiogram 08/27/2023 shows interval recanalization of the left M3 and carotid occlusion. 2D Echo EF 35 to 40% LDL 54 HgbA1c 5.6 UDS negative Xarelto  15 for VTE prophylaxis Xarelto  (rivaroxaban ) daily prior to admission, now recommend switching to Eliquis and aspirin  81 mg daily into 3 months  and then Eliquis alone Ongoing aggressive stroke risk factor management Therapy recommendations: Outpatient PT Disposition: Pending  PAF on [facility] Home Xarelto  15 Compliant with medication Currently on heparin  IV in preparation for cerebral angiogram  CHF 01/2023 EF 30 to 35% 07/31/2023 admitted for acute on chronic heart failure This admission EF 35 to 40% On Lasix  40 daily  Hypertension Stable Avoid low BP given left ICA occlusion Long term BP goal normotensive  Hyperlipidemia Home meds: Crestor  5 LDL 54, goal < 70 Now on Crestor  5 No high intensity statin given LDL at goal Continue statin at  discharge  Other Stroke Risk Factors Advanced age Coronary artery disease  Other Active Problems AICD placement Rheumatoid arthritis CKD 3, creatinine 1.84--2.12--1.83 Recent diarrhea  Hospital day # 4 Patient continues to have intermittent hematoma in the right wrist at the arterial puncture site.  This has stopped with local pressure.  He has been switched to Eliquis and aspirin .  Recommend mobilize out of bed.  Therapy consults.  Transfer to floor bed and likely discharge home in the next 1 to 2 days.  Long discussion with patient and Dr. Marygrace Snellen and  answered questions.   Greater than 50% time during this35-minute visit was spent in counseling and coordination of care and discussion with patient and wife and care team and answering questions.  Ardella Beaver, MD Medical Director [facility] Pager: [phone] 08/28/2023 2:55 PM  08/28/2023 2:55 PM    To contact Stroke Continuity provider, please refer to [email or web address]. After hours, contact General Neurology

## 2023-08-28 NOTE — Progress Notes (Signed)
 Physical Therapy Treatment Patient Details Name: Brent Ray. MRN: [id number] DOB: 1945-07-25 Today's Date: 08/28/2023   History of Present Illness Patient is 78 y.o. male who presented to [facility] 08/21/23 with dizziness, left visual scotoma.  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 Transferred to [facility] for MRI (due to PPM). 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    PT Comments  The pt was agreeable to session, with focus on progressive mobility and balance testing. He demos good stability with sit-stand transfers and gait when unchallenged, but increased sway and slowed movements with addition of challenge or dual tasking. The pt was educated on potential impact on safety and problem solving after d/c and pt reports I could have been like this before. Continue to recommend skilled PT acutely and OPPT follow up after d/c, pt likely mobilizing well enough to return home with family support once medically stable.    If plan is discharge home, recommend the following: A little help with walking and/or transfers;A little help with bathing/dressing/bathroom;Help with stairs or ramp for entrance;Assist for transportation   Can travel by private vehicle        Equipment Recommendations  None recommended by PT    Recommendations for Other Services       Precautions / Restrictions Precautions Precautions: Fall Recall of Precautions/Restrictions: Intact Precaution/Restrictions Comments: fall ~2 weeks ago- fell after MD appt with fasting blood draw and delayed meal after, sugar dropped to 36 Restrictions Weight Bearing Restrictions Per Provider Order: No     Mobility  Bed Mobility               General bed mobility comments: pt OOB in recliner    Transfers Overall transfer level: Needs  assistance Equipment used: None Transfers: Sit to/from Stand Sit to Stand: Supervision           General transfer comment: sup for safety    Ambulation/Gait Ambulation/Gait assistance: Supervision Gait Distance (Feet): 200 Feet Assistive device: None Gait Pattern/deviations: Step-through pattern, Decreased stride length, Decreased dorsiflexion - left Gait velocity: decr Gait velocity interpretation: <1.31 ft/sec, indicative of household ambulator   General Gait Details: overall steady, no LOB noted, Lt LE with slight steppage, hx of injury to leg.   Stairs Stairs: Yes Stairs assistance: Contact guard assist Stair Management: One rail Right, Step to pattern, Forwards Number of Stairs: 1     Wheelchair Mobility     Tilt Bed    Modified Rankin (Stroke Patients Only)       Balance Overall balance assessment: Needs assistance Sitting-balance support: No upper extremity supported, Feet supported Sitting balance-Leahy Scale: Good     Standing balance support: No upper extremity supported, During functional activity Standing balance-Leahy Scale: Fair Standing balance comment: CGA with increased sway with balance challenge             High level balance activites: Side stepping, Backward walking, Direction changes, Turns, Sudden stops, Head turns   Standardized Balance Assessment Standardized Balance Assessment : Dynamic Gait Index   Dynamic Gait Index Level Surface: Normal Change in Gait Speed: Mild Impairment Gait with Horizontal Head Turns: Mild Impairment Gait with Vertical Head Turns: Mild Impairment Gait and Pivot Turn: Mild Impairment Step Over Obstacle: Normal Step Around Obstacles: Mild Impairment Steps: Mild Impairment Total Score: 18      Communication Communication Communication:  No apparent difficulties  Cognition Arousal: Alert Behavior During Therapy: WFL for tasks assessed/performed   PT - Cognitive impairments: Memory, Attention,  Sequencing, Problem solving, Safety/Judgement                       PT - Cognition Comments: pt reports he could be at his baseline, difficutly with tasks of naming animals in alphebetical order (needed cues to state each letter and then cues to name animal each time) at times not following instructions with wayfinding Following commands: Intact      Cueing Cueing Techniques: Verbal cues  Exercises      General Comments        Pertinent Vitals/Pain Pain Assessment Pain Assessment: No/denies pain Pain Intervention(s): Monitored during session    Home Living                          Prior Function            PT Goals (current goals can now be found in the care plan section) Acute Rehab PT Goals Patient Stated Goal: get home back to routine PT Goal Formulation: With patient Time For Goal Achievement: 09/06/23 Potential to Achieve Goals: Good Progress towards PT goals: Progressing toward goals    Frequency    Min 2X/week      PT Plan      Co-evaluation              AM-PAC PT 6 Clicks Mobility   Outcome Measure  Help needed turning from your back to your side while in a flat bed without using bedrails?: None Help needed moving from lying on your back to sitting on the side of a flat bed without using bedrails?: None Help needed moving to and from a bed to a chair (including a wheelchair)?: A Little Help needed standing up from a chair using your arms (e.g., wheelchair or bedside chair)?: A Little Help needed to walk in hospital room?: A Little Help needed climbing 3-5 steps with a railing? : A Little 6 Click Score: 20    End of Session Equipment Utilized During Treatment: Gait belt Activity Tolerance: Patient tolerated treatment well Patient left: with call bell/phone within reach;in chair;with chair alarm set;with family/visitor present Nurse Communication: Mobility status PT Visit Diagnosis: Other abnormalities of gait and  mobility (R26.89);Unsteadiness on feet (R26.81);History of falling (Z91.81);Other symptoms and signs involving the nervous system (R29.898);Difficulty in walking, not elsewhere classified (R26.2)     Time: 6578-4696 PT Time Calculation (min) (ACUTE ONLY): 23 min  Charges:    $Gait Training: 8-22 mins $Therapeutic Activity: 8-22 mins PT General Charges $$ ACUTE PT VISIT: 1 Visit                     Barnabas Booth, PT, DPT   Acute Rehabilitation Department Office [phone] Secure Chat Communication Preferred   Lona Rist 08/28/2023, 4:09 PM

## 2023-08-28 NOTE — Plan of Care (Deleted)
 Brent Ray

## 2023-08-28 NOTE — Progress Notes (Signed)
 NAME:  Brent Mccauley., MRN:  [id number], DOB:  07-05-1945, LOS: 4 ADMISSION DATE:  08/21/2023, CONSULTATION DATE:  08/27/2023 REFERRING MD: Zelda Hickman  CHIEF COMPLAINT:  stroke   History of Present Illness:  78 year old male with past medical history of paroxysmal atrial fibrillation on Xarelto , CHF, symptomatic bradycardia s/p AICD placement, hyperlipidemia, hypertension, pre-diabetes, GERD, Barrett's esophagus, RA, CKD, left subclavian vein thrombosis who initially presented to [facility] on 08/21/23 with complaint of dizziness, headache and visual change that began around 1610 PM after leaving his cardiology appointment. He had CT head which showed occluded left internal carotid artery. He was evaluated by neurology and they recommended transfer to [facility] for close monitoring and CT perfusion scan. Perfusion study negative and admitted to hospitalist for ongoing stroke work up.   Pertinent  Medical History  paroxysmal atrial fibrillation on Xarelto , CHF, symptomatic bradycardia s/p AICD placement, hyperlipidemia, hypertension, pre-diabetes, GERD, Barrett's esophagus, RA, CKD, left subclavian vein thrombosis  Significant Hospital Events: Including procedures, antibiotic start and stop dates in addition to other pertinent events   6/10: [facility] for stroke symptoms. 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 CT perfusion study with no perfusion deficit. Admitted to hospitalist.  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 Neurology spoke with [facility] and agree to cerebral angiogram and possible intervention  6/14: acute aphasia, right sided weakness LKW 1930 > CT head with no acute findings or bleed. CTA with similar findings @ ICU and possible left M3 distal segment occlusion  6/16: NIR with complete revascularization of left ICA extra and intracranially, complete revascularization of M3 region > admit to ICU  for 24 hours post procedurally. IR recommend aspirin  81mg  daily and Brilinta 90mg  BID   Interim History / Subjective:  NAOEN. Oozing from puncture sites not unexpected.   Objective   Blood pressure (!) 154/78, pulse 72, temperature 98.2 F (36.8 C), temperature source Oral, resp. rate 14, height 5' 8 (1.727 m), weight 55.5 kg, SpO2 95%.        Intake/Output Summary (Last 24 hours) at 08/28/2023 1049 Last data filed at 08/28/2023 0100 Gross per 24 hour  Intake 1080.67 ml  Output 560 ml  Net 520.67 ml   Filed Weights   08/21/23 1652 08/27/23 0715 08/28/23 0500  Weight: 56 kg 55.9 kg 55.5 kg    Examination: General: older male, laying in bed in reverse trendelenburg  HENT: perrla, no facial droop, mucous membranes dry  Lungs: clear bilaterally, room air, breaths even and unlabored  Cardiovascular: s1s2, no murmur, doppler pedal pulses Abdomen: abdomen flat and soft  Extremities: dpppler pulses present bilateraly, extremities warm, cath site without large hematoma  Neuro: a&ox4, no facial droop or aphasia, no visual field cuts, strength equal bilaterally, sensation intact  GU: no foley   Resolved Hospital Problem list    Assessment & Plan:  Amaurosis fugax with 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 6/10 admitted with visual scotoma which resolved spontaneously, 6/14 with aphasia and right sided weakness that resolved. CT head and neck with Left ICU occlusion, possible left M3 occlusion. NIR on 6/16 without intervention.  --anticoag and anti plts per stroke team  Paroxysmal atrial fibrillation on Xarelto   Congestive heart failure  Symptomatic bradycardia s/p AICD placement  Has had  previous ablation without success. Rate controlled a fib on Xarelto  outpatient. echo repeated on admission with EF 35-40%, RWMA, mod-severe MR, probably atrial shunting L to R.  - tele  monitoring  - AC per stroke team, now eliquis - GDMT as able   Hypertension  - holding losartan , resume when clinically indicated   Hyperlipidemia  - rosuvastatin  5mg  weekly   CKD III  Baseline ~1.8-2.0. currently at baseline  - trend bmp, mag, phos - replete elytes - strict I&O - Avoid nephrotoxic agents, renally dose medications - ensure adequate renal perfusion   RA  - on methotrexate  at home  - keep on hold for now, next dose due Friday resume as able  Best Practice (right click and Reselect all SmartList Selections daily)   Diet/type: Regular consistency (see orders) DVT prophylaxis: DOAC GI prophylaxis: PPI Lines: Arterial Line and yes and it is still needed Foley:  N/A Code Status:  full code Last date of multidisciplinary goals of care discussion [updated patient on plan of care at bedside]  Labs   CBC: Recent Labs  Lab 08/21/23 1722 08/24/23 0647 08/25/23 1608 08/26/23 1134 08/27/23 0534  WBC 7.0 4.8 8.5 8.1 6.6  NEUTROABS 5.7  --   --   --   --   HGB 12.4* 12.5* 13.8 12.2* 12.1*  HCT 36.5* 37.5* 40.7 36.6* 35.0*  MCV 101.1* 98.4 98.8 100.0 99.4  PLT 264 222 197 245 237    Basic Metabolic Panel: Recent Labs  Lab 08/21/23 1722 08/23/23 0736 08/25/23 0644 08/26/23 1134 08/27/23 0534  NA 140 139 138 135 138  K 3.4* 3.7 3.3* 3.7 3.7  CL 114* 111 107 106 111  CO2 15* 17* 19* 17* 19*  GLUCOSE 128* 88 96 91 97  BUN 32* 21 23 24* 25*  CREATININE 2.12* 1.83* 2.24* 2.00* 2.14*  CALCIUM  8.8* 8.6* 9.0 8.8* 8.7*  MG  --  1.9 1.9 1.8 2.0  PHOS  --   --  3.3 2.6 3.0   GFR: Estimated Creatinine Clearance: 23.4 mL/min (A) (by C-G formula based on SCr of 2.14 mg/dL (H)). Recent Labs  Lab 08/24/23 0647 08/25/23 1608 08/26/23 1134 08/27/23 0534  WBC 4.8 8.5 8.1 6.6    Liver Function Tests: Recent Labs  Lab 08/21/23 1722  AST 37  ALT 33  ALKPHOS 57  BILITOT 1.3*  PROT 6.4*  ALBUMIN  3.5   No results for input(s): LIPASE, AMYLASE in  the last 168 hours. No results for input(s): AMMONIA in the last 168 hours.  ABG No results found for: PHART, PCO2ART, PO2ART, HCO3, TCO2, ACIDBASEDEF, O2SAT   Coagulation Profile: Recent Labs  Lab 08/27/23 0847  INR 1.1    Cardiac Enzymes: No results for input(s): CKTOTAL, CKMB, CKMBINDEX, TROPONINI in the last 168 hours.  HbA1C: Hgb A1c MFr Bld  Date/Time Value Ref Range Status  08/21/2023 05:22 PM 5.6 4.8 - 5.6 % Final    Comment:    (NOTE)         Prediabetes: 5.7 - 6.4         Diabetes: >6.4         Glycemic control for adults with diabetes: <7.0   01/24/2012 08:29 AM 5.6 4.6 - 6.5 % Final    Comment:    Glycemic Control Guidelines for People with Diabetes:Non Diabetic:  <6%Goal of Therapy: <7%Additional Action Suggested:  >8%     CBG: Recent Labs  Lab 08/25/23 2019  GLUCAP 82    Review of Systems:  As above  Past Medical History:  He,  has a past medical history of A-fib (HCC), AICD (automatic cardioverter/defibrillator) present, Barrett's esophagus (2011), CHF (congestive heart failure) (HCC), Chronic kidney disease, Coronary atherosclerosis of native coronary artery, Essential hypertension, benign, GERD (gastroesophageal reflux disease), Heart block AV first degree, History of kidney stones, Hypercholesterolemia, Mixed hyperlipidemia, NSVT (nonsustained ventricular tachycardia) (HCC), PAF (paroxysmal atrial fibrillation) (HCC), Pre-diabetes, Rheumatoid arthritis (HCC), Sinus bradycardia, Skin cancer, and Sleep apnea.   Surgical History:   Past Surgical History:  Procedure Laterality Date   CARDIAC CATHETERIZATION N/A 12/13/2015   Procedure: Left Heart Cath and Coronary Angiography;  Surgeon: Avanell Leigh, MD;  Location: [facility] INVASIVE CV LAB;  Service: Cardiovascular;  Laterality: N/A;   CARDIOVERSION N/A 04/11/2016   Procedure: CARDIOVERSION;  Surgeon: Maudine Sos, MD;  Location: [facility] ENDOSCOPY;  Service: Cardiovascular;   Laterality: N/A;   COLONOSCOPY  03/13/2012   [place]--Dr. Spainhour   EP IMPLANTABLE DEVICE N/A 02/01/2016   Procedure: Pacemaker Implant;  Surgeon: Jolly Needle, MD;  Location: [facility] INVASIVE CV LAB;  Service: Cardiovascular;  Laterality: N/A;   ESOPHAGOGASTRODUODENOSCOPY  03/13/2012   [place], [place]--Dr. Spainhour   HERNIA REPAIR     2   IR ANGIO INTRA EXTRACRAN SEL COM CAROTID INNOMINATE BILAT MOD SED  08/27/2023   IR ANGIO VERTEBRAL SEL VERTEBRAL UNI L MOD SED  08/27/2023   KIDNEY STONE SURGERY     LEFT HEART CATHETERIZATION WITH CORONARY ANGIOGRAM N/A 08/20/2012   Procedure: LEFT HEART CATHETERIZATION WITH CORONARY ANGIOGRAM;  Surgeon: Eilleen Grates, MD;  Location: [facility] CATH LAB;  Service: Cardiovascular;  Laterality: N/A;   Left leg surgery  03/13/1977   RADIOLOGY WITH ANESTHESIA N/A 08/27/2023   Procedure: RADIOLOGY WITH ANESTHESIA;  Surgeon: Luellen Sages, MD;  Location: [facility] OR;  Service: Radiology;  Laterality: N/A;   SKIN CANCER EXCISION     TOTAL HIP ARTHROPLASTY Left 01/11/2022   Procedure: TOTAL HIP ARTHROPLASTY ANTERIOR APPROACH;  Surgeon: Adonica Hoose, MD;  Location: [facility] ORS;  Service: Orthopedics;  Laterality: Left;  150     Social History:   reports that he has never smoked. He has never been exposed to tobacco smoke. He has never used smokeless tobacco. He reports current alcohol  use of about 2.0 standard drinks of alcohol  per week. He reports that he does not use drugs.   Family History:  His family history includes Colon cancer in his mother; Other in his sister, sister, sister, and son.   Allergies Allergies  Allergen Reactions   Amiodarone Other (See Comments)    Pt reports vision changes and walking changes    Flecainide  Other (See Comments)    Severe headaches, blurred vision   Carvedilol  Other (See Comments)    Unknown    Dapagliflozin Diarrhea   Eplerenone Other (See Comments)    Unknown    Flomax [Tamsulosin Hcl] Hives, Itching and Rash    Lisinopril  Nausea And Vomiting   Metoprolol  Other (See Comments)    Headaches, dizziness   Sacubitril-Valsartan Nausea And Vomiting   Spironolactone  Swelling and Other (See Comments)    Breast swelling, headaches   Statins Other (See Comments)    Leg cramps   Sulfasalazine Nausea Only     Home Medications  Prior to Admission medications   Medication Sig Start Date End Date Taking? Authorizing Provider  cephALEXin (KEFLEX) 500 MG capsule Take 500 mg by mouth once. Only for dental appointments 10/21/22  Yes [provider]  CRANBERRY PO Take 500 mg by  mouth daily.   Yes [provider]  folic acid  (FOLVITE ) 1 MG tablet Take 1 mg by mouth daily.  NOT on Sunday d/t methotrexate  12/13/22  Yes [provider]  furosemide  (LASIX ) 20 MG tablet Take 1 tablet (20 mg total) by mouth daily. Patient taking differently: Take 20 mg by mouth every other day. 08/21/23  Yes Sabharwal, Aditya, DO  losartan  (COZAAR ) 25 MG tablet Take 0.5 tablets (12.5 mg total) by mouth daily. 08/21/23  Yes Sabharwal, Aditya, DO  methotrexate  (RHEUMATREX) 2.5 MG tablet Take 2.5 mg by mouth every Friday. 6 pills weekly   Yes [provider]  mexiletine (MEXITIL ) 150 MG capsule Take 150 mg by mouth 2 (two) times daily.   Yes [provider]  NITROSTAT  0.4 MG SL tablet DISSOLVE 1 TABLET UNDER TONGUE EVERY 5 MINUTES AS NEEDED FOR CHEST PAIN Patient taking differently: Place 0.4 mg under the tongue every 5 (five) minutes as needed for chest pain. 05/11/14  Yes Gerard Knight, MD  ondansetron  (ZOFRAN ) 4 MG tablet Take 4 mg by mouth every 8 (eight) hours as needed for nausea. 01/15/23  Yes [provider]  pantoprazole  (PROTONIX ) 40 MG tablet Take 40 mg by mouth every evening.   Yes [provider]  potassium chloride  SA (KLOR-CON  M) 20 MEQ tablet Take 20 mEq by mouth daily.   Yes [provider]  Probiotic Product (PROBIOTIC DAILY PO) Take 110 mg by mouth daily.    Yes [provider]  Rivaroxaban  (XARELTO ) 15 MG TABS tablet Take 15 mg by mouth daily in the afternoon.   Yes [provider]  rosuvastatin  (CRESTOR ) 5 MG tablet Take 5 mg by mouth every Sunday.   Yes [provider]     Critical care time: n/a    Guerry Leek, MD [facility] 08/28/23 10:49 AM  Please see [email or web address] for pager details.  From 7A-7P if no response, please call [phone] After hours, please call ELink [phone]

## 2023-08-28 NOTE — Progress Notes (Signed)
 Patient had a peripheral IV removed on day shift from his right [facility]. At shift change it was noted to still be bleeding. Pressure dressing changed 3 times and manual pressure held every time, then a quick qlot was applied to site with a gauze and tegaderm over top. Site still bleeding after all interventions, eLink RN notified of the bleeding, order placed to add a thrombi pad to the site and hold manual pressure again for 30 min. Order also placed to draw morning labs along with a protime-INR lab. Arterial line at this time also stopped working so this Charity fundraiser d/c'd the aline and called phlebotomy to draw labs. Site finally stopped bleeding at 0400.

## 2023-08-29 ENCOUNTER — Other Ambulatory Visit (HOSPITAL_COMMUNITY): Payer: Self-pay

## 2023-08-29 ENCOUNTER — Telehealth (HOSPITAL_COMMUNITY): Payer: Self-pay | Admitting: Pharmacy Technician

## 2023-08-29 DIAGNOSIS — I7771 Dissection of carotid artery: Secondary | ICD-10-CM | POA: Diagnosis not present

## 2023-08-29 DIAGNOSIS — R2990 Unspecified symptoms and signs involving the nervous system: Secondary | ICD-10-CM

## 2023-08-29 DIAGNOSIS — G453 Amaurosis fugax: Secondary | ICD-10-CM | POA: Diagnosis not present

## 2023-08-29 DIAGNOSIS — W19XXXA Unspecified fall, initial encounter: Secondary | ICD-10-CM | POA: Diagnosis not present

## 2023-08-29 DIAGNOSIS — H539 Unspecified visual disturbance: Secondary | ICD-10-CM | POA: Diagnosis not present

## 2023-08-29 DIAGNOSIS — R42 Dizziness and giddiness: Secondary | ICD-10-CM | POA: Diagnosis not present

## 2023-08-29 DIAGNOSIS — I6522 Occlusion and stenosis of left carotid artery: Secondary | ICD-10-CM | POA: Diagnosis not present

## 2023-08-29 DIAGNOSIS — I480 Paroxysmal atrial fibrillation: Secondary | ICD-10-CM | POA: Diagnosis not present

## 2023-08-29 LAB — BASIC METABOLIC PANEL WITH GFR
Anion gap: 7 (ref 5–15)
BUN: 29 mg/dL — ABNORMAL HIGH (ref 8–23)
CO2: 19 mmol/L — ABNORMAL LOW (ref 22–32)
Calcium: 8.7 mg/dL — ABNORMAL LOW (ref 8.9–10.3)
Chloride: 112 mmol/L — ABNORMAL HIGH (ref 98–111)
Creatinine, Ser: 2.35 mg/dL — ABNORMAL HIGH (ref 0.61–1.24)
GFR, Estimated: 28 mL/min — ABNORMAL LOW (ref >60)
Glucose, Bld: 96 mg/dL (ref 70–99)
Potassium: 4.4 mmol/L (ref 3.5–5.1)
Sodium: 138 mmol/L (ref 135–145)

## 2023-08-29 LAB — CBC
HCT: 31.4 % — ABNORMAL LOW (ref 39.0–52.0)
Hemoglobin: 10.5 g/dL — ABNORMAL LOW (ref 13.0–17.0)
MCH: 34 pg (ref 26.0–34.0)
MCHC: 33.4 g/dL (ref 30.0–36.0)
MCV: 101.6 fL — ABNORMAL HIGH (ref 80.0–100.0)
Platelets: 243 10*3/uL (ref 150–400)
RBC: 3.09 MIL/uL — ABNORMAL LOW (ref 4.22–5.81)
RDW: 15.7 % — ABNORMAL HIGH (ref 11.5–15.5)
WBC: 6.5 10*3/uL (ref 4.0–10.5)
nRBC: 0 % (ref 0.0–0.2)

## 2023-08-29 MED ORDER — ASPIRIN 81 MG PO CHEW
81.0000 mg | CHEWABLE_TABLET | Freq: Every day | ORAL | 0 refills | Status: DC
Start: 2023-08-29 — End: 2024-01-02
  Filled 2023-08-29: qty 21, 21d supply, fill #0

## 2023-08-29 MED ORDER — APIXABAN 2.5 MG PO TABS
2.5000 mg | ORAL_TABLET | Freq: Two times a day (BID) | ORAL | 0 refills | Status: DC
  Filled 2023-08-29: qty 60, 30d supply, fill #0

## 2023-08-29 NOTE — Telephone Encounter (Signed)
 Patient Product/process development scientist completed.    The patient is insured through [employer]. Patient has Medicare and is not eligible for a copay card, but may be able to apply for patient assistance or Medicare RX Payment Plan (Patient Must reach out to their plan, if eligible for payment plan), if available.    Ran test claim for Eliquis 2.5 mg and the current 30 day co-pay is $468.88 due to a $590.00 deductible.   This test claim was processed through [facility]- copay amounts may vary at other pharmacies due to pharmacy/plan contracts, or as the patient moves through the different stages of their insurance plan.     Morgan Arab, CPHT Pharmacy Technician III Certified Patient Advocate [facility] Patient Advocate Team Direct Number: [phone]  Fax: [phone]

## 2023-08-29 NOTE — Discharge Instructions (Signed)
 Clean L knee and L elbow with Vashe then medihoney daily. Cover with nonadherent gauze, wrap with gauze.

## 2023-08-29 NOTE — Progress Notes (Signed)
 [facility] PROGRESS NOTE    Progress Note  Brent Ray.  UEA:[id number] DOB: 08-14-1945 DOA: 08/21/2023 PCP: Tressie Fryer, MD     Brief Narrative:   Brent Ray. is an 78 y.o. male past medical history paroxysmal atrial fibrillation on Xarelto , chronic diastolic dysfunction, bradycardia status post AICD, essential hypertension diabetes mellitus chronic kidney disease stage IIIb, with subclavian thrombosis initially presented in pain ED on 08/21/2023 complaining of dizziness and headaches, CT of the head showed left internal carotid artery.  Neurology was consulted and will be transferred to [facility] CT perfusion can was negative.  Significant Events: 6/10: [facility] for stroke symptoms. 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 CT perfusion study with no perfusion deficit. Admitted to hospitalist.  6/11: transferred to [facility]; symptoms are resolved  6/12: MRI brain with in left occipital lobe, known left ICA occlusion > heparin  started. Neurology spoke with [facility] and agree to cerebral angiogram and possible intervention  6/14: acute aphasia, right sided weakness LKW 1930 > CT head with no acute findings or bleed. CTA with similar findings @ ICU and possible left M3 distal segment occlusion  6/16: NIR with complete revascularization of left ICA extra and intracranially, complete revascularization of M3 region > admit to ICU for 24 hours post procedurally. 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 Neurology was consulted recommended angiogram in 08/27/2023 that showed interval recanalization of the left M3 and carotid occlusion. 2D echo showed an EF of 35%. A1c was 5.6, LDL 54, UDS negative. UDS was negative. Initially started on IV heparin  then transition  to oral Eliquis plus aspirin  3 months then Eliquis alone. Physical therapy recommended outpatient PT.  Paroxysmal atrial fibrillation/sinus bradycardia with a history of an AICD placement At home she was on Xarelto  now transition to oral Eliquis. EF of 35% with moderate to severe mitral regurg. No events on telemetry.  Chronic systolic heart failure: With an EF of 30 to 35%. Continue Lasix  daily.  Essential hypertension: Avoid low BP due to her left ICA occlusion.  Hyperlipidemia: Continue Crestor .  Right wrist hematoma at the anterior puncture site: Stop with local pressure.  Chronic kidney disease stage IIIb: His creatinine appears to be at baseline.  Rheumatoid arthritis: Continue methotrexate  home dose.    DVT prophylaxis: Eliquis Family Communication:none Status is: Inpatient Remains inpatient appropriate because: Acute CVA    Code Status:     Code Status Orders  (From admission, onward)           Start     Ordered   08/27/23 1304  Full code  Continuous       Question:  By:  Answer:  Consent: discussion documented in EHR   08/27/23 1307           Code Status History     Date Active Date Inactive Code Status Order ID Comments User Context   08/21/2023 2315 08/27/2023 1307 Full Code [id number]  Pati Bonine, MD ED   07/31/2023 1729 08/01/2023 1925 Limited: Do not attempt resuscitation (DNR) -DNR-LIMITED -Do Not Intubate/DNI  [id number]  Bennie Brave, MD ED   01/11/2022 1505 01/14/2022 1700 Full Code [id number]  Adonica Hoose, MD Inpatient   02/01/2016 1648 02/02/2016 1438 Full Code [id number]  Jolly Needle, MD Inpatient   12/13/2015 1233 12/16/2015 1741 Full Code [id number]  Dorma Gash, PA Inpatient      Advance Directive Documentation    Flowsheet Row Most Recent Value  Type of Advance Directive Living will, Healthcare Power of Attorney  Pre-existing out of facility DNR order (yellow form or pink MOST form) --  MOST Form in Place?  --      IV Access:   Peripheral IV   Procedures and diagnostic studies:   IR ANGIO INTRA EXTRACRAN SEL COM CAROTID INNOMINATE BILAT MOD SED Result Date: 08/28/2023 CLINICAL DATA:  Transient effusion, right-sided weakness, and left-sided amaurosis fugax. 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 EXAM: BILATERAL COMMON CAROTID AND INNOMINATE ANGIOGRAPHY COMPARISON:  CT angiogram of the head and neck June 10, and August 25, 2023. MEDICATIONS: Heparin  2000 units IV; Ancef  2 g IV antibiotic was administered within 1 hour of the procedure. ANESTHESIA/SEDATION: General anesthesia. CONTRAST:  Omnipaque  300 approximately 60 mL. FLUOROSCOPY TIME:  Fluoroscopy Time: 6 minutes 36 seconds (380 mGy). COMPLICATIONS: None immediate. TECHNIQUE: Informed written consent was obtained from the patient after a thorough discussion of the procedural risks, benefits and alternatives. All questions were addressed. Maximal Sterile Barrier Technique was utilized including caps, mask, sterile gowns, sterile gloves, sterile drape, hand hygiene and skin antiseptic. A timeout was performed prior to the initiation of the procedure. The right groin was prepped and draped in the usual sterile fashion. Thereafter using modified Seldinger technique, transfemoral access into the right common femoral artery was obtained without difficulty. Over an 0.035 inch guidewire, an 8 French 25 cm Pinnacle sheath was inserted. Through this, and also over an 0.035 inch guidewire, a 5 [place] JB 1 catheter was advanced to the aortic arch region and selectively positioned in the right common carotid artery, the innominate artery, the right common carotid artery, the left common carotid artery and the left vertebral artery. FINDINGS: The innominate arteriogram demonstrates right subclavian artery and the proximal right common carotid artery to be widely patent. The right vertebral artery origin is widely patent. The vessel opacifies to the  cranial skull base. Patency is seen of the right posterior-inferior cerebellar artery and the right vertebrobasilar junction. The opacified portions of the basilar artery, the posterior cerebral arteries and the superior cerebellar arteries on the lateral projection are grossly patent. The right common carotid arteriogram demonstrates the right external carotid artery and its major branches to be widely patent. Right internal carotid artery at the bulb demonstrates a smooth atherosclerotic plaque along the lateral posterior aspect of the carotid bulb without evidence of intraluminal filling defects. No acute ulcerations are seen. Distal to this, the right ICA opacifies to the cranial skull base. A focal area of mild narrowing interspersed with an area of small focal outpouchings without intrinsic stenosis is seen involving the mid cervical ICA. The petrous, the cavernous and the supraclinoid right ICA are widely patent. The right middle cerebral artery and the right anterior cerebral artery opacify into the capillary and venous phases. Suggestion of a dominant right anterior cerebral A2 segment is seen with both the pericallosal arteries into the capillary and venous phases. The left common carotid arteriogram demonstrates the left external carotid artery to be mildly narrowed at its origin. Its branches opacify normally. The left internal carotid artery at the bulb to the cranial skull base is widely patent. 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 No evidence of intraluminal filling defects or intimal flaps is evident. At the level of the proximal cavernous and caval segment there is an ill-defined area of faint lucency which probably represents an atherosclerotic plaque along the medial aspect best seen on the magnified oblique images, and corresponds to  plaque seen on the CT angiogram of the head and neck. The supraclinoid left ICA is widely patent. The left middle cerebral artery opacifies into the capillary and venous phases. The right anterior A1 segment is patent with flow noted into the hypoplastic right anterior cerebral A2 segment as described earlier. The left vertebral artery origin is widely patent. The vessel opacifies normally to the cranial skull base. Patency is seen of the left vertebrobasilar junction and the left posterior-inferior cerebellar artery. The opacified portion of the basilar artery, the posterior cerebral arteries, the superior cerebellar arteries and the anterior-inferior cerebellar arteries demonstrate wide patency into the capillary and venous phases. Unopacified blood is seen in the basilar artery from the contralateral vertebral artery. 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 Mildly decompressed left ICA extra cranially. PLAN: Findings reviewed with patient and spouse. Findings relayed to the patient's stroke neurologist also. Follow-up as per stroke neurologist. Electronically Signed   By: Luellen Sages M.D.   On: 08/28/2023 08:26   IR ANGIO VERTEBRAL SEL VERTEBRAL UNI L MOD SED Result Date: 08/28/2023 CLINICAL DATA:  Transient effusion, right-sided weakness, and left-sided amaurosis fugax. 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 EXAM: BILATERAL COMMON CAROTID AND INNOMINATE ANGIOGRAPHY COMPARISON:  CT angiogram of the head and neck June 10, and August 25, 2023. MEDICATIONS: Heparin  2000 units IV; Ancef  2 g IV antibiotic was administered within 1 hour of the procedure. ANESTHESIA/SEDATION: General anesthesia. CONTRAST:  Omnipaque  300  approximately 60 mL. FLUOROSCOPY TIME:  Fluoroscopy Time: 6 minutes 36 seconds (380 mGy). COMPLICATIONS: None immediate. TECHNIQUE: Informed written consent was obtained from the patient after a thorough discussion of the procedural risks, benefits and alternatives. All questions were addressed. Maximal Sterile Barrier Technique was utilized including caps, mask, sterile gowns, sterile gloves, sterile drape, hand hygiene and skin antiseptic. A timeout was performed prior to the initiation of the procedure. The right groin was prepped and draped in the usual sterile fashion. Thereafter using modified Seldinger technique, transfemoral access into the right common femoral artery was obtained without difficulty. Over an 0.035 inch guidewire, an 8 French 25 cm Pinnacle sheath was inserted. Through this, and also over an 0.035 inch guidewire, a 5 [place] JB 1 catheter was advanced to the aortic arch region and selectively positioned in the right common carotid artery, the innominate artery, the right common carotid artery, the left common carotid artery and the left vertebral artery. FINDINGS: The innominate arteriogram demonstrates right subclavian artery and the proximal right common carotid artery to be widely patent. The right vertebral artery origin is widely patent. The vessel opacifies to the cranial skull base. Patency is seen of the right posterior-inferior cerebellar artery and the right vertebrobasilar junction. The opacified portions of the basilar artery, the posterior cerebral arteries and the superior cerebellar arteries on the lateral projection are grossly patent. The right common carotid arteriogram demonstrates the right external carotid artery and its major branches to be widely patent. Right internal carotid artery  at the bulb demonstrates a smooth atherosclerotic plaque along the lateral posterior aspect of the carotid bulb without evidence of intraluminal filling defects. No acute ulcerations are  seen. Distal to this, the right ICA opacifies to the cranial skull base. A focal area of mild narrowing interspersed with an area of small focal outpouchings without intrinsic stenosis is seen involving the mid cervical ICA. The petrous, the cavernous and the supraclinoid right ICA are widely patent. The right middle cerebral artery and the right anterior cerebral artery opacify into the capillary and venous phases. Suggestion of a dominant right anterior cerebral A2 segment is seen with both the pericallosal arteries into the capillary and venous phases. The left common carotid arteriogram demonstrates the left external carotid artery to be mildly narrowed at its origin. Its branches opacify normally. The left internal carotid artery at the bulb to the cranial skull base is widely patent. 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 No evidence of intraluminal filling defects or intimal flaps is evident. At the level of the proximal cavernous and caval segment there is an ill-defined area of faint lucency which probably represents an atherosclerotic plaque along the medial aspect best seen on the magnified oblique images, and corresponds to plaque seen on the CT angiogram of the head and neck. The supraclinoid left ICA is widely patent. The left middle cerebral artery opacifies into the capillary and venous phases. The right anterior A1 segment is patent with flow noted into the hypoplastic right anterior cerebral A2 segment as described earlier. The left vertebral artery origin is widely patent. The vessel opacifies normally to the cranial skull base. Patency is seen of the left vertebrobasilar junction and the left posterior-inferior cerebellar artery. The opacified portion of the basilar artery, the posterior cerebral arteries, the superior cerebellar arteries  and the anterior-inferior cerebellar arteries demonstrate wide patency into the capillary and venous phases. Unopacified blood is seen in the basilar artery from the contralateral vertebral artery. 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 Mildly decompressed left ICA extra cranially. PLAN: Findings reviewed with patient and spouse. Findings relayed to the patient's stroke neurologist also. Follow-up as per stroke neurologist. Electronically Signed   By: Luellen Sages M.D.   On: 08/28/2023 08:26     Medical Consultants:   None.   Subjective:    Brent Ray. no complaints this morning feels great tolerating his diet.  Objective:    Vitals:   08/28/23 2000 08/29/23 0000 08/29/23 0400 08/29/23 0500  BP: 131/63 (!) 103/49 132/71   Pulse: 65 69 70   Resp: 13 13 12    Temp: 98.3 F (36.8 C) 98.3 F (36.8 C) 98.3 F (36.8 C)   TempSrc: Oral Oral Oral   SpO2: 100% 97% 99%   Weight:    54.6 kg  Height:       SpO2: 99 %   Intake/Output Summary (Last 24 hours) at 08/29/2023 0614 Last data filed at 08/29/2023 0522 Gross per 24 hour  Intake 720 ml  Output 1250 ml  Net -530 ml   Filed Weights   08/27/23 0715 08/28/23 0500 08/29/23 0500  Weight: 55.9 kg 55.5 kg 54.6 kg  Exam: General exam: In no acute distress. Respiratory system: Good air movement and clear to auscultation. Cardiovascular system: S1 & S2 heard, RRR. No JVD. Gastrointestinal system: Abdomen is nondistended, soft and nontender.  Extremities: No pedal edema. Skin: No rashes, lesions or ulcers Psychiatry: Judgement and insight appear normal. Mood & affect appropriate.    Data Reviewed:    Labs: Basic Metabolic  Panel: Recent Labs  Lab 08/23/23 0736 08/25/23 0644 08/26/23 1134 08/27/23 0534 08/29/23 0519  NA 139 138 135 138 138  K 3.7 3.3* 3.7 3.7 4.4  CL 111 107 106 111 112*  CO2 17* 19* 17* 19* 19*  GLUCOSE 88 96 91 97 96  BUN 21 23 24* 25* 29*  CREATININE 1.83* 2.24* 2.00* 2.14* 2.35*  CALCIUM  8.6* 9.0 8.8* 8.7* 8.7*  MG 1.9 1.9 1.8 2.0  --   PHOS  --  3.3 2.6 3.0  --    GFR Estimated Creatinine Clearance: 20.7 mL/min (A) (by C-G formula based on SCr of 2.35 mg/dL (H)). Liver Function Tests: No results for input(s): AST, ALT, ALKPHOS, BILITOT, PROT, ALBUMIN  in the last 168 hours. No results for input(s): LIPASE, AMYLASE in the last 168 hours. No results for input(s): AMMONIA in the last 168 hours. Coagulation profile Recent Labs  Lab 08/27/23 0847  INR 1.1   COVID-19 Labs  No results for input(s): DDIMER, FERRITIN, LDH, CRP in the last 72 hours.  No results found for: SARSCOV2NAA  CBC: Recent Labs  Lab 08/24/23 0647 08/25/23 1608 08/26/23 1134 08/27/23 0534 08/29/23 0519  WBC 4.8 8.5 8.1 6.6 6.5  HGB 12.5* 13.8 12.2* 12.1* 10.5*  HCT 37.5* 40.7 36.6* 35.0* 31.4*  MCV 98.4 98.8 100.0 99.4 101.6*  PLT 222 197 245 237 243   Cardiac Enzymes: No results for input(s): CKTOTAL, CKMB, CKMBINDEX, TROPONINI in the last 168 hours. BNP (last 3 results) No results for input(s): PROBNP in the last 8760 hours. CBG: Recent Labs  Lab 08/25/23 2019  GLUCAP 82   D-Dimer: No results for input(s): DDIMER in the last 72 hours. Hgb A1c: No results for input(s): HGBA1C in the last 72 hours. Lipid Profile: No results for input(s): CHOL, HDL, LDLCALC, TRIG, CHOLHDL, LDLDIRECT in the last 72 hours. Thyroid  function studies: No results for input(s): TSH, T4TOTAL, T3FREE, THYROIDAB in the last 72 hours.  Invalid input(s): FREET3 Anemia work up: No results for input(s): VITAMINB12, FOLATE, FERRITIN, TIBC,  IRON, RETICCTPCT in the last 72 hours. Sepsis Labs: Recent Labs  Lab 08/25/23 1608 08/26/23 1134 08/27/23 0534 08/29/23 0519  WBC 8.5 8.1 6.6 6.5   Microbiology No results found for this or any previous visit (from the past 240 hours).   Medications:     stroke: early stages of recovery book   Does not apply Once   apixaban  2.5 mg Oral BID   aspirin   81 mg Oral Daily   Or   aspirin   81 mg Per Tube Daily   Chlorhexidine  Gluconate Cloth  6 each Topical Daily   fluorescein   1 strip Both Eyes Once   leptospermum manuka honey  1 Application Topical Daily   mexiletine  150 mg Oral BID   pantoprazole   40 mg Oral QPM   rosuvastatin   5 mg Oral Q Sun   sodium chloride  flush  3-10 mL Intravenous Q12H   tetracaine   1 drop Both Eyes Once   Continuous Infusions:    LOS: 5 days   Brent Ray  [facility]  08/29/2023,  6:14 AM

## 2023-08-29 NOTE — Progress Notes (Signed)
 STROKE TEAM PROGRESS NOTE   SUBJECTIVE (INTERVAL HISTORY)  Patient lying in bed comfortably.  Neurological exam is stable without focal symptoms.   He neurologically stable and wants to go home Vital signs stable.   OBJECTIVE Temp:  [97.7 F (36.5 C)-98.3 F (36.8 C)] 97.7 F (36.5 C) (06/18 0800) Pulse Rate:  [65-77] 70 (06/18 0800) Cardiac Rhythm: Ventricular paced (06/17 2000) Resp:  [10-17] 14 (06/18 0800) BP: (103-150)/(49-72) 137/59 (06/18 0800) SpO2:  [97 %-100 %] 99 % (06/18 0800) Weight:  [54.6 kg] 54.6 kg (06/18 0500)  Recent Labs  Lab 08/25/23 2019  GLUCAP 82   Recent Labs  Lab 08/23/23 0736 08/25/23 0644 08/26/23 1134 08/27/23 0534 08/29/23 0519  NA 139 138 135 138 138  K 3.7 3.3* 3.7 3.7 4.4  CL 111 107 106 111 112*  CO2 17* 19* 17* 19* 19*  GLUCOSE 88 96 91 97 96  BUN 21 23 24* 25* 29*  CREATININE 1.83* 2.24* 2.00* 2.14* 2.35*  CALCIUM  8.6* 9.0 8.8* 8.7* 8.7*  MG 1.9 1.9 1.8 2.0  --   PHOS  --  3.3 2.6 3.0  --    No results for input(s): AST, ALT, ALKPHOS, BILITOT, PROT, ALBUMIN  in the last 168 hours.  Recent Labs  Lab 08/24/23 0647 08/25/23 1608 08/26/23 1134 08/27/23 0534 08/29/23 0519  WBC 4.8 8.5 8.1 6.6 6.5  HGB 12.5* 13.8 12.2* 12.1* 10.5*  HCT 37.5* 40.7 36.6* 35.0* 31.4*  MCV 98.4 98.8 100.0 99.4 101.6*  PLT 222 197 245 237 243   No results for input(s): CKTOTAL, CKMB, CKMBINDEX, TROPONINI in the last 168 hours. Recent Labs    08/27/23 0847  LABPROT 14.3  INR 1.1   No results for input(s): COLORURINE, LABSPEC, PHURINE, GLUCOSEU, HGBUR, BILIRUBINUR, KETONESUR, PROTEINUR, UROBILINOGEN, NITRITE, LEUKOCYTESUR in the last 72 hours.  Invalid input(s): APPERANCEUR     Component Value Date/Time   CHOL 125 08/22/2023 0451   TRIG 43 08/22/2023 0451   HDL 62 08/22/2023 0451   CHOLHDL 2.0 08/22/2023 0451   VLDL 9 08/22/2023 0451   LDLCALC 54 08/22/2023 0451   Lab Results  Component  Value Date   HGBA1C 5.6 08/21/2023      Component Value Date/Time   LABOPIA NONE DETECTED 08/21/2023 1956   COCAINSCRNUR NONE DETECTED 08/21/2023 1956   LABBENZ NONE DETECTED 08/21/2023 1956   AMPHETMU NONE DETECTED 08/21/2023 1956   THCU NONE DETECTED 08/21/2023 1956   LABBARB NONE DETECTED 08/21/2023 1956    No results for input(s): ETH in the last 168 hours.   I have personally reviewed the radiological images below and agree with the radiology interpretations.  IR ANGIO INTRA EXTRACRAN SEL COM CAROTID INNOMINATE BILAT MOD SED Result Date: 08/28/2023 CLINICAL DATA:  Transient effusion, right-sided weakness, and left-sided amaurosis fugax. 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 EXAM: BILATERAL COMMON CAROTID AND INNOMINATE ANGIOGRAPHY COMPARISON:  CT angiogram of the head and neck June 10, and August 25, 2023. MEDICATIONS: Heparin  2000 units IV; Ancef  2 g IV antibiotic was administered within 1 hour of the procedure. ANESTHESIA/SEDATION: General anesthesia. CONTRAST:  Omnipaque  300 approximately 60 mL. FLUOROSCOPY TIME:  Fluoroscopy Time: 6 minutes 36 seconds (380 mGy). COMPLICATIONS: None immediate. TECHNIQUE: Informed written consent was obtained from the patient after a thorough discussion of the procedural risks, benefits and alternatives. All questions were addressed. Maximal Sterile Barrier Technique was utilized including caps, mask, sterile gowns, sterile gloves, sterile drape, hand hygiene and skin antiseptic. A timeout was performed prior  to the initiation of the procedure. The right groin was prepped and draped in the usual sterile fashion. Thereafter using modified Seldinger technique, transfemoral access into the right common femoral artery was obtained without difficulty. Over an 0.035 inch guidewire, an 8 French 25 cm Pinnacle sheath was inserted. Through this, and also over an 0.035 inch guidewire, a 5 [place] JB 1 catheter was advanced to the aortic arch region and  selectively positioned in the right common carotid artery, the innominate artery, the right common carotid artery, the left common carotid artery and the left vertebral artery. FINDINGS: The innominate arteriogram demonstrates right subclavian artery and the proximal right common carotid artery to be widely patent. The right vertebral artery origin is widely patent. The vessel opacifies to the cranial skull base. Patency is seen of the right posterior-inferior cerebellar artery and the right vertebrobasilar junction. The opacified portions of the basilar artery, the posterior cerebral arteries and the superior cerebellar arteries on the lateral projection are grossly patent. The right common carotid arteriogram demonstrates the right external carotid artery and its major branches to be widely patent. Right internal carotid artery at the bulb demonstrates a smooth atherosclerotic plaque along the lateral posterior aspect of the carotid bulb without evidence of intraluminal filling defects. No acute ulcerations are seen. Distal to this, the right ICA opacifies to the cranial skull base. A focal area of mild narrowing interspersed with an area of small focal outpouchings without intrinsic stenosis is seen involving the mid cervical ICA. The petrous, the cavernous and the supraclinoid right ICA are widely patent. The right middle cerebral artery and the right anterior cerebral artery opacify into the capillary and venous phases. Suggestion of a dominant right anterior cerebral A2 segment is seen with both the pericallosal arteries into the capillary and venous phases. The left common carotid arteriogram demonstrates the left external carotid artery to be mildly narrowed at its origin. Its branches opacify normally. The left internal carotid artery at the bulb to the cranial skull base is widely patent. 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 No evidence of intraluminal filling defects or intimal flaps is evident. At the level of the proximal cavernous and caval segment there is an ill-defined area of faint lucency which probably represents an atherosclerotic plaque along the medial aspect best seen on the magnified oblique images, and corresponds to plaque seen on the CT angiogram of the head and neck. The supraclinoid left ICA is widely patent. The left middle cerebral artery opacifies into the capillary and venous phases. The right anterior A1 segment is patent with flow noted into the hypoplastic right anterior cerebral A2 segment as described earlier. The left vertebral artery origin is widely patent. The vessel opacifies normally to the cranial skull base. Patency is seen of the left vertebrobasilar junction and the left posterior-inferior cerebellar artery. The opacified portion of the basilar artery, the posterior cerebral arteries, the superior cerebellar arteries and the anterior-inferior cerebellar arteries demonstrate wide patency into the capillary and venous phases. Unopacified blood is seen in the basilar artery from the contralateral vertebral artery. 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 Mildly decompressed left ICA extra cranially. PLAN: Findings reviewed with  patient and spouse. Findings relayed to the patient's stroke neurologist also. Follow-up as per stroke neurologist. Electronically Signed   By: Luellen Sages M.D.   On: 08/28/2023 08:26   IR ANGIO VERTEBRAL SEL VERTEBRAL UNI L MOD SED Result Date: 08/28/2023 CLINICAL DATA:  Transient effusion, right-sided weakness,  and left-sided amaurosis fugax. 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 EXAM: BILATERAL COMMON CAROTID AND INNOMINATE ANGIOGRAPHY COMPARISON:  CT angiogram of the head and neck June 10, and August 25, 2023. MEDICATIONS: Heparin  2000 units IV; Ancef  2 g IV antibiotic was administered within 1 hour of the procedure. ANESTHESIA/SEDATION: General anesthesia. CONTRAST:  Omnipaque  300 approximately 60 mL. FLUOROSCOPY TIME:  Fluoroscopy Time: 6 minutes 36 seconds (380 mGy). COMPLICATIONS: None immediate. TECHNIQUE: Informed written consent was obtained from the patient after a thorough discussion of the procedural risks, benefits and alternatives. All questions were addressed. Maximal Sterile Barrier Technique was utilized including caps, mask, sterile gowns, sterile gloves, sterile drape, hand hygiene and skin antiseptic. A timeout was performed prior to the initiation of the procedure. The right groin was prepped and draped in the usual sterile fashion. Thereafter using modified Seldinger technique, transfemoral access into the right common femoral artery was obtained without difficulty. Over an 0.035 inch guidewire, an 8 French 25 cm Pinnacle sheath was inserted. Through this, and also over an 0.035 inch guidewire, a 5 [place] JB 1 catheter was advanced to the aortic arch region and selectively positioned in the right common carotid artery, the innominate artery, the right common carotid artery, the left common carotid artery and the left vertebral artery. FINDINGS: The innominate arteriogram demonstrates right subclavian artery and the proximal right common carotid artery to be widely patent. The right vertebral artery origin is widely patent. The vessel opacifies to the cranial skull base. Patency is seen of the right posterior-inferior cerebellar artery and the right vertebrobasilar junction. The opacified portions of the basilar artery, the posterior cerebral arteries and the superior cerebellar arteries  on the lateral projection are grossly patent. The right common carotid arteriogram demonstrates the right external carotid artery and its major branches to be widely patent. Right internal carotid artery at the bulb demonstrates a smooth atherosclerotic plaque along the lateral posterior aspect of the carotid bulb without evidence of intraluminal filling defects. No acute ulcerations are seen. Distal to this, the right ICA opacifies to the cranial skull base. A focal area of mild narrowing interspersed with an area of small focal outpouchings without intrinsic stenosis is seen involving the mid cervical ICA. The petrous, the cavernous and the supraclinoid right ICA are widely patent. The right middle cerebral artery and the right anterior cerebral artery opacify into the capillary and venous phases. Suggestion of a dominant right anterior cerebral A2 segment is seen with both the pericallosal arteries into the capillary and venous phases. The left common carotid arteriogram demonstrates the left external carotid artery to be mildly narrowed at its origin. Its branches opacify normally. The left internal carotid artery at the bulb to the cranial skull base is widely patent. 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 No evidence of intraluminal filling defects or intimal flaps is evident. At the level of the proximal cavernous and caval segment there is an ill-defined area of faint lucency which probably represents an atherosclerotic plaque along the medial aspect best seen on the magnified oblique images, and  corresponds to plaque seen on the CT angiogram of the head and neck. The supraclinoid left ICA is widely patent. The left middle cerebral artery opacifies into the capillary and venous phases. The right anterior A1 segment is patent with flow noted into the  hypoplastic right anterior cerebral A2 segment as described earlier. The left vertebral artery origin is widely patent. The vessel opacifies normally to the cranial skull base. Patency is seen of the left vertebrobasilar junction and the left posterior-inferior cerebellar artery. The opacified portion of the basilar artery, the posterior cerebral arteries, the superior cerebellar arteries and the anterior-inferior cerebellar arteries demonstrate wide patency into the capillary and venous phases. Unopacified blood is seen in the basilar artery from the contralateral vertebral artery. 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 Mildly decompressed left ICA extra cranially. PLAN: Findings reviewed with patient and spouse. Findings relayed to the patient's stroke neurologist also. Follow-up as per stroke neurologist. Electronically Signed   By: Luellen Sages M.D.   On: 08/28/2023 08:26   DG Chest Port 1 View Result Date: 08/26/2023 CLINICAL DATA:  Shortness of breath. EXAM: PORTABLE CHEST 1 VIEW COMPARISON:  07/31/2023 FINDINGS: The lungs are clear without focal pneumonia, edema, pneumothorax or pleural effusion. Cardiopericardial silhouette is at upper limits of normal for size. Left-sided permanent pacemaker/AICD again noted. Telemetry leads overlie the chest. No acute bony abnormality. IMPRESSION: No active disease. Electronically Signed   By: Donnal Fusi M.D.   On: 08/26/2023 06:43   CT ANGIO HEAD NECK W WO CM W PERF (CODE STROKE) Result Date: 08/25/2023 CLINICAL DATA:  Initial evaluation for acute neuro deficit, stroke suspected, right-sided weakness. Aphasia. EXAM: CT ANGIOGRAPHY HEAD AND NECK CT PERFUSION BRAIN TECHNIQUE:  Multidetector CT imaging of the head and neck was performed using the standard protocol during bolus administration of intravenous contrast. Multiplanar CT image reconstructions and MIPs were obtained to evaluate the vascular anatomy. Carotid stenosis measurements (when applicable) are obtained utilizing NASCET criteria, using the distal internal carotid diameter as the denominator. Multiphase CT imaging of the brain was performed following IV bolus contrast injection. Subsequent parametric perfusion maps were calculated using RAPID software. RADIATION DOSE REDUCTION: This exam was performed according to the departmental dose-optimization program which includes automated exposure control, adjustment of the mA and/or kV according to patient size and/or use of iterative reconstruction technique. CONTRAST:  OMNIPAQUE  IOHEXOL  350 MG/ML SOLN COMPARISON:  CT from earlier the same day as well as prior studies from 08/21/2023. FINDINGS: CTA NECK FINDINGS Aortic arch: Visualized aortic arch within normal limits for caliber standard 3 vessel morphology. Aortic atherosclerosis. No significant stenosis about the origin the great vessels. Right carotid system: Right common and internal carotid arteries are patent without dissection. Moderate calcified plaque about the right carotid bulb without hemodynamically significant greater than 50% stenosis. Minimal irregularity about the mid cervical right ICA noted, favored atherosclerotic in nature. Left carotid system: Left common carotid artery remains patent to the bifurcation without stenosis. Atheromatous change about the left carotid bifurcation without hemodynamically significant stenosis. Previously seen left ICA occlusion is no longer seen, with interval revascularization of the left ICA in the neck. Left ICA remains patent the skull base without significant stenosis or evidence for dissection. Vertebral arteries: Both vertebral arteries arise from subclavian arteries.  No proximal subclavian artery stenosis.  Evaluation of vertebral artery somewhat limited by adjacent venous contamination. Tubal arteries M cells are patent without stenosis or dissection. Skeleton: No discrete or worrisome osseous lesions. Moderate cervical spondylosis. Other neck: No other acute finding. Upper chest: Streak artifact from a left-sided pacemaker/AICD. No other acute finding. Review of the MIP images confirms the above findings CTA HEAD FINDINGS Anterior circulation: Atheromatous change about the carotid siphons bilaterally. No significant stenosis on the right. The left ICA is now patent to the terminus. Focal filling defect within the cavernous left ICA at the level of the anterior genu consistent with small volume thrombus (series 5, image 133). This likely reflects a small amount of migrated clot from the neck below given prior left ICA occlusion. A1 segments patent bilaterally. Normal anterior communicating artery complex. Azygous ACA widely patent without stenosis. No M1 stenosis or occlusion. Right MCA branches patent and well perfused. On the left, there is a distal left M3 occlusion, likely embolic (a series 5, image 99). Remainder of the left MCA branches remain patent. Posterior circulation: Both V4 segments patent without stenosis. Both PICA patent at their origins. Basilar patent without stenosis. Superior cerebral arteries patent bilaterally. Atheromatous irregularity about the PCAs with associated mild to moderate bilateral P2 stenoses. PCAs remain patent to their distal aspects. Venous sinuses: Grossly patent allowing for timing the contrast bolus. Anatomic variants: Azygous ACA.  No aneurysm. Review of the MIP images confirms the above findings CT Brain Perfusion Findings: ASPECTS: 10 CBF (<30%) Volume: 0mL Perfusion (Tmax>6.0s) volume: 20mL Mismatch Volume: 20mL Infarction Location:Negative CT perfusion for acute core infarct. 20 mL area of delayed perfusion at the mid-posterior left  frontal lobe, in keeping with the distal left M3 occlusion. No other perfusion abnormality. IMPRESSION: 1. Interval revascularization of the left ICA in the neck, with the left ICA now patent to the terminus. Focal filling defect within the cavernous left ICA at the level of the anterior genu consistent with small volume thrombus, likely migrated clot from the neck below given prior left ICA occlusion. Secondary severe stenosis at this level. 2. Downstream acute distal left M3 occlusion, likely embolic. 3. Negative CT perfusion for acute core infarct. 20 mL area of delayed perfusion at the mid-posterior left frontal lobe, in keeping with the left M3 occlusion. 4. Atheromatous change about the carotid bifurcations and carotid siphons without hemodynamically significant stenosis. 5.  Aortic Atherosclerosis (ICD10-I70.0). Results were discussed by telephone at the time of interpretation on 08/25/2023 at 9:10 p.m. to provider [facility] , who verbally acknowledged these results. Electronically Signed   By: Virgia Griffins M.D.   On: 08/25/2023 21:55   CT HEAD CODE STROKE WO CONTRAST Result Date: 08/25/2023 CLINICAL DATA:  Code stroke. Initial evaluation for acute neuro deficit, stroke suspected. EXAM: CT HEAD WITHOUT CONTRAST TECHNIQUE: Contiguous axial images were obtained from the base of the skull through the vertex without intravenous contrast. RADIATION DOSE REDUCTION: This exam was performed according to the departmental dose-optimization program which includes automated exposure control, adjustment of the mA and/or kV according to patient size and/or use of iterative reconstruction technique. COMPARISON:  Prior studies from 08/21/2023 FINDINGS: Brain: Mild age-related cerebral atrophy with chronic small vessel ischemic disease. Small remote right cerebellar infarct. No acute intracranial hemorrhage. No acute large vessel territory infarct. No mass lesion or midline shift. No hydrocephalus or extra-axial  fluid collection. Vascular: No abnormal hyperdense vessel. Calcified atherosclerosis present at the skull base. Skull: Scalp soft tissues within normal limits.  Calvarium intact. Sinuses/Orbits: Globes  orbital soft tissues within normal limits. Paranasal sinuses are clear. No mastoid effusion. Other: None. ASPECTS [facility] Stroke Program Early CT Score) - Ganglionic level infarction (caudate, lentiform nuclei, internal capsule, insula, M1-M3 cortex): 7 - Supraganglionic infarction (M4-M6 cortex): 3 Total score (0-10 with 10 being normal): 10 IMPRESSION: 1. No acute intracranial abnormality. 2. ASPECTS is 10. 3. 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 These results were communicated to Dr. Arora at 8:52 pm on 08/25/2023 by text page via the [facility] messaging system. Electronically Signed   By: Virgia Griffins M.D.   On: 08/25/2023 20:55   MR BRAIN WO CONTRAST Result Date: 08/23/2023 CLINICAL DATA:  Provided history: Vision changes. Headache. Dizziness. EXAM: MRI HEAD WITHOUT CONTRAST TECHNIQUE: Multiplanar, multiecho pulse sequences of the brain and surrounding structures were obtained without intravenous contrast. COMPARISON:  Non-contrast head CT, CT angiogram head/neck and CT perfusion head 08/21/2023. FINDINGS: Brain: Mild generalized cerebral atrophy. 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 Multifocal T2 FLAIR hyperintense signal abnormality within the cerebral white matter, nonspecific but compatible with moderate chronic small vessel ischemic disease. Mild chronic small vessel ischemic changes within the pons. Small T2 hyperintense focus within the right caudate head likely reflecting a prominent perivascular space. Prominent perivascular space also present within the right basal ganglia inferiorly. 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 Nonspecific  punctate chronic microhemorrhage within medial the right cerebellar hemisphere. No evidence of an intracranial mass. No extra-axial fluid collection. No midline shift. 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 Skull and upper cervical spine: No focal worrisome marrow lesion. Sinuses/Orbits: No mass or acute finding within the imaged orbits. Prior but ocular lens replacement. No significant paranasal sinus disease. IMPRESSION: 1. 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 2. Known occlusion of the left internal carotid artery. 3. Chronic small vessel ischemic changes which are moderate in the cerebral white matter and mild in the pons. 4. Small chronic infarct within the right cerebellar hemisphere. 5. Mild generalized cerebral atrophy. Electronically Signed   By: Bascom Lily D.O.   On: 08/23/2023 13:22   CUP PACEART REMOTE DEVICE CHECK Result Date: 08/22/2023 ICD Scheduled remote reviewed. Normal device function.  Presenting rhythm: BIV paced HF diagnostics abnormal this monitoring period. BIV pacing at 91%; stable. No new events since 4/4 programmer session, prior to that 6 NSVT events, longest x 4 sec; V rates >200 bpm.  Likely ventricular driven. Next remote 91 days. [place], [facility]  ECHOCARDIOGRAM COMPLETE Result Date: 08/22/2023    ECHOCARDIOGRAM REPORT   Patient Name:   Brent Ray. Date of Exam: 08/22/2023 Medical Rec #:  [id number]            Height:       68.0 in Accession #:    [id number]           Weight:       123.4 lb Date of Birth:  [birth year]            BSA:          1.665 m Patient Age:    77 years             BP:           142/87 mmHg Patient Gender: M  HR:           83 bpm. Exam Location:  [facility] Procedure: 2D Echo, Cardiac Doppler, Color Doppler and Strain Analysis (Both            Spectral and Color Flow Doppler were utilized during  procedure). Indications:    stroke I63.9  History:        Patient has prior history of Echocardiogram examinations, most                 recent 01/29/2023. CHF, CAD; Risk Factors:Hypertension and                 Dyslipidemia.  Sonographer:    Kip Peon RDCS Referring Phys: [id number]nie Lao [facility]  Sonographer Comments: Global longitudinal strain was attempted. IMPRESSIONS  1. Left ventricular ejection fraction, by estimation, is 35 to 40%. The left ventricle has moderately decreased function. The left ventricle demonstrates regional wall motion abnormalities (see scoring diagram/findings for description). There is mild concentric left ventricular hypertrophy. Left ventricular diastolic parameters are indeterminate.  2. Right ventricular systolic function is normal. The right ventricular size is normal. There is mildly elevated pulmonary artery systolic pressure. The estimated right ventricular systolic pressure is 38.0 mmHg.  3. Left atrial size was moderately dilated.  4. The mitral valve is degenerative with mild posterior leaflet prolapse. Moderate to severe mitral valve regurgitation.  5. Tricuspid valve regurgitation is mild to moderate.  6. The aortic valve is tricuspid. There is mild calcification of the aortic valve. Aortic valve regurgitation is trivial. No aortic stenosis is present.  7. The inferior vena cava is normal in size with greater than 50% respiratory variability, suggesting right atrial pressure of 3 mmHg.  8. Evidence of probable atrial level shunting detected by color flow Doppler, left to right at mid interatrial septum. Suggest agitated saline study. Comparison(s): Prior images reviewed side by side. LVEF 35-40% with stable WMA and septal motion consistent with RV pacing. Mild posterior leaflet mitral prolapse with moderate to severe mtiral regurgitation. 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 FINDINGS  Left Ventricle: Left ventricular ejection fraction,  by estimation, is 35 to 40%. The left ventricle has moderately decreased function. The left ventricle demonstrates regional wall motion abnormalities. The left ventricular internal cavity size was normal in size. There is mild concentric left ventricular hypertrophy. Abnormal (paradoxical) septal motion, consistent with RV pacemaker. Left ventricular diastolic parameters are indeterminate.  LV Wall Scoring: The entire inferior wall, posterior wall, and basal anterolateral segment are hypokinetic. The entire anterior wall, entire septum, apical lateral segment, mid anterolateral segment, and apex are normal. Right Ventricle: The right ventricular size is normal. No increase in right ventricular wall thickness. Right ventricular systolic function is normal. There is mildly elevated pulmonary artery systolic pressure. The tricuspid regurgitant velocity is 2.96  m/s, and with an assumed right atrial pressure of 3 mmHg, the estimated right ventricular systolic pressure is 38.0 mmHg. Left Atrium: Left atrial size was moderately dilated. Right Atrium: Right atrial size was normal in size. Pericardium: There is no evidence of pericardial effusion. Mitral Valve: The mitral valve is degenerative in appearance. There is mild thickening of the mitral valve leaflet(s). Moderate to severe mitral valve regurgitation, with posteriorly-directed jet. Tricuspid Valve: The tricuspid valve is grossly normal. Tricuspid valve regurgitation is mild to moderate. Aortic Valve: The aortic valve is tricuspid. There is mild calcification of the aortic valve. There is mild aortic valve annular calcification. Aortic valve  regurgitation is trivial. No aortic stenosis is present. Pulmonic Valve: The pulmonic valve was grossly normal. Pulmonic valve regurgitation is trivial. Aorta: The aortic root and ascending aorta are structurally normal, with no evidence of dilitation. Venous: The inferior vena cava is normal in size with greater than 50%  respiratory variability, suggesting right atrial pressure of 3 mmHg. IAS/Shunts: Evidence of atrial level shunting detected by color flow Doppler. Additional Comments: 3D was performed not requiring image post processing on an independent workstation and was indeterminate. A device lead is visualized.  LEFT VENTRICLE PLAX 2D LVIDd:         5.10 cm LVIDs:         4.40 cm LV PW:         1.40 cm 3D Volume EF LV IVS:        1.00 cm LV 3D EDV:   123.82 ml                        LV 3D ESV:   48.23 ml RIGHT VENTRICLE          IVC RV Basal diam:  3.70 cm  IVC diam: 1.60 cm LEFT ATRIUM             Index        RIGHT ATRIUM           Index LA diam:        4.10 cm 2.46 cm/m   RA Area:     19.90 cm LA Vol (A2C):   58.0 ml 34.84 ml/m  RA Volume:   50.80 ml  30.52 ml/m LA Vol (A4C):   84.5 ml 50.76 ml/m LA Biplane Vol: 73.4 ml 44.10 ml/m  AORTIC VALVE LVOT Vmax:   75.95 cm/s LVOT Vmean:  46.700 cm/s LVOT VTI:    0.125 m  AORTA Ao Asc diam: 3.20 cm MITRAL VALVE               TRICUSPID VALVE MV Area (PHT): 3.12 cm    TR Peak grad:   35.0 mmHg MV Decel Time: 243 msec    TR Vmax:        296.00 cm/s MR Peak grad: 87.0 mmHg MR Mean grad: 62.0 mmHg    SHUNTS MR Vmax:      466.33 cm/s  Systemic VTI: 0.12 m MR Vmean:     367.0 cm/s MV E velocity: 70.60 cm/s MV A velocity: 30.70 cm/s MV E/A ratio:  2.30 Teddie Favre MD Electronically signed by Teddie Favre MD Signature Date/Time: 08/22/2023/11:28:01 AM    Final    CT CEREBRAL PERFUSION W CONTRAST Result Date: 08/21/2023 CLINICAL DATA:  Initial evaluation for acute visual disturbance. EXAM: CT PERFUSION BRAIN TECHNIQUE: Multiphase CT imaging of the brain was performed following IV bolus contrast injection. Subsequent parametric perfusion maps were calculated using RAPID software. RADIATION DOSE REDUCTION: This exam was performed according to the departmental dose-optimization program which includes automated exposure control, adjustment of the mA and/or kV according to  patient size and/or use of iterative reconstruction technique. CONTRAST:  32mL OMNIPAQUE  IOHEXOL  350 MG/ML SOLN COMPARISON:  Prior CTs from earlier the same day. FINDINGS: CT Brain Perfusion Findings: CBF (<30%) Volume: 0mL Perfusion (Tmax>6.0s) volume: 0mL Mismatch Volume: 0mL ASPECTS on noncontrast CT Head: 10 at 5:43 p.m. today. Infarct Core: 0 mL Infarction Location:Negative CT perfusion for acute ischemia. Mildly delayed perfusion of T-max greater than 4 seconds in the watershed zones of the left cerebral hemispheres  compared to the right, presumably related to the occluded left ICA as seen on prior CTA. IMPRESSION: 1. Negative CT perfusion for acute ischemia. 2. Mildly delayed perfusion of T-max greater than 4 seconds in the watershed zones of the left cerebral hemisphere compared to the right, presumably related to the occluded left ICA as seen on prior CTA. Electronically Signed   By: Virgia Griffins M.D.   On: 08/21/2023 21:04   CT ANGIO HEAD NECK W WO CM Addendum Date: 08/21/2023 ADDENDUM REPORT: 08/21/2023 19:32 ADDENDUM: CTA neck impression #1 and CTA head impression #1 called by telephone on 08/21/2023 at 7:14 pm to provider Dr. Alecia Ames, who verbally acknowledged these results. Electronically Signed   By: Bascom Lily D.O.   On: 08/21/2023 19:32   Result Date: 08/21/2023 CLINICAL DATA:  Provided history: Neuro deficit, acute, stroke suspected. Left-sided headache, vision changes, dizziness. EXAM: CT ANGIOGRAPHY HEAD AND NECK WITH AND WITHOUT CONTRAST TECHNIQUE: Multidetector CT imaging of the head and neck was performed using the standard protocol during bolus administration of intravenous contrast. Multiplanar CT image reconstructions and MIPs were obtained to evaluate the vascular anatomy. Carotid stenosis measurements (when applicable) are obtained utilizing NASCET criteria, using the distal internal carotid diameter as the denominator. RADIATION DOSE REDUCTION: This exam was performed  according to the departmental dose-optimization program which includes automated exposure control, adjustment of the mA and/or kV according to patient size and/or use of iterative reconstruction technique. CONTRAST:  60mL OMNIPAQUE  IOHEXOL  350 MG/ML SOLN COMPARISON:  Non-contrast head CT performed earlier today 08/21/2023. FINDINGS: CTA NECK FINDINGS Aortic arch: Standard aortic branching. Atherosclerotic plaque within the visualized aortic arch and proximal major branch vessels of the neck. No hemodynamically significant innominate or proximal subclavian artery stenosis. Right carotid system: CCA and ICA patent within the neck. Atherosclerotic plaque about the carotid bifurcation, within the proximal ICA and within the proximal ECA. Estimated 50% stenosis at the ICA origin. Severe stenosis of the proximal ECA. Left carotid system: The common carotid artery is patent within the neck. The internal carotid artery becomes occluded shortly beyond its origin and remains occluded throughout the remainder of the neck. Calcified atherosclerotic plaque at the ECA origin resulting in at least moderate stenosis. Vertebral arteries: Codominant and patent within the neck. Scattered atherosclerotic plaque within the cervical left vertebral artery. Most notably, calcified plaque within the proximal V3 segment results in up to moderate stenosis. Skeleton: Dextrocurvature of the cervical spine. Cervical spondylosis. No acute fracture or aggressive osseous lesion. Other neck: No neck mass or cervical lymphadenopathy. Upper chest: No consolidation within the imaged lung apices. Review of the MIP images confirms the above findings CTA HEAD FINDINGS Anterior circulation: The intracranial left internal carotid artery remains occluded to the paraclinoid level. Reconstitution of enhancement within the supraclinoid left ICA. The intracranial right internal carotid artery is patent. Calcified atherosclerotic plaque within this vessel with no  more than mild stenosis. The M1 middle cerebral arteries are patent. Atherosclerotic irregularity of the M2 and more distal MCA branches. No M2 proximal branch occlusion or high-grade proximal stenosis. The anterior cerebral arteries are patent. Azygous configuration of the anterior cerebral arteries. Mildly hypoplastic left A1 segment. No intracranial aneurysm is identified. Posterior circulation: The intracranial vertebral arteries are patent. The basilar artery is patent. The posterior cerebral arteries are patent. Atherosclerotic irregularity of both vessels. Most notably, there is a moderate/severe stenosis within the left PCA P2 segment. A left posterior communicating artery is present. The right posterior communicating artery is diminutive or  absent. Venous sinuses: Within the limitations of contrast timing, no convincing thrombus. Anatomic variants: As described grade Review of the MIP images confirms the above findings Attempts are being made to reach the ordering provider at this time. IMPRESSION: CTA neck: 1. 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 This is age-indeterminate. 2. The right common and internal carotid arteries are patent within the neck. Estimated 50% atherosclerotic narrowing at the right ICA origin. 3. Vertebral arteries patent within the neck. Up to moderate atherosclerotic narrowing of the left vertebral artery V3 segment. 4. Aortic Atherosclerosis (ICD10-I70.0). CTA head: 1. 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 This is age-indeterminate. Reconstitution of enhancement within the supraclinoid left ICA. 2. No proximal intracranial large vessel occlusion identified elsewhere. 3. Background intracranial atherosclerotic disease as described. Most notably, there is a moderate/severe stenosis within the left PCA P2 segment. Electronically Signed: By: Bascom Lily D.O. On: 08/21/2023 19:04   CT HEAD CODE STROKE WO CONTRAST Result Date: 08/21/2023 CLINICAL DATA:  Code stroke. Neuro deficit, acute, stroke suspected. Dizziness. Eye pain on the left, headache, nausea. EXAM: CT HEAD WITHOUT CONTRAST TECHNIQUE: Contiguous axial images were obtained from the base of the skull through the vertex without intravenous contrast. RADIATION DOSE REDUCTION: This exam was performed according to the departmental dose-optimization program which includes automated exposure control, adjustment of the mA and/or kV according to patient size and/or use of iterative reconstruction technique. COMPARISON:  Head CT 08/08/2023. FINDINGS: Brain: Generalized cerebral atrophy. Prominent perivascular spaces within the right basal ganglia inferiorly. Small chronic infarct again demonstrated within the inferior right cerebellar hemisphere. There is no acute intracranial hemorrhage. No demarcated cortical infarct. No extra-axial fluid collection. No evidence of an intracranial mass. No midline shift. Vascular: No hyperdense vessel. Atherosclerotic calcifications. Skull: No calvarial fracture or aggressive osseous lesion. Sinuses/Orbits: No mass or acute finding within the imaged orbits. No significant paranasal sinus disease at the imaged levels. ASPECTS [facility] Stroke Program Early CT Score) - Ganglionic level infarction (caudate, lentiform nuclei, internal capsule, insula, M1-M3 cortex): 7 - Supraganglionic infarction (M4-M6 cortex): 3 Total score (0-10 with 10 being normal): 10 No evidence of an acute intracranial abnormality. These results were communicated to Dr. Alecia Ames at 5:55 pmon 6/10/2025by text page via the [facility] messaging system. IMPRESSION: 1.  No evidence of an acute intracranial abnormality. 2. 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 3. Cerebral atrophy. Electronically Signed   By: Bascom Lily D.O.   On: 08/21/2023 17:56   CT Cervical  Spine Wo Contrast Result Date: 08/08/2023 CLINICAL DATA:  Neck trauma (Age >= 65y) EXAM: CT CERVICAL SPINE WITHOUT CONTRAST TECHNIQUE: Multidetector CT imaging of the cervical spine was performed without intravenous contrast. Multiplanar CT image reconstructions were also generated. RADIATION DOSE REDUCTION: This exam was performed according to the departmental dose-optimization program which includes automated exposure control, adjustment of the mA and/or kV according to patient size and/or use of iterative reconstruction technique. COMPARISON:  None Available. FINDINGS: Alignment: Straightening of normal lordosis. No traumatic subluxation. Skull base and vertebrae: No acute fracture. Vertebral body heights are maintained. Schmorl's node within inferior C7. The dens and skull base are intact. Soft tissues and spinal canal: No prevertebral fluid or swelling. No visible canal hematoma. Disc levels: Disc space narrowing and anterior spurring C4-C5, C5-C6, C6-C7 and C7-T1. Upper chest: No acute findings. Biapical pleuroparenchymal scarring. Other: Carotid calcifications. IMPRESSION:  1. No acute fracture or subluxation of the cervical spine. 2. Multilevel degenerative disc disease. Electronically Signed   By: Chadwick Colonel M.D.   On: 08/08/2023 17:33   DG Elbow 2 Views Left Result Date: 08/08/2023 CLINICAL DATA:  Pain after fall. EXAM: LEFT ELBOW - 2 VIEW COMPARISON:  None Available. FINDINGS: There is no evidence of fracture or dislocation. Right elevated anterior fat pad but no posterior fat pad elevation. There is no evidence of arthropathy or other focal bone abnormality. Soft tissues are unremarkable. IMPRESSION: 1. Possible small joint effusion. 2. No evidence of fracture or dislocation. If symptoms persist, recommend follow-up radiographs in 7-10 days. Electronically Signed   By: Chadwick Colonel M.D.   On: 08/08/2023 17:26   CT Head Wo Contrast Result Date: 08/08/2023 CLINICAL DATA:  Head trauma, minor  (Age >= 65y) EXAM: CT HEAD WITHOUT CONTRAST TECHNIQUE: Contiguous axial images were obtained from the base of the skull through the vertex without intravenous contrast. RADIATION DOSE REDUCTION: This exam was performed according to the departmental dose-optimization program which includes automated exposure control, adjustment of the mA and/or kV according to patient size and/or use of iterative reconstruction technique. COMPARISON:  None Available. FINDINGS: Brain: No intracranial hemorrhage, mass effect, or midline shift. Generalized atrophy. No hydrocephalus. The basilar cisterns are patent. Perivascular space versus remote lacunar infarct in the right basal ganglia. Remote right cerebellar lacunar infarct. No evidence of territorial infarct or acute ischemia. No extra-axial or intracranial fluid collection. Vascular: Atherosclerosis of skullbase vasculature without hyperdense vessel or abnormal calcification. Skull: No fracture or focal lesion. Sinuses/Orbits: Paranasal sinuses and mastoid air cells are clear. The visualized orbits are unremarkable. Other: No confluent scalp hematoma. IMPRESSION: 1. No acute intracranial abnormality. No skull fracture. 2. Generalized atrophy. Remote right cerebellar lacunar infarct. Electronically Signed   By: Chadwick Colonel M.D.   On: 08/08/2023 17:24   DG Chest 2 View Result Date: 07/31/2023 CLINICAL DATA:  Shortness of breath. EXAM: CHEST - 2 VIEW COMPARISON:  02/02/2016. FINDINGS: Bilateral lung fields are clear. Bilateral costophrenic angles are clear. Normal cardio-mediastinal silhouette. There is a left sided 3-lead pacemaker. No acute osseous abnormalities. The soft tissues are within normal limits. IMPRESSION: *No active cardiopulmonary disease. Electronically Signed   By: [name] M.D.   On: 07/31/2023 14:19     PHYSICAL EXAM  Temp:  [97.7 F (36.5 C)-98.3 F (36.8 C)] 97.7 F (36.5 C) (06/18 0800) Pulse Rate:  [65-77] 70 (06/18 0800) Resp:   [10-17] 14 (06/18 0800) BP: (103-150)/(49-72) 137/59 (06/18 0800) SpO2:  [97 %-100 %] 99 % (06/18 0800) Weight:  [54.6 kg] 54.6 kg (06/18 0500)  General - Well nourished, well developed pleasant elderly Caucasian male, in no apparent distress.  Ophthalmologic - fundi not visualized due to noncooperation.  Cardiovascular - Regular rhythm and rate.  Mental Status -  Level of arousal and orientation to time, place, and person were intact. Language including expression, naming, repetition, comprehension was assessed and found intact. Fund of Knowledge was assessed and was intact.  Cranial Nerves II - XII - II - Visual field intact OU. III, IV, VI - Extraocular movements intact. V - Facial sensation intact bilaterally. VII - Facial movement intact bilaterally. VIII - Hearing & vestibular intact bilaterally. X - Palate elevates symmetrically. XI - Chin turning & shoulder shrug intact bilaterally. XII - Tongue protrusion intact.  Motor Strength - The patient's strength was normal in all extremities and pronator drift was absent.  Bulk was normal  and fasciculations were absent.   Motor Tone - Muscle tone was assessed at the neck and appendages and was normal.  Reflexes - The patient's reflexes were symmetrical in all extremities and he had no pathological reflexes.  Sensory - Light touch, temperature/pinprick were assessed and were symmetrical.    Coordination - The patient had normal movements in the hands and feet with no ataxia or dysmetria.  Tremor was absent.  Gait and Station - deferred.   ASSESSMENT/PLAN Mr. Katherine Syme. is a 78 y.o. male with history of CHF, PAF on Xarelto , RA, AICD, CKD 3, hypertension, CAD, recent fall admitted for headache, dizziness, nausea vomiting, transient left eye vision loss. No TNK given due to symptom resolved.    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  6/10 acute onset pain radiating from lower jaw to the frontal head on the left with left painless vision loss.  The pain gradually eased off in 2 to 3 hours, left vision loss resumed within 3 to 4 hours. 6/14 stroke code due to new aphasia. Symptoms resolved later, MRI negative for acute stroke but angiogram showed new M3 occlusion.  CT no acute abnormality, old right cerebellar infarct CT head and neck left ICA occlusion, reconstituted at siphon.  Right ICA 50% stenosis, left P2 moderate to severe stenosis CT perfusion negative MRI no acute infarct Cerebral catheter angiogram 08/27/2023 shows interval recanalization of the left M3 and carotid occlusion. 2D Echo EF 35 to 40% LDL 54 HgbA1c 5.6 UDS negative Xarelto  15 for VTE prophylaxis Xarelto  (rivaroxaban ) daily prior to admission, now recommend switching to Eliquis and aspirin  81 mg daily into 3 months  and then Eliquis alone Ongoing aggressive stroke risk factor management Therapy recommendations: Outpatient PT Disposition: Pending  PAF on [facility] Home Xarelto  15 Compliant with medication Currently on heparin  IV in preparation for cerebral angiogram  CHF 01/2023 EF 30 to 35% 07/31/2023 admitted for acute on chronic heart failure This admission EF 35 to 40% On Lasix  40 daily  Hypertension Stable Avoid low BP given left ICA occlusion Long term BP goal normotensive  Hyperlipidemia Home meds: Crestor  5 LDL 54, goal < 70 Now on Crestor  5 No high intensity statin given LDL at goal Continue statin at discharge  Other Stroke Risk Factors Advanced age Coronary artery disease  Other Active Problems AICD placement Rheumatoid arthritis CKD 3, creatinine 1.84--2.12--1.83 Recent diarrhea  Hospital day # 5 Patient is doing well.  Wants  to go home.  He has been switched to Eliquis and aspirin .   Recommend DC home.  Follow-up as outpatient stroke clinic in 2 months.  Ardella Beaver, MD Medical Director [facility] Pager: [phone] 08/29/2023 12:46 PM  08/29/2023 12:46 PM    To contact Stroke Continuity provider, please refer to [email or web address]. After hours, contact General Neurology

## 2023-08-29 NOTE — Discharge Summary (Signed)
 Physician Discharge Summary  Brent Ray. ZOX:[id number] DOB: [birth year] DOA: 08/21/2023  PCP: Tressie Fryer, MD  Admit date: 08/21/2023 Discharge date: 08/29/2023  Admitted From: Home Disposition:  Home  Recommendations for Outpatient Follow-up:  Follow up with PCP in 1-2 weeks Please obtain BMP/CBC in one week   Home Health:Yes Equipment/Devices:No  Discharge Condition:Stable ODE STATUS:Full Diet recommendation: Heart Healthy ia   Brief/Interim Summary: Expand All Collapse All  [facility] PROGRESS NOTE      Progress Note  Brent Ray.  WJX:[id number] DOB: 1945/07/24 DOA: 08/21/2023 PCP: Tressie Fryer, MD                 Brief Narrative:    Brent Ray. is an 78 y.o. male past medical history paroxysmal atrial fibrillation on Xarelto , chronic diastolic dysfunction, bradycardia status post AICD, essential hypertension diabetes mellitus chronic kidney disease stage IIIb, with subclavian thrombosis initially presented in pain ED on 08/21/2023 complaining of dizziness and headaches, CT of the head showed left internal carotid artery.  Neurology was consulted and will be transferred to [facility] CT perfusion can was negative.   Significant Events: 6/10: [facility] for stroke symptoms. 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 CT perfusion study with no perfusion deficit. Admitted to hospitalist.  6/11: transferred to [facility]; symptoms are resolved  6/12: MRI brain with in left occipital lobe, known left ICA occlusion > heparin  started. Neurology spoke with [facility] and agree to cerebral angiogram and possible intervention  6/14: acute aphasia, right sided weakness LKW 1930 > CT head with no acute findings or bleed. CTA with similar findings @ ICU and possible left M3 distal segment occlusion  6/16: NIR with complete revascularization of left ICA extra and intracranially, complete revascularization of M3 region > admit to ICU for 24 hours post procedurally. 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 Neurology was consulted recommended angiogram in 08/27/2023 that showed interval recanalization of the left M3 and carotid occlusion. 2D echo showed an EF of 35%. A1c was 5.6, LDL 54, UDS negative. UDS was negative. Initially started on IV heparin  then transition to oral Eliquis plus aspirin  3 months then Eliquis alone. Physical therapy recommended outpatient PT.   Paroxysmal atrial fibrillation/sinus bradycardia with a history of an AICD placement At home she was on Xarelto  now transition to oral Eliquis. EF of 35% with moderate to severe mitral regurg. No events on telemetry.   Chronic systolic heart failure: With an EF of 30 to 35%. Continue Lasix  daily.   Essential hypertension: Avoid low BP due to her left ICA occlusion.   Hyperlipidemia: Continue Crestor .   Right wrist hematoma at the anterior puncture site: Stop with local pressure.   Chronic kidney disease stage IIIb: His creatinine appears to be at baseline.   Rheumatoid arthritis: Continue methotrexate  home dose.  Discharge Instructions  Discharge Instructions     Diet - low sodium heart healthy  Complete by: As directed    Discharge wound care:   Complete by: As directed    Continue per wound care instructions   Increase activity slowly   Complete by: As directed       Allergies as of 08/29/2023       Reactions   Amiodarone Other (See  Comments)   Pt reports vision changes and walking changes    Flecainide  Other (See Comments)   Severe headaches, blurred vision   Carvedilol  Other (See Comments)   Unknown    Dapagliflozin Diarrhea   Eplerenone Other (See Comments)   Unknown    Flomax [tamsulosin Hcl] Hives, Itching, Rash   Lisinopril  Nausea And Vomiting   Metoprolol  Other (See Comments)   Headaches, dizziness   Sacubitril-valsartan Nausea And Vomiting   Spironolactone  Swelling, Other (See Comments)   Breast swelling, headaches   Statins Other (See Comments)   Leg cramps   Sulfasalazine Nausea Only        Medication List     STOP taking these medications    cephALEXin 500 MG capsule Commonly known as: KEFLEX   losartan  25 MG tablet Commonly known as: COZAAR    Xarelto  15 MG Tabs tablet Generic drug: Rivaroxaban        TAKE these medications    apixaban 2.5 MG Tabs tablet Commonly known as: ELIQUIS Take 1 tablet (2.5 mg total) by mouth 2 (two) times daily.   aspirin  81 MG chewable tablet Chew 1 tablet (81 mg total) by mouth daily.   CRANBERRY PO Take 500 mg by mouth daily.   folic acid  1 MG tablet Commonly known as: FOLVITE  Take 1 mg by mouth daily.  NOT on Sunday d/t methotrexate    furosemide  20 MG tablet Commonly known as: LASIX  Take 1 tablet (20 mg total) by mouth daily. What changed: when to take this   methotrexate  2.5 MG tablet Commonly known as: RHEUMATREX Take 2.5 mg by mouth every Friday. 6 pills weekly   mexiletine 150 MG capsule Commonly known as: MEXITIL  Take 150 mg by mouth 2 (two) times daily.   Nitrostat  0.4 MG SL tablet Generic drug: nitroGLYCERIN  DISSOLVE 1 TABLET UNDER TONGUE EVERY 5 MINUTES AS NEEDED FOR CHEST PAIN What changed: See the new instructions.   ondansetron  4 MG tablet Commonly known as: ZOFRAN  Take 4 mg by mouth every 8 (eight) hours as needed for nausea.   pantoprazole  40 MG tablet Commonly known as: PROTONIX  Take 40 mg by mouth every  evening.   potassium chloride  SA 20 MEQ tablet Commonly known as: KLOR-CON  M Take 20 mEq by mouth daily.   PROBIOTIC DAILY PO Take 110 mg by mouth daily.   rosuvastatin  5 MG tablet Commonly known as: CRESTOR  Take 5 mg by mouth every Sunday.               Discharge Care Instructions  (From admission, onward)           Start     Ordered   08/29/23 0000  Discharge wound care:       Comments: Continue per wound care instructions   08/29/23 0941            Follow-up Information     [facility] Follow up.   Specialty: Rehabilitation Why: Please call to schedule your outpatient physical therapy Contact information: [address] [place]  [address] [phone]               Allergies  Allergen Reactions   Amiodarone Other (See Comments)    Pt reports vision changes and walking changes    Flecainide  Other (See Comments)    Severe headaches, blurred vision   Carvedilol  Other (See Comments)    Unknown    Dapagliflozin Diarrhea   Eplerenone Other (See Comments)    Unknown    Flomax [Tamsulosin Hcl] Hives, Itching and Rash   Lisinopril  Nausea And Vomiting   Metoprolol  Other (See Comments)    Headaches, dizziness   Sacubitril-Valsartan Nausea And Vomiting   Spironolactone  Swelling and Other (See Comments)    Breast swelling, headaches   Statins Other (See Comments)    Leg cramps   Sulfasalazine Nausea Only    Consultations: Neurology   Procedures/Studies: IR ANGIO INTRA EXTRACRAN SEL COM CAROTID INNOMINATE BILAT MOD SED Result Date: 08/28/2023 CLINICAL DATA:  Transient effusion, right-sided weakness, and left-sided amaurosis fugax. 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 EXAM: BILATERAL COMMON CAROTID AND INNOMINATE ANGIOGRAPHY COMPARISON:  CT angiogram of the head and neck June 10, and August 25, 2023. MEDICATIONS: Heparin  2000 units IV; Ancef  2 g IV antibiotic was  administered within 1 hour of the procedure. ANESTHESIA/SEDATION: General anesthesia. CONTRAST:  Omnipaque  300 approximately 60 mL. FLUOROSCOPY TIME:  Fluoroscopy Time: 6 minutes 36 seconds (380 mGy). COMPLICATIONS: None immediate. TECHNIQUE: Informed written consent was obtained from the patient after a thorough discussion of the procedural risks, benefits and alternatives. All questions were addressed. Maximal Sterile Barrier Technique was utilized including caps, mask, sterile gowns, sterile gloves, sterile drape, hand hygiene and skin antiseptic. A timeout was performed prior to the initiation of the procedure. The right groin was prepped and draped in the usual sterile fashion. Thereafter using modified Seldinger technique, transfemoral access into the right common femoral artery was obtained without difficulty. Over an 0.035 inch guidewire, an 8 French 25 cm Pinnacle sheath was inserted. Through this, and also over an 0.035 inch guidewire, a 5 [place] JB 1 catheter was advanced to the aortic arch region and selectively positioned in the right common carotid artery, the innominate artery, the right common carotid artery, the left common carotid artery and the left vertebral artery. FINDINGS: The innominate arteriogram demonstrates right subclavian artery and the proximal right common carotid artery to be widely patent. The right vertebral artery origin is widely patent. The vessel opacifies to the cranial skull base. Patency is seen of the right posterior-inferior cerebellar artery and the right vertebrobasilar junction. The opacified portions of the basilar artery, the posterior cerebral arteries and the superior cerebellar arteries on the lateral projection are grossly patent. The right common carotid arteriogram demonstrates the right external carotid artery and its major branches to be widely patent. Right internal carotid artery at the bulb demonstrates a smooth atherosclerotic plaque along the lateral  posterior aspect of the carotid bulb without evidence of intraluminal filling defects. No acute ulcerations are seen. Distal to this, the right ICA opacifies to the cranial skull base. A focal area of mild narrowing interspersed with an area of small focal outpouchings without intrinsic stenosis is seen involving the mid cervical ICA. The petrous, the cavernous and the supraclinoid right ICA are widely patent. The right middle cerebral artery and the right anterior cerebral artery opacify into the capillary and venous phases. Suggestion of a dominant right anterior cerebral A2 segment is seen with both the pericallosal arteries into the capillary and venous phases. The left common carotid arteriogram demonstrates the left external carotid artery to be mildly narrowed at  its origin. Its branches opacify normally. The left internal carotid artery at the bulb to the cranial skull base is widely patent. 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 No evidence of intraluminal filling defects or intimal flaps is evident. At the level of the proximal cavernous and caval segment there is an ill-defined area of faint lucency which probably represents an atherosclerotic plaque along the medial aspect best seen on the magnified oblique images, and corresponds to plaque seen on the CT angiogram of the head and neck. The supraclinoid left ICA is widely patent. The left middle cerebral artery opacifies into the capillary and venous phases. The right anterior A1 segment is patent with flow noted into the hypoplastic right anterior cerebral A2 segment as described earlier. The left vertebral artery origin is widely patent. The vessel opacifies normally to the cranial skull base. Patency is seen of the left vertebrobasilar junction and the left posterior-inferior cerebellar artery.  The opacified portion of the basilar artery, the posterior cerebral arteries, the superior cerebellar arteries and the anterior-inferior cerebellar arteries demonstrate wide patency into the capillary and venous phases. Unopacified blood is seen in the basilar artery from the contralateral vertebral artery. 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 Mildly decompressed left ICA extra cranially. PLAN: Findings reviewed with patient and spouse. Findings relayed to the patient's stroke neurologist also. Follow-up as per stroke neurologist. Electronically Signed   By: Luellen Sages M.D.   On: 08/28/2023 08:26   IR ANGIO VERTEBRAL SEL VERTEBRAL UNI L MOD SED Result Date: 08/28/2023 CLINICAL DATA:  Transient effusion, right-sided weakness, and left-sided amaurosis fugax. 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 EXAM: BILATERAL COMMON CAROTID AND INNOMINATE ANGIOGRAPHY COMPARISON:  CT angiogram of the head and neck June 10, and August 25, 2023. MEDICATIONS: Heparin  2000 units IV; Ancef  2 g IV antibiotic was administered within 1 hour of the procedure. ANESTHESIA/SEDATION: General anesthesia. CONTRAST:  Omnipaque  300 approximately 60 mL. FLUOROSCOPY TIME:  Fluoroscopy Time: 6 minutes 36 seconds (380 mGy). COMPLICATIONS: None immediate. TECHNIQUE: Informed written consent was obtained from the patient after a thorough discussion of the procedural risks, benefits and alternatives. All questions were addressed. Maximal Sterile Barrier Technique was utilized including caps, mask, sterile gowns, sterile gloves, sterile drape, hand hygiene and skin antiseptic. A timeout was performed prior to the initiation of the procedure. The right  groin was prepped and draped in the usual sterile fashion. Thereafter using modified Seldinger technique, transfemoral access into the right common femoral artery was obtained without difficulty. Over an 0.035 inch guidewire, an 8 French 25 cm Pinnacle sheath was inserted. Through this, and also over an 0.035 inch guidewire, a 5 [place] JB 1 catheter was advanced to the aortic arch region and selectively positioned in the right common carotid artery, the innominate artery, the right common carotid artery, the left common carotid artery and the left vertebral artery. FINDINGS: The innominate arteriogram demonstrates right subclavian artery and the proximal right common carotid artery to be widely patent. The right vertebral artery origin is widely patent. The vessel opacifies to the cranial skull base. Patency is seen of the right posterior-inferior cerebellar artery and the right vertebrobasilar junction. The opacified portions  of the basilar artery, the posterior cerebral arteries and the superior cerebellar arteries on the lateral projection are grossly patent. The right common carotid arteriogram demonstrates the right external carotid artery and its major branches to be widely patent. Right internal carotid artery at the bulb demonstrates a smooth atherosclerotic plaque along the lateral posterior aspect of the carotid bulb without evidence of intraluminal filling defects. No acute ulcerations are seen. Distal to this, the right ICA opacifies to the cranial skull base. A focal area of mild narrowing interspersed with an area of small focal outpouchings without intrinsic stenosis is seen involving the mid cervical ICA. The petrous, the cavernous and the supraclinoid right ICA are widely patent. The right middle cerebral artery and the right anterior cerebral artery opacify into the capillary and venous phases. Suggestion of a dominant right anterior cerebral A2 segment is seen with both the pericallosal arteries  into the capillary and venous phases. The left common carotid arteriogram demonstrates the left external carotid artery to be mildly narrowed at its origin. Its branches opacify normally. The left internal carotid artery at the bulb to the cranial skull base is widely patent. 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 No evidence of intraluminal filling defects or intimal flaps is evident. At the level of the proximal cavernous and caval segment there is an ill-defined area of faint lucency which probably represents an atherosclerotic plaque along the medial aspect best seen on the magnified oblique images, and corresponds to plaque seen on the CT angiogram of the head and neck. The supraclinoid left ICA is widely patent. The left middle cerebral artery opacifies into the capillary and venous phases. The right anterior A1 segment is patent with flow noted into the hypoplastic right anterior cerebral A2 segment as described earlier. The left vertebral artery origin is widely patent. The vessel opacifies normally to the cranial skull base. Patency is seen of the left vertebrobasilar junction and the left posterior-inferior cerebellar artery. The opacified portion of the basilar artery, the posterior cerebral arteries, the superior cerebellar arteries and the anterior-inferior cerebellar arteries demonstrate wide patency into the capillary and venous phases. Unopacified blood is seen in the basilar artery from the contralateral vertebral artery. 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  Mildly decompressed left ICA extra cranially. PLAN: Findings reviewed with patient and spouse. Findings relayed to the patient's stroke neurologist also. Follow-up as per stroke neurologist. Electronically Signed   By: Luellen Sages M.D.   On: 08/28/2023 08:26   DG Chest Port 1 View Result Date: 08/26/2023 CLINICAL DATA:  Shortness of breath. EXAM: PORTABLE CHEST 1 VIEW COMPARISON:  07/31/2023 FINDINGS: The lungs are clear without focal pneumonia, edema, pneumothorax or pleural effusion. Cardiopericardial silhouette is at upper limits of normal for size. Left-sided permanent pacemaker/AICD again noted. Telemetry leads overlie the chest. No acute bony abnormality. IMPRESSION: No active disease. Electronically Signed   By: Donnal Fusi M.D.   On: 08/26/2023 06:43   CT ANGIO HEAD NECK W WO CM W PERF (CODE STROKE) Result Date: 08/25/2023 CLINICAL DATA:  Initial evaluation for acute neuro deficit, stroke suspected, right-sided weakness. Aphasia. EXAM: CT ANGIOGRAPHY HEAD AND NECK CT PERFUSION BRAIN TECHNIQUE: Multidetector  CT imaging of the head and neck was performed using the standard protocol during bolus administration of intravenous contrast. Multiplanar CT image reconstructions and MIPs were obtained to evaluate the vascular anatomy. Carotid stenosis measurements (when applicable) are obtained utilizing NASCET criteria, using the distal internal carotid diameter as the denominator. Multiphase CT imaging of the brain was performed following IV bolus contrast injection. Subsequent parametric perfusion maps were calculated using RAPID software. RADIATION DOSE REDUCTION: This exam was performed according to the departmental dose-optimization program which includes automated exposure control, adjustment of the mA and/or kV according to patient size and/or use of iterative reconstruction technique. CONTRAST:  OMNIPAQUE  IOHEXOL  350 MG/ML SOLN COMPARISON:  CT from earlier the same day as well as prior  studies from 08/21/2023. FINDINGS: CTA NECK FINDINGS Aortic arch: Visualized aortic arch within normal limits for caliber standard 3 vessel morphology. Aortic atherosclerosis. No significant stenosis about the origin the great vessels. Right carotid system: Right common and internal carotid arteries are patent without dissection. Moderate calcified plaque about the right carotid bulb without hemodynamically significant greater than 50% stenosis. Minimal irregularity about the mid cervical right ICA noted, favored atherosclerotic in nature. Left carotid system: Left common carotid artery remains patent to the bifurcation without stenosis. Atheromatous change about the left carotid bifurcation without hemodynamically significant stenosis. Previously seen left ICA occlusion is no longer seen, with interval revascularization of the left ICA in the neck. Left ICA remains patent the skull base without significant stenosis or evidence for dissection. Vertebral arteries: Both vertebral arteries arise from subclavian arteries. No proximal subclavian artery stenosis. Evaluation of vertebral artery somewhat limited by adjacent venous contamination. Tubal arteries M cells are patent without stenosis or dissection. Skeleton: No discrete or worrisome osseous lesions. Moderate cervical spondylosis. Other neck: No other acute finding. Upper chest: Streak artifact from a left-sided pacemaker/AICD. No other acute finding. Review of the MIP images confirms the above findings CTA HEAD FINDINGS Anterior circulation: Atheromatous change about the carotid siphons bilaterally. No significant stenosis on the right. The left ICA is now patent to the terminus. Focal filling defect within the cavernous left ICA at the level of the anterior genu consistent with small volume thrombus (series 5, image 133). This likely reflects a small amount of migrated clot from the neck below given prior left ICA occlusion. A1 segments patent bilaterally.  Normal anterior communicating artery complex. Azygous ACA widely patent without stenosis. No M1 stenosis or occlusion. Right MCA branches patent and well perfused. On the left, there is a distal left M3 occlusion, likely embolic (a series 5, image 99). Remainder of the left MCA branches remain patent. Posterior circulation: Both V4 segments patent without stenosis. Both PICA patent at their origins. Basilar patent without stenosis. Superior cerebral arteries patent bilaterally. Atheromatous irregularity about the PCAs with associated mild to moderate bilateral P2 stenoses. PCAs remain patent to their distal aspects. Venous sinuses: Grossly patent allowing for timing the contrast bolus. Anatomic variants: Azygous ACA.  No aneurysm. Review of the MIP images confirms the above findings CT Brain Perfusion Findings: ASPECTS: 10 CBF (<30%) Volume: 0mL Perfusion (Tmax>6.0s) volume: 20mL Mismatch Volume: 20mL Infarction Location:Negative CT perfusion for acute core infarct. 20 mL area of delayed perfusion at the mid-posterior left frontal lobe, in keeping with the distal left M3 occlusion. No other perfusion abnormality. IMPRESSION: 1. Interval revascularization of the left ICA in the neck, with the left ICA now patent to the terminus. Focal filling defect within the cavernous left ICA at the  level of the anterior genu consistent with small volume thrombus, likely migrated clot from the neck below given prior left ICA occlusion. Secondary severe stenosis at this level. 2. Downstream acute distal left M3 occlusion, likely embolic. 3. Negative CT perfusion for acute core infarct. 20 mL area of delayed perfusion at the mid-posterior left frontal lobe, in keeping with the left M3 occlusion. 4. Atheromatous change about the carotid bifurcations and carotid siphons without hemodynamically significant stenosis. 5.  Aortic Atherosclerosis (ICD10-I70.0). Results were discussed by telephone at the time of interpretation on 08/25/2023  at 9:10 p.m. to provider [facility] , who verbally acknowledged these results. Electronically Signed   By: Virgia Griffins M.D.   On: 08/25/2023 21:55   CT HEAD CODE STROKE WO CONTRAST Result Date: 08/25/2023 CLINICAL DATA:  Code stroke. Initial evaluation for acute neuro deficit, stroke suspected. EXAM: CT HEAD WITHOUT CONTRAST TECHNIQUE: Contiguous axial images were obtained from the base of the skull through the vertex without intravenous contrast. RADIATION DOSE REDUCTION: This exam was performed according to the departmental dose-optimization program which includes automated exposure control, adjustment of the mA and/or kV according to patient size and/or use of iterative reconstruction technique. COMPARISON:  Prior studies from 08/21/2023 FINDINGS: Brain: Mild age-related cerebral atrophy with chronic small vessel ischemic disease. Small remote right cerebellar infarct. No acute intracranial hemorrhage. No acute large vessel territory infarct. No mass lesion or midline shift. No hydrocephalus or extra-axial fluid collection. Vascular: No abnormal hyperdense vessel. Calcified atherosclerosis present at the skull base. Skull: Scalp soft tissues within normal limits.  Calvarium intact. Sinuses/Orbits: Globes orbital soft tissues within normal limits. Paranasal sinuses are clear. No mastoid effusion. Other: None. ASPECTS [facility] Stroke Program Early CT Score) - Ganglionic level infarction (caudate, lentiform nuclei, internal capsule, insula, M1-M3 cortex): 7 - Supraganglionic infarction (M4-M6 cortex): 3 Total score (0-10 with 10 being normal): 10 IMPRESSION: 1. No acute intracranial abnormality. 2. ASPECTS is 10. 3. 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 These results were communicated to Dr. Arora at 8:52 pm on 08/25/2023 by text page via the [facility] messaging system. Electronically Signed   By: Virgia Griffins M.D.   On: 08/25/2023 20:55    MR BRAIN WO CONTRAST Result Date: 08/23/2023 CLINICAL DATA:  Provided history: Vision changes. Headache. Dizziness. EXAM: MRI HEAD WITHOUT CONTRAST TECHNIQUE: Multiplanar, multiecho pulse sequences of the brain and surrounding structures were obtained without intravenous contrast. COMPARISON:  Non-contrast head CT, CT angiogram head/neck and CT perfusion head 08/21/2023. FINDINGS: Brain: Mild generalized cerebral atrophy. 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 Multifocal T2 FLAIR hyperintense signal abnormality within the cerebral white matter, nonspecific but compatible with moderate chronic small vessel ischemic disease. Mild chronic small vessel ischemic changes within the pons. Small T2 hyperintense focus within the right caudate head likely reflecting a prominent perivascular space. Prominent perivascular space also present within the right basal ganglia inferiorly. 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 Nonspecific punctate chronic microhemorrhage within medial the right cerebellar hemisphere. No evidence of an intracranial mass. No extra-axial fluid collection. No midline shift. 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 Skull and upper cervical spine: No focal worrisome marrow lesion. Sinuses/Orbits: No mass or acute finding within the imaged orbits. Prior but ocular lens replacement. No significant paranasal sinus disease. IMPRESSION: 1. 2 mm acute  cortical infarct within the left occipital lobe (PCA vascular territory). 2. Known occlusion of the left internal carotid artery. 3. Chronic small vessel ischemic changes which are moderate in the cerebral white matter and mild in the pons. 4. Small chronic infarct within the right cerebellar hemisphere. 5. Mild generalized cerebral atrophy. Electronically Signed   By:  Bascom Lily D.O.   On: 08/23/2023 13:22   CUP PACEART REMOTE DEVICE CHECK Result Date: 08/22/2023 ICD Scheduled remote reviewed. Normal device function.  Presenting rhythm: BIV paced HF diagnostics abnormal this monitoring period. BIV pacing at 91%; stable. No new events since 4/4 programmer session, prior to that 6 NSVT events, longest x 4 sec; V rates >200 bpm.  Likely ventricular driven. Next remote 91 days. [place], [facility]  ECHOCARDIOGRAM COMPLETE Result Date: 08/22/2023    ECHOCARDIOGRAM REPORT   Patient Name:   Brent Ray. Date of Exam: 08/22/2023 Medical Rec #:  [id number]            Height:       68.0 in Accession #:    [id number]           Weight:       123.4 lb Date of Birth:  11/21/1945            BSA:          1.665 m Patient Age:    77 years             BP:           142/87 mmHg Patient Gender: M                    HR:           83 bpm. Exam Location:  [facility] Procedure: 2D Echo, Cardiac Doppler, Color Doppler and Strain Analysis (Both            Spectral and Color Flow Doppler were utilized during procedure). Indications:    stroke I63.9  History:        Patient has prior history of Echocardiogram examinations, most                 recent 01/29/2023. CHF, CAD; Risk Factors:Hypertension and                 Dyslipidemia.  Sonographer:    Kip Peon RDCS Referring Phys: [id number] Arnie Lao [facility]  Sonographer Comments: Global longitudinal strain was attempted. IMPRESSIONS  1. Left ventricular ejection fraction, by estimation, is 35 to 40%. The left ventricle has moderately decreased function. The left ventricle demonstrates regional wall motion abnormalities (see scoring diagram/findings for description). There is mild concentric left ventricular hypertrophy. Left ventricular diastolic parameters are indeterminate.  2. Right ventricular systolic function is normal. The right ventricular size is normal. There is mildly elevated pulmonary artery systolic pressure. The estimated right  ventricular systolic pressure is 38.0 mmHg.  3. Left atrial size was moderately dilated.  4. The mitral valve is degenerative with mild posterior leaflet prolapse. Moderate to severe mitral valve regurgitation.  5. Tricuspid valve regurgitation is mild to moderate.  6. The aortic valve is tricuspid. There is mild calcification of the aortic valve. Aortic valve regurgitation is trivial. No aortic stenosis is present.  7. The inferior vena cava is normal in size with greater than 50% respiratory variability, suggesting right atrial pressure of 3 mmHg.  8. Evidence of probable atrial level shunting detected by color flow Doppler, left to right at  mid interatrial septum. Suggest agitated saline study. Comparison(s): Prior images reviewed side by side. LVEF 35-40% with stable WMA and septal motion consistent with RV pacing. Mild posterior leaflet mitral prolapse with moderate to severe mtiral regurgitation. 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 FINDINGS  Left Ventricle: Left ventricular ejection fraction, by estimation, is 35 to 40%. The left ventricle has moderately decreased function. The left ventricle demonstrates regional wall motion abnormalities. The left ventricular internal cavity size was normal in size. There is mild concentric left ventricular hypertrophy. Abnormal (paradoxical) septal motion, consistent with RV pacemaker. Left ventricular diastolic parameters are indeterminate.  LV Wall Scoring: The entire inferior wall, posterior wall, and basal anterolateral segment are hypokinetic. The entire anterior wall, entire septum, apical lateral segment, mid anterolateral segment, and apex are normal. Right Ventricle: The right ventricular size is normal. No increase in right ventricular wall thickness. Right ventricular systolic function is normal. There is mildly elevated pulmonary artery systolic pressure. The tricuspid regurgitant velocity is 2.96  m/s, and with an assumed right  atrial pressure of 3 mmHg, the estimated right ventricular systolic pressure is 38.0 mmHg. Left Atrium: Left atrial size was moderately dilated. Right Atrium: Right atrial size was normal in size. Pericardium: There is no evidence of pericardial effusion. Mitral Valve: The mitral valve is degenerative in appearance. There is mild thickening of the mitral valve leaflet(s). Moderate to severe mitral valve regurgitation, with posteriorly-directed jet. Tricuspid Valve: The tricuspid valve is grossly normal. Tricuspid valve regurgitation is mild to moderate. Aortic Valve: The aortic valve is tricuspid. There is mild calcification of the aortic valve. There is mild aortic valve annular calcification. Aortic valve regurgitation is trivial. No aortic stenosis is present. Pulmonic Valve: The pulmonic valve was grossly normal. Pulmonic valve regurgitation is trivial. Aorta: The aortic root and ascending aorta are structurally normal, with no evidence of dilitation. Venous: The inferior vena cava is normal in size with greater than 50% respiratory variability, suggesting right atrial pressure of 3 mmHg. IAS/Shunts: Evidence of atrial level shunting detected by color flow Doppler. Additional Comments: 3D was performed not requiring image post processing on an independent workstation and was indeterminate. A device lead is visualized.  LEFT VENTRICLE PLAX 2D LVIDd:         5.10 cm LVIDs:         4.40 cm LV PW:         1.40 cm 3D Volume EF LV IVS:        1.00 cm LV 3D EDV:   123.82 ml                        LV 3D ESV:   48.23 ml RIGHT VENTRICLE          IVC RV Basal diam:  3.70 cm  IVC diam: 1.60 cm LEFT ATRIUM             Index        RIGHT ATRIUM           Index LA diam:        4.10 cm 2.46 cm/m   RA Area:     19.90 cm LA Vol (A2C):   58.0 ml 34.84 ml/m  RA Volume:   50.80 ml  30.52 ml/m LA Vol (A4C):   84.5 ml 50.76 ml/m LA Biplane Vol: 73.4 ml 44.10 ml/m  AORTIC VALVE LVOT Vmax:   75.95 cm/s LVOT Vmean:  46.700 cm/s  LVOT VTI:    0.125 m  AORTA Ao Asc diam: 3.20 cm MITRAL VALVE               TRICUSPID VALVE MV Area (PHT): 3.12 cm    TR Peak grad:   35.0 mmHg MV Decel Time: 243 msec    TR Vmax:        296.00 cm/s MR Peak grad: 87.0 mmHg MR Mean grad: 62.0 mmHg    SHUNTS MR Vmax:      466.33 cm/s  Systemic VTI: 0.12 m MR Vmean:     367.0 cm/s MV E velocity: 70.60 cm/s MV A velocity: 30.70 cm/s MV E/A ratio:  2.30 Brent Favre MD Electronically signed by Brent Favre MD Signature Date/Time: 08/22/2023/11:28:01 AM    Final    CT CEREBRAL PERFUSION W CONTRAST Result Date: 08/21/2023 CLINICAL DATA:  Initial evaluation for acute visual disturbance. EXAM: CT PERFUSION BRAIN TECHNIQUE: Multiphase CT imaging of the brain was performed following IV bolus contrast injection. Subsequent parametric perfusion maps were calculated using RAPID software. RADIATION DOSE REDUCTION: This exam was performed according to the departmental dose-optimization program which includes automated exposure control, adjustment of the mA and/or kV according to patient size and/or use of iterative reconstruction technique. CONTRAST:  32mL OMNIPAQUE  IOHEXOL  350 MG/ML SOLN COMPARISON:  Prior CTs from earlier the same day. FINDINGS: CT Brain Perfusion Findings: CBF (<30%) Volume: 0mL Perfusion (Tmax>6.0s) volume: 0mL Mismatch Volume: 0mL ASPECTS on noncontrast CT Head: 10 at 5:43 p.m. today. Infarct Core: 0 mL Infarction Location:Negative CT perfusion for acute ischemia. Mildly delayed perfusion of T-max greater than 4 seconds in the watershed zones of the left cerebral hemispheres compared to the right, presumably related to the occluded left ICA as seen on prior CTA. IMPRESSION: 1. Negative CT perfusion for acute ischemia. 2. Mildly delayed perfusion of T-max greater than 4 seconds in the watershed zones of the left cerebral hemisphere compared to the right, presumably related to the occluded left ICA as seen on prior CTA. Electronically Signed   By:  Virgia Griffins M.D.   On: 08/21/2023 21:04   CT ANGIO HEAD NECK W WO CM Addendum Date: 08/21/2023 ADDENDUM REPORT: 08/21/2023 19:32 ADDENDUM: CTA neck impression #1 and CTA head impression #1 called by telephone on 08/21/2023 at 7:14 pm to provider Dr. Alecia Ames, who verbally acknowledged these results. Electronically Signed   By: Bascom Lily D.O.   On: 08/21/2023 19:32   Result Date: 08/21/2023 CLINICAL DATA:  Provided history: Neuro deficit, acute, stroke suspected. Left-sided headache, vision changes, dizziness. EXAM: CT ANGIOGRAPHY HEAD AND NECK WITH AND WITHOUT CONTRAST TECHNIQUE: Multidetector CT imaging of the head and neck was performed using the standard protocol during bolus administration of intravenous contrast. Multiplanar CT image reconstructions and MIPs were obtained to evaluate the vascular anatomy. Carotid stenosis measurements (when applicable) are obtained utilizing NASCET criteria, using the distal internal carotid diameter as the denominator. RADIATION DOSE REDUCTION: This exam was performed according to the departmental dose-optimization program which includes automated exposure control, adjustment of the mA and/or kV according to patient size and/or use of iterative reconstruction technique. CONTRAST:  60mL OMNIPAQUE  IOHEXOL  350 MG/ML SOLN COMPARISON:  Non-contrast head CT performed earlier today 08/21/2023. FINDINGS: CTA NECK FINDINGS Aortic arch: Standard aortic branching. Atherosclerotic plaque within the visualized aortic arch and proximal major branch vessels of the neck. No hemodynamically significant innominate or proximal subclavian artery stenosis. Right carotid system: CCA and ICA patent within the neck. Atherosclerotic plaque about the carotid  bifurcation, within the proximal ICA and within the proximal ECA. Estimated 50% stenosis at the ICA origin. Severe stenosis of the proximal ECA. Left carotid system: The common carotid artery is patent within the neck. The  internal carotid artery becomes occluded shortly beyond its origin and remains occluded throughout the remainder of the neck. Calcified atherosclerotic plaque at the ECA origin resulting in at least moderate stenosis. Vertebral arteries: Codominant and patent within the neck. Scattered atherosclerotic plaque within the cervical left vertebral artery. Most notably, calcified plaque within the proximal V3 segment results in up to moderate stenosis. Skeleton: Dextrocurvature of the cervical spine. Cervical spondylosis. No acute fracture or aggressive osseous lesion. Other neck: No neck mass or cervical lymphadenopathy. Upper chest: No consolidation within the imaged lung apices. Review of the MIP images confirms the above findings CTA HEAD FINDINGS Anterior circulation: The intracranial left internal carotid artery remains occluded to the paraclinoid level. Reconstitution of enhancement within the supraclinoid left ICA. The intracranial right internal carotid artery is patent. Calcified atherosclerotic plaque within this vessel with no more than mild stenosis. The M1 middle cerebral arteries are patent. Atherosclerotic irregularity of the M2 and more distal MCA branches. No M2 proximal branch occlusion or high-grade proximal stenosis. The anterior cerebral arteries are patent. Azygous configuration of the anterior cerebral arteries. Mildly hypoplastic left A1 segment. No intracranial aneurysm is identified. Posterior circulation: The intracranial vertebral arteries are patent. The basilar artery is patent. The posterior cerebral arteries are patent. Atherosclerotic irregularity of both vessels. Most notably, there is a moderate/severe stenosis within the left PCA P2 segment. A left posterior communicating artery is present. The right posterior communicating artery is diminutive or absent. Venous sinuses: Within the limitations of contrast timing, no convincing thrombus. Anatomic variants: As described grade Review of  the MIP images confirms the above findings Attempts are being made to reach the ordering provider at this time. IMPRESSION: CTA neck: 1. 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 This is age-indeterminate. 2. The right common and internal carotid arteries are patent within the neck. Estimated 50% atherosclerotic narrowing at the right ICA origin. 3. Vertebral arteries patent within the neck. Up to moderate atherosclerotic narrowing of the left vertebral artery V3 segment. 4. Aortic Atherosclerosis (ICD10-I70.0). CTA head: 1. 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 This is age-indeterminate. Reconstitution of enhancement within the supraclinoid left ICA. 2. No proximal intracranial large vessel occlusion identified elsewhere. 3. Background intracranial atherosclerotic disease as described. Most notably, there is a moderate/severe stenosis within the left PCA P2 segment. Electronically Signed: By: Bascom Lily D.O. On: 08/21/2023 19:04   CT HEAD CODE STROKE WO CONTRAST Result Date: 08/21/2023 CLINICAL DATA:  Code stroke. Neuro deficit, acute, stroke suspected. Dizziness. Eye pain on the left, headache, nausea. EXAM: CT HEAD WITHOUT CONTRAST TECHNIQUE: Contiguous axial images were obtained from the base of the skull through the vertex without intravenous contrast. RADIATION DOSE REDUCTION: This exam was performed according to the departmental dose-optimization program which includes automated exposure control, adjustment of the mA and/or kV according to patient size and/or use of iterative reconstruction technique. COMPARISON:  Head CT 08/08/2023. FINDINGS: Brain: Generalized cerebral atrophy. Prominent perivascular spaces within the right basal ganglia inferiorly. Small chronic infarct again demonstrated within the inferior right cerebellar hemisphere. There is no acute  intracranial hemorrhage. No demarcated cortical infarct. No extra-axial fluid collection. No evidence of an intracranial mass.  No midline shift. Vascular: No hyperdense vessel. Atherosclerotic calcifications. Skull: No calvarial fracture or aggressive osseous lesion. Sinuses/Orbits: No mass or acute finding within the imaged orbits. No significant paranasal sinus disease at the imaged levels. ASPECTS [facility] Stroke Program Early CT Score) - Ganglionic level infarction (caudate, lentiform nuclei, internal capsule, insula, M1-M3 cortex): 7 - Supraganglionic infarction (M4-M6 cortex): 3 Total score (0-10 with 10 being normal): 10 No evidence of an acute intracranial abnormality. These results were communicated to Dr. Alecia Ames at 5:55 pmon 6/10/2025by text page via the [facility] messaging system. IMPRESSION: 1.  No evidence of an acute intracranial abnormality. 2. 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 3. Cerebral atrophy. Electronically Signed   By: Bascom Lily D.O.   On: 08/21/2023 17:56   CT Cervical Spine Wo Contrast Result Date: 08/08/2023 CLINICAL DATA:  Neck trauma (Age >= 65y) EXAM: CT CERVICAL SPINE WITHOUT CONTRAST TECHNIQUE: Multidetector CT imaging of the cervical spine was performed without intravenous contrast. Multiplanar CT image reconstructions were also generated. RADIATION DOSE REDUCTION: This exam was performed according to the departmental dose-optimization program which includes automated exposure control, adjustment of the mA and/or kV according to patient size and/or use of iterative reconstruction technique. COMPARISON:  None Available. FINDINGS: Alignment: Straightening of normal lordosis. No traumatic subluxation. Skull base and vertebrae: No acute fracture. Vertebral body heights are maintained. Schmorl's node within inferior C7. The dens and skull base are intact. Soft tissues and spinal canal: No prevertebral fluid or swelling. No visible canal  hematoma. Disc levels: Disc space narrowing and anterior spurring C4-C5, C5-C6, C6-C7 and C7-T1. Upper chest: No acute findings. Biapical pleuroparenchymal scarring. Other: Carotid calcifications. IMPRESSION: 1. No acute fracture or subluxation of the cervical spine. 2. Multilevel degenerative disc disease. Electronically Signed   By: Chadwick Colonel M.D.   On: 08/08/2023 17:33   DG Elbow 2 Views Left Result Date: 08/08/2023 CLINICAL DATA:  Pain after fall. EXAM: LEFT ELBOW - 2 VIEW COMPARISON:  None Available. FINDINGS: There is no evidence of fracture or dislocation. Right elevated anterior fat pad but no posterior fat pad elevation. There is no evidence of arthropathy or other focal bone abnormality. Soft tissues are unremarkable. IMPRESSION: 1. Possible small joint effusion. 2. No evidence of fracture or dislocation. If symptoms persist, recommend follow-up radiographs in 7-10 days. Electronically Signed   By: Chadwick Colonel M.D.   On: 08/08/2023 17:26   CT Head Wo Contrast Result Date: 08/08/2023 CLINICAL DATA:  Head trauma, minor (Age >= 65y) EXAM: CT HEAD WITHOUT CONTRAST TECHNIQUE: Contiguous axial images were obtained from the base of the skull through the vertex without intravenous contrast. RADIATION DOSE REDUCTION: This exam was performed according to the departmental dose-optimization program which includes automated exposure control, adjustment of the mA and/or kV according to patient size and/or use of iterative reconstruction technique. COMPARISON:  None Available. FINDINGS: Brain: No intracranial hemorrhage, mass effect, or midline shift. Generalized atrophy. No hydrocephalus. The basilar cisterns are patent. Perivascular space versus remote lacunar infarct in the right basal ganglia. Remote right cerebellar lacunar infarct. No evidence of territorial infarct or acute ischemia. No extra-axial or intracranial fluid collection. Vascular: Atherosclerosis of skullbase vasculature without  hyperdense vessel or abnormal calcification. Skull: No fracture or focal lesion. Sinuses/Orbits: Paranasal sinuses and mastoid air cells are clear. The visualized orbits are unremarkable. Other: No confluent scalp hematoma. IMPRESSION: 1. No acute intracranial abnormality. No skull fracture. 2. Generalized atrophy. Remote right cerebellar lacunar infarct. Electronically Signed  By: Chadwick Colonel M.D.   On: 08/08/2023 17:24   DG Chest 2 View Result Date: 07/31/2023 CLINICAL DATA:  Shortness of breath. EXAM: CHEST - 2 VIEW COMPARISON:  02/02/2016. FINDINGS: Bilateral lung fields are clear. Bilateral costophrenic angles are clear. Normal cardio-mediastinal silhouette. There is a left sided 3-lead pacemaker. No acute osseous abnormalities. The soft tissues are within normal limits. IMPRESSION: *No active cardiopulmonary disease. Electronically Signed   By: Brent Ray M.D.   On: 07/31/2023 14:19    Subjective: No complaints  Discharge Exam: Vitals:   08/29/23 0400 08/29/23 0800  BP: 132/71   Pulse: 70   Resp: 12   Temp: 98.3 F (36.8 C) 97.7 F (36.5 C)  SpO2: 99%    Vitals:   08/29/23 0000 08/29/23 0400 08/29/23 0500 08/29/23 0800  BP: (!) 103/49 132/71    Pulse: 69 70    Resp: 13 12    Temp: 98.3 F (36.8 C) 98.3 F (36.8 C)  97.7 F (36.5 C)  TempSrc: Oral Oral  Axillary  SpO2: 97% 99%    Weight:   54.6 kg   Height:        General: Pt is alert, awake, not in acute distress Cardiovascular: RRR, S1/S2 +, no rubs, no gallops Respiratory: CTA bilaterally, no wheezing, no rhonchi Abdominal: Soft, NT, ND, bowel sounds + Extremities: no edema, no cyanosis    The results of significant diagnostics from this hospitalization (including imaging, microbiology, ancillary and laboratory) are listed below for reference.     Microbiology: No results found for this or any previous visit (from the past 240 hours).   Labs: BNP (last 3 results) Recent Labs    07/31/23 1440   BNP 1,699.0*   Basic Metabolic Panel: Recent Labs  Lab 08/23/23 0736 08/25/23 0644 08/26/23 1134 08/27/23 0534 08/29/23 0519  NA 139 138 135 138 138  K 3.7 3.3* 3.7 3.7 4.4  CL 111 107 106 111 112*  CO2 17* 19* 17* 19* 19*  GLUCOSE 88 96 91 97 96  BUN 21 23 24* 25* 29*  CREATININE 1.83* 2.24* 2.00* 2.14* 2.35*  CALCIUM  8.6* 9.0 8.8* 8.7* 8.7*  MG 1.9 1.9 1.8 2.0  --   PHOS  --  3.3 2.6 3.0  --    Liver Function Tests: No results for input(s): AST, ALT, ALKPHOS, BILITOT, PROT, ALBUMIN  in the last 168 hours. No results for input(s): LIPASE, AMYLASE in the last 168 hours. No results for input(s): AMMONIA in the last 168 hours. CBC: Recent Labs  Lab 08/24/23 0647 08/25/23 1608 08/26/23 1134 08/27/23 0534 08/29/23 0519  WBC 4.8 8.5 8.1 6.6 6.5  HGB 12.5* 13.8 12.2* 12.1* 10.5*  HCT 37.5* 40.7 36.6* 35.0* 31.4*  MCV 98.4 98.8 100.0 99.4 101.6*  PLT 222 197 245 237 243   Cardiac Enzymes: No results for input(s): CKTOTAL, CKMB, CKMBINDEX, TROPONINI in the last 168 hours. BNP: Invalid input(s): POCBNP CBG: Recent Labs  Lab 08/25/23 2019  GLUCAP 82   D-Dimer No results for input(s): DDIMER in the last 72 hours. Hgb A1c No results for input(s): HGBA1C in the last 72 hours. Lipid Profile No results for input(s): CHOL, HDL, LDLCALC, TRIG, CHOLHDL, LDLDIRECT in the last 72 hours. Thyroid  function studies No results for input(s): TSH, T4TOTAL, T3FREE, THYROIDAB in the last 72 hours.  Invalid input(s): FREET3 Anemia work up No results for input(s): VITAMINB12, FOLATE, FERRITIN, TIBC, IRON, RETICCTPCT in the last 72 hours. Urinalysis No results found for: COLORURINE, APPEARANCEUR,  LABSPEC, PHURINE, GLUCOSEU, HGBUR, BILIRUBINUR, KETONESUR, PROTEINUR, UROBILINOGEN, NITRITE, LEUKOCYTESUR Sepsis Labs Recent Labs  Lab 08/25/23 1608 08/26/23 1134 08/27/23 0534 08/29/23 0519  WBC  8.5 8.1 6.6 6.5   Microbiology No results found for this or any previous visit (from the past 240 hours).   Time coordinating discharge: Over 35 minutes  SIGNED:   Macdonald Savoy, MD  [facility] 08/29/2023, 9:41 AM Pager   If 7PM-7AM, please contact night-coverage [email or web address] Password [email or web address]

## 2023-09-03 ENCOUNTER — Emergency Department (HOSPITAL_COMMUNITY)
Admission: EM | Admit: 2023-09-03 | Discharge: 2023-09-03 | Disposition: A | Discharge status: Home / Self Care | Attending: Emergency Medicine | Admitting: Emergency Medicine

## 2023-09-03 ENCOUNTER — Encounter (HOSPITAL_COMMUNITY): Payer: Self-pay

## 2023-09-03 ENCOUNTER — Other Ambulatory Visit: Payer: Self-pay

## 2023-09-03 DIAGNOSIS — Z5189 Encounter for other specified aftercare: Secondary | ICD-10-CM

## 2023-09-03 DIAGNOSIS — Z4800 Encounter for change or removal of nonsurgical wound dressing: Secondary | ICD-10-CM | POA: Insufficient documentation

## 2023-09-03 DIAGNOSIS — Z7982 Long term (current) use of aspirin: Secondary | ICD-10-CM | POA: Diagnosis not present

## 2023-09-03 LAB — POCT ACTIVATED CLOTTING TIME: Activated Clotting Time: 153 s

## 2023-09-03 NOTE — Discharge Instructions (Signed)
 Please follow back up with your primary care provider/wound care provider for further concerns.  Continue wound care regimen as previously discussed.

## 2023-09-03 NOTE — ED Triage Notes (Signed)
 Pt bumped his left forearm on a door 4 days ago and now has a wound there.pt recently started on a new blood thinner.

## 2023-09-03 NOTE — ED Provider Notes (Signed)
  EMERGENCY DEPARTMENT AT [facility] Provider Note   CSN: [id number] Arrival date & time: 09/03/23  1216     Patient presents with: Wound Check   Brent Ray. is a 78 y.o. male who presents to the ED today for concerns of swelling to the left forearm where he has a previously healed wound to the same.  Was sent here by his primary care for concerns of wound edema.    Wound Check       Prior to Admission medications   Medication Sig Start Date End Date Taking? Authorizing Provider  apixaban  (ELIQUIS ) 2.5 MG TABS tablet Take 1 tablet (2.5 mg total) by mouth 2 (two) times daily. 08/29/23   Odell Celinda Balo, MD  aspirin  81 MG chewable tablet Chew 1 tablet (81 mg total) by mouth daily. 08/29/23   Odell Celinda Balo, MD  CRANBERRY PO Take 500 mg by mouth daily.    [provider]  folic acid  (FOLVITE ) 1 MG tablet Take 1 mg by mouth daily.  NOT on Sunday d/t methotrexate  12/13/22   [provider]  furosemide  (LASIX ) 20 MG tablet Take 1 tablet (20 mg total) by mouth daily. Patient taking differently: Take 20 mg by mouth every other day. 08/21/23   Sabharwal, Aditya, DO  methotrexate  (RHEUMATREX) 2.5 MG tablet Take 2.5 mg by mouth every Friday. 6 pills weekly    [provider]  mexiletine (MEXITIL ) 150 MG capsule Take 150 mg by mouth 2 (two) times daily.    [provider]  NITROSTAT  0.4 MG SL tablet DISSOLVE 1 TABLET UNDER TONGUE EVERY 5 MINUTES AS NEEDED FOR CHEST PAIN Patient taking differently: Place 0.4 mg under the tongue every 5 (five) minutes as needed for chest pain. 05/11/14   Debera Jayson MATSU, MD  ondansetron  (ZOFRAN ) 4 MG tablet Take 4 mg by mouth every 8 (eight) hours as needed for nausea. 01/15/23   [provider]  pantoprazole  (PROTONIX ) 40 MG tablet Take 40 mg by mouth every evening.    [provider]  potassium chloride  SA (KLOR-CON  M) 20 MEQ tablet Take 20 mEq by mouth daily.     [provider]  Probiotic Product (PROBIOTIC DAILY PO) Take 110 mg by mouth daily.    [provider]  rosuvastatin  (CRESTOR ) 5 MG tablet Take 5 mg by mouth every Sunday.    [provider]    Allergies: Amiodarone, Flecainide , Carvedilol , Dapagliflozin, Eplerenone, Flomax [tamsulosin hcl], Lisinopril , Metoprolol , Sacubitril-valsartan, Spironolactone , Statins, and Sulfasalazine    Review of Systems  Skin:  Positive for wound.  All other systems reviewed and are negative.   Updated Vital Signs BP 129/76 (BP Location: Right Arm)   Pulse 62   Temp 97.8 F (36.6 C) (Oral)   Resp 16   SpO2 100%   Physical Exam Vitals and nursing note reviewed.  Constitutional:      General: He is not in acute distress.    Appearance: He is well-developed.  HENT:     Head: Normocephalic and atraumatic.   Eyes:     Conjunctiva/sclera: Conjunctivae normal.    Cardiovascular:     Rate and Rhythm: Normal rate and regular rhythm.     Heart sounds: No murmur heard. Pulmonary:     Effort: Pulmonary effort is normal. No respiratory distress.     Breath sounds: Normal breath sounds.  Abdominal:     Palpations: Abdomen is soft.     Tenderness: There is no abdominal  tenderness.   Musculoskeletal:        General: No swelling.     Cervical back: Neck supple.   Skin:    General: Skin is warm and dry.     Capillary Refill: Capillary refill takes less than 2 seconds.     Findings: Laceration present.     Comments: Skin tear noted to the posterior left forearm.  No periwound erythema or edema noted.  No purulent exudate   Neurological:     Mental Status: He is alert.   Psychiatric:        Mood and Affect: Mood normal.     (all labs ordered are listed, but only abnormal results are displayed) Labs Reviewed - No data to display  EKG: None  Radiology: No results found.   Procedures   Medications Ordered in the ED - No data to display                                   Medical Decision Making  Medical Decision Making:   Brent Ray. is a 78 y.o. male who presented to the ED today with concerns for laceration to the left forearm detailed above.     Complete initial physical exam performed, notably the patient  was alert and oriented no apparent distress.  Noted skin tears to the dorsal left forearm.  Wound healing is normal for 3 days postinjury.  Normal granular tissue noted.  Mild bleeding noted to the proximal extent of the wound, no purulent exudate noted..    Reviewed and confirmed nursing documentation for past medical history, family history, social history.    Initial Assessment:   With the patient's presentation of skin tear to the left forearm, most likely diagnosis is skin tear.   Initial Plan:  Based on assessment no further workup indicated at this time.  Reassessment and Plan:   Wound covered with a nonadherent dressing and wrapped with bulky dressing.  As there is no signs of wound infection, no further workup or intervention notable at this time.  Referred back to primary care/wound care for further assistance with wound healing.       Final diagnoses:  Visit for wound check    ED Discharge Orders     None          Myriam Dorn BROCKS, [place] 09/03/23 1525    Patsey Lot, MD 09/03/23 1556

## 2023-09-03 NOTE — ED Notes (Signed)
 RN wrapped pt wound on left lower arm. Pt was provided extra supplies.

## 2023-09-05 ENCOUNTER — Telehealth (HOSPITAL_COMMUNITY): Payer: Self-pay

## 2023-09-05 NOTE — Telephone Encounter (Signed)
 Per Dr. Gardenia- due to patients recent admission/stroke cancel upcoming heart catheterization.    Called and spoke with patient and patients wife and made them aware. Advised them to keep upcoming follow up appointment with Dr. Gardenia as scheduled. They are aware and verbalized understanding.   Procedure cancelled with the cath lab.   Advised patient to call back to office with any issues, questions, or concerns. Patient verbalized understanding.

## 2023-09-07 ENCOUNTER — Encounter (HOSPITAL_COMMUNITY): Payer: Self-pay

## 2023-09-07 ENCOUNTER — Ambulatory Visit (HOSPITAL_COMMUNITY): Admission: RE | Admit: 2023-09-07 | Source: Ambulatory Visit

## 2023-09-11 ENCOUNTER — Encounter (HOSPITAL_COMMUNITY): Admission: RE | Payer: Self-pay | Source: Ambulatory Visit

## 2023-09-11 ENCOUNTER — Ambulatory Visit (HOSPITAL_COMMUNITY): Admission: RE | Admit: 2023-09-11 | Source: Ambulatory Visit | Admitting: Cardiology

## 2023-09-11 SURGERY — RIGHT/LEFT HEART CATH AND CORONARY ANGIOGRAPHY
Anesthesia: LOCAL

## 2023-09-20 NOTE — Progress Notes (Signed)
   AD[facility]ANCED HE[facility] FAILURE CLINIC NOTE  Referring Physician: Meade Bigness, MD  Primary Care: Meade Bigness, MD Primary Cardiologist:  CC: Chronic systolic heart failure  HPI: Brent Ray. is a 78 y.o. 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   Unfortunately he reports intolerance to almost every class of guideline directed medical therapy available. During our last visit he agreed to start trying losartan  due to a rapid decline in functional status. Unfortunately on the way home from clinic he had a stroke leading to a week long hospitalization (left ICA occlusion). Overall he has regained motor function but is much more weak and deconditioned now. From a HFrEF standpoint, reports fairly stable symptoms. Minimal dyspnea, LE edema, orthopnea or lightheadedness.   PHYSICAL EXAM: Vitals:   09/21/23 1144  BP: 130/80  Pulse: 66  SpO2: 100%   Lungs- normal work of breathing CARDIAC: Normal rate with regular rhythm. no murmur.  Pulses 2+. no edema.  ABDOMEN: Soft, non-tender, non-distended.  EXTREMITIES: Warm and well perfused.  NEUROLOGIC: No obvious FND   DATA REVIEW  ECG: 08/21/23: atrial fibrillation with BiV pacing.  as per my personal interpretation  ECHO: 01/29/23: LVEF 30-35%, normal RV function, mod MR.  As per my personal interpretation 08/22/23: LVEF 35-40%  ASSESSMENT & PLAN:  Heart failure with reduced ejection fraction - Etiology: Nonischemic cardiomyopathy - NYHA Class: NYHA III - Volume status: lasix  20mg  daily.  - ACEi/ARB/ARNI: losartan  12.5mg  daily.  - Aldosterone antagonist: Intolerant - Beta-blocker: Intolerant -  Digoxin: Intolerant - Hydralazine /Nitrates: Intolerant - SGLT2i: Intolerant - Advanced therapies: not a candidate.  - ICD: CRT-D in place. BiV pacing at 85% in 02/2023. We will refer him to EP. Unable to get device report today due to wrong birth date in the [employer] system. Following with Dr. Nancey on 10/12/23.  - I had an extensive discussion with him previously regarding his medication intolerances.  He is unable to tolerate ARB's, Arni's, beta-blockers, SGLT2 inhibitors and essentially any afterload reduction.  I explained to him that his mortality remains significantly high without any GDMT.  After lengthy discussion he has agreed to starting losartan  12.5mg  daily. Will uptitrate slowly and add on GDMT.   2.  Permanent atrial fibrillation - Status post multiple ablations - Currently on mexelitine 150mg   3. CKD - Baseline sCr over the past several months 1.8-2.4 - sCr 1.86 on on 08/14/23, reviewed outside records.   4. CVA - LDL 54 - Admitted in 6/25 with acute stroke; motor deficits now mostly resolved.   Brent Ray Advanced Heart Failure Mechanical Circulatory Support

## 2023-09-21 ENCOUNTER — Ambulatory Visit (HOSPITAL_COMMUNITY)
Admission: RE | Admit: 2023-09-21 | Discharge: 2023-09-21 | Disposition: A | Discharge status: Home / Self Care | Source: Ambulatory Visit | Attending: Cardiology | Admitting: Cardiology

## 2023-09-21 ENCOUNTER — Encounter (HOSPITAL_COMMUNITY): Payer: Self-pay | Admitting: Cardiology

## 2023-09-21 VITALS — BP 130/80 | HR 66

## 2023-09-21 DIAGNOSIS — R531 Weakness: Secondary | ICD-10-CM

## 2023-09-21 DIAGNOSIS — I5022 Chronic systolic (congestive) heart failure: Secondary | ICD-10-CM | POA: Diagnosis present

## 2023-09-21 DIAGNOSIS — I639 Cerebral infarction, unspecified: Secondary | ICD-10-CM | POA: Diagnosis not present

## 2023-09-21 LAB — BASIC METABOLIC PANEL WITH GFR
Anion gap: 12 (ref 5–15)
BUN: 18 mg/dL (ref 8–23)
CO2: 19 mmol/L — ABNORMAL LOW (ref 22–32)
Calcium: 9.4 mg/dL (ref 8.9–10.3)
Chloride: 107 mmol/L (ref 98–111)
Creatinine, Ser: 2.08 mg/dL — ABNORMAL HIGH (ref 0.61–1.24)
GFR, Estimated: 32 mL/min — ABNORMAL LOW (ref >60)
Glucose, Bld: 85 mg/dL (ref 70–99)
Potassium: 4.4 mmol/L (ref 3.5–5.1)
Sodium: 138 mmol/L (ref 135–145)

## 2023-09-21 LAB — BRAIN NATRIURETIC PEPTIDE: B Natriuretic Peptide: 1036 pg/mL — ABNORMAL HIGH (ref 0.0–100.0)

## 2023-09-21 NOTE — Patient Instructions (Signed)
 Medication Changes:  No Changes In Medications at this time.   Lab Work:  Labs done today, your results will be available in MyChart, we will contact you for abnormal readings.  Referrals:  REFERRAL FAXED TO PHYSICAL THERAPY IN [place] [facility]---THEIR OFFICE WILL REACH OUT TO ARRANGE   Follow-Up in: 3 MONTHS PLEASE CALL OUR OFFICE AROUND AUGUST TO GET SCHEDULED FOR YOUR APPOINTMENT. PHONE NUMBER IS [phone] OPTION 2   At the [facility], you and your health needs are our priority. We have a designated team specialized in the treatment of Heart Failure. This Care Team includes your primary Heart Failure Specialized Cardiologist (physician), Advanced Practice Providers (APPs- Physician Assistants and Nurse Practitioners), and Pharmacist who all work together to provide you with the care you need, when you need it.   You may see any of the following providers on your designated Care Team at your next follow up:  Dr. Toribio Fuel Dr. Ezra Shuck Dr. Ria Commander Dr. Odis Brownie Greig Mosses, NP Caffie Shed, [place] [facility] [place], [place] Beckey Coe, NP [place] Lee, NP Tinnie Redman, PharmD   Please be sure to bring in all your medications bottles to every appointment.   Need to Contact Us :  If you have any questions or concerns before your next appointment please send us  a message through [place] or call our office at [phone].    TO LEAVE A MESSAGE FOR THE NURSE SELECT OPTION 2, PLEASE LEAVE A MESSAGE INCLUDING: YOUR NAME DATE OF BIRTH CALL BACK NUMBER REASON FOR CALL**this is important as we prioritize the call backs  YOU WILL RECEIVE A CALL BACK THE SAME DAY AS LONG AS YOU CALL BEFORE 4:00 PM

## 2023-09-25 ENCOUNTER — Encounter: Payer: Self-pay | Admitting: Internal Medicine

## 2023-09-25 ENCOUNTER — Ambulatory Visit: Attending: Internal Medicine | Admitting: Internal Medicine

## 2023-09-25 VITALS — BP 120/62 | HR 74 | Ht 68.0 in | Wt 115.6 lb

## 2023-09-25 DIAGNOSIS — Z79899 Other long term (current) drug therapy: Secondary | ICD-10-CM | POA: Diagnosis present

## 2023-09-25 DIAGNOSIS — E782 Mixed hyperlipidemia: Secondary | ICD-10-CM | POA: Insufficient documentation

## 2023-09-25 DIAGNOSIS — I130 Hypertensive heart and chronic kidney disease with heart failure and stage 1 through stage 4 chronic kidney disease, or unspecified chronic kidney disease: Secondary | ICD-10-CM | POA: Diagnosis not present

## 2023-09-25 DIAGNOSIS — I341 Nonrheumatic mitral (valve) prolapse: Secondary | ICD-10-CM | POA: Insufficient documentation

## 2023-09-25 DIAGNOSIS — R5383 Other fatigue: Secondary | ICD-10-CM | POA: Diagnosis present

## 2023-09-25 DIAGNOSIS — I5022 Chronic systolic (congestive) heart failure: Secondary | ICD-10-CM | POA: Diagnosis not present

## 2023-09-25 DIAGNOSIS — N1831 Chronic kidney disease, stage 3a: Secondary | ICD-10-CM | POA: Insufficient documentation

## 2023-09-25 DIAGNOSIS — I4821 Permanent atrial fibrillation: Secondary | ICD-10-CM | POA: Diagnosis not present

## 2023-09-25 DIAGNOSIS — I2510 Atherosclerotic heart disease of native coronary artery without angina pectoris: Secondary | ICD-10-CM | POA: Diagnosis not present

## 2023-09-25 DIAGNOSIS — I7771 Dissection of carotid artery: Secondary | ICD-10-CM | POA: Insufficient documentation

## 2023-09-25 DIAGNOSIS — Z8719 Personal history of other diseases of the digestive system: Secondary | ICD-10-CM | POA: Diagnosis present

## 2023-09-25 MED ORDER — LOSARTAN POTASSIUM 25 MG PO TABS: 12.5000 mg | ORAL_TABLET | Freq: Every day | ORAL | 1 refills | Status: DC

## 2023-09-25 NOTE — Patient Instructions (Signed)
 Medication Instructions:   Begin Losartan  12.5mg  daily  Continue all other medications.     Labwork:  Iron panel with Ferritin - order given today Office will contact with results via phone, letter or mychart.     Testing/Procedures:  none  Follow-Up:  Your physician wants you to follow up in:  1 year.  You should receive a recall letter in the mail about 2 months prior to the time you are due.  If you don't receive this, please call our office to schedule your follow up appointment.      Any Other Special Instructions Will Be Listed Below (If Applicable).   If you need a refill on your cardiac medications before your next appointment, please call your pharmacy.

## 2023-09-25 NOTE — Progress Notes (Signed)
 Cardiology Office Note  Date: 09/25/2023   ID: Brent JINNY Marien Mickey., DOB 08/28/1945, MRN [id number]  PCP:  Meade Bigness, MD  Cardiologist:  Diannah SHAUNNA Maywood, MD Electrophysiologist:  Eulas FORBES Furbish, MD   History of Present Illness: Jabaree Mercado. is a 78 y.o. 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  I reviewed the records from [facility] and updated PMH as stated above. Patient was diagnosed with dilated cardiomyopathy based on TTE from 2022 that showed LVEF 30 to 35%, abnormal septal motion consistent with paced rhythm, at least moderate MR/TR, RV is hypokinetic. He underwent R/LHC in 2022 that showed calcified proximal vessels, 20% LCx, 20% OM1, 50% pRCA (with negative FFR), LVEDP 7, PCWP 14, PA 24/15/19, RV 21/6, RA 6, CO 5.9, CI 3.5.  He had been pacemaker dual-chamber in 2017 for unclear reason that was upgraded to CRT-D in 2023 due to drop in LVEF, RV lead extraction was performed.  He underwent multiple ablations for A-fib and based on his device interrogations, he continued to be in A-fib.  He did not tolerate spironolactone , had gynecomastia, spironolactone  was switched to eplerenone.  Eventually, patient stopped taking all his medications as he did not tolerate meds and was having SOB with bilateral lower extremity swelling when he was taking his medications.  His BMI is 19.25, very low.  Patient is here, accompanied by wife.  Jan 2025: He was seen by EP recently on 03/12/2023 and underwent device interrogation.  Findings are consistent with possible fluid accumulation of the last several days.   He was also noted to have 2 episodes of NSVT, less than 20 beats.  EKG showed V paced rhythm and frequent PVCs.  He was started on metoprolol  by EP.  He did not tolerate the medication and self discontinued.  He was started on bisoprolol  2.5 mg once daily in the last clinic visit which he is tolerating well.  He continues to have fatigue but this fatigue was present even before metoprolol  was started.  He probably has fatigue from frequent PVCs.  Patient also reported having DOE for the last couple of months and productive cough of whitish sputum in the last couple of days.  He also does have some runny nose.  He was started on p.o. Lasix  20 mg once daily with with mild improvement in his symptoms.  But his serum creatinine was 2.3 around 2 days ago.  His baseline serum creatinine was 1.5 around 1 year ago.  He was having hematochezia and was seen by GI physician in [facility].    May 2025: His DOE was worsening in the last 4 to 6 weeks with some leg swelling and apnea.  No PND.  No angina, dizziness, syncope.  He has nosebleeds daily.  He also has been bleeding from his hemorrhoids.  I reviewed his recent blood work that showed proBNP 31,000, chloride 110, bicarb 18 and serum creatinine 1.9.  He has bilateral arm bruising from Plavix.  He is interested to undergo Watchman procedure and A-fib ablation.  He follows up with Dr. Cleotilde, electrophysiology.  July 2025: Patient  had a fall at a grocery store due to severe hypoglycemia in the last week of May 2025.  Fall resulted in bleeding from his elbow and forehead.  After couple of weeks, in June 2025, he started to have pain radiating from left jaw to his left head and left painless vision loss.  Pain and vision loss completely resolved in 3 to 4 hours.  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  Neurology recommended patient to be on Eliquis  and aspirin  81 mg once daily for 3 months followed by Eliquis  alone.  He is doing great now.  He feels extremely weak.  Has some shortness of breath but compensated.  No chest pain.  No dizziness, syncope.  He has some mild leg swelling right above his ankle.   Past Medical History:  Diagnosis Date   A-fib [facility])    AICD (automatic cardioverter/defibrillator) present    Barrett's esophagus 2011   In [place] . Records in Epic   CHF (congestive heart failure) (HCC)    Chronic kidney disease    Coronary atherosclerosis of native coronary artery    Nonobstructive   Essential hypertension, benign    GERD (gastroesophageal reflux disease)    Heart block AV first degree    Chronic   History of kidney stones    Hypercholesterolemia    Mixed hyperlipidemia    NSVT (nonsustained ventricular tachycardia) (HCC)    Holter monitor, 2011; recommended RF ablation by Dr. Kelsie - never required   PAF (paroxysmal atrial fibrillation) [facility])    Recently documented in [facility] June 2016   Pre-diabetes    Rheumatoid arthritis (HCC)    Sinus bradycardia    Symptomatic, acebutolol discontinued   Skin cancer    Sleep apnea    pt denies    Past Surgical History:  Procedure Laterality Date   CARDIAC CATHETERIZATION N/A 12/13/2015   Procedure: Left Heart Cath and Coronary Angiography;  Surgeon: Dorn JINNY Lesches, MD;  Location: [facility] INVASIVE CV LAB;  Service: Cardiovascular;  Laterality: N/A;   CARDIOVERSION N/A 04/11/2016   Procedure: CARDIOVERSION;  Surgeon: Annabella Scarce, MD;  Location: [facility] ENDOSCOPY;  Service: Cardiovascular;  Laterality: N/A;   COLONOSCOPY  03/13/2012   [place], [place]--Dr. Spainhour   EP IMPLANTABLE DEVICE N/A 02/01/2016   Procedure: Pacemaker Implant;  Surgeon: Lynwood Kelsie, MD;  Location: [facility] INVASIVE CV LAB;  Service: Cardiovascular;  Laterality: N/A;    ESOPHAGOGASTRODUODENOSCOPY  03/13/2012   [place], [place]--Dr. Spainhour   HERNIA REPAIR     2   IR ANGIO INTRA EXTRACRAN SEL COM CAROTID INNOMINATE BILAT MOD SED  08/27/2023   IR ANGIO VERTEBRAL SEL VERTEBRAL UNI L MOD SED  08/27/2023   KIDNEY STONE SURGERY     LEFT HEART CATHETERIZATION WITH CORONARY ANGIOGRAM N/A 08/20/2012   Procedure: LEFT HEART CATHETERIZATION WITH CORONARY ANGIOGRAM;  Surgeon: Lynwood Schilling, MD;  Location: [facility] CATH LAB;  Service: Cardiovascular;  Laterality: N/A;   Left leg surgery  03/13/1977   RADIOLOGY WITH ANESTHESIA N/A 08/27/2023   Procedure: RADIOLOGY WITH ANESTHESIA;  Surgeon: Dolphus Carrion, MD;  Location: [facility] OR;  Service: Radiology;  Laterality: N/A;   SKIN CANCER EXCISION     TOTAL HIP ARTHROPLASTY Left 01/11/2022   Procedure: TOTAL HIP ARTHROPLASTY ANTERIOR APPROACH;  Surgeon: Fidel Rogue, MD;  Location: [facility] ORS;  Service: Orthopedics;  Laterality: Left;  150    Current Outpatient Medications  Medication Sig Dispense Refill   acetaminophen  (TYLENOL ) 325 MG tablet Take 650 mg by mouth as needed.     apixaban  (ELIQUIS ) 2.5 MG TABS tablet Take 1 tablet (2.5 mg total) by mouth 2 (two) times daily. 60 tablet 0   aspirin  81 MG chewable tablet Chew 1 tablet (81 mg total) by mouth daily. 21 tablet 0   CRANBERRY PO Take 500 mg by mouth daily.     folic acid  (FOLVITE ) 1 MG tablet Take 1 mg by mouth daily.  NOT on Sunday d/t methotrexate      furosemide  (LASIX ) 20 MG tablet Take 1 tablet (20 mg total) by mouth daily. 30 tablet 3   methotrexate  (RHEUMATREX) 2.5 MG tablet Take 2.5 mg by mouth every Friday. 6 pills weekly     mexiletine (MEXITIL ) 150 MG capsule Take 150 mg by mouth 2 (two) times daily.     NITROSTAT  0.4 MG SL tablet DISSOLVE 1 TABLET UNDER TONGUE EVERY 5 MINUTES AS NEEDED FOR CHEST PAIN 25 tablet 3   ondansetron  (ZOFRAN ) 4 MG tablet Take 4 mg by mouth every 8 (eight) hours as needed for nausea.     pantoprazole  (PROTONIX ) 40 MG tablet Take 40 mg by  mouth every evening.     potassium chloride  SA (KLOR-CON  M) 20 MEQ tablet Take 20 mEq by mouth daily.     Probiotic Product (PROBIOTIC DAILY PO) Take 110 mg by mouth daily.     rosuvastatin  (CRESTOR ) 5 MG tablet Take 5 mg by mouth every Sunday.     traMADol (ULTRAM) 5 mg/mL SUSP Take 50 mg by mouth as needed.     No current facility-administered medications for this visit.   Allergies:  Amiodarone, Flecainide , Carvedilol , Dapagliflozin, Eplerenone, Flomax [tamsulosin hcl], Lisinopril , Metoprolol , Sacubitril-valsartan, Spironolactone , Statins, and Sulfasalazine   Social History: The patient  reports that he has never smoked. He has never been exposed to tobacco smoke. He has never used smokeless tobacco. He reports current alcohol  use of about 2.0 standard drinks of alcohol  per week. He reports that he does not use drugs.   Family History: The patient's family history includes Colon cancer in his mother; Other in his sister, sister, sister, and son.   ROS:  Please see the history of present illness. Otherwise, complete review of systems is positive for none.  All other systems are reviewed and negative.   Physical Exam: VS:  BP 120/62   Pulse 74   Ht 5' 8 (1.727 m)   Wt 115 lb 9.6 oz (52.4 kg)   SpO2 96%   BMI 17.58 kg/m , BMI Body mass index is 17.58 kg/m.  Wt Readings from Last 3 Encounters:  09/25/23 115 lb 9.6 oz (52.4 kg)  08/29/23 120 lb 5.9 oz (54.6 kg)  08/21/23 123 lb 6.4 oz (56 kg)    General: Patient appears comfortable at rest. HEENT: Conjunctiva and lids normal, oropharynx clear with moist mucosa. Neck: Supple, no elevated JVP or carotid bruits, no thyromegaly. Lungs: Clear to auscultation, nonlabored breathing at rest. Cardiac: Regular rate and rhythm, no S3 or significant systolic murmur, no pericardial rub. Abdomen: Soft, nontender, no hepatomegaly, bowel sounds present, no guarding or rebound. Extremities: No pitting edema, distal pulses 2+. Skin: Warm and  dry. Musculoskeletal: No kyphosis. Neuropsychiatric: Alert and oriented x3, affect grossly appropriate.  Recent Labwork: 08/01/2023: TSH 1.962 08/21/2023: ALT 33; AST 37 08/27/2023: Magnesium 2.0 08/29/2023: Hemoglobin 10.5; Platelets  243 09/21/2023: B Natriuretic Peptide 1,036.0; BUN 18; Creatinine, Ser 2.08; Potassium 4.4; Sodium 138     Component Value Date/Time   CHOL 125 08/22/2023 0451   TRIG 43 08/22/2023 0451   HDL 62 08/22/2023 0451   CHOLHDL 2.0 08/22/2023 0451   VLDL 9 08/22/2023 0451   LDLCALC 54 08/22/2023 0451     Assessment and Plan:   Chronic systolic heart failure, compensated: Feeling tired and SOB after fall and ICA dissection but his SOB is better than before. Continue p.o lasix  20 mg once daily. Did not tolerate metoprolol /bisoprolol  in the past. AHF started losartan  12.5 mg once daily but he did not pick up the prescription, will re-order. Did not tolerate lisinopril , Entresto due to nausea vomiting.  Did not tolerate spironolactone  and eplerenone as well.   Follows up with advanced heart failure.  NICM LVEF 30-35% s/p CRT-D: Plan as above. Follows up with AHF and EP.  Severe fatigue: Obtain iron panel with ferritin. If he meets criteria for iron infusions, will place referral to hematology.  Permanent A-fib s/p PVI and Marshall alcohol  ablation: He underwent multiple ablations for A-fib and continues to be in A-fib. Xarelto  switched to Eliquis  5 mg BID after fall in May-June 2025.  Frequent PVCs: Did not tolerate metoprolol , carvedilol , bisoprolol  previously.  Currently on mexiletine 150 mg twice daily by EP which he seemed to tolerate very well.  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 MRI negative for acute strple but angiogram showed new M3 occlusion. CT perfusion negative. Neurology recommended switching Xarelto  to Eliquis  5 mg BID ans ASA 81 daily for 3 months followed by Eliquis  monotherapy  indefinitely.  Mitral annular flutter s/p ablation: No recurrence of flutter.  Monitor.  Nonobstructive CAD: No angina.  Monitor.  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  Will monitor with serial echocardiograms.  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  On chronic Xarelto .  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   30 minutes spent reviewing prior notes, labs, outside review of labs and interpretation of the labs and answering all the questions with the patient.  Complex decision making. 10 minutes spent in documentation.  Medication Adjustments/Labs and Tests Ordered: Current medicines are reviewed at length with the patient today.  Concerns regarding medicines are outlined above.    Disposition:  Follow up 1 year.  He currently follows up with advanced heart failure and electrophysiology.  Signed, Advaith Lamarque Priya Loghan Kurtzman, MD, 09/25/2023 1:58 PM    [facility] at [facility] [address], [place], [place] [address]

## 2023-09-27 ENCOUNTER — Telehealth: Payer: Self-pay | Admitting: *Deleted

## 2023-09-27 NOTE — Telephone Encounter (Signed)
 Per Dr. Mallipeddi, He is supposed to be on Eliquis  and aspirin  for 3 months followed by Eliquis  alone per June 2025 neurology note.Could you let the patient know please.  Patient informed and verbalized understanding of plan.

## 2023-10-02 DIAGNOSIS — I7771 Dissection of carotid artery: Secondary | ICD-10-CM | POA: Insufficient documentation

## 2023-10-03 ENCOUNTER — Encounter (HOSPITAL_COMMUNITY): Payer: Self-pay | Admitting: Cardiology

## 2023-10-11 ENCOUNTER — Telehealth (HOSPITAL_COMMUNITY): Payer: Self-pay

## 2023-10-11 NOTE — Telephone Encounter (Signed)
 Referral printed and faxed to [facility] at patients request for Physical Therapy.   Fax confirmation received.

## 2023-10-12 ENCOUNTER — Ambulatory Visit: Attending: Cardiovascular Disease | Admitting: Cardiovascular Disease

## 2023-10-12 ENCOUNTER — Encounter: Payer: Self-pay | Admitting: Cardiovascular Disease

## 2023-10-12 VITALS — BP 138/60 | HR 70 | Ht 68.0 in | Wt 115.0 lb

## 2023-10-12 DIAGNOSIS — Z95810 Presence of automatic (implantable) cardiac defibrillator: Secondary | ICD-10-CM | POA: Insufficient documentation

## 2023-10-12 LAB — CUP PACEART INCLINIC DEVICE CHECK
Date Time Interrogation Session: 20250801122822
Implantable Lead Connection Status: 753985
Implantable Lead Connection Status: 753985
Implantable Lead Connection Status: 753985
Implantable Lead Implant Date: 20171121
Implantable Lead Implant Date: 20230414
Implantable Lead Implant Date: 20230414
Implantable Lead Location: 753858
Implantable Lead Location: 753859
Implantable Lead Location: 753860
Implantable Lead Model: 4798
Implantable Lead Model: 5076
Implantable Pulse Generator Implant Date: 20230414

## 2023-10-12 NOTE — Progress Notes (Signed)
  Electrophysiology Office Note:    Date:  10/12/2023   ID:  Brent JINNY Marien Mickey., DOB 05-05-1945, MRN [id number]  PCP:  Meade Bigness, MD   [facility] Providers Cardiologist:  Diannah SHAUNNA Maywood, MD Electrophysiologist:  Eulas FORBES Furbish, MD     Referring MD: Meade Bigness, MD   History of Present Illness:    Brent Carrington. is a 78 y.o. male with a medical history significant for nonischemic cardiomyopathy LVEF 25 to 30%, permanent atrial fibrillation s/p multiple interventions, severe TR and moderate MR referred for device management.     The patient had a dual chamber pacemaker placed in 2017. This was upgraded to a CRT defibrillator in 2023 following the development of cardiomyopathy. He has a history of atrial fibrillation and underwent pulmonary vein isolation in 2018, followed by another ablation with mitral flutter line in 2019. He has had an alcohol  ablation of the VOM.  He has not well tolerated GDMT in the past.  He was on carvedilol  and eplerenone but had progression in shortness of breath and dependent edema until he discontinued these medications --carvedilol  in February and eplerenone in May 2024.  He reports that his shortness of breath, fatigue, edema improved abruptly.  This correlates with the improvement in his OptiVol status on his device as well.  At our prior visit, I started a small dose of metoprolol .  This was discontinued and he was started on metoprolol .      Today, he reports that he has baseline.  He has decent energy levels, no dependent edema.  EKGs/Labs/Other Studies Reviewed Today:     Echocardiogram:  TTE November 2024 EF 30 to 35%.  Mildly dilated left atrium.  Moderate MR.    EKG:         Physical Exam:    VS:  BP 138/60 (BP Location: Left Arm, Cuff Size: Normal)   Pulse 70   Ht 5' 8 (1.727 m)   Wt 115 lb (52.2 kg)   SpO2 98%   BMI 17.49 kg/m     Wt Readings from Last 3 Encounters:  10/12/23 115 lb (52.2 kg)   09/25/23 115 lb 9.6 oz (52.4 kg)  08/29/23 120 lb 5.9 oz (54.6 kg)     GEN: Well nourished, well developed in no acute distress CARDIAC: RRR, no murmurs, rubs, gallops The device site is normal -- no tenderness, edema, drainage, redness, threatened erosion.  RESPIRATORY:  Normal work of breathing MUSCULOSKELETAL: no edema    ASSESSMENT & PLAN:     Nonischemic cardiomyopathy EF 30 to 35% He did not tolerate GDMT in the past and discontinued his medications CRT-D in place, functioning normally I reviewed today's device interrogation.  See Paceart for details.   History of symptomatic bradycardia, heart block Original dual-chamber pacemaker placed in 2017 has been replaced with a CRT-D device in April, 2023  Frequent PVCs BiV pacing is 97% GDMT limited by medication intolerances Continue mexilitine 150mg  BID  (PVC burden ~20% --> ~2%)  Permanent atrial fibrillation Status post multiple ablations including vein of Marshall alcohol  ablation.  Signed, Eulas FORBES Furbish, MD  10/12/2023 12:17 PM    [facility]

## 2023-10-12 NOTE — Patient Instructions (Addendum)
 Medication Instructions:  Continue all current medications.   Labwork: none  Testing/Procedures: none  Follow-Up: 6 months   Any Other Special Instructions Will Be Listed Below (If Applicable).   If you need a refill on your cardiac medications before your next appointment, please call your pharmacy.

## 2023-10-22 NOTE — Progress Notes (Signed)
 Remote ICD transmission.

## 2023-11-21 ENCOUNTER — Ambulatory Visit (INDEPENDENT_AMBULATORY_CARE_PROVIDER_SITE_OTHER): Payer: Self-pay

## 2023-11-21 DIAGNOSIS — I440 Atrioventricular block, first degree: Secondary | ICD-10-CM

## 2023-11-21 LAB — CUP PACEART REMOTE DEVICE CHECK
Battery Remaining Longevity: 74 mo
Battery Voltage: 2.98 V
Brady Statistic AP VP Percent: 0 %
Brady Statistic AP VS Percent: 0 %
Brady Statistic AS VP Percent: 94.29 %
Brady Statistic AS VS Percent: 5.71 %
Brady Statistic RA Percent Paced: INVALID
Brady Statistic RV Percent Paced: 94.29 %
Date Time Interrogation Session: 20250910014915
HighPow Impedance: 65 Ohm
Implantable Lead Connection Status: 753985
Implantable Lead Connection Status: 753985
Implantable Lead Connection Status: 753985
Implantable Lead Implant Date: 20171121
Implantable Lead Implant Date: 20230414
Implantable Lead Implant Date: 20230414
Implantable Lead Location: 753858
Implantable Lead Location: 753859
Implantable Lead Location: 753860
Implantable Lead Model: 4798
Implantable Lead Model: 5076
Implantable Pulse Generator Implant Date: 20230414
Lead Channel Impedance Value: 285 Ohm
Lead Channel Impedance Value: 304 Ohm
Lead Channel Impedance Value: 342 Ohm
Lead Channel Impedance Value: 361 Ohm
Lead Channel Impedance Value: 380 Ohm
Lead Channel Impedance Value: 380 Ohm
Lead Channel Impedance Value: 380 Ohm
Lead Channel Impedance Value: 456 Ohm
Lead Channel Impedance Value: 570 Ohm
Lead Channel Impedance Value: 627 Ohm
Lead Channel Impedance Value: 646 Ohm
Lead Channel Impedance Value: 665 Ohm
Lead Channel Impedance Value: 703 Ohm
Lead Channel Pacing Threshold Amplitude: 0.75 V
Lead Channel Pacing Threshold Amplitude: 1.375 V
Lead Channel Pacing Threshold Pulse Width: 0.4 ms
Lead Channel Pacing Threshold Pulse Width: 0.4 ms
Lead Channel Sensing Intrinsic Amplitude: 1.3 mV
Lead Channel Sensing Intrinsic Amplitude: 10.6 mV
Lead Channel Setting Pacing Amplitude: 2 V
Lead Channel Setting Pacing Amplitude: 2 V
Lead Channel Setting Pacing Pulse Width: 0.4 ms
Lead Channel Setting Pacing Pulse Width: 0.4 ms
Lead Channel Setting Sensing Sensitivity: 0.3 mV
Zone Setting Status: 755011

## 2023-11-30 NOTE — Progress Notes (Signed)
Remote ICD Transmission.

## 2023-12-01 ENCOUNTER — Ambulatory Visit: Payer: Self-pay | Admitting: Cardiovascular Disease

## 2023-12-13 NOTE — Telephone Encounter (Signed)
 Updated referral faxed to continue services Confirmation received

## 2023-12-14 ENCOUNTER — Ambulatory Visit (HOSPITAL_COMMUNITY)
Admission: RE | Admit: 2023-12-14 | Discharge: 2023-12-14 | Disposition: A | Discharge status: Home / Self Care | Source: Ambulatory Visit | Attending: Adult Health | Admitting: Adult Health

## 2023-12-14 ENCOUNTER — Ambulatory Visit (HOSPITAL_COMMUNITY): Payer: Self-pay | Admitting: Adult Health

## 2023-12-14 ENCOUNTER — Encounter (HOSPITAL_COMMUNITY): Payer: Self-pay | Admitting: Cardiology

## 2023-12-14 VITALS — BP 142/78 | HR 83 | Ht 68.0 in | Wt 113.4 lb

## 2023-12-14 DIAGNOSIS — R0602 Shortness of breath: Secondary | ICD-10-CM | POA: Diagnosis not present

## 2023-12-14 DIAGNOSIS — N189 Chronic kidney disease, unspecified: Secondary | ICD-10-CM | POA: Insufficient documentation

## 2023-12-14 DIAGNOSIS — Z86718 Personal history of other venous thrombosis and embolism: Secondary | ICD-10-CM | POA: Diagnosis not present

## 2023-12-14 DIAGNOSIS — I4821 Permanent atrial fibrillation: Secondary | ICD-10-CM | POA: Diagnosis not present

## 2023-12-14 DIAGNOSIS — N1831 Chronic kidney disease, stage 3a: Secondary | ICD-10-CM | POA: Diagnosis not present

## 2023-12-14 DIAGNOSIS — Z79899 Other long term (current) drug therapy: Secondary | ICD-10-CM | POA: Insufficient documentation

## 2023-12-14 DIAGNOSIS — Z8673 Personal history of transient ischemic attack (TIA), and cerebral infarction without residual deficits: Secondary | ICD-10-CM | POA: Insufficient documentation

## 2023-12-14 DIAGNOSIS — I071 Rheumatic tricuspid insufficiency: Secondary | ICD-10-CM | POA: Insufficient documentation

## 2023-12-14 DIAGNOSIS — R5383 Other fatigue: Secondary | ICD-10-CM | POA: Diagnosis not present

## 2023-12-14 DIAGNOSIS — I2510 Atherosclerotic heart disease of native coronary artery without angina pectoris: Secondary | ICD-10-CM | POA: Insufficient documentation

## 2023-12-14 DIAGNOSIS — I5022 Chronic systolic (congestive) heart failure: Secondary | ICD-10-CM | POA: Insufficient documentation

## 2023-12-14 DIAGNOSIS — Z95810 Presence of automatic (implantable) cardiac defibrillator: Secondary | ICD-10-CM | POA: Insufficient documentation

## 2023-12-14 DIAGNOSIS — I428 Other cardiomyopathies: Secondary | ICD-10-CM | POA: Diagnosis not present

## 2023-12-14 LAB — BASIC METABOLIC PANEL WITH GFR
Anion gap: 12 (ref 5–15)
BUN: 33 mg/dL — ABNORMAL HIGH (ref 8–23)
CO2: 19 mmol/L — ABNORMAL LOW (ref 22–32)
Calcium: 9.1 mg/dL (ref 8.9–10.3)
Chloride: 107 mmol/L (ref 98–111)
Creatinine, Ser: 1.69 mg/dL — ABNORMAL HIGH (ref 0.61–1.24)
GFR, Estimated: 42 mL/min — ABNORMAL LOW (ref >60)
Glucose, Bld: 97 mg/dL (ref 70–99)
Potassium: 3.5 mmol/L (ref 3.5–5.1)
Sodium: 138 mmol/L (ref 135–145)

## 2023-12-14 LAB — BRAIN NATRIURETIC PEPTIDE: B Natriuretic Peptide: 399.8 pg/mL — ABNORMAL HIGH (ref 0.0–100.0)

## 2023-12-14 NOTE — Progress Notes (Signed)
   ADVANCED HEART FAILURE CLINIC NOTE  Referring Physician: Meade Bigness, MD  Primary Care: Meade Bigness, MD Primary Cardiologist:  CC: Chronic systolic heart failure  HPI: Brent Ray. is a 78 y.o. 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    Unfortunately he reports intolerance to almost every class of guideline directed medical therapy available. During previous HF visit he agreed to start trying losartan  due to a rapid decline in functional status. Unfortunately on the way home from clinic he had a stroke leading to a week long hospitalization (left ICA occlusion). Overall he has regained motor function but is much more weak and deconditioned now.   Today he returns for HF follow up with his wife. He is getting over a sinus infection. Complaining  of fatigue. Having mild shortness of breath with exertion.  Denies PND/Orthopnea. No difficulty sleeping. Appetite poor. No fever or chills. Weight at home  has been stable. Taking all medications. Followed by PT 3 days a weke.    PHYSICAL EXAM: Vitals:   12/14/23 1034  BP: (!) 142/78  Pulse: 83  SpO2: 100%   Wt Readings from Last 3 Encounters:  12/14/23 51.4 kg (113 lb 6.4 oz)  10/12/23 52.2 kg (115 lb)  09/25/23 52.4 kg (115 lb 9.6 oz)    General:Thin.  No resp difficulty Neck: no JVD.  Cor: Regular rate & rhythm.  Lungs: clear Abdomen: soft, nontender, nondistended.  Extremities: no  edema Neuro: alert & oriented x3   DATA REVIEW  ECG: 08/21/23: atrial fibrillation with BiV pacing.  as per my personal interpretation  ECHO: 01/29/23: LVEF 30-35%, normal RV function, mod MR.  As per my personal  interpretation 08/22/23: LVEF 35-40%  ASSESSMENT & PLAN:  Chronic HFrEF - Etiology: Nonischemic cardiomyopathy - NYHA Class: II-III. Functionally doing ok. Symptoms to be related to URI.  - Volume status:  Appears euvolemic. Hold lasix  today. Check bmet. If ok restart lasix  20 mg daily. - GDMT limited by drug intolerance.  - ACEi/ARB/ARNI: losartan  12.5mg  daily at bedtime.  - Aldosterone antagonist: Intolerant - Beta-blocker: Intolerant - Digoxin: Intolerant - Hydralazine /Nitrates: Intolerant - SGLT2i: Intolerant - Advanced therapies: not a candidate.  - ICD: CRT-D in place.  - Dr Gardenia had extensive discussions with him previously regarding his medication intolerances.  He is unable to tolerate ARB's, Arni's, beta-blockers, SGLT2 inhibitors and essentially any afterload reduction. Check BMET  2.  Permanent atrial fibrillation - Status post multiple ablations - Currently on mexelitine 150mg   3. CKD - Baseline sCr over the past several months 1.8-2.4 - sCr 2.1  09/21/23  He has follow up with Nephrology   4. CVA - LDL 54 - Admitted in 6/25 with acute stroke; motor deficits now mostly resolved.    Follow up in 3 months. Consider CPX test next visit. I spent 30 minutes reviewing records, interviewing/examining patient, and managing orders.      Brent Basulto NP-C  10:40 AM

## 2023-12-14 NOTE — Patient Instructions (Signed)
 Medication Changes:  PLEASE DO NOT TAKE LASIX  (FUROSEMIDE ) TODAY, UNTIL WE GET LAB RESULTS BACK   Lab Work:  Labs done today, your results will be available in MyChart, we will contact you for abnormal readings.  Follow-Up in: 3 MONTHS WITH APP AS SCHEDULED   At the [facility], you and your health needs are our priority. We have a designated team specialized in the treatment of Heart Failure. This Care Team includes your primary Heart Failure Specialized Cardiologist (physician), Advanced Practice Providers (APPs- Physician Assistants and Nurse Practitioners), and Pharmacist who all work together to provide you with the care you need, when you need it.   You may see any of the following providers on your designated Care Team at your next follow up:  Dr. Toribio Fuel Dr. Ezra Shuck Dr. Ria Commander Dr. Odis Brownie Greig Mosses, NP Caffie Shed, [place] [facility] [place], [place] Beckey Coe, NP [place] Lee, NP Tinnie Redman, PharmD   Please be sure to bring in all your medications bottles to every appointment.   Need to Contact Us :  If you have any questions or concerns before your next appointment please send us  a message through [place] or call our office at [phone].    TO LEAVE A MESSAGE FOR THE NURSE SELECT OPTION 2, PLEASE LEAVE A MESSAGE INCLUDING: YOUR NAME DATE OF BIRTH CALL BACK NUMBER REASON FOR CALL**this is important as we prioritize the call backs  YOU WILL RECEIVE A CALL BACK THE SAME DAY AS LONG AS YOU CALL BEFORE 4:00 PM

## 2024-01-02 ENCOUNTER — Encounter (HOSPITAL_COMMUNITY): Payer: Self-pay

## 2024-01-02 ENCOUNTER — Other Ambulatory Visit: Payer: Self-pay

## 2024-01-02 ENCOUNTER — Emergency Department (HOSPITAL_COMMUNITY)

## 2024-01-02 ENCOUNTER — Inpatient Hospital Stay (HOSPITAL_COMMUNITY)
Admission: EM | Admit: 2024-01-02 | Discharge: 2024-01-06 | DRG: 377 | Disposition: A | Discharge status: Home / Self Care | Attending: Family Medicine | Admitting: Family Medicine

## 2024-01-02 DIAGNOSIS — Z95810 Presence of automatic (implantable) cardiac defibrillator: Secondary | ICD-10-CM

## 2024-01-02 DIAGNOSIS — I4819 Other persistent atrial fibrillation: Secondary | ICD-10-CM | POA: Diagnosis not present

## 2024-01-02 DIAGNOSIS — I130 Hypertensive heart and chronic kidney disease with heart failure and stage 1 through stage 4 chronic kidney disease, or unspecified chronic kidney disease: Secondary | ICD-10-CM | POA: Diagnosis present

## 2024-01-02 DIAGNOSIS — Z8673 Personal history of transient ischemic attack (TIA), and cerebral infarction without residual deficits: Secondary | ICD-10-CM

## 2024-01-02 DIAGNOSIS — R7303 Prediabetes: Secondary | ICD-10-CM | POA: Diagnosis present

## 2024-01-02 DIAGNOSIS — K7460 Unspecified cirrhosis of liver: Secondary | ICD-10-CM | POA: Diagnosis present

## 2024-01-02 DIAGNOSIS — I4821 Permanent atrial fibrillation: Secondary | ICD-10-CM | POA: Diagnosis present

## 2024-01-02 DIAGNOSIS — N1832 Chronic kidney disease, stage 3b: Secondary | ICD-10-CM | POA: Diagnosis present

## 2024-01-02 DIAGNOSIS — D696 Thrombocytopenia, unspecified: Secondary | ICD-10-CM | POA: Diagnosis present

## 2024-01-02 DIAGNOSIS — K922 Gastrointestinal hemorrhage, unspecified: Secondary | ICD-10-CM

## 2024-01-02 DIAGNOSIS — D509 Iron deficiency anemia, unspecified: Secondary | ICD-10-CM | POA: Diagnosis present

## 2024-01-02 DIAGNOSIS — Z85828 Personal history of other malignant neoplasm of skin: Secondary | ICD-10-CM

## 2024-01-02 DIAGNOSIS — Z96642 Presence of left artificial hip joint: Secondary | ICD-10-CM | POA: Diagnosis present

## 2024-01-02 DIAGNOSIS — Z79899 Other long term (current) drug therapy: Secondary | ICD-10-CM

## 2024-01-02 DIAGNOSIS — E538 Deficiency of other specified B group vitamins: Secondary | ICD-10-CM | POA: Diagnosis present

## 2024-01-02 DIAGNOSIS — K254 Chronic or unspecified gastric ulcer with hemorrhage: Principal | ICD-10-CM | POA: Diagnosis present

## 2024-01-02 DIAGNOSIS — K2270 Barrett's esophagus without dysplasia: Secondary | ICD-10-CM | POA: Diagnosis present

## 2024-01-02 DIAGNOSIS — D539 Nutritional anemia, unspecified: Secondary | ICD-10-CM | POA: Diagnosis present

## 2024-01-02 DIAGNOSIS — Z681 Body mass index (BMI) 19 or less, adult: Secondary | ICD-10-CM

## 2024-01-02 DIAGNOSIS — K068 Other specified disorders of gingiva and edentulous alveolar ridge: Secondary | ICD-10-CM | POA: Diagnosis present

## 2024-01-02 DIAGNOSIS — I428 Other cardiomyopathies: Secondary | ICD-10-CM | POA: Diagnosis present

## 2024-01-02 DIAGNOSIS — I493 Ventricular premature depolarization: Secondary | ICD-10-CM | POA: Diagnosis present

## 2024-01-02 DIAGNOSIS — Z888 Allergy status to other drugs, medicaments and biological substances status: Secondary | ICD-10-CM

## 2024-01-02 DIAGNOSIS — Z79631 Long term (current) use of antimetabolite agent: Secondary | ICD-10-CM

## 2024-01-02 DIAGNOSIS — I340 Nonrheumatic mitral (valve) insufficiency: Secondary | ICD-10-CM | POA: Diagnosis present

## 2024-01-02 DIAGNOSIS — R079 Chest pain, unspecified: Principal | ICD-10-CM | POA: Diagnosis present

## 2024-01-02 DIAGNOSIS — Z8679 Personal history of other diseases of the circulatory system: Secondary | ICD-10-CM

## 2024-01-02 DIAGNOSIS — D649 Anemia, unspecified: Secondary | ICD-10-CM | POA: Insufficient documentation

## 2024-01-02 DIAGNOSIS — Z882 Allergy status to sulfonamides status: Secondary | ICD-10-CM

## 2024-01-02 DIAGNOSIS — I3139 Other pericardial effusion (noninflammatory): Secondary | ICD-10-CM | POA: Diagnosis present

## 2024-01-02 DIAGNOSIS — R040 Epistaxis: Secondary | ICD-10-CM | POA: Diagnosis present

## 2024-01-02 DIAGNOSIS — E43 Unspecified severe protein-calorie malnutrition: Secondary | ICD-10-CM | POA: Diagnosis present

## 2024-01-02 DIAGNOSIS — Z86718 Personal history of other venous thrombosis and embolism: Secondary | ICD-10-CM

## 2024-01-02 DIAGNOSIS — K319 Disease of stomach and duodenum, unspecified: Secondary | ICD-10-CM | POA: Diagnosis present

## 2024-01-02 DIAGNOSIS — K259 Gastric ulcer, unspecified as acute or chronic, without hemorrhage or perforation: Secondary | ICD-10-CM

## 2024-01-02 DIAGNOSIS — Z800 Family history of malignant neoplasm of digestive organs: Secondary | ICD-10-CM

## 2024-01-02 DIAGNOSIS — K219 Gastro-esophageal reflux disease without esophagitis: Secondary | ICD-10-CM | POA: Diagnosis present

## 2024-01-02 DIAGNOSIS — I2510 Atherosclerotic heart disease of native coronary artery without angina pectoris: Secondary | ICD-10-CM | POA: Diagnosis present

## 2024-01-02 DIAGNOSIS — M069 Rheumatoid arthritis, unspecified: Secondary | ICD-10-CM | POA: Diagnosis present

## 2024-01-02 DIAGNOSIS — Z7901 Long term (current) use of anticoagulants: Secondary | ICD-10-CM

## 2024-01-02 DIAGNOSIS — N183 Chronic kidney disease, stage 3 unspecified: Secondary | ICD-10-CM | POA: Diagnosis present

## 2024-01-02 DIAGNOSIS — I10 Essential (primary) hypertension: Secondary | ICD-10-CM | POA: Diagnosis present

## 2024-01-02 DIAGNOSIS — E782 Mixed hyperlipidemia: Secondary | ICD-10-CM | POA: Diagnosis present

## 2024-01-02 DIAGNOSIS — I5022 Chronic systolic (congestive) heart failure: Secondary | ICD-10-CM | POA: Diagnosis present

## 2024-01-02 DIAGNOSIS — I5020 Unspecified systolic (congestive) heart failure: Secondary | ICD-10-CM

## 2024-01-02 LAB — CBC WITH DIFFERENTIAL/PLATELET
Abs Immature Granulocytes: 0.05 K/uL (ref 0.00–0.07)
Basophils Absolute: 0 K/uL (ref 0.0–0.1)
Basophils Relative: 0 %
Eosinophils Absolute: 0 K/uL (ref 0.0–0.5)
Eosinophils Relative: 0 %
HCT: 28.6 % — ABNORMAL LOW (ref 39.0–52.0)
Hemoglobin: 9.2 g/dL — ABNORMAL LOW (ref 13.0–17.0)
Immature Granulocytes: 1 %
Lymphocytes Relative: 3 %
Lymphs Abs: 0.2 K/uL — ABNORMAL LOW (ref 0.7–4.0)
MCH: 34.2 pg — ABNORMAL HIGH (ref 26.0–34.0)
MCHC: 32.2 g/dL (ref 30.0–36.0)
MCV: 106.3 fL — ABNORMAL HIGH (ref 80.0–100.0)
Monocytes Absolute: 0.1 K/uL (ref 0.1–1.0)
Monocytes Relative: 1 %
Neutro Abs: 6.9 K/uL (ref 1.7–7.7)
Neutrophils Relative %: 95 %
Platelets: 232 K/uL (ref 150–400)
RBC: 2.69 MIL/uL — ABNORMAL LOW (ref 4.22–5.81)
RDW: 16.2 % — ABNORMAL HIGH (ref 11.5–15.5)
WBC: 7.2 K/uL (ref 4.0–10.5)
nRBC: 0 % (ref 0.0–0.2)

## 2024-01-02 LAB — COMPREHENSIVE METABOLIC PANEL WITH GFR
ALT: 30 U/L (ref 0–44)
AST: 29 U/L (ref 15–41)
Albumin: 4 g/dL (ref 3.5–5.0)
Alkaline Phosphatase: 67 U/L (ref 38–126)
Anion gap: 14 (ref 5–15)
BUN: 24 mg/dL — ABNORMAL HIGH (ref 8–23)
CO2: 20 mmol/L — ABNORMAL LOW (ref 22–32)
Calcium: 9.1 mg/dL (ref 8.9–10.3)
Chloride: 106 mmol/L (ref 98–111)
Creatinine, Ser: 1.79 mg/dL — ABNORMAL HIGH (ref 0.61–1.24)
GFR, Estimated: 39 mL/min — ABNORMAL LOW (ref >60)
Glucose, Bld: 102 mg/dL — ABNORMAL HIGH (ref 70–99)
Potassium: 4.1 mmol/L (ref 3.5–5.1)
Sodium: 140 mmol/L (ref 135–145)
Total Bilirubin: 1.2 mg/dL (ref 0.0–1.2)
Total Protein: 6.6 g/dL (ref 6.5–8.1)

## 2024-01-02 LAB — PRO BRAIN NATRIURETIC PEPTIDE: Pro Brain Natriuretic Peptide: 9018 pg/mL — ABNORMAL HIGH (ref <300.0)

## 2024-01-02 LAB — TROPONIN T, HIGH SENSITIVITY
Troponin T High Sensitivity: 24 ng/L — ABNORMAL HIGH (ref 0–19)
Troponin T High Sensitivity: 24 ng/L — ABNORMAL HIGH (ref 0–19)

## 2024-01-02 MED ORDER — APIXABAN 2.5 MG PO TABS
2.5000 mg | ORAL_TABLET | Freq: Two times a day (BID) | ORAL | Status: DC
  Administered 2024-01-02: 2.5 mg via ORAL
  Filled 2024-01-02 (×2): qty 1

## 2024-01-02 MED ORDER — FOLIC ACID 1 MG PO TABS
1.0000 mg | ORAL_TABLET | Freq: Every day | ORAL | Status: DC
  Administered 2024-01-03 – 2024-01-06 (×4): 1 mg via ORAL
  Filled 2024-01-02 (×4): qty 1

## 2024-01-02 MED ORDER — LOSARTAN POTASSIUM 25 MG PO TABS
12.5000 mg | ORAL_TABLET | Freq: Every day | ORAL | Status: DC
Start: 2024-01-02 — End: 2024-01-06
  Administered 2024-01-02 – 2024-01-05 (×4): 12.5 mg via ORAL
  Filled 2024-01-02 (×4): qty 1

## 2024-01-02 MED ORDER — OXYCODONE HCL 5 MG PO TABS: 5.0000 mg | ORAL_TABLET | ORAL | Status: DC | PRN

## 2024-01-02 MED ORDER — ACETAMINOPHEN 325 MG PO TABS
650.0000 mg | ORAL_TABLET | ORAL | Status: DC | PRN
  Administered 2024-01-05: 650 mg via ORAL
  Filled 2024-01-02: qty 2

## 2024-01-02 MED ORDER — ALUM & MAG HYDROXIDE-SIMETH 200-200-20 MG/5ML PO SUSP: 30.0000 mL | ORAL | Status: DC | PRN

## 2024-01-02 MED ORDER — MEXILETINE HCL 150 MG PO CAPS
150.0000 mg | ORAL_CAPSULE | Freq: Two times a day (BID) | ORAL | Status: DC
  Administered 2024-01-03 – 2024-01-06 (×7): 150 mg via ORAL
  Filled 2024-01-02 (×10): qty 1

## 2024-01-02 MED ORDER — FENTANYL CITRATE (PF) 100 MCG/2ML IJ SOLN
100.0000 ug | Freq: Once | INTRAMUSCULAR | Status: AC
  Administered 2024-01-02: 100 ug via INTRAVENOUS
  Filled 2024-01-02: qty 2

## 2024-01-02 MED ORDER — NITROGLYCERIN 2 % TD OINT
0.5000 [in_us] | TOPICAL_OINTMENT | Freq: Once | TRANSDERMAL | Status: AC
  Administered 2024-01-02: 0.5 [in_us] via TOPICAL
  Filled 2024-01-02: qty 1

## 2024-01-02 MED ORDER — LACTATED RINGERS IV BOLUS
500.0000 mL | Freq: Once | INTRAVENOUS | Status: AC
  Administered 2024-01-02: 500 mL via INTRAVENOUS

## 2024-01-02 MED ORDER — FUROSEMIDE 20 MG PO TABS
20.0000 mg | ORAL_TABLET | Freq: Every day | ORAL | Status: DC
  Administered 2024-01-04 – 2024-01-06 (×3): 20 mg via ORAL
  Filled 2024-01-02 (×4): qty 1

## 2024-01-02 MED ORDER — IOHEXOL 350 MG/ML SOLN
80.0000 mL | Freq: Once | INTRAVENOUS | Status: AC | PRN
  Administered 2024-01-02: 80 mL via INTRAVENOUS

## 2024-01-02 MED ORDER — ROSUVASTATIN CALCIUM 10 MG PO TABS
5.0000 mg | ORAL_TABLET | ORAL | Status: DC
  Administered 2024-01-06: 5 mg via ORAL
  Filled 2024-01-02: qty 1

## 2024-01-02 MED ORDER — ONDANSETRON HCL 4 MG/2ML IJ SOLN
4.0000 mg | Freq: Four times a day (QID) | INTRAMUSCULAR | Status: DC | PRN
  Administered 2024-01-04: 4 mg via INTRAVENOUS
  Filled 2024-01-02: qty 2

## 2024-01-02 MED ORDER — PANTOPRAZOLE SODIUM 40 MG PO TBEC
40.0000 mg | DELAYED_RELEASE_TABLET | Freq: Every day | ORAL | Status: DC
  Administered 2024-01-03 – 2024-01-06 (×4): 40 mg via ORAL
  Filled 2024-01-02 (×4): qty 1

## 2024-01-02 MED ORDER — ASPIRIN 81 MG PO CHEW
324.0000 mg | CHEWABLE_TABLET | Freq: Once | ORAL | Status: AC
  Administered 2024-01-02: 324 mg via ORAL
  Filled 2024-01-02: qty 4

## 2024-01-02 NOTE — ED Triage Notes (Signed)
 Pt arrived via POV c/o severe chest pain that began this afternoon. Pt reports taking NTG tablets PTA w/o relief. Pt presents with Hx of A-Fib. Pt reports taking a blood thinner as well.

## 2024-01-02 NOTE — ED Provider Notes (Signed)
 [place] EMERGENCY DEPARTMENT AT [facility] Provider Note   CSN: [id number] Arrival date & time: 01/02/24  1704     Patient presents with: Chest Pain   Brent Ray. is a 78 y.o. male.  {Add pertinent medical, surgical, social history, OB history to HPI:7463} 78 year old male presents for evaluation of chest pain. States it started at 2 PM while he was out working at the shop. He took nitroglycerin  and an aspirin  and it did not help. Describes it as pressure and admits nausea, shortness of breath and lightheadedness. He has a history of pacemaker/defibrillator.    Chest Pain Associated symptoms: no abdominal pain, no back pain, no cough, no fever, no palpitations, no shortness of breath and no vomiting        Prior to Admission medications   Medication Sig Start Date End Date Taking? Authorizing Provider  acetaminophen  (TYLENOL ) 325 MG tablet Take 650 mg by mouth as needed.    [provider]  apixaban  (ELIQUIS ) 2.5 MG TABS tablet Take 1 tablet (2.5 mg total) by mouth 2 (two) times daily. 08/29/23   Odell Celinda Balo, MD  aspirin  81 MG chewable tablet Chew 1 tablet (81 mg total) by mouth daily. 08/29/23   Odell Celinda Balo, MD  chlorhexidine  (PERIDEX ) 0.12 % solution Use as directed 5 mLs in the mouth or throat 2 (two) times daily. 12/03/23   [provider]  CRANBERRY PO Take 500 mg by mouth daily.    [provider]  folic acid  (FOLVITE ) 1 MG tablet Take 1 mg by mouth daily.  NOT on Sunday d/t methotrexate  12/13/22   [provider]  furosemide  (LASIX ) 20 MG tablet Take 1 tablet (20 mg total) by mouth daily. 08/21/23   Sabharwal, Aditya, DO  guaiFENesin (MUCINEX) 600 MG 12 hr tablet Take 600 mg by mouth 2 (two) times daily. 12/07/23   [provider]  losartan  (COZAAR ) 25 MG tablet Take 0.5 tablets (12.5 mg total) by mouth daily. 09/25/23   Mallipeddi, Vishnu P, MD  methotrexate  (RHEUMATREX) 2.5 MG tablet Take 2.5  mg by mouth every Friday. 6 pills weekly    [provider]  mexiletine (MEXITIL ) 150 MG capsule Take 150 mg by mouth 2 (two) times daily.    [provider]  NITROSTAT  0.4 MG SL tablet DISSOLVE 1 TABLET UNDER TONGUE EVERY 5 MINUTES AS NEEDED FOR CHEST PAIN 05/11/14   Debera Jayson MATSU, MD  ondansetron  (ZOFRAN ) 4 MG tablet Take 4 mg by mouth every 8 (eight) hours as needed for nausea. 01/15/23   [provider]  pantoprazole  (PROTONIX ) 40 MG tablet Take 40 mg by mouth every evening.    [provider]  potassium chloride  SA (KLOR-CON  M) 20 MEQ tablet Take 20 mEq by mouth daily.    [provider]  Probiotic Product (PROBIOTIC DAILY PO) Take 110 mg by mouth daily.    [provider]  rosuvastatin  (CRESTOR ) 5 MG tablet Take 5 mg by mouth every Sunday.    [provider]  sodium bicarbonate  650 MG tablet Take 650 mg by mouth daily.    [provider]  traMADol (ULTRAM) 5 mg/mL SUSP Take 50 mg by mouth as needed.    [provider]  XARELTO  15 MG TABS tablet Take 15 mg by mouth daily.    [provider]    Allergies: Amiodarone, Flecainide , Tamsulosin, Tamsulosin hcl, Carvedilol , Dapagliflozin, Eplerenone, Lisinopril , Metoprolol , Sacubitril-valsartan, Spironolactone , Statins, and Sulfasalazine  Review of Systems  Constitutional:  Negative for chills and fever.  HENT:  Negative for ear pain and sore throat.   Eyes:  Negative for pain and visual disturbance.  Respiratory:  Negative for cough and shortness of breath.   Cardiovascular:  Positive for chest pain. Negative for palpitations.  Gastrointestinal:  Negative for abdominal pain and vomiting.  Genitourinary:  Negative for dysuria and hematuria.  Musculoskeletal:  Negative for arthralgias and back pain.  Skin:  Negative for color change and rash.  Neurological:  Negative for seizures and syncope.  All other systems reviewed and are negative.   Updated  Vital Signs BP (!) 147/62 (BP Location: Left Arm)   Pulse 73   Temp 98.9 F (37.2 C) (Oral)   Resp (!) 22   Ht 5' 8 (1.727 m)   Wt 52.2 kg   SpO2 100%   BMI 17.49 kg/m   Physical Exam Vitals and nursing note reviewed.  Constitutional:      General: He is not in acute distress.    Appearance: He is well-developed. He is ill-appearing and diaphoretic.  HENT:     Head: Normocephalic and atraumatic.  Eyes:     Conjunctiva/sclera: Conjunctivae normal.  Cardiovascular:     Rate and Rhythm: Normal rate and regular rhythm.     Heart sounds: No murmur heard. Pulmonary:     Effort: Pulmonary effort is normal. No respiratory distress.     Breath sounds: Normal breath sounds. No decreased breath sounds.  Abdominal:     Palpations: Abdomen is soft.     Tenderness: There is no abdominal tenderness.  Musculoskeletal:        General: No swelling.     Cervical back: Neck supple.  Skin:    General: Skin is warm.     Capillary Refill: Capillary refill takes less than 2 seconds.  Neurological:     General: No focal deficit present.     Mental Status: He is alert.  Psychiatric:        Mood and Affect: Mood normal.     (all labs ordered are listed, but only abnormal results are displayed) Labs Reviewed  CBC WITH DIFFERENTIAL/PLATELET  COMPREHENSIVE METABOLIC PANEL WITH GFR  PRO BRAIN NATRIURETIC PEPTIDE  TROPONIN T, HIGH SENSITIVITY    EKG: EKG Interpretation Date/Time:  Wednesday January 02 2024 17:16:56 EDT Ventricular Rate:  71 PR Interval:    QRS Duration:  142 QT Interval:  434 QTC Calculation: 471 R Axis:   -89  Text Interpretation: Ventricular-paced rhythm Biventricular pacemaker detected  Baseline artifact Compared to prior EKG from 08/21/2023 Confirmed by Gennaro Bouchard ([id number]) on 01/02/2024 5:19:50 PM  Radiology: No results found.  {Document cardiac monitor, telemetry assessment procedure when appropriate:32947} Procedures   Medications Ordered in the ED   aspirin  chewable tablet 324 mg (has no administration in time range)  fentaNYL  (SUBLIMAZE ) injection 100 mcg (has no administration in time range)  nitroGLYCERIN  (NITROGLYN) 2 % ointment 0.5 inch (has no administration in time range)  lactated ringers  bolus 500 mL (has no administration in time range)      {Click here for ABCD2, HEART and other calculators REFRESH Note before signing:1}                              Medical Decision Making Amount and/or Complexity of Data Reviewed Labs: ordered. Radiology: ordered.  Risk OTC drugs. Prescription drug management.   ***  {Document critical  care time when appropriate  Document review of labs and clinical decision tools ie CHADS2VASC2, etc  Document your independent review of radiology images and any outside records  Document your discussion with family members, caretakers and with consultants  Document social determinants of health affecting pt's care  Document your decision making why or why not admission, treatments were needed:32947:::1}   Final diagnoses:  None    ED Discharge Orders     None

## 2024-01-02 NOTE — H&P (Signed)
 History and Physical    Brent Ray. FMW:[id number] DOB: [birth year] DOA: 01/02/2024  PCP: Meade Bigness, MD   Patient coming from: Home   Chief Complaint: Chest pain   HPI: Brent Ray. is a 78 y.o. 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  Patient reports that he was in his usual state and sitting in his truck this afternoon when he developed pain in his central chest.  Pain occurs when he takes a deep breath and is associated with discomfort in his neck/throat.  He feels short of breath when experiencing this pain.  He took a nitroglycerin  at home without relief.  He has never experienced this type of discomfort previously.  He reports adherence with his medications.  ED Course: Upon arrival to the ED, patient is found to be afebrile and saturating well on room air with normal HR and elevated BP.  Labs are most notable for creatinine 1.79, hemoglobin 9.2, troponin 24 x 2, and proBNP 9018.  CTA of the chest, abdomen, and pelvis is negative for acute findings though notable for small pericardial effusion.  Cardiology was consulted by the ED physician and the patient was given 500 mL of LR, 1 inch nitroglycerin  ointment, 324 mg aspirin , and fentanyl .  Review of Systems:  All other systems reviewed and apart from HPI, are negative.  Past Medical History:  Diagnosis Date   A-fib [facility])    AICD (automatic cardioverter/defibrillator) present    Barrett's esophagus 2011   In [place] . Records in Epic   CHF (congestive heart failure) (HCC)    Chronic kidney disease    Coronary atherosclerosis of native coronary artery    Nonobstructive   Essential hypertension, benign    GERD  (gastroesophageal reflux disease)    Heart block AV first degree    Chronic   History of kidney stones    Hypercholesterolemia    Mixed hyperlipidemia    NSVT (nonsustained ventricular tachycardia) (HCC)    Holter monitor, 2011; recommended RF ablation by Dr. Kelsie - never required   PAF (paroxysmal atrial fibrillation) [facility])    Recently documented in [facility] June 2016   Pre-diabetes    Rheumatoid arthritis (HCC)    Sinus bradycardia    Symptomatic, acebutolol discontinued   Skin cancer    Sleep apnea    pt denies    Past Surgical History:  Procedure Laterality Date   CARDIAC CATHETERIZATION N/A 12/13/2015   Procedure: Left Heart Cath and Coronary Angiography;  Surgeon: Dorn JINNY Lesches, MD;  Location: [facility] INVASIVE CV LAB;  Service: Cardiovascular;  Laterality: N/A;   CARDIOVERSION N/A 04/11/2016   Procedure: CARDIOVERSION;  Surgeon: Annabella Scarce, MD;  Location: [facility] ENDOSCOPY;  Service: Cardiovascular;  Laterality: N/A;   COLONOSCOPY  03/13/2012   [place], [place]--Dr. Spainhour   EP IMPLANTABLE DEVICE N/A 02/01/2016   Procedure: Pacemaker Implant;  Surgeon: Lynwood Kelsie, MD;  Location: [facility] INVASIVE CV LAB;  Service: Cardiovascular;  Laterality: N/A;   ESOPHAGOGASTRODUODENOSCOPY  03/13/2012   [place], [place]--Dr. Spainhour   HERNIA REPAIR     2   IR ANGIO INTRA EXTRACRAN SEL COM CAROTID INNOMINATE BILAT MOD SED  08/27/2023   IR ANGIO VERTEBRAL SEL VERTEBRAL UNI L MOD SED  08/27/2023   KIDNEY  STONE SURGERY     LEFT HEART CATHETERIZATION WITH CORONARY ANGIOGRAM N/A 08/20/2012   Procedure: LEFT HEART CATHETERIZATION WITH CORONARY ANGIOGRAM;  Surgeon: Lynwood Schilling, MD;  Location: [facility] CATH LAB;  Service: Cardiovascular;  Laterality: N/A;   Left leg surgery  03/13/1977   RADIOLOGY WITH ANESTHESIA N/A 08/27/2023   Procedure: RADIOLOGY WITH ANESTHESIA;  Surgeon: Dolphus Carrion, MD;  Location: [facility] OR;  Service: Radiology;  Laterality: N/A;   SKIN CANCER EXCISION     TOTAL HIP  ARTHROPLASTY Left 01/11/2022   Procedure: TOTAL HIP ARTHROPLASTY ANTERIOR APPROACH;  Surgeon: Fidel Rogue, MD;  Location: [facility] ORS;  Service: Orthopedics;  Laterality: Left;  150    Social History:   reports that he has never smoked. He has never been exposed to tobacco smoke. He has never used smokeless tobacco. He reports current alcohol  use of about 2.0 standard drinks of alcohol  per week. He reports that he does not use drugs.  Allergies  Allergen Reactions   Amiodarone Other (See Comments)    Severe joint pain and blindness   Flecainide  Other (See Comments)    Severe headaches, blurred vision   Tamsulosin Hives and Itching   Tamsulosin Hcl Hives, Itching, Dermatitis and Rash   Carvedilol  Other (See Comments)    Unknown   Dapagliflozin Diarrhea   Eplerenone Other (See Comments)    Unknown   Lisinopril  Nausea And Vomiting and Nausea Only   Metoprolol  Other (See Comments)    Headaches, dizziness   Sacubitril-Valsartan Nausea And Vomiting and Nausea Only   Spironolactone  Swelling and Other (See Comments)    Breast swelling, headaches   Statins Other (See Comments)    Leg cramps   Sulfasalazine Nausea Only    Family History  Problem Relation Age of Onset   Colon cancer Mother    Other Sister         AGE-78-HEALTHY   Other Sister        AGE 60-HEALTHY   Other Sister        AGE 26 HEALTHY   Other Son        AGE 36 HEALTHY     Prior to Admission medications   Medication Sig Start Date End Date Taking? Authorizing Provider  acetaminophen  (TYLENOL ) 325 MG tablet Take 650 mg by mouth as needed for mild pain (pain score 1-3).   Yes [provider]  apixaban  (ELIQUIS ) 2.5 MG TABS tablet Take 1 tablet (2.5 mg total) by mouth 2 (two) times daily. 08/29/23  Yes Odell Celinda Balo, MD  chlorhexidine  (PERIDEX ) 0.12 % solution Use as directed 5 mLs in the mouth or throat 2 (two) times daily. 12/03/23  Yes [provider]  CRANBERRY PO Take 500 mg by mouth daily.    Yes [provider]  folic acid  (FOLVITE ) 1 MG tablet Take 1 mg by mouth daily.  NOT on Friday d/t methotrexate  12/13/22  Yes [provider]  furosemide  (LASIX ) 20 MG tablet Take 1 tablet (20 mg total) by mouth daily. 08/21/23  Yes Sabharwal, Aditya, DO  ipratropium (ATROVENT) 0.06 % nasal spray Place 2 sprays into both nostrils daily as needed for rhinitis.   Yes [provider]  losartan  (COZAAR ) 25 MG tablet Take 0.5 tablets (12.5 mg total) by mouth daily. Patient taking differently: Take 12.5 mg by mouth in the morning and at bedtime. 09/25/23  Yes Mallipeddi, Vishnu P, MD  methotrexate  (RHEUMATREX) 2.5 MG tablet Take 2.5 mg by mouth every Friday. 6 pills weekly  Yes [provider]  mexiletine (MEXITIL ) 150 MG capsule Take 150 mg by mouth 2 (two) times daily.   Yes [provider]  NITROSTAT  0.4 MG SL tablet DISSOLVE 1 TABLET UNDER TONGUE EVERY 5 MINUTES AS NEEDED FOR CHEST PAIN Patient taking differently: Place 0.4 mg under the tongue every 5 (five) minutes as needed for chest pain. 05/11/14  Yes Debera Jayson MATSU, MD  ondansetron  (ZOFRAN ) 4 MG tablet Take 4 mg by mouth every 8 (eight) hours as needed for nausea. 01/15/23  Yes [provider]  pantoprazole  (PROTONIX ) 40 MG tablet Take 40 mg by mouth daily as needed (heartburn, acid reflux).   Yes [provider]  potassium chloride  SA (KLOR-CON  M) 20 MEQ tablet Take 20 mEq by mouth daily.   Yes [provider]  Probiotic Product (PROBIOTIC DAILY PO) Take 110 mg by mouth daily.   Yes [provider]  rosuvastatin  (CRESTOR ) 5 MG tablet Take 5 mg by mouth every Sunday.   Yes [provider]  sodium bicarbonate  650 MG tablet Take 650 mg by mouth daily.   Yes [provider]  traMADol (ULTRAM) 50 MG tablet Take 50 mg by mouth every 6 (six) hours as needed for moderate pain (pain score 4-6).   Yes [provider]    Physical Exam: Vitals:    01/02/24 2200 01/02/24 2230 01/02/24 2233 01/02/24 2300  BP: 136/74 133/71  (!) 145/73  Pulse: 69 69  69  Resp: 16 15  17   Temp:   98.4 F (36.9 C)   TempSrc:   Oral   SpO2: 98% 99%  99%  Weight:      Height:         Constitutional: NAD, calm  Eyes: PERTLA, lids and conjunctivae normal ENMT: Mucous membranes are moist. Posterior pharynx clear of any exudate or lesions.   Neck: supple, no masses  Respiratory: no wheezing, no crackles. No accessory muscle use.  Cardiovascular: S1 & S2 heard, regular rate and rhythm. Left lower extremity edema.   Abdomen: No tenderness, soft. Bowel sounds active.  Musculoskeletal: no clubbing / cyanosis. No joint deformity upper and lower extremities. Mild tenderness over sternum.   Skin: no significant rashes, lesions, ulcers. Warm, dry, well-perfused. Neurologic: CN 2-12 grossly intact. Moving all extremities. Alert and oriented.  Psychiatric: Pleasant. Cooperative.    Labs and Imaging on Admission: I have personally reviewed following labs and imaging studies  CBC: Recent Labs  Lab 01/02/24 1800  WBC 7.2  NEUTROABS 6.9  HGB 9.2*  HCT 28.6*  MCV 106.3*  PLT 232   Basic Metabolic Panel: Recent Labs  Lab 01/02/24 1800  NA 140  K 4.1  CL 106  CO2 20*  GLUCOSE 102*  BUN 24*  CREATININE 1.79*  CALCIUM  9.1   GFR: Estimated Creatinine Clearance: 25.9 mL/min (A) (by C-G formula based on SCr of 1.79 mg/dL (H)). Liver Function Tests: Recent Labs  Lab 01/02/24 1800  AST 29  ALT 30  ALKPHOS 67  BILITOT 1.2  PROT 6.6  ALBUMIN  4.0   No results for input(s): LIPASE, AMYLASE in the last 168 hours. No results for input(s): AMMONIA in the last 168 hours. Coagulation Profile: No results for input(s): INR, PROTIME in the last 168 hours. Cardiac Enzymes: No results for input(s): CKTOTAL, CKMB, CKMBINDEX, TROPONINI in the last 168 hours. BNP (last 3 results) Recent Labs    01/02/24 1800  PROBNP 9,018.0*    HbA1C: No results for input(s): HGBA1C in the  last 72 hours. CBG: No results for input(s): GLUCAP in the last 168 hours. Lipid Profile: No results for input(s): CHOL, HDL, LDLCALC, TRIG, CHOLHDL, LDLDIRECT in the last 72 hours. Thyroid  Function Tests: No results for input(s): TSH, T4TOTAL, FREET4, T3FREE, THYROIDAB in the last 72 hours. Anemia Panel: No results for input(s): VITAMINB12, FOLATE, FERRITIN, TIBC, IRON, RETICCTPCT in the last 72 hours. Urine analysis: No results found for: COLORURINE, APPEARANCEUR, LABSPEC, PHURINE, GLUCOSEU, HGBUR, BILIRUBINUR, KETONESUR, PROTEINUR, UROBILINOGEN, NITRITE, LEUKOCYTESUR Sepsis Labs: @LABRCNTIP (procalcitonin:4,lacticidven:4) )No results found for this or any previous visit (from the past 240 hours).   Radiological Exams on Admission: CT Angio Chest/Abd/Pel for Dissection W and/or Wo Contrast Result Date: 01/02/2024 CLINICAL DATA:  Chest and abdominal pain. Concern for aortic dissection. EXAM: CT ANGIOGRAPHY CHEST, ABDOMEN AND PELVIS TECHNIQUE: Non-contrast CT of the chest was initially obtained. Multidetector CT imaging through the chest, abdomen and pelvis was performed using the standard protocol during bolus administration of intravenous contrast. Multiplanar reconstructed images and MIPs were obtained and reviewed to evaluate the vascular anatomy. RADIATION DOSE REDUCTION: This exam was performed according to the departmental dose-optimization program which includes automated exposure control, adjustment of the mA and/or kV according to patient size and/or use of iterative reconstruction technique. CONTRAST:  80mL OMNIPAQUE  IOHEXOL  350 MG/ML SOLN COMPARISON:  Chest radiograph dated 01/02/2024. FINDINGS: CTA CHEST FINDINGS Cardiovascular: Top-normal cardiac size. There is coronary vascular calcification. Left pectoral pacemaker device. Small pericardial effusion measuring 6 mm in  thickness. Mild atherosclerotic calcification of the thoracic aorta. No aneurysmal dilatation or dissection. The origins of the great vessels of the aortic arch appear patent. No pulmonary artery embolus identified. Mediastinum/Nodes: No hilar or mediastinal adenopathy. The esophagus and the thyroid  gland are grossly unremarkable. No mediastinal fluid collection. Lungs/Pleura: Minimal bibasilar dependent atelectasis. No focal consolidation, pleural effusion, pneumothorax. The central airways are patent. Musculoskeletal: Osteopenia with degenerative changes of the spine. No acute osseous pathology. Review of the MIP images confirms the above findings. CTA ABDOMEN AND PELVIS FINDINGS VASCULAR Aorta: Mild atherosclerotic calcification. No aneurysmal dilatation or dissection. Celiac: The celiac trunk and its major branches are patent. SMA: The SMA is patent. Renals: The renal arteries are patent. IMA: The IMA is patent. Inflow: The iliac arteries are patent. No as well dilatation or dissection. Veins: No obvious venous abnormality within the limitations of this arterial phase study. Review of the MIP images confirms the above findings. NON-VASCULAR No intra-abdominal free air or free fluid. Hepatobiliary: There is irregularity of the liver contour suspicious for cirrhosis. No biliary ductal dilatation. Small cyst in the left lobe of the liver. No calcified gallstone or pericholecystic fluid. Pancreas: Unremarkable. No pancreatic ductal dilatation or surrounding inflammatory changes. Spleen: Normal in size without focal abnormality. Adrenals/Urinary Tract: The adrenal glands unremarkable. The kidneys, visualized ureters, and urinary bladder unremarkable. Stomach/Bowel: There is mild colonic diverticulosis. There is no bowel obstruction or active inflammation. The appendix is normal. Lymphatic: No adenopathy. Reproductive: Prostate brachytherapy seeds noted. Other: None Musculoskeletal: Osteopenia with degenerative  changes. No acute osseous pathology. Total left hip arthroplasty with associated streak artifact limiting evaluation of the pelvic structures. Review of the MIP images confirms the above findings. IMPRESSION: 1. No acute intrathoracic, abdominal, or pelvic pathology. No aortic aneurysm or dissection. 2. Mild colonic diverticulosis. No bowel obstruction. Normal appendix. 3.  Aortic Atherosclerosis (ICD10-I70.0). Electronically Signed   By: Vanetta Chou M.D.   On: 01/02/2024 19:45   DG Chest Port 1 View Result Date: 01/02/2024 CLINICAL DATA:  330 110 [id number]  Chest pain [id number] EXAM: PORTABLE CHEST - 1 VIEW COMPARISON:  August 26, 2023 FINDINGS: Lower lung volumes. Biapical pleural thickening. No focal airspace consolidation, pleural effusion, or pneumothorax. Mild cardiomegaly. Left chest pacemaker/AICD with leads terminating in the right atrium, right ventricle, and coronary sinus. tortuous aorta with aortic atherosclerosis. No acute fracture or destructive lesions. Multilevel thoracic osteophytosis. AC joint osteoarthritis. IMPRESSION: Low lung volumes.  Otherwise, no acute cardiopulmonary abnormality. Electronically Signed   By: Rogelia Myers M.D.   On: 01/02/2024 18:38    EKG: Independently reviewed. V-paced.   Assessment/Plan   1. Chest pain - Mild-moderate CAD on cath in 2017  - Pleuritic central chest pain, onset at rest, did not respond to NTG at home but relieved temporarily by NTG ointment and/or fentanyl  in ED - Troponin 24 -> 24; no acute findings on CTA though small pericardial effusion noted  - DDx includes pericarditis, musculoskeletal, GERD or esophageal spasm, or possibly UA - Plan to continue cardiac monitoring, trial GI cocktail, continue pain-control, check echocardiogram, and ask cardiology to evaluate in am   2. Chronic HFrEF  - EF was 35-40% with moderated-severe MR and mild-moderate TR in June 2025  - Appears compensated  - Continue Lasix  and losartan    3. Atrial fibrillation   - Continue Eliquis     4. PVCs; hx of NSVT  - Continue mexiletine   5. CKD 3B  - Appears close to baseline  - Renally-dose medications    6. Macrocytic anemia  - Hgb is 9.2, down from 10.5 in June  - Check anemia panel   7. Hx of CVA   - Continue Eliquis  and Crestor    8. RA  - Managed with methotrexate   - Continue Folate supplement     DVT prophylaxis: Eliquis   Code Status: Full  Level of Care: Level of care: Telemetry Family Communication: None present  Disposition Plan:  Patient is from: Home  Anticipated d/c is to: Home  Anticipated d/c date is: Possibly as early as 01/03/24  Patient currently: Pending cardiac monitoring, clinical course, echocardiogram   Consults called: Cardiology  Admission status: Observation     Evalene GORMAN Sprinkles, MD [facility]  01/02/2024, 11:04 PM

## 2024-01-03 ENCOUNTER — Encounter (HOSPITAL_COMMUNITY): Payer: Self-pay | Admitting: Family Medicine

## 2024-01-03 ENCOUNTER — Observation Stay (HOSPITAL_COMMUNITY)

## 2024-01-03 DIAGNOSIS — I5022 Chronic systolic (congestive) heart failure: Secondary | ICD-10-CM | POA: Diagnosis present

## 2024-01-03 DIAGNOSIS — I5020 Unspecified systolic (congestive) heart failure: Secondary | ICD-10-CM | POA: Diagnosis not present

## 2024-01-03 DIAGNOSIS — R040 Epistaxis: Secondary | ICD-10-CM | POA: Diagnosis present

## 2024-01-03 DIAGNOSIS — I4821 Permanent atrial fibrillation: Secondary | ICD-10-CM | POA: Diagnosis present

## 2024-01-03 DIAGNOSIS — I2510 Atherosclerotic heart disease of native coronary artery without angina pectoris: Secondary | ICD-10-CM | POA: Diagnosis present

## 2024-01-03 DIAGNOSIS — I130 Hypertensive heart and chronic kidney disease with heart failure and stage 1 through stage 4 chronic kidney disease, or unspecified chronic kidney disease: Secondary | ICD-10-CM | POA: Diagnosis present

## 2024-01-03 DIAGNOSIS — K3189 Other diseases of stomach and duodenum: Secondary | ICD-10-CM | POA: Diagnosis not present

## 2024-01-03 DIAGNOSIS — D509 Iron deficiency anemia, unspecified: Secondary | ICD-10-CM | POA: Diagnosis present

## 2024-01-03 DIAGNOSIS — Z681 Body mass index (BMI) 19 or less, adult: Secondary | ICD-10-CM | POA: Diagnosis not present

## 2024-01-03 DIAGNOSIS — I428 Other cardiomyopathies: Secondary | ICD-10-CM | POA: Diagnosis present

## 2024-01-03 DIAGNOSIS — I200 Unstable angina: Secondary | ICD-10-CM | POA: Diagnosis not present

## 2024-01-03 DIAGNOSIS — I7771 Dissection of carotid artery: Secondary | ICD-10-CM

## 2024-01-03 DIAGNOSIS — D696 Thrombocytopenia, unspecified: Secondary | ICD-10-CM | POA: Diagnosis present

## 2024-01-03 DIAGNOSIS — N1832 Chronic kidney disease, stage 3b: Secondary | ICD-10-CM | POA: Diagnosis present

## 2024-01-03 DIAGNOSIS — K219 Gastro-esophageal reflux disease without esophagitis: Secondary | ICD-10-CM | POA: Diagnosis present

## 2024-01-03 DIAGNOSIS — K7460 Unspecified cirrhosis of liver: Secondary | ICD-10-CM | POA: Diagnosis present

## 2024-01-03 DIAGNOSIS — K068 Other specified disorders of gingiva and edentulous alveolar ridge: Secondary | ICD-10-CM | POA: Diagnosis present

## 2024-01-03 DIAGNOSIS — E782 Mixed hyperlipidemia: Secondary | ICD-10-CM | POA: Diagnosis present

## 2024-01-03 DIAGNOSIS — K259 Gastric ulcer, unspecified as acute or chronic, without hemorrhage or perforation: Secondary | ICD-10-CM | POA: Diagnosis not present

## 2024-01-03 DIAGNOSIS — K254 Chronic or unspecified gastric ulcer with hemorrhage: Secondary | ICD-10-CM | POA: Diagnosis present

## 2024-01-03 DIAGNOSIS — I3139 Other pericardial effusion (noninflammatory): Secondary | ICD-10-CM | POA: Diagnosis present

## 2024-01-03 DIAGNOSIS — I340 Nonrheumatic mitral (valve) insufficiency: Secondary | ICD-10-CM | POA: Diagnosis present

## 2024-01-03 DIAGNOSIS — R7303 Prediabetes: Secondary | ICD-10-CM | POA: Diagnosis present

## 2024-01-03 DIAGNOSIS — R079 Chest pain, unspecified: Secondary | ICD-10-CM | POA: Diagnosis present

## 2024-01-03 DIAGNOSIS — E43 Unspecified severe protein-calorie malnutrition: Secondary | ICD-10-CM | POA: Diagnosis present

## 2024-01-03 DIAGNOSIS — M069 Rheumatoid arthritis, unspecified: Secondary | ICD-10-CM | POA: Diagnosis present

## 2024-01-03 DIAGNOSIS — K922 Gastrointestinal hemorrhage, unspecified: Secondary | ICD-10-CM | POA: Diagnosis not present

## 2024-01-03 DIAGNOSIS — Z95810 Presence of automatic (implantable) cardiac defibrillator: Secondary | ICD-10-CM

## 2024-01-03 DIAGNOSIS — D539 Nutritional anemia, unspecified: Secondary | ICD-10-CM | POA: Diagnosis present

## 2024-01-03 DIAGNOSIS — I4892 Unspecified atrial flutter: Secondary | ICD-10-CM

## 2024-01-03 DIAGNOSIS — I493 Ventricular premature depolarization: Secondary | ICD-10-CM

## 2024-01-03 DIAGNOSIS — E538 Deficiency of other specified B group vitamins: Secondary | ICD-10-CM | POA: Diagnosis present

## 2024-01-03 DIAGNOSIS — D649 Anemia, unspecified: Secondary | ICD-10-CM | POA: Diagnosis not present

## 2024-01-03 DIAGNOSIS — Z7901 Long term (current) use of anticoagulants: Secondary | ICD-10-CM

## 2024-01-03 DIAGNOSIS — K319 Disease of stomach and duodenum, unspecified: Secondary | ICD-10-CM | POA: Diagnosis present

## 2024-01-03 LAB — BASIC METABOLIC PANEL WITH GFR
Anion gap: 12 (ref 5–15)
BUN: 23 mg/dL (ref 8–23)
CO2: 20 mmol/L — ABNORMAL LOW (ref 22–32)
Calcium: 8.8 mg/dL — ABNORMAL LOW (ref 8.9–10.3)
Chloride: 107 mmol/L (ref 98–111)
Creatinine, Ser: 1.63 mg/dL — ABNORMAL HIGH (ref 0.61–1.24)
GFR, Estimated: 43 mL/min — ABNORMAL LOW (ref >60)
Glucose, Bld: 94 mg/dL (ref 70–99)
Potassium: 4.5 mmol/L (ref 3.5–5.1)
Sodium: 139 mmol/L (ref 135–145)

## 2024-01-03 LAB — ECHOCARDIOGRAM COMPLETE
AR max vel: 2.32 cm2
AV Area VTI: 2.2 cm2
AV Area mean vel: 2.05 cm2
AV Mean grad: 2 mmHg
AV Peak grad: 3.8 mmHg
Ao pk vel: 0.98 m/s
Area-P 1/2: 2.46 cm2
Calc EF: 56.4 %
Est EF: 55
Height: 68 in
MV M vel: 5.43 m/s
MV Peak grad: 118.1 mmHg
MV VTI: 1.8 cm2
Radius: 1 cm
S' Lateral: 3.4 cm
Single Plane A2C EF: 63.3 %
Single Plane A4C EF: 54.6 %
Weight: 1809.54 [oz_av]

## 2024-01-03 LAB — CBC
HCT: 25 % — ABNORMAL LOW (ref 39.0–52.0)
Hemoglobin: 8 g/dL — ABNORMAL LOW (ref 13.0–17.0)
MCH: 34.2 pg — ABNORMAL HIGH (ref 26.0–34.0)
MCHC: 32 g/dL (ref 30.0–36.0)
MCV: 106.8 fL — ABNORMAL HIGH (ref 80.0–100.0)
Platelets: 178 K/uL (ref 150–400)
RBC: 2.34 MIL/uL — ABNORMAL LOW (ref 4.22–5.81)
RDW: 16.1 % — ABNORMAL HIGH (ref 11.5–15.5)
WBC: 6.1 K/uL (ref 4.0–10.5)
nRBC: 0 % (ref 0.0–0.2)

## 2024-01-03 LAB — SEDIMENTATION RATE: Sed Rate: 69 mm/h — ABNORMAL HIGH (ref 0–20)

## 2024-01-03 LAB — RETICULOCYTES
Immature Retic Fract: 6.8 % (ref 2.3–15.9)
RBC.: 2.38 MIL/uL — ABNORMAL LOW (ref 4.22–5.81)
Retic Count, Absolute: 5.5 K/uL — ABNORMAL LOW (ref 19.0–186.0)
Retic Ct Pct: <0.4 % — ABNORMAL LOW (ref 0.4–3.1)

## 2024-01-03 LAB — FOLATE: Folate: 3.9 ng/mL — ABNORMAL LOW (ref >5.9)

## 2024-01-03 LAB — FERRITIN: Ferritin: 618 ng/mL — ABNORMAL HIGH (ref 24–336)

## 2024-01-03 LAB — IRON AND TIBC
Iron: 55 ug/dL (ref 45–182)
Saturation Ratios: 24 % (ref 17.9–39.5)
TIBC: 228 ug/dL — ABNORMAL LOW (ref 250–450)
UIBC: 173 ug/dL

## 2024-01-03 LAB — C-REACTIVE PROTEIN: CRP: 12.7 mg/dL — ABNORMAL HIGH (ref <1.0)

## 2024-01-03 LAB — VITAMIN B12: Vitamin B-12: 725 pg/mL (ref 180–914)

## 2024-01-03 LAB — HEPARIN LEVEL (UNFRACTIONATED): Heparin Unfractionated: >1.1 [IU]/mL — ABNORMAL HIGH (ref 0.30–0.70)

## 2024-01-03 MED ORDER — AZITHROMYCIN 250 MG PO TABS
500.0000 mg | ORAL_TABLET | Freq: Every day | ORAL | Status: DC
  Administered 2024-01-03 – 2024-01-06 (×4): 500 mg via ORAL
  Filled 2024-01-03 (×4): qty 2

## 2024-01-03 MED ORDER — SODIUM CHLORIDE 0.9 % WEIGHT BASED INFUSION
1.0000 mL/kg/h | INTRAVENOUS | Status: DC
  Administered 2024-01-04: 1 mL/kg/h via INTRAVENOUS

## 2024-01-03 MED ORDER — SODIUM CHLORIDE 0.9 % WEIGHT BASED INFUSION
3.0000 mL/kg/h | INTRAVENOUS | Status: DC
  Administered 2024-01-04: 3 mL/kg/h via INTRAVENOUS

## 2024-01-03 MED ORDER — METHOCARBAMOL 500 MG PO TABS
500.0000 mg | ORAL_TABLET | Freq: Three times a day (TID) | ORAL | Status: DC
  Administered 2024-01-03 – 2024-01-06 (×10): 500 mg via ORAL
  Filled 2024-01-03 (×10): qty 1

## 2024-01-03 MED ORDER — HEPARIN (PORCINE) 25000 UT/250ML-% IV SOLN
650.0000 [IU]/h | INTRAVENOUS | Status: DC
  Administered 2024-01-03: 750 [IU]/h via INTRAVENOUS
  Filled 2024-01-03: qty 250

## 2024-01-03 MED ORDER — APIXABAN 2.5 MG PO TABS: 2.5000 mg | ORAL_TABLET | Freq: Two times a day (BID) | ORAL | Status: DC

## 2024-01-03 MED ORDER — GUAIFENESIN-DM 100-10 MG/5ML PO SYRP
10.0000 mL | ORAL_SOLUTION | Freq: Three times a day (TID) | ORAL | Status: DC
  Administered 2024-01-03 – 2024-01-06 (×8): 10 mL via ORAL
  Filled 2024-01-03 (×8): qty 10

## 2024-01-03 NOTE — Progress Notes (Signed)
 Pt continues to c/o mild central chest heaviness worsened but deep breaths. Other than that, no acute events overnight. Kellogg RN

## 2024-01-03 NOTE — Consult Note (Addendum)
 Cardiology Consultation   Patient ID: Brent Ray. MRN: [id number]; DOB: 18-Oct-1945  Admit date: 01/02/2024 Date of Consult: 01/03/2024  PCP:  Meade Bigness, MD   [facility] Providers Cardiologist:  Vishnu P Mallipeddi, MD  Electrophysiologist:  Eulas FORBES Furbish, MD    Patient Profile: Brent Ray. is a 78 y.o. male with a hx of nonobstructive CAD (cath 2017), HFrEF (EF 35-40% in 08/2023), symptomatic bradycardia s/p dual-chamber pacemaker in 2017 with upgrade to CRT-D in 06/2021, permanent atrial fibrillation s/p ablation of Marshall bundle/mitral annular a flutter (on mexiletine), valvular abnormalities (ECHO 08/2023: moderate to severe MV regurgitation, mild to moderate TV regurgitation, trivial AV regurgitation), left subclavian vein thrombosis, history of GI bleeding, and CVA in 08/2023 who is being seen 01/03/2024 for the evaluation of chest pain at the request of Dr. Dianna.  History of Present Illness: Brent Ray was last seen in heart failure heart care OV 12/14/2023 by Greig Borrow, NP for follow-up.  Reported fatigue and mild SOB with exertion.  Noted symptoms related to URI.  Appeared euvolemic on exam.  Noted GDMT limited by drug intolerances.  Continued on Eliquis  2.5 mg daily, ASA 81 mg daily, losartan  12.5 mg daily, mexiletine 150 mg twice daily, NTG SL as needed, KCl 20 mEq daily, Crestor  5 mg once a week. Held Lasix  until follow-up BMP then resumed Lasix  20 mg daily. Follow up BNP improved from 1036 to 399.   Presented to [facility] ED 01/02/2024 for chest pain TN 24 x2, CR 1.79 (baseline Cr 1.8-2.4), Hgb 9.2, proBNP 9018, ESR elevated 69, CRP elevated 12.7 EKG: V-paced with HR 70's CXR showed no acute findings with low lung volumes.  CTA chest/abdomen/pelvic showed no aortic aneurysm/dissection, mild colonic diverticulosis, aortic atherosclerosis. ED physician consulted cardiology.  ED treated with LR, 1 inch NTG ointment, 324 mg ASA and IV  fentanyl .  On interview, patient is accompanied by wife who assists with history.  Patient reports constant chest pain since yesterday that started at 2 PM while sitting in a truck, described as tightening, 8/10, located in the midsternum radiating to right neck, worse with deep breaths and leaning forward, better with laying down.  Associated with SOB and dry heaving.  Tried NTG X1 with no relief.  Reports that chest pain improved with IV analgesics but is still present and less severe.  CP was reproducible on exam.  Also notes for the last month after physical therapy 3 times a week complaining of chest pain that was not as severe as this recent episode.  Reports chronic unchanged LE edema, orthopnea, palpitations.  Denies any dizziness or syncope.  Reports daily medication compliance.   Past Medical History:  Diagnosis Date   A-fib [facility])    AICD (automatic cardioverter/defibrillator) present    Barrett's esophagus 2011   In [place] . Records in Epic   CHF (congestive heart failure) (HCC)    Chronic kidney disease    Coronary atherosclerosis of native coronary artery    Nonobstructive   Essential hypertension, benign    GERD (gastroesophageal reflux disease)    Heart block AV first degree    Chronic   History of kidney stones    Hypercholesterolemia    Mixed hyperlipidemia    NSVT (nonsustained ventricular tachycardia) (HCC)    Holter monitor, 2011; recommended RF ablation by Dr. Kelsie - never required   PAF (paroxysmal atrial fibrillation) [facility])    Recently documented in [facility] June 2016   Pre-diabetes    Rheumatoid  arthritis (HCC)    Sinus bradycardia    Symptomatic, acebutolol discontinued   Skin cancer    Sleep apnea    pt denies    Past Surgical History:  Procedure Laterality Date   CARDIAC CATHETERIZATION N/A 12/13/2015   Procedure: Left Heart Cath and Coronary Angiography;  Surgeon: Dorn JINNY Lesches, MD;  Location: [facility] INVASIVE CV LAB;  Service: Cardiovascular;   Laterality: N/A;   CARDIOVERSION N/A 04/11/2016   Procedure: CARDIOVERSION;  Surgeon: Annabella Scarce, MD;  Location: [facility] ENDOSCOPY;  Service: Cardiovascular;  Laterality: N/A;   COLONOSCOPY  03/13/2012   [place], [place]--Dr. Spainhour   EP IMPLANTABLE DEVICE N/A 02/01/2016   Procedure: Pacemaker Implant;  Surgeon: Lynwood Rakers, MD;  Location: [facility] INVASIVE CV LAB;  Service: Cardiovascular;  Laterality: N/A;   ESOPHAGOGASTRODUODENOSCOPY  03/13/2012   [place], [place]--Dr. Spainhour   HERNIA REPAIR     2   IR ANGIO INTRA EXTRACRAN SEL COM CAROTID INNOMINATE BILAT MOD SED  08/27/2023   IR ANGIO VERTEBRAL SEL VERTEBRAL UNI L MOD SED  08/27/2023   KIDNEY STONE SURGERY     LEFT HEART CATHETERIZATION WITH CORONARY ANGIOGRAM N/A 08/20/2012   Procedure: LEFT HEART CATHETERIZATION WITH CORONARY ANGIOGRAM;  Surgeon: Lynwood Schilling, MD;  Location[facility] C[facility] LAB;  Service: Cardiovascular;  Laterality: N/A;   Left leg surgery  03/13/1977   RADIOLOGY WITH ANESTHESIA N/A 08/27/2023   Procedure: RADIOLOGY WITH ANESTHESIA;  Surgeon: Dolphus Carrion, MD;  Location: [facility] OR;  Service: Radiology;  Laterality: N/A;   SKIN CANCER EXCISION     TOTAL HIP ARTHROPLASTY Left 01/11/2022   Procedure: TOTAL HIP ARTHROPLASTY ANTERIOR APPROACH;  Surgeon: Fidel Rogue, MD;  Location: [facility] ORS;  Service: Orthopedics;  Laterality: Left;  150     Home Medications:  Prior to Admission medications   Medication Sig Start Date End Date Taking? Authorizing Provider  acetaminophen  (TYLENOL ) 325 MG tablet Take 650 mg by mouth as needed for mild pain (pain score 1-3).   Yes [provider]  apixaban  (ELIQUIS ) 2.5 MG TABS tablet Take 1 tablet (2.5 mg total) by mouth 2 (two) times daily. 08/29/23  Yes Odell Celinda Balo, MD  chlorhexidine  (PERIDEX ) 0.12 % solution Use as directed 5 mLs in the mouth or throat 2 (two) times daily. 12/03/23  Yes [provider]  CRANBERRY PO Take 500 mg by mouth daily.   Yes [provider]  folic acid  (FOLVITE ) 1 MG tablet Take 1 mg by mouth daily.  NOT on Friday d/t methotrexate  12/13/22  Yes [provider]  furosemide  (LASIX ) 20 MG tablet Take 1 tablet (20 mg total) by mouth daily. 08/21/23  Yes Sabharwal, Aditya, DO  ipratropium (ATROVENT) 0.06 % nasal spray Place 2 sprays into both nostrils daily as needed for rhinitis.   Yes [provider]  losartan  (COZAAR ) 25 MG tablet Take 0.5 tablets (12.5 mg total) by mouth daily. Patient taking differently: Take 12.5 mg by mouth in the morning and at bedtime. 09/25/23  Yes Mallipeddi, Vishnu P, MD  methotrexate  (RHEUMATREX) 2.5 MG tablet Take 2.5 mg by mouth every Friday. 6 pills weekly   Yes [provider]  mexiletine (MEXITIL ) 150 MG capsule Take 150 mg by mouth 2 (two) times daily.   Yes [provider]  NITROSTAT  0.4 MG SL tablet DISSOLVE 1 TABLET UNDER TONGUE EVERY 5 MINUTES AS NEEDED FOR CHEST PAIN Patient taking differently: Place 0.4 mg under the tongue every 5 (five) minutes as needed for chest pain. 05/11/14  Yes Debera Jayson MATSU, MD  ondansetron  (ZOFRAN ) 4 MG tablet Take 4 mg by mouth every 8 (eight) hours as needed for nausea. 01/15/23  Yes [provider]  pantoprazole  (PROTONIX ) 40 MG tablet Take 40 mg by mouth daily as needed (heartburn, acid reflux).   Yes [provider]  potassium chloride  SA (KLOR-CON  M) 20 MEQ tablet Take 20 mEq by mouth daily.   Yes [provider]  Probiotic Product (PROBIOTIC DAILY PO) Take 110 mg by mouth daily.   Yes [provider]  rosuvastatin  (CRESTOR ) 5 MG tablet Take 5 mg by mouth every Sunday.   Yes [provider]  sodium bicarbonate  650 MG tablet Take 650 mg by mouth daily.   Yes [provider]  traMADol (ULTRAM) 50 MG tablet Take 50 mg by mouth every 6 (six) hours as needed for moderate pain (pain score 4-6).   Yes [provider]    Scheduled Meds:  apixaban   2.5 mg Oral BID   folic  acid  1 mg Oral Daily   furosemide   20 mg Oral Daily   losartan   12.5 mg Oral QHS   mexiletine  150 mg Oral BID   pantoprazole   40 mg Oral Daily   [START ON 01/06/2024] rosuvastatin   5 mg Oral Q Sun   Continuous Infusions:  PRN Meds: acetaminophen , alum & mag hydroxide-simeth, ondansetron  (ZOFRAN ) IV, oxyCODONE   Allergies:    Allergies  Allergen Reactions   Amiodarone Other (See Comments)    Severe joint pain and blindness   Flecainide  Other (See Comments)    Severe headaches, blurred vision   Tamsulosin Hives and Itching   Tamsulosin Hcl Hives, Itching, Dermatitis and Rash   Carvedilol  Other (See Comments)    Unknown   Dapagliflozin Diarrhea   Eplerenone Other (See Comments)    Unknown   Lisinopril  Nausea And Vomiting and Nausea Only   Metoprolol  Other (See Comments)    Headaches, dizziness   Sacubitril-Valsartan Nausea And Vomiting and Nausea Only   Spironolactone  Swelling and Other (See Comments)    Breast swelling, headaches   Statins Other (See Comments)    Leg cramps   Sulfasalazine Nausea Only    Social History:   Social History   Socioeconomic History   Marital status: Married    Spouse name: Not on file   Number of children: 1   Years of education: Not on file   Highest education level: Not on file  Occupational History   Occupation: [employer]    Comment: Quarry manager  Tobacco Use   Smoking status: Never    Passive exposure: Never   Smokeless tobacco: Never  Vaping Use   Vaping status: Never Used  Substance and Sexual Activity   Alcohol  use: Yes    Alcohol /week: 2.0 standard drinks of alcohol     Types: 2 Cans of beer per week    Comment: Occasional beer   Drug use: No   Sexual activity: Yes    Partners: Female  Other Topics Concern   Not on file  Social History Narrative   Daily caffeine    Family History:   Family History  Problem Relation Age of Onset   Colon cancer Mother    Other Sister         AGE-3-HEALTHY   Other  Sister        AGE 20-HEALTHY   Other Sister        AGE 69 HEALTHY   Other Son  AGE 80 HEALTHY     ROS:  Please see the history of present illness.  All other ROS reviewed and negative.     Physical Exam/Data: Vitals:   01/03/24 0027 01/03/24 0200 01/03/24 0346 01/03/24 0446  BP: (!) 143/65  (!) 141/87   Pulse:      Resp: 14 15    Temp: 98 F (36.7 C)  97.9 F (36.6 C)   TempSrc: Oral  Oral   SpO2: 100%  100%   Weight: 51.4 kg   51.3 kg  Height: 5' 8 (1.727 m)       Intake/Output Summary (Last 24 hours) at 01/03/2024 0930 Last data filed at 01/03/2024 0345 Gross per 24 hour  Intake 500 ml  Output 200 ml  Net 300 ml      01/03/2024    4:46 AM 01/03/2024   12:27 AM 01/02/2024    5:12 PM  Last 3 Weights  Weight (lbs) 113 lb 1.5 oz 113 lb 4.8 oz 115 lb  Weight (kg) 51.3 kg 51.393 kg 52.164 kg     Body mass index is 17.2 kg/m.  General:  Well nourished, well developed, in no acute distress; accompanied by wife HEENT: normal Neck: no JVD Vascular: No carotid bruits; Distal pulses 2+ bilaterally Cardiac:  normal S1, S2; RRR; no murmur  Lungs:  clear to auscultation bilaterally, no wheezing, rhonchi or rales  Abd: soft, nontender, no hepatomegaly  Ext: no edema Musculoskeletal:  No deformities, BUE and BLE strength normal and equal Skin: warm and dry  Neuro:  CNs 2-12 intact, no focal abnormalities noted Psych:  Normal affect   EKG:  The EKG was personally reviewed and demonstrates:  V-paced with HR in 70's w/ baseline artifact with no significant changes Telemetry:  Telemetry was personally reviewed and demonstrates:  V-paced, HR 7's with occasional PVC's   Relevant CV Studies: ECHO pending   Cath 2017 Ost RCA to Prox RCA lesion, 40 %stenosed. Ost 1st Diag to 1st Diag lesion, 60 %stenosed. 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 LV end diastolic pressure is moderately elevated. The left ventricular ejection fraction is less than 25%  by visual estimate.  ECHO IMPRESSIONS 08/2023  1. Left ventricular ejection fraction, by estimation, is 35 to 40%. The  left ventricle has moderately decreased function. The left ventricle  demonstrates regional wall motion abnormalities (see scoring  diagram/findings for description). There is mild  concentric left ventricular hypertrophy. Left ventricular diastolic  parameters are indeterminate.   2. Right ventricular systolic function is normal. The right ventricular  size is normal. There is mildly elevated pulmonary artery systolic  pressure. The estimated right ventricular systolic pressure is 38.0 mmHg.   3. Left atrial size was moderately dilated.   4. The mitral valve is degenerative with mild posterior leaflet prolapse.  Moderate to severe mitral valve regurgitation.   5. Tricuspid valve regurgitation is mild to moderate.   6. The aortic valve is tricuspid. There is mild calcification of the  aortic valve. Aortic valve regurgitation is trivial. No aortic stenosis is  present.   7. The inferior vena cava is normal in size with greater than 50%  respiratory variability, suggesting right atrial pressure of 3 mmHg.   8. Evidence of probable atrial level shunting detected by color flow  Doppler, left to right at mid interatrial septum. Suggest agitated saline  study.   Comparison(s): Prior images reviewed side by side. LVEF 35-40% with stable  WMA and septal motion consistent with RV  pacing. Mild posterior leaflet  mitral prolapse with moderate to severe mtiral regurgitation. 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    Laboratory Data: High Sensitivity Troponin:  No results for input(s): TROPONINIHS in the last 720 hours.   Chemistry Recent Labs  Lab 01/02/24 1800 01/03/24 0341  NA 140 139  K 4.1 4.5  CL 106 107  CO2 20* 20*  GLUCOSE 102* 94  BUN 24* 23  CREATININE 1.79* 1.63*  CALCIUM  9.1 8.8*  GFRNONAA 39* 43*  ANIONGAP 14 12     Recent Labs  Lab 01/02/24 1800  PROT 6.6  ALBUMIN  4.0  AST 29  ALT 30  ALKPHOS 67  BILITOT 1.2   Lipids No results for input(s): CHOL, TRIG, HDL, LABVLDL, LDLCALC, CHOLHDL in the last 168 hours.  Hematology Recent Labs  Lab 01/02/24 1800 01/03/24 0341  WBC 7.2 6.1  RBC 2.69* 2.34*  2.38*  HGB 9.2* 8.0*  HCT 28.6* 25.0*  MCV 106.3* 106.8*  MCH 34.2* 34.2*  MCHC 32.2 32.0  RDW 16.2* 16.1*  PLT 232 178   Thyroid  No results for input(s): TSH, FREET4 in the last 168 hours.  BNP Recent Labs  Lab 01/02/24 1800  PROBNP 9,018.0*    DDimer No results for input(s): DDIMER in the last 168 hours.  Radiology/Studies:  CT Angio Chest/Abd/Pel for Dissection W and/or Wo Contrast Result Date: 01/02/2024 CLINICAL DATA:  Chest and abdominal pain. Concern for aortic dissection. EXAM: CT ANGIOGRAPHY CHEST, ABDOMEN AND PELVIS TECHNIQUE: Non-contrast CT of the chest was initially obtained. Multidetector CT imaging through the chest, abdomen and pelvis was performed using the standard protocol during bolus administration of intravenous contrast. Multiplanar reconstructed images and MIPs were obtained and reviewed to evaluate the vascular anatomy. RADIATION DOSE REDUCTION: This exam was performed according to the departmental dose-optimization program which includes automated exposure control, adjustment of the mA and/or kV according to patient size and/or use of iterative reconstruction technique. CONTRAST:  80mL OMNIPAQUE  IOHEXOL  350 MG/ML SOLN COMPARISON:  Chest radiograph dated 01/02/2024. FINDINGS: CTA CHEST FINDINGS Cardiovascular: Top-normal cardiac size. There is coronary vascular calcification. Left pectoral pacemaker device. Small pericardial effusion measuring 6 mm in thickness. Mild atherosclerotic calcification of the thoracic aorta. No aneurysmal dilatation or dissection. The origins of the great vessels of the aortic arch appear patent. No pulmonary artery embolus  identified. Mediastinum/Nodes: No hilar or mediastinal adenopathy. The esophagus and the thyroid  gland are grossly unremarkable. No mediastinal fluid collection. Lungs/Pleura: Minimal bibasilar dependent atelectasis. No focal consolidation, pleural effusion, pneumothorax. The central airways are patent. Musculoskeletal: Osteopenia with degenerative changes of the spine. No acute osseous pathology. Review of the MIP images confirms the above findings. CTA ABDOMEN AND PELVIS FINDINGS VASCULAR Aorta: Mild atherosclerotic calcification. No aneurysmal dilatation or dissection. Celiac: The celiac trunk and its major branches are patent. SMA: The SMA is patent. Renals: The renal arteries are patent. IMA: The IMA is patent. Inflow: The iliac arteries are patent. No as well dilatation or dissection. Veins: No obvious venous abnormality within the limitations of this arterial phase study. Review of the MIP images confirms the above findings. NON-VASCULAR No intra-abdominal free air or free fluid. Hepatobiliary: There is irregularity of the liver contour suspicious for cirrhosis. No biliary ductal dilatation. Small cyst in the left lobe of the liver. No calcified gallstone or pericholecystic fluid. Pancreas: Unremarkable. No pancreatic ductal dilatation or surrounding inflammatory changes. Spleen: Normal in size without focal abnormality. Adrenals/Urinary Tract: The adrenal glands  unremarkable. The kidneys, visualized ureters, and urinary bladder unremarkable. Stomach/Bowel: There is mild colonic diverticulosis. There is no bowel obstruction or active inflammation. The appendix is normal. Lymphatic: No adenopathy. Reproductive: Prostate brachytherapy seeds noted. Other: None Musculoskeletal: Osteopenia with degenerative changes. No acute osseous pathology. Total left hip arthroplasty with associated streak artifact limiting evaluation of the pelvic structures. Review of the MIP images confirms the above findings. IMPRESSION:  1. No acute intrathoracic, abdominal, or pelvic pathology. No aortic aneurysm or dissection. 2. Mild colonic diverticulosis. No bowel obstruction. Normal appendix. 3.  Aortic Atherosclerosis (ICD10-I70.0). Electronically Signed   By: Vanetta Chou M.D.   On: 01/02/2024 19:45   DG Chest Port 1 View Result Date: 01/02/2024 CLINICAL DATA:  355200 Chest pain 355200 EXAM: PORTABLE CHEST - 1 VIEW COMPARISON:  August 26, 2023 FINDINGS: Lower lung volumes. Biapical pleural thickening. No focal airspace consolidation, pleural effusion, or pneumothorax. Mild cardiomegaly. Left chest pacemaker/AICD with leads terminating in the right atrium, right ventricle, and coronary sinus. tortuous aorta with aortic atherosclerosis. No acute fracture or destructive lesions. Multilevel thoracic osteophytosis. AC joint osteoarthritis. IMPRESSION: Low lung volumes.  Otherwise, no acute cardiopulmonary abnormality. Electronically Signed   By: Rogelia Myers M.D.   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  ECHO results pending. proBNP 9018, CXR w/ no signs of volume overload Reports chronic unchanged SOB, LE edema, orthopnea.  Appears euvolemic on exam.  No need for IV diuresis at this time. Continue losartan  12.5 mg daily, Lasix  20 mg daily GDMT limited by drug intolerances to aldosterone antagonist, beta-blockers, digoxin, hydralazine /nitrates, SGLT2i Per heart failure note, not a candidate for advanced therapies Per VM, schedule for [facility] tomorrow.   Typical vs Atypical CP Nonobstructive CAD HLD Exercise Myoview  2015: normal, low risk  Cath  2017: 40% ostial to proximal RCA, Ostial 1st diag to 1st diag 60 % Reports constant CP that occurred at rest described as tightness, 8/10 that is worse with deep breaths and leaning forward and better with laying down.  Associated with shortness of breath and dry heaving.  No relief with NTG.  CP improved with IV analgesics but is still present and less severe. On exam, chest pain reproducible with palpation. Also notes exertional chest pain with physical therapy over the last 1 month.  Most recent CP seems c/w atypical CP and most likely MSK, however exertional CP over the last month is concerning for cardiac cause.  EKG: V-paced with HR in 70's w/ baseline artifact with no significant changes TN 24 x2  ESR elevated 69, CRP elevated 12.7. Opposite classical finding of CP associated with pericarditis. Can reassess based on ECHO.  Per VM, schedule for [facility] tomorrow.  Continue losartan  12.5 mg, Crestor  5 mg Can consider Ranexa 500 mg BID as antianginal. Would avoid amlodipine  with hx of LE edema.   History of symptomatic bradycardia, heart block s/p dual-chamber pacemaker in 2017 with upgrade to CRT-D in 06/2021 ICD device interrogation 11/2023 showed biventricular pacing with 3 VR H episodes with longest 11 beats, normal device function  Permanent atrial fibrillation S/p multiple ablations Continue on Eliquis  2.5 mg twice daily and mexiletine 150 mg twice  daily  CKD CR 1.79 (baseline Cr 1.8-2.4) Continue to monitor  Valvular abnormalities  ECHO 08/2023: moderate to severe MV regurgitation, mild to moderate TV regurgitation, trivial AV regurgitation ECHO pending.   Risk Assessment/Risk Scores:  [place]  Heart Association (NYHA) Functional Class NYHA Class III  CHA2DS2-VASc Score = 5   This indicates a 7.2% annual risk of stroke. The patient's score is based upon: CHF History: 1 HTN History: 1 Diabetes History: 0 Stroke History: 0 Vascular Disease History: 1 Age Score: 2 Gender  Score: 0   For questions or updates, please contact [facility] Please consult [email or web address] for contact info under      Signed, Lorette CINDERELLA Kapur, PA-C  01/03/2024 9:30 AM

## 2024-01-03 NOTE — Progress Notes (Signed)
 PROGRESS NOTE    Patient: Brent Ray.                            PCP: Meade Bigness, MD                    DOB: 11-10-1945            DOA: 01/02/2024 FMW:[id number]             DOS: 01/03/2024, 7:49 AM   LOS: 0 days   Date of Service: The patient was seen and examined on 01/03/2024  Subjective:   The patient was seen and examined this morning. Still complaining of chest pain at rest but mild Worsening chest pain with exertion, associate with cough. Brief nosebleed this morning.    Brief Narrative:    Brent Ray. is a 78 y.o. 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   Patient reports that he was in his usual state and sitting in his truck this afternoon when he developed pain in his central chest.  Pain occurs when he takes a deep breath and is associated with discomfort in his neck/throat.  He feels short of breath when experiencing this pain.  He took a nitroglycerin  at home without relief.    ED Course: Afebrile and saturating well on room air with normal HR and elevated BP.  Labs are most notable for creatinine 1.79, hemoglobin 9.2, troponin 24 x 2, and proBNP 9018.  CTA of the chest, abdomen, and pelvis is negative for acute findings though notable for small pericardial effusion.   Cardiology was consulted by the ED physician and the patient was given 500 mL of LR, 1 inch nitroglycerin  ointment, 324 mg aspirin , and fentanyl .     Assessment & Plan:   Principal Problem:   Chest pain Active Problems:   Essential hypertension, benign   Rheumatoid arthritis (HCC)   CAD in native artery   Persistent atrial fibrillation (HCC)   Chronic systolic CHF (congestive heart  failure) (HCC)   CKD (chronic kidney disease) stage 3, GFR 30-59 ml/min (HCC)   History of stroke   Macrocytic anemia   Chest pain -Still complaining of chest pain at rest, worsening with minimal exertion and cough and deep breathing - Troponin 24 -> 24;  - No acute findings on CTA though small pericardial effusion noted   - Echocardiogram-reviewed by cardiology  Per cardiology: Transfer to [facility] for LHC and RHC tomorrow afternoon. On Eliquis , need to hold. Chronic systolic HF with accerelating angina and 20% PVC burden.  - Prior cath mild CAD in 2017 LM hey Dalia has got heart rate good    Chronic HFrEF  - EF was 35-40% with moderated-severe MR and mild-moderate TR in June 2025  - Appears compensated  - Continue Lasix  and losartan     Atrial fibrillation  - Holding Eliquis   -per cardiology request and epistaxis  Epistaxis -Brief nosebleed this morning has stopped spontaneously -On Eliquis -on hold -Mild drop in hemoglobin noted, will monitor closely    PVCs; hx of NSVT  - Continue mexiletine    CKD 3B  - Appears close to baseline  - Renally-dose medications  Macrocytic anemia  - Hgb is 9.2, down from 10.5 in June  - Check anemia panel     Latest Ref Rng & Units 01/03/2024    3:41 AM 01/02/2024    6:00 PM 08/29/2023    5:19 AM  CBC  WBC 4.0 - 10.5 K/uL 6.1  7.2  6.5   Hemoglobin 13.0 - 17.0 g/dL 8.0  9.2  89.4   Hematocrit 39.0 - 52.0 % 25.0  28.6  31.4   Platelets 150 - 400 K/uL 178  232  243       Hx of CVA   - Continue Eliquis  and Crestor     RA  - Managed with methotrexate   - Continue Folate supplement   ----------------------------------------------------------------------------------------------------------------------------------------------- Nutritional status:  The patient's BMI is: Body mass index is 17.2 kg/m. I agree with the assessment and plan as outlined    ------------------------------------------------------------------------------------------------------------------------------------------------  DVT prophylaxis:  apixaban  (ELIQUIS ) tablet 2.5 mg Start: 01/02/24 2315 apixaban  (ELIQUIS ) tablet 2.5 mg   Code Status:   Code Status: Full Code  Family Communication: No family member present at bedside-  -Advance care planning has been discussed.   Admission status:   Status is: Observation The patient remains OBS appropriate and will d/c before 2 midnights.   Disposition: From  - home             Planning for discharge in 1-2 days   Procedures:   No admission procedures for hospital encounter.   Antimicrobials:  Anti-infectives (From admission, onward)    None        Medication:   apixaban   2.5 mg Oral BID   folic acid   1 mg Oral Daily   furosemide   20 mg Oral Daily   losartan   12.5 mg Oral QHS   mexiletine  150 mg Oral BID   pantoprazole   40 mg Oral Daily   [START ON 01/06/2024] rosuvastatin   5 mg Oral Q Sun    acetaminophen , alum & mag hydroxide-simeth, ondansetron  (ZOFRAN ) IV, oxyCODONE    Objective:   Vitals:   01/03/24 0027 01/03/24 0200 01/03/24 0346 01/03/24 0446  BP: (!) 143/65  (!) 141/87   Pulse:      Resp: 14 15    Temp: 98 F (36.7 C)  97.9 F (36.6 C)   TempSrc: Oral  Oral   SpO2: 100%  100%   Weight: 51.4 kg   51.3 kg  Height: 5' 8 (1.727 m)       Intake/Output Summary (Last 24 hours) at 01/03/2024 0749 Last data filed at 01/03/2024 0345 Gross per 24 hour  Intake 500 ml  Output 200 ml  Net 300 ml   Filed Weights   01/02/24 1712 01/03/24 0027 01/03/24 0446  Weight: 52.2 kg 51.4 kg 51.3 kg     Physical examination:   General:  AAO x 3,  cooperative, no distress;   HEENT:  Normocephalic, PERRL, otherwise with in Normal limits   Neuro:  CNII-XII intact. , normal motor and sensation, reflexes intact   Lungs:   Clear to auscultation BL, Respirations unlabored,  No wheezes /  crackles  Cardio:    S1/S2, RRR, No murmure, No Rubs or Gallops   Abdomen:  Soft, non-tender, bowel sounds active all four quadrants, no guarding or peritoneal signs.  Muscular  skeletal:  Limited exam -global generalized weaknesses - in bed, able to move all 4 extremities,   2+ pulses,  symmetric, No pitting edema  Skin:  Dry, warm to touch, negative for any  Rashes,  Wounds: Please see nursing documentation       ------------------------------------------------------------------------------------------------------------------------------------------    LABs:     Latest Ref Rng & Units 01/03/2024    3:41 AM 01/02/2024    6:00 PM 08/29/2023    5:19 AM  CBC  WBC 4.0 - 10.5 K/uL 6.1  7.2  6.5   Hemoglobin 13.0 - 17.0 g/dL 8.0  9.2  89.4   Hematocrit 39.0 - 52.0 % 25.0  28.6  31.4   Platelets 150 - 400 K/uL 178  232  243       Latest Ref Rng & Units 01/03/2024    3:41 AM 01/02/2024    6:00 PM 12/14/2023   11:00 AM  CMP  Glucose 70 - 99 mg/dL 94  897  97   BUN 8 - 23 mg/dL 23  24  33   Creatinine 0.61 - 1.24 mg/dL 8.36  8.20  8.30   Sodium 135 - 145 mmol/L 139  140  138   Potassium 3.5 - 5.1 mmol/L 4.5  4.1  3.5   Chloride 98 - 111 mmol/L 107  106  107   CO2 22 - 32 mmol/L 20  20  19    Calcium  8.9 - 10.3 mg/dL 8.8  9.1  9.1   Total Protein 6.5 - 8.1 g/dL  6.6    Total Bilirubin 0.0 - 1.2 mg/dL  1.2    Alkaline Phos 38 - 126 U/L  67    AST 15 - 41 U/L  29    ALT 0 - 44 U/L  30         Micro Results No results found for this or any previous visit (from the past 240 hours).  Radiology Reports CT Angio Chest/Abd/Pel for Dissection W and/or Wo Contrast Result Date: 01/02/2024 CLINICAL DATA:  Chest and abdominal pain. Concern for aortic dissection. EXAM: CT ANGIOGRAPHY CHEST, ABDOMEN AND PELVIS TECHNIQUE: Non-contrast CT of the chest was initially obtained. Multidetector CT imaging through the chest, abdomen and pelvis was performed using the standard protocol during  bolus administration of intravenous contrast. Multiplanar reconstructed images and MIPs were obtained and reviewed to evaluate the vascular anatomy. RADIATION DOSE REDUCTION: This exam was performed according to the departmental dose-optimization program which includes automated exposure control, adjustment of the mA and/or kV according to patient size and/or use of iterative reconstruction technique. CONTRAST:  80mL OMNIPAQUE  IOHEXOL  350 MG/ML SOLN COMPARISON:  Chest radiograph dated 01/02/2024. FINDINGS: CTA CHEST FINDINGS Cardiovascular: Top-normal cardiac size. There is coronary vascular calcification. Left pectoral pacemaker device. Small pericardial effusion measuring 6 mm in thickness. Mild atherosclerotic calcification of the thoracic aorta. No aneurysmal dilatation or dissection. The origins of the great vessels of the aortic arch appear patent. No pulmonary artery embolus identified. Mediastinum/Nodes: No hilar or mediastinal adenopathy. The esophagus and the thyroid  gland are grossly unremarkable. No mediastinal fluid collection. Lungs/Pleura: Minimal bibasilar dependent atelectasis. No focal consolidation, pleural effusion, pneumothorax. The central airways are patent. Musculoskeletal: Osteopenia with degenerative changes of the spine. No acute osseous pathology. Review of the MIP images confirms the above findings. CTA ABDOMEN AND PELVIS FINDINGS VASCULAR Aorta: Mild atherosclerotic calcification. No aneurysmal dilatation or dissection. Celiac: The celiac trunk and its major branches are patent. SMA: The SMA is patent. Renals: The renal arteries are patent. IMA: The IMA is patent. Inflow: The iliac arteries are patent. No as well dilatation or dissection. Veins: No obvious venous abnormality within the limitations of this arterial phase study. Review of the  MIP images confirms the above findings. NON-VASCULAR No intra-abdominal free air or free fluid. Hepatobiliary: There is irregularity of the liver  contour suspicious for cirrhosis. No biliary ductal dilatation. Small cyst in the left lobe of the liver. No calcified gallstone or pericholecystic fluid. Pancreas: Unremarkable. No pancreatic ductal dilatation or surrounding inflammatory changes. Spleen: Normal in size without focal abnormality. Adrenals/Urinary Tract: The adrenal glands unremarkable. The kidneys, visualized ureters, and urinary bladder unremarkable. Stomach/Bowel: There is mild colonic diverticulosis. There is no bowel obstruction or active inflammation. The appendix is normal. Lymphatic: No adenopathy. Reproductive: Prostate brachytherapy seeds noted. Other: None Musculoskeletal: Osteopenia with degenerative changes. No acute osseous pathology. Total left hip arthroplasty with associated streak artifact limiting evaluation of the pelvic structures. Review of the MIP images confirms the above findings. IMPRESSION: 1. No acute intrathoracic, abdominal, or pelvic pathology. No aortic aneurysm or dissection. 2. Mild colonic diverticulosis. No bowel obstruction. Normal appendix. 3.  Aortic Atherosclerosis (ICD10-I70.0). Electronically Signed   By: Vanetta Chou M.D.   On: 01/02/2024 19:45   DG Chest Port 1 View Result Date: 01/02/2024 CLINICAL DATA:  355200 Chest pain 355200 EXAM: PORTABLE CHEST - 1 VIEW COMPARISON:  August 26, 2023 FINDINGS: Lower lung volumes. Biapical pleural thickening. No focal airspace consolidation, pleural effusion, or pneumothorax. Mild cardiomegaly. Left chest pacemaker/AICD with leads terminating in the right atrium, right ventricle, and coronary sinus. tortuous aorta with aortic atherosclerosis. No acute fracture or destructive lesions. Multilevel thoracic osteophytosis. AC joint osteoarthritis. IMPRESSION: Low lung volumes.  Otherwise, no acute cardiopulmonary abnormality. Electronically Signed   By: Rogelia Myers M.D.   On: 01/02/2024 18:38    SIGNED: Adriana DELENA Grams, MD, FHM. FAAFP. [facility] - [facility] Time spent - 55 min.  In seeing, evaluating and examining the patient. Reviewing medical records, labs, drawn plan of care. [facility],  Pager (please use [email or web address] to page/ text) Please use Epic Secure Chat for non-urgent communication (7AM-7PM)  If 7PM-7AM, please contact night-coverage [email or web address], 01/03/2024, 7:49 AM

## 2024-01-03 NOTE — Progress Notes (Signed)
   01/03/24 1108  TOC Brief Assessment  Insurance and Status Reviewed  Patient has primary care physician Yes  Home environment has been reviewed Home with spouse  Prior level of function: independent  Prior/Current Home Services No current home services  Social Drivers of Health Review SDOH reviewed no interventions necessary  Readmission risk has been reviewed Yes  Transition of care needs no transition of care needs at this time   Inpatient Care Manager (ICM) has reviewed patient and no ICM needs have been identified at this time. We will continue to monitor patient advancement through interdisciplinary progression rounds. If new patient transition needs arise, please place a ICM consult.

## 2024-01-03 NOTE — Progress Notes (Signed)
 PHARMACY - ANTICOAGULATION CONSULT NOTE  Pharmacy Consult for heparin  Indication: chest pain/ACS  Allergies  Allergen Reactions   Amiodarone Other (See Comments)    Severe joint pain and blindness   Flecainide  Other (See Comments)    Severe headaches, blurred vision   Tamsulosin Hives and Itching   Tamsulosin Hcl Hives, Itching, Dermatitis and Rash   Carvedilol  Other (See Comments)    Unknown   Dapagliflozin Diarrhea   Eplerenone Other (See Comments)    Unknown   Lisinopril  Nausea And Vomiting and Nausea Only   Metoprolol  Other (See Comments)    Headaches, dizziness   Sacubitril-Valsartan Nausea And Vomiting and Nausea Only   Spironolactone  Swelling and Other (See Comments)    Breast swelling, headaches   Statins Other (See Comments)    Leg cramps   Sulfasalazine Nausea Only    Patient Measurements: Height: 5' 8 (172.7 cm) Weight: 51.3 kg (113 lb 1.5 oz) IBW/kg (Calculated) : 68.4 HEPARIN  DW (KG): 51.4  Vital Signs: Temp: 97.9 F (36.6 C) (10/23 0346) Temp Source: Oral (10/23 0346) BP: 141/87 (10/23 0346) Pulse Rate: 69 (10/23 0000)  Labs: Recent Labs    01/02/24 1800 01/03/24 0341  HGB 9.2* 8.0*  HCT 28.6* 25.0*  PLT 232 178  CREATININE 1.79* 1.63*    Estimated Creatinine Clearance: 28 mL/min (A) (by C-G formula based on SCr of 1.63 mg/dL (H)).   Medical History: Past Medical History:  Diagnosis Date   A-fib [facility])    AICD (automatic cardioverter/defibrillator) present    Barrett's esophagus 2011   In [place] . Records in Epic   CHF (congestive heart failure) (HCC)    Chronic kidney disease    Coronary atherosclerosis of native coronary artery    Nonobstructive   Essential hypertension, benign    GERD (gastroesophageal reflux disease)    Heart block AV first degree    Chronic   History of kidney stones    Hypercholesterolemia    Mixed hyperlipidemia    NSVT (nonsustained ventricular tachycardia) (HCC)    Holter monitor, 2011; recommended RF  ablation by Dr. Kelsie - never required   PAF (paroxysmal atrial fibrillation) [facility])    Recently documented in [facility] June 2016   Pre-diabetes    Rheumatoid arthritis (HCC)    Sinus bradycardia    Symptomatic, acebutolol discontinued   Skin cancer    Sleep apnea    pt denies   Assessment: 78 year old male with known CAD presents to [facility] with chest pain. Patient with hsitory of afib on chronic apixaban . Last dose appears to be last night 10/22 pm. Apixaban  held this morning, new orders to transition to heparin  infusion with plans for possible cath.   Goal of Therapy:  Heparin  level 0.3-0.7 units/ml Monitor platelets by anticoagulation protocol: Yes   Plan:  Start heparin  infusion at 750 units/hr Check anti-Xa level in 8 hours and daily while on heparin  Continue to monitor H&H and platelets  Dempsey Blush PharmD., BCPS Clinical Pharmacist 01/03/2024 11:52 AM

## 2024-01-03 NOTE — Hospital Course (Addendum)
 Brent Ray. is a 78 y.o. 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  Hospital with chest pain with some pleuritic component and shortness of breath.  He took nitroglycerin  without any relief.  In the ED patient was noted to have creatinine elevation at 1.7 troponins were 24 followed by 24 with BNP of 9018.    CTA of the chest, abdomen, and pelvis was negative for acute findings though notable for small pericardial effusion.  Cardiology was consulted from the ED and patient was admitted hospital for further evaluation and treatment.  Chest pain Thought process was chronic systolic heart failure with unstable angina with 20% PVC burden.  Patient did have previous catheterization in 2017 with mild CAD.  No further episode of chest pain after admission..  Eliquis  on hold.  Patient was initially on heparin  drip.  Troponins were 24 followed by 24.  CTA of the chest showed pericardial effusion but no other findings.  2D echocardiogram showed LV ejection fraction of 55%.  Initial plan was to transfer to [facility] for left heart and right heart catheterization but due to bleeding currently on hold.    Macrocytic anemia -with anemia of iron deficiency, folate deficiency Hemoglobin down up to 7.1 during hospitalization from 10.5 in June 2025.  Received 2 units of packed RBC.  GI was consulted and underwent upper GI endoscopy on 01/04/2024 with findings of oral cavity bleeding gastropathy.  At this time patient is off Eliquis .  Recommendation is against NSAIDs.  Patient will continue PPI twice daily and Carafate 3 times daily.  Anemia workup done showed total iron 26, TIBC 185, percent sat 14, ferritin 794, folate less than 3.0.  Continue folate  supplementation.  Received IV iron infusion and 2 units of packed RBC.  Hemoglobin today at 12.4.  Will need to repeat for accuracy.    Chronic HFrEF  Last known 2D echocardiogram with LV EF of 35-40% with moderated-severe MR and mild-moderate TR in June 2025.  Currently compensated.  Continue Lasix  and losartan     Paroxysmal atrial fibrillation  Eliquis  on hold.  GI does not recommend anticoagulation until next GI visit.   Epistaxis Resolved.  Eliquis  on hold.  PVCs. - Continue mexiletine    CKD 3B  Likely at baseline.  Latest creatinine of 1.6.  Will continue to monitor.    Hx of CVA   Patient is on Eliquis  and Crestor  as outpatient.  Currently off Eliquis  and GI recommends against it until next GI visit due to high risk of bleeding.   Rheumatoid arthritis. Managed with methotrexate .  Continue folate supplementation.

## 2024-01-04 ENCOUNTER — Inpatient Hospital Stay (HOSPITAL_COMMUNITY): Admitting: Anesthesiology

## 2024-01-04 ENCOUNTER — Encounter (HOSPITAL_COMMUNITY): Payer: Self-pay | Admitting: Family Medicine

## 2024-01-04 ENCOUNTER — Encounter (HOSPITAL_COMMUNITY)
Admission: EM | Disposition: A | Discharge status: Home / Self Care | Payer: Self-pay | Source: Home / Self Care | Attending: Family Medicine

## 2024-01-04 DIAGNOSIS — I340 Nonrheumatic mitral (valve) insufficiency: Secondary | ICD-10-CM

## 2024-01-04 DIAGNOSIS — Z7901 Long term (current) use of anticoagulants: Secondary | ICD-10-CM

## 2024-01-04 DIAGNOSIS — I5020 Unspecified systolic (congestive) heart failure: Secondary | ICD-10-CM

## 2024-01-04 DIAGNOSIS — K3189 Other diseases of stomach and duodenum: Secondary | ICD-10-CM

## 2024-01-04 DIAGNOSIS — K259 Gastric ulcer, unspecified as acute or chronic, without hemorrhage or perforation: Secondary | ICD-10-CM

## 2024-01-04 DIAGNOSIS — D649 Anemia, unspecified: Secondary | ICD-10-CM

## 2024-01-04 DIAGNOSIS — K068 Other specified disorders of gingiva and edentulous alveolar ridge: Secondary | ICD-10-CM

## 2024-01-04 DIAGNOSIS — Z95810 Presence of automatic (implantable) cardiac defibrillator: Secondary | ICD-10-CM

## 2024-01-04 DIAGNOSIS — I2510 Atherosclerotic heart disease of native coronary artery without angina pectoris: Secondary | ICD-10-CM

## 2024-01-04 DIAGNOSIS — D509 Iron deficiency anemia, unspecified: Secondary | ICD-10-CM

## 2024-01-04 DIAGNOSIS — I493 Ventricular premature depolarization: Secondary | ICD-10-CM | POA: Diagnosis not present

## 2024-01-04 DIAGNOSIS — E43 Unspecified severe protein-calorie malnutrition: Secondary | ICD-10-CM | POA: Insufficient documentation

## 2024-01-04 DIAGNOSIS — I200 Unstable angina: Secondary | ICD-10-CM | POA: Diagnosis not present

## 2024-01-04 DIAGNOSIS — D539 Nutritional anemia, unspecified: Secondary | ICD-10-CM | POA: Diagnosis not present

## 2024-01-04 HISTORY — PX: ESOPHAGOGASTRODUODENOSCOPY: SHX5428

## 2024-01-04 LAB — PREPARE RBC (CROSSMATCH)

## 2024-01-04 LAB — BASIC METABOLIC PANEL WITH GFR
Anion gap: 11 (ref 5–15)
BUN: 21 mg/dL (ref 8–23)
CO2: 20 mmol/L — ABNORMAL LOW (ref 22–32)
Calcium: 8.2 mg/dL — ABNORMAL LOW (ref 8.9–10.3)
Chloride: 105 mmol/L (ref 98–111)
Creatinine, Ser: 1.61 mg/dL — ABNORMAL HIGH (ref 0.61–1.24)
GFR, Estimated: 44 mL/min — ABNORMAL LOW (ref >60)
Glucose, Bld: 107 mg/dL — ABNORMAL HIGH (ref 70–99)
Potassium: 3.5 mmol/L (ref 3.5–5.1)
Sodium: 136 mmol/L (ref 135–145)

## 2024-01-04 LAB — CBC
HCT: 21.3 % — ABNORMAL LOW (ref 39.0–52.0)
Hemoglobin: 7 g/dL — ABNORMAL LOW (ref 13.0–17.0)
MCH: 34.3 pg — ABNORMAL HIGH (ref 26.0–34.0)
MCHC: 32.9 g/dL (ref 30.0–36.0)
MCV: 104.4 fL — ABNORMAL HIGH (ref 80.0–100.0)
Platelets: 128 K/uL — ABNORMAL LOW (ref 150–400)
RBC: 2.04 MIL/uL — ABNORMAL LOW (ref 4.22–5.81)
RDW: 16.1 % — ABNORMAL HIGH (ref 11.5–15.5)
WBC: 3.6 K/uL — ABNORMAL LOW (ref 4.0–10.5)
nRBC: 0 % (ref 0.0–0.2)

## 2024-01-04 LAB — VITAMIN B12: Vitamin B-12: 814 pg/mL (ref 180–914)

## 2024-01-04 LAB — FOLATE: Folate: <3 ng/mL — ABNORMAL LOW (ref >5.9)

## 2024-01-04 LAB — HEMOGLOBIN AND HEMATOCRIT, BLOOD
HCT: 21.5 % — ABNORMAL LOW (ref 39.0–52.0)
Hemoglobin: 7.1 g/dL — ABNORMAL LOW (ref 13.0–17.0)

## 2024-01-04 LAB — FERRITIN: Ferritin: 794 ng/mL — ABNORMAL HIGH (ref 24–336)

## 2024-01-04 LAB — IRON AND TIBC
Iron: 26 ug/dL — ABNORMAL LOW (ref 45–182)
Saturation Ratios: 14 % — ABNORMAL LOW (ref 17.9–39.5)
TIBC: 185 ug/dL — ABNORMAL LOW (ref 250–450)
UIBC: 158 ug/dL

## 2024-01-04 LAB — APTT
aPTT: 121 s — ABNORMAL HIGH (ref 24–36)
aPTT: 84 s — ABNORMAL HIGH (ref 24–36)

## 2024-01-04 SURGERY — EGD (ESOPHAGOGASTRODUODENOSCOPY)
Anesthesia: General

## 2024-01-04 SURGERY — RIGHT/LEFT HEART CATH AND CORONARY ANGIOGRAPHY
Anesthesia: LOCAL

## 2024-01-04 MED ORDER — SENNOSIDES-DOCUSATE SODIUM 8.6-50 MG PO TABS
2.0000 | ORAL_TABLET | Freq: Every day | ORAL | Status: DC
  Administered 2024-01-05: 2 via ORAL
  Filled 2024-01-04 (×2): qty 2

## 2024-01-04 MED ORDER — ASPIRIN 81 MG PO CHEW
81.0000 mg | CHEWABLE_TABLET | ORAL | Status: AC
  Administered 2024-01-04: 81 mg via ORAL
  Filled 2024-01-04: qty 1

## 2024-01-04 MED ORDER — LACTATED RINGERS IV SOLN: INTRAVENOUS | Status: DC

## 2024-01-04 MED ORDER — SODIUM CHLORIDE 0.9% IV SOLUTION: Freq: Once | INTRAVENOUS | Status: AC

## 2024-01-04 MED ORDER — SUCRALFATE 1 GM/10ML PO SUSP
1.0000 g | Freq: Three times a day (TID) | ORAL | Status: DC
  Administered 2024-01-04 – 2024-01-06 (×6): 1 g via ORAL
  Filled 2024-01-04 (×7): qty 10

## 2024-01-04 MED ORDER — ASPIRIN 81 MG PO CHEW: 81.0000 mg | CHEWABLE_TABLET | ORAL | Status: DC

## 2024-01-04 MED ORDER — SODIUM CHLORIDE 0.9 % IV SOLN
250.0000 mg | Freq: Once | INTRAVENOUS | Status: AC
  Administered 2024-01-04: 250 mg via INTRAVENOUS
  Filled 2024-01-04: qty 20

## 2024-01-04 MED ORDER — ENSURE PLUS HIGH PROTEIN PO LIQD
237.0000 mL | Freq: Three times a day (TID) | ORAL | Status: DC
  Administered 2024-01-05 – 2024-01-06 (×3): 237 mL via ORAL

## 2024-01-04 MED ORDER — STERILE WATER FOR IRRIGATION IR SOLN
Status: DC | PRN
  Administered 2024-01-04: 60 mL

## 2024-01-04 MED ORDER — PROPOFOL 10 MG/ML IV BOLUS
INTRAVENOUS | Status: DC | PRN
  Administered 2024-01-04: 40 mg via INTRAVENOUS
  Administered 2024-01-04: 125 ug/kg/min via INTRAVENOUS
  Administered 2024-01-04: 60 mg via INTRAVENOUS

## 2024-01-04 MED ORDER — MAGNESIUM CITRATE PO SOLN
1.0000 | Freq: Once | ORAL | Status: AC
  Administered 2024-01-04: 1 via ORAL
  Filled 2024-01-04: qty 296

## 2024-01-04 MED ORDER — PHENYLEPHRINE 80 MCG/ML (10ML) SYRINGE FOR IV PUSH (FOR BLOOD PRESSURE SUPPORT)
PREFILLED_SYRINGE | INTRAVENOUS | Status: DC | PRN
  Administered 2024-01-04: 80 ug via INTRAVENOUS

## 2024-01-04 MED ORDER — FERROUS SULFATE 325 (65 FE) MG PO TABS
325.0000 mg | ORAL_TABLET | Freq: Two times a day (BID) | ORAL | Status: DC
  Administered 2024-01-05 – 2024-01-06 (×3): 325 mg via ORAL
  Filled 2024-01-04 (×3): qty 1

## 2024-01-04 MED ORDER — SODIUM CHLORIDE 0.9 % IV SOLN: INTRAVENOUS | Status: DC

## 2024-01-04 MED ORDER — SENNOSIDES-DOCUSATE SODIUM 8.6-50 MG PO TABS: 1.0000 | ORAL_TABLET | Freq: Every day | ORAL | Status: DC

## 2024-01-04 MED ORDER — LIDOCAINE 2% (20 MG/ML) 5 ML SYRINGE
INTRAMUSCULAR | Status: DC | PRN
  Administered 2024-01-04: 60 mg via INTRAVENOUS

## 2024-01-04 NOTE — Anesthesia Procedure Notes (Signed)
 Date/Time: 01/04/2024 2:13 PM  Performed by: Para Jerelene CROME, CRNAOxygen Delivery Method: Nasal cannula Comments: OptiFlow Nasal Cannula.

## 2024-01-04 NOTE — Consult Note (Addendum)
 Gastroenterology Consult   Referring Provider: No ref. provider found Primary Care Physician:  Meade Bigness, MD Primary Gastroenterologist:  Dr. Garlan Alfred, [place])  Patient ID: Brent Lenoir.; [id number]; [birth year]   Admit date: 01/02/2024  LOS: 1 day   Date of Consultation: 01/04/2024  Reason for Consultation: Symptomatic anemia  History of Present Illness   Brent Blasingame. is a 78 y.o. year old male with history of symptomatic bradycardia with pacemaker (CRT-D), chronic A-fib with multiple prior ablations on Eliquis , frequent PVCs, mild to moderate CAD, and cardiomyopathy with previous EF 35-40%, moderate to severe mitral regurgitation, CVA, and RA who presented to [facility] with chest pain.  In the ED he was found to be afebrile with normal oxygen saturations as well as normal BP and hypertension.  On admission labs were significant for a creatinine of 1.79, hemoglobin 9.2, troponins 24 x 2, and proBNP 9018.  He had a CTA of the chest abdomen pelvis which was negative for any acute findings other than small pericardial effusion.  Cardiology has been following with him and recommended some IV hydration, nitroglycerin , aspirin , and fentanyl  while in the ED.  I seen him in consultation yesterday and recommended repeat echocardiogram, continuing his home losartan  and Lasix , scheduled for left and right heart cath planned for today.  Per discussions with cardiology Dr. Mallipeddi and hospitalist Dr. Willette he has had worsening anemia since admission and were concerned about completing heart catheterization today therefore they discussed canceling this given his LVEF is actually improved to 55% as of echocardiogram yesterday 10/23 and feeling as though his anemia could be contributing to some mild chest pain and shortness of breath that he experienced on presentation.  Per cardiology and hospitalist patient has reported that he had a colonoscopy in [place]  a few  months ago that was normal other than hemorrhoids.  He has reported some recent hemorrhoidal bleeding within the last week.  Per review of his chart, it appears his last dose of Eliquis  was this 11 PM on 10/22.  Consult:  Denies any NSAID use.  Earlier this year states he was taken off of Xarelto  and placed on Brilinta  after 2 mini strokes in June of this year.  Also was having significant amounts of bruising while on this as well as epistaxis.  Denies any ED visits or hospitalizations since June.  Denies any overt abdominal pain currently however states that on his presentation his pain actually began more in the epigastric region and extended up the mid chest area up into his neck which was unrelieved with nitroglycerin .  He has admitted to some increase in frequency of indigestion symptoms over the last several weeks as well.  Notes over the last several weeks to 1 month has been experiencing worsening dyspnea on exertion as well.  Initially thought this was secondary to physical therapy overexertion given he has been following with PT post strokelike symptoms.  States last week he had less than 24 hours of rectal bleeding via likely ruptured thrombosed hemorrhoid.  He has been suffering from intermittent constipation and recently was straining which likely caused a rupture of hemorrhoid tissue.   Patient wife reported that he has been following with GI in [place]  with [facility] (Dr. Garlan) they reported to me that last year in 2024 he underwent an upper endoscopy for some abdominal pain and evaluation of anemia and was found to have a duodenal ulcer which was treated with PPI twice daily.  He was  following with them regularly and also taking over-the-counter oral iron and upon follow-up earlier this year he was noted to have improved anemia symptoms however started having recurrent abdominal pain as well as some issues with diarrhea therefore he underwent repeat upper endoscopy and  colonoscopy.  I was unable to find these reports but reviewed some progress notes through the [place] [facility] portal on his wife's phone with their permission which reported a chronic duodenal ulcer on prior upper endoscopy.  They reported to me verbally that colonoscopy was completely normal other than the noted hemorrhoids and no polyp removal.  Earlier this year his PPI was reduced to once daily with improvement of his anemia and given some noted healing on upper endoscopy of the duodenal ulcer however over the last several months he has decreased to as needed use of his PPI and recently having worsening symptoms.  He denies any overt melena or maroon-colored stool.  Changes in appetite.    Full report is unavailable however per review of care everywhere he has underwent an upper endoscopy and in 2016 with IMPRESSIONS noting esophageal stricture s/p balloon dilation up to 20 mm as well as evidence of gastritis on EGD and a hyperplastic polyp in the sigmoid colon on colonoscopy and was recommended to have a follow-up colonoscopy in 10 years.  Pertinent lab trend below:     Latest Ref Rng & Units 01/04/2024    3:34 AM 01/03/2024    3:41 AM 01/02/2024    6:00 PM 08/29/2023    5:19 AM 08/27/2023    5:34 AM 08/26/2023   11:34 AM 08/25/2023    4:08 PM  CBC  WBC 4.0 - 10.5 K/uL 3.6  6.1  7.2  6.5  6.6  8.1  8.5   Hemoglobin 13.0 - 17.0 g/dL 7.0  8.0  9.2  89.4  87.8  12.2  13.8   Hematocrit 39.0 - 52.0 % 21.3  25.0  28.6  31.4  35.0  36.6  40.7   Platelets 150 - 400 K/uL 128  178  232  243  237  245  197    Iron/TIBC/Ferritin/ %Sat    Component Value Date/Time   IRON 26 (L) 01/04/2024 0812   TIBC 185 (L) 01/04/2024 0812   FERRITIN 618 (H) 01/03/2024 0341   IRONPCTSAT 14 (L) 01/04/2024 0812    Past Medical History:  Diagnosis Date   A-fib [facility])    AICD (automatic cardioverter/defibrillator) present    Barrett's esophagus 2011   In [place] . Records in Epic   CHF (congestive heart  failure) (HCC)    Chronic kidney disease    Coronary atherosclerosis of native coronary artery    Nonobstructive   Essential hypertension, benign    GERD (gastroesophageal reflux disease)    Heart block AV first degree    Chronic   History of kidney stones    Hypercholesterolemia    Mixed hyperlipidemia    NSVT (nonsustained ventricular tachycardia) (HCC)    Holter monitor, 2011; recommended RF ablation by Dr. Kelsie - never required   PAF (paroxysmal atrial fibrillation) [facility])    Recently documented in [facility] June 2016   Pre-diabetes    Rheumatoid arthritis (HCC)    Sinus bradycardia    Symptomatic, acebutolol discontinued   Skin cancer    Sleep apnea    pt denies    Past Surgical History:  Procedure Laterality Date   CARDIAC CATHETERIZATION N/A 12/13/2015   Procedure: Left Heart Cath and Coronary Angiography;  Surgeon: Dorn JINNY Lesches, MD;  Location: [facility] INVASIVE CV LAB;  Service: Cardiovascular;  Laterality: N/A;   CARDIOVERSION N/A 04/11/2016   Procedure: CARDIOVERSION;  Surgeon: Annabella Scarce, MD;  Location: [facility] ENDOSCOPY;  Service: Cardiovascular;  Laterality: N/A;   COLONOSCOPY  03/13/2012   [place], [place]--Dr. Spainhour   EP IMPLANTABLE DEVICE N/A 02/01/2016   Procedure: Pacemaker Implant;  Surgeon: Lynwood Rakers, MD;  Location: [facility] INVASIVE CV LAB;  Service: Cardiovascular;  Laterality: N/A;   ESOPHAGOGASTRODUODENOSCOPY  03/13/2012   [place], [place]--Dr. Spainhour   HERNIA REPAIR     2   IR ANGIO INTRA EXTRACRAN SEL COM CAROTID INNOMINATE BILAT MOD SED  08/27/2023   IR ANGIO VERTEBRAL SEL VERTEBRAL UNI L MOD SED  08/27/2023   KIDNEY STONE SURGERY     LEFT HEART CATHETERIZATION WITH CORONARY ANGIOGRAM N/A 08/20/2012   Procedure: LEFT HEART CATHETERIZATION WITH CORONARY ANGIOGRAM;  Surgeon: Lynwood Schilling, MD;  Location: [facility] CATH LAB;  Service: Cardiovascular;  Laterality: N/A;   Left leg surgery  03/13/1977   RADIOLOGY WITH ANESTHESIA N/A 08/27/2023   Procedure:  RADIOLOGY WITH ANESTHESIA;  Surgeon: Dolphus Carrion, MD;  Location: [facility] OR;  Service: Radiology;  Laterality: N/A;   SKIN CANCER EXCISION     TOTAL HIP ARTHROPLASTY Left 01/11/2022   Procedure: TOTAL HIP ARTHROPLASTY ANTERIOR APPROACH;  Surgeon: Fidel Rogue, MD;  Location: [facility] ORS;  Service: Orthopedics;  Laterality: Left;  150    Prior to Admission medications   Medication Sig Start Date End Date Taking? Authorizing Provider  acetaminophen  (TYLENOL ) 325 MG tablet Take 650 mg by mouth as needed for mild pain (pain score 1-3).   Yes [provider]  apixaban  (ELIQUIS ) 2.5 MG TABS tablet Take 1 tablet (2.5 mg total) by mouth 2 (two) times daily. 08/29/23  Yes Odell Celinda Balo, MD  chlorhexidine  (PERIDEX ) 0.12 % solution Use as directed 5 mLs in the mouth or throat 2 (two) times daily. 12/03/23  Yes [provider]  CRANBERRY PO Take 500 mg by mouth daily.   Yes [provider]  folic acid  (FOLVITE ) 1 MG tablet Take 1 mg by mouth daily.  NOT on Friday d/t methotrexate  12/13/22  Yes [provider]  furosemide  (LASIX ) 20 MG tablet Take 1 tablet (20 mg total) by mouth daily. 08/21/23  Yes Sabharwal, Aditya, DO  ipratropium (ATROVENT) 0.06 % nasal spray Place 2 sprays into both nostrils daily as needed for rhinitis.   Yes [provider]  losartan  (COZAAR ) 25 MG tablet Take 0.5 tablets (12.5 mg total) by mouth daily. Patient taking differently: Take 12.5 mg by mouth in the morning and at bedtime. 09/25/23  Yes Mallipeddi, Vishnu P, MD  methotrexate  (RHEUMATREX) 2.5 MG tablet Take 2.5 mg by mouth every Friday. 6 pills weekly   Yes [provider]  mexiletine (MEXITIL ) 150 MG capsule Take 150 mg by mouth 2 (two) times daily.   Yes [provider]  NITROSTAT  0.4 MG SL tablet DISSOLVE 1 TABLET UNDER TONGUE EVERY 5 MINUTES AS NEEDED FOR CHEST PAIN Patient taking differently: Place 0.4 mg under the tongue every 5 (five) minutes as needed for  chest pain. 05/11/14  Yes Debera Jayson MATSU, MD  ondansetron  (ZOFRAN ) 4 MG tablet Take 4 mg by mouth every 8 (eight) hours as needed for nausea. 01/15/23  Yes [provider]  pantoprazole  (PROTONIX ) 40 MG tablet Take 40 mg by mouth daily as needed (heartburn, acid reflux).   Yes [provider]  potassium chloride  SA (  KLOR-CON  M) 20 MEQ tablet Take 20 mEq by mouth daily.   Yes [provider]  Probiotic Product (PROBIOTIC DAILY PO) Take 110 mg by mouth daily.   Yes [provider]  rosuvastatin  (CRESTOR ) 5 MG tablet Take 5 mg by mouth every Sunday.   Yes [provider]  sodium bicarbonate  650 MG tablet Take 650 mg by mouth daily.   Yes [provider]  traMADol (ULTRAM) 50 MG tablet Take 50 mg by mouth every 6 (six) hours as needed for moderate pain (pain score 4-6).   Yes [provider]    Current Facility-Administered Medications  Medication Dose Route Frequency Provider Last Rate Last Admin   0.9 %  sodium chloride  infusion (Manually program via Guardrails IV Fluids)   Intravenous Once Shahmehdi, Seyed A, MD       acetaminophen  (TYLENOL ) tablet 650 mg  650 mg Oral Q4H PRN Shahmehdi, Seyed A, MD       alum & mag hydroxide-simeth (MAALOX/MYLANTA) 200-200-20 MG/5ML suspension 30 mL  30 mL Oral Q4H PRN Shahmehdi, Seyed A, MD       azithromycin (ZITHROMAX) tablet 500 mg  500 mg Oral Daily Shahmehdi, Seyed A, MD   500 mg at 01/03/24 1136   folic acid  (FOLVITE ) tablet 1 mg  1 mg Oral Daily Shahmehdi, Seyed A, MD   1 mg at 01/03/24 0807   furosemide  (LASIX ) tablet 20 mg  20 mg Oral Daily Shahmehdi, Seyed A, MD       guaiFENesin-dextromethorphan (ROBITUSSIN DM) 100-10 MG/5ML syrup 10 mL  10 mL Oral Q8H Shahmehdi, Seyed A, MD   10 mL at 01/04/24 0600   heparin  ADULT infusion 100 units/mL (25000 units/250mL)  650 Units/hr Intravenous Continuous Mattie Marvetta SQUIBB, RPH 6.5 mL/hr at 01/04/24 0233 650 Units/hr at 01/04/24 0233   losartan  (COZAAR )  tablet 12.5 mg  12.5 mg Oral QHS Shahmehdi, Seyed A, MD   12.5 mg at 01/03/24 2135   methocarbamol  (ROBAXIN ) tablet 500 mg  500 mg Oral TID Shahmehdi, Seyed A, MD   500 mg at 01/03/24 2135   mexiletine (MEXITIL ) capsule 150 mg  150 mg Oral BID Shahmehdi, Seyed A, MD   150 mg at 01/03/24 2135   ondansetron  (ZOFRAN ) injection 4 mg  4 mg Intravenous Q6H PRN Shahmehdi, Seyed A, MD       oxyCODONE  (Oxy IR/ROXICODONE ) immediate release tablet 5-10 mg  5-10 mg Oral Q4H PRN Shahmehdi, Seyed A, MD       pantoprazole  (PROTONIX ) EC tablet 40 mg  40 mg Oral Daily Shahmehdi, Seyed A, MD   40 mg at 01/03/24 0807   [START ON 01/06/2024] rosuvastatin  (CRESTOR ) tablet 5 mg  5 mg Oral Q Sun Shahmehdi, Seyed A, MD        Allergies as of 01/02/2024 - Review Complete 01/02/2024  Allergen Reaction Noted   Amiodarone Other (See Comments) 03/21/2021   Flecainide  Other (See Comments) 12/12/2021   Tamsulosin Hives and Itching 07/07/2015   Tamsulosin hcl Hives, Itching, Dermatitis, and Rash 07/07/2015   Carvedilol  Other (See Comments) 06/02/2022   Dapagliflozin Diarrhea 11/23/2021   Eplerenone Other (See Comments) 03/12/2023   Lisinopril  Nausea And Vomiting and Nausea Only 12/12/2021   Metoprolol  Other (See Comments) 02/07/2023   Sacubitril-valsartan Nausea And Vomiting and Nausea Only 12/12/2021   Spironolactone  Swelling and Other (See Comments) 11/30/2021   Statins Other (See Comments) 07/07/2015   Sulfasalazine Nausea Only 12/12/2021    Family History  Problem Relation Age of Onset  Colon cancer Mother    Other Sister         AGE-62-HEALTHY   Other Sister        AGE 64-HEALTHY   Other Sister        AGE 67 HEALTHY   Other Son        AGE 69 HEALTHY    Social History   Socioeconomic History   Marital status: Married    Spouse name: Not on file   Number of children: 1   Years of education: Not on file   Highest education level: Not on file  Occupational History   Occupation: [employer]    Comment:  Quarry manager  Tobacco Use   Smoking status: Never    Passive exposure: Never   Smokeless tobacco: Never  Vaping Use   Vaping status: Never Used  Substance and Sexual Activity   Alcohol  use: Yes    Alcohol /week: 2.0 standard drinks of alcohol     Types: 2 Cans of beer per week    Comment: Occasional beer   Drug use: No   Sexual activity: Yes    Partners: Female  Other Topics Concern   Not on file  Social History Narrative   Daily caffeine    Social Drivers of Health   Financial Resource Strain: Low Risk  (07/01/2021)   Received from [facility]   Overall Financial Resource Strain (CARDIA)  Food Insecurity: No Food Insecurity (01/03/2024)   Hunger Vital Sign    Worried About Running Out of Food in the Last Year: Never true    Ran Out of Food in the Last Year: Never true  Transportation Needs: No Transportation Needs (01/03/2024)   PRAPARE - Administrator, Civil Service (Medical): No    Lack of Transportation (Non-Medical): No  Physical Activity: Not on file  Stress: Not on file  Social Connections: Moderately Integrated (01/03/2024)   Social Connection and Isolation Panel    Frequency of Communication with Friends and Family: Three times a week    Frequency of Social Gatherings with Friends and Family: Twice a week    Attends Religious Services: 1 to 4 times per year    Active Member of [employer] or Organizations: No    Attends Banker Meetings: Never    Marital Status: Married  Catering manager Violence: Not At Risk (01/03/2024)   Humiliation, Afraid, Rape, and Kick questionnaire    Fear of Current or Ex-Partner: No    Emotionally Abused: No    Physically Abused: No    Sexually Abused: No     Review of Systems   Gen:  Denies any fever, chills, loss of appetite, change in weight or weight loss CV: + exertional chest pain. Denies heart palpitations, syncope, edema  Resp: + DOE. Denies shortness of breath with  rest, cough, wheezing, coughing up blood, and pleurisy. GI: see HPI GU : Denies urinary burning, blood in urine, urinary frequency, and urinary incontinence. MS: + weakness. Denies joint pain, swelling, cramps, and atrophy.  Psych: Denies depression, anxiety, memory loss, hallucinations, and confusion. Heme: + bleeding hemorrhoid and scattered bruising. Denies epistaxis recently.  Neuro:  Denies any headaches, dizziness, paresthesias, shaking  Physical Exam   Vital Signs in last 24 hours: Temp:  [97.9 F (36.6 C)-99.7 F (37.6 C)] 97.9 F (36.6 C) (10/24 0556) Pulse Rate:  [54-71] 69 (10/24 0556) Resp:  [18-20] 18 (10/24 0556) BP: (105-129)/(50-72) 121/69 (10/24 0556) SpO2:  [98 %-100 %] 99 % (  10/24 0556) Weight:  [47.1 kg] 47.1 kg (10/24 0556) Last BM Date : 01/02/24  General:   Alert, pleasant and cooperative in NAD, chronically ill appearing.  Head:  Normocephalic and atraumatic. Eyes:  Sclera clear, no icterus.   Conjunctiva pink. Ears:  Normal auditory acuity. Mouth:  Poor dentition.  Neck:  Supple; no masses Lungs:  Clear throughout to auscultation.   No wheezes, crackles, or rhonchi. No acute distress. Heart:  Regular rate and rhythm; no murmurs, clicks, rubs,  or gallops. Abdomen:  Soft, nontender and nondistended. No masses, hepatosplenomegaly or hernias noted. Normal bowel sounds, without guarding, and without rebound.   Rectal: deferred Msk:  Symmetrical without gross deformities. Normal posture. Extremities:  Without clubbing or edema. Neurologic:  Alert and  oriented x4. Skin:  Intact. Significant bruising and thin skin.  Psych:  Alert and cooperative. Normal mood and affect.  Intake/Output from previous day: 10/23 0701 - 10/24 0700 In: 106 [I.V.:106] Out: 250 [Urine:250] Intake/Output this shift: No intake/output data recorded.  Labs/Studies   Recent Labs Recent Labs    01/02/24 1800 01/03/24 0341 01/04/24 0334  WBC 7.2 6.1 3.6*  HGB 9.2* 8.0* 7.0*   HCT 28.6* 25.0* 21.3*  PLT 232 178 128*   BMET Recent Labs    01/02/24 1800 01/03/24 0341 01/04/24 0334  NA 140 139 136  K 4.1 4.5 3.5  CL 106 107 105  CO2 20* 20* 20*  GLUCOSE 102* 94 107*  BUN 24* 23 21  CREATININE 1.79* 1.63* 1.61*  CALCIUM  9.1 8.8* 8.2*   LFT Recent Labs    01/02/24 1800  PROT 6.6  ALBUMIN  4.0  AST 29  ALT 30  ALKPHOS 67  BILITOT 1.2   PT/INR No results for input(s): LABPROT, INR in the last 72 hours. Hepatitis Panel No results for input(s): HEPBSAG, HCVAB, HEPAIGM, HEPBIGM in the last 72 hours. C-Diff No results for input(s): CDIFFTOX in the last 72 hours.  Radiology/Studies ECHOCARDIOGRAM COMPLETE Result Date: 01/03/2024    ECHOCARDIOGRAM REPORT   Patient Name:   Brent Milbourne. Date of Exam: 01/03/2024 Medical Rec #:  [id number]            Height:       68.0 in Accession #:    [id number]           Weight:       113.1 lb Date of Birth:  01-May-1945            BSA:          1.604 m Patient Age:    78 years             BP:           141/87 mmHg Patient Gender: M                    HR:           77 bpm. Exam Location:  [facility] Procedure: 2D Echo, Cardiac Doppler and Color Doppler (Both Spectral and Color            Flow Doppler were utilized during procedure). Indications:    Chest Pain R07.9, Pericardial effusion I31.3  History:        Patient has prior history of Echocardiogram examinations, most                 recent 08/22/2023. CAD, Stroke, Arrythmias:Atrial Flutter, PVC  and Atrial Fibrillation, Signs/Symptoms:Chest Pain, Fatigue and                 Shortness of Breath; Risk Factors:Hypertension and Dyslipidemia.                 H/O Chronic kidney disease, Second degree AV block,                 Hyperlipidemia.  Sonographer:    BERNARDA ROCKS Referring Phys: [id number] TIMOTHY S OPYD IMPRESSIONS  1. Left ventricular ejection fraction, by estimation, is 55%. The left ventricle has normal function. Left ventricular  endocardial border not optimally defined to evaluate regional wall motion. Left ventricular diastolic function could not be evaluated.  2. Right ventricular systolic function is normal. The right ventricular size is normal. There is normal pulmonary artery systolic pressure.  3. Left atrial size was severely dilated.  4. Right atrial size was severely dilated.  5. The mitral valve is abnormal. Moderate to severe mitral valve regurgitation. No evidence of mitral stenosis. There is mild prolapse of posterior leaflet of the mitral valve. The mean mitral valve gradient is 1.0 mmHg.  6. Two TR jets seen, mild in intensity.  7. The aortic valve is tricuspid. Aortic valve regurgitation is not visualized. No aortic stenosis is present. Aortic valve mean gradient measures 2.0 mmHg.  8. The inferior vena cava is normal in size with greater than 50% respiratory variability, suggesting right atrial pressure of 3 mmHg. Comparison(s): Changes from prior study are noted. LVEF improved from 35-40% to 55% now. FINDINGS  Left Ventricle: Left ventricular ejection fraction, by estimation, is 55%. The left ventricle has normal function. Left ventricular endocardial border not optimally defined to evaluate regional wall motion. Strain was performed and the global longitudinal strain is indeterminate. The left ventricular internal cavity size was normal in size. There is no left ventricular hypertrophy. Abnormal (paradoxical) septal motion, consistent with RV pacemaker. Left ventricular diastolic function could not be evaluated due to abnormal septal motion. Left ventricular diastolic function could not be evaluated. Right Ventricle: The right ventricular size is normal. No increase in right ventricular wall thickness. Right ventricular systolic function is normal. There is normal pulmonary artery systolic pressure. The tricuspid regurgitant velocity is 2.68 m/s, and  with an assumed right atrial pressure of 3 mmHg, the estimated right  ventricular systolic pressure is 31.7 mmHg. Left Atrium: Left atrial size was severely dilated. Right Atrium: Right atrial size was severely dilated. Pericardium: There is no evidence of pericardial effusion. Mitral Valve: The mitral valve is abnormal. There is mild prolapse of posterior leaflet of the mitral valve. Moderate to severe mitral valve regurgitation. No evidence of mitral valve stenosis. MV peak gradient, 3.5 mmHg. The mean mitral valve gradient is 1.0 mmHg. Tricuspid Valve: Two TR jets seen, mild in intensity. The tricuspid valve is normal in structure. Tricuspid valve regurgitation is mild . No evidence of tricuspid stenosis. Aortic Valve: The aortic valve is tricuspid. Aortic valve regurgitation is not visualized. No aortic stenosis is present. Aortic valve mean gradient measures 2.0 mmHg. Aortic valve peak gradient measures 3.8 mmHg. Aortic valve area, by VTI measures 2.20 cm. Pulmonic Valve: The pulmonic valve was normal in structure. Pulmonic valve regurgitation is trivial. No evidence of pulmonic stenosis. Aorta: The aortic root and ascending aorta are structurally normal, with no evidence of dilitation. Venous: The inferior vena cava is normal in size with greater than 50% respiratory variability, suggesting right atrial pressure of 3 mmHg. IAS/Shunts: No  atrial level shunt detected by color flow Doppler. Additional Comments: 3D was performed not requiring image post processing on an independent workstation and was indeterminate. A device lead is visualized.  LEFT VENTRICLE PLAX 2D LVIDd:         4.90 cm LVIDs:         3.40 cm LV PW:         0.70 cm LV IVS:        0.60 cm LVOT diam:     2.00 cm LV SV:         44 LV SV Index:   27 LVOT Area:     3.14 cm  LV Volumes (MOD) LV vol d, MOD A2C: 120.0 ml LV vol d, MOD A4C: 131.0 ml LV vol s, MOD A2C: 44.0 ml LV vol s, MOD A4C: 59.5 ml LV SV MOD A2C:     76.0 ml LV SV MOD A4C:     131.0 ml LV SV MOD BP:      72.1 ml RIGHT VENTRICLE             IVC RV  Basal diam:  3.60 cm     IVC diam: 1.60 cm RV S prime:     11.40 cm/s TAPSE (M-mode): 1.5 cm RVSP:           31.7 mmHg LEFT ATRIUM              Index        RIGHT ATRIUM           Index LA diam:        4.40 cm  2.74 cm/m   RA Pressure: 3.00 mmHg LA Vol (A2C):   110.0 ml 68.58 ml/m  RA Area:     23.20 cm LA Vol (A4C):   73.7 ml  45.95 ml/m  RA Volume:   68.00 ml  42.39 ml/m LA Biplane Vol: 90.7 ml  56.55 ml/m  AORTIC VALVE                    PULMONIC VALVE AV Area (Vmax):    2.32 cm     PV Vmax:          0.69 m/s AV Area (Vmean):   2.05 cm     PV Peak grad:     1.9 mmHg AV Area (VTI):     2.20 cm     PR End Diast Vel: 5.02 msec AV Vmax:           97.50 cm/s AV Vmean:          69.700 cm/s AV VTI:            0.200 m AV Peak Grad:      3.8 mmHg AV Mean Grad:      2.0 mmHg LVOT Vmax:         71.90 cm/s LVOT Vmean:        45.500 cm/s LVOT VTI:          0.140 m LVOT/AV VTI ratio: 0.70  AORTA Ao Root diam: 3.40 cm Ao Asc diam:  3.40 cm MITRAL VALVE                  TRICUSPID VALVE MV Area (PHT): 2.46 cm       TR Peak grad:   28.7 mmHg MV Area VTI:   1.80 cm       TR Vmax:        268.00 cm/s  MV Peak grad:  3.5 mmHg       Estimated RAP:  3.00 mmHg MV Mean grad:  1.0 mmHg       RVSP:           31.7 mmHg MV Vmax:       0.94 m/s MV Vmean:      48.0 cm/s      SHUNTS MV Decel Time: 309 msec       Systemic VTI:  0.14 m MR Peak grad:    118.1 mmHg   Systemic Diam: 2.00 cm MR Mean grad:    76.5 mmHg MR Vmax:         543.33 cm/s MR Vmean:        411.5 cm/s MR PISA:         6.28 cm MR PISA Eff ROA: 36 mm MR PISA Radius:  1.00 cm MV E velocity: 88.20 cm/s MV A velocity: 31.10 cm/s MV E/A ratio:  2.84 Vishnu Priya Mallipeddi Electronically signed by Diannah Late Mallipeddi Signature Date/Time: 01/03/2024/3:06:13 PM    Final    CT Angio Chest/Abd/Pel for Dissection W and/or Wo Contrast Result Date: 01/02/2024 CLINICAL DATA:  Chest and abdominal pain. Concern for aortic dissection. EXAM: CT ANGIOGRAPHY CHEST, ABDOMEN AND  PELVIS TECHNIQUE: Non-contrast CT of the chest was initially obtained. Multidetector CT imaging through the chest, abdomen and pelvis was performed using the standard protocol during bolus administration of intravenous contrast. Multiplanar reconstructed images and MIPs were obtained and reviewed to evaluate the vascular anatomy. RADIATION DOSE REDUCTION: This exam was performed according to the departmental dose-optimization program which includes automated exposure control, adjustment of the mA and/or kV according to patient size and/or use of iterative reconstruction technique. CONTRAST:  80mL OMNIPAQUE  IOHEXOL  350 MG/ML SOLN COMPARISON:  Chest radiograph dated 01/02/2024. FINDINGS: CTA CHEST FINDINGS Cardiovascular: Top-normal cardiac size. There is coronary vascular calcification. Left pectoral pacemaker device. Small pericardial effusion measuring 6 mm in thickness. Mild atherosclerotic calcification of the thoracic aorta. No aneurysmal dilatation or dissection. The origins of the great vessels of the aortic arch appear patent. No pulmonary artery embolus identified. Mediastinum/Nodes: No hilar or mediastinal adenopathy. The esophagus and the thyroid  gland are grossly unremarkable. No mediastinal fluid collection. Lungs/Pleura: Minimal bibasilar dependent atelectasis. No focal consolidation, pleural effusion, pneumothorax. The central airways are patent. Musculoskeletal: Osteopenia with degenerative changes of the spine. No acute osseous pathology. Review of the MIP images confirms the above findings. CTA ABDOMEN AND PELVIS FINDINGS VASCULAR Aorta: Mild atherosclerotic calcification. No aneurysmal dilatation or dissection. Celiac: The celiac trunk and its major branches are patent. SMA: The SMA is patent. Renals: The renal arteries are patent. IMA: The IMA is patent. Inflow: The iliac arteries are patent. No as well dilatation or dissection. Veins: No obvious venous abnormality within the limitations of this  arterial phase study. Review of the MIP images confirms the above findings. NON-VASCULAR No intra-abdominal free air or free fluid. Hepatobiliary: There is irregularity of the liver contour suspicious for cirrhosis. No biliary ductal dilatation. Small cyst in the left lobe of the liver. No calcified gallstone or pericholecystic fluid. Pancreas: Unremarkable. No pancreatic ductal dilatation or surrounding inflammatory changes. Spleen: Normal in size without focal abnormality. Adrenals/Urinary Tract: The adrenal glands unremarkable. The kidneys, visualized ureters, and urinary bladder unremarkable. Stomach/Bowel: There is mild colonic diverticulosis. There is no bowel obstruction or active inflammation. The appendix is normal. Lymphatic: No adenopathy. Reproductive: Prostate brachytherapy seeds noted. Other: None Musculoskeletal: Osteopenia with degenerative changes. No acute  osseous pathology. Total left hip arthroplasty with associated streak artifact limiting evaluation of the pelvic structures. Review of the MIP images confirms the above findings. IMPRESSION: 1. No acute intrathoracic, abdominal, or pelvic pathology. No aortic aneurysm or dissection. 2. Mild colonic diverticulosis. No bowel obstruction. Normal appendix. 3.  Aortic Atherosclerosis (ICD10-I70.0). Electronically Signed   By: Vanetta Chou M.D.   On: 01/02/2024 19:45   DG Chest Port 1 View Result Date: 01/02/2024 CLINICAL DATA:  355200 Chest pain 355200 EXAM: PORTABLE CHEST - 1 VIEW COMPARISON:  August 26, 2023 FINDINGS: Lower lung volumes. Biapical pleural thickening. No focal airspace consolidation, pleural effusion, or pneumothorax. Mild cardiomegaly. Left chest pacemaker/AICD with leads terminating in the right atrium, right ventricle, and coronary sinus. tortuous aorta with aortic atherosclerosis. No acute fracture or destructive lesions. Multilevel thoracic osteophytosis. AC joint osteoarthritis. IMPRESSION: Low lung volumes.  Otherwise, no  acute cardiopulmonary abnormality. Electronically Signed   By: Rogelia Myers M.D.   On: 01/02/2024 18:38    Assessment   Brent Podgorski. is a 78 y.o. 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  GI consulted for further evaluation of anemia  Symptomatic anemia, hemorrhoids with rectal bleeding: - Significant drop in hemoglobin from 13.8 ( 08/25/23) to 7.0 today.  - Presented with complaints of exertional shortness of breath and chest pain - Patient report colonoscopy in [place], [place]  normal other than hemorrhoids a few months ago - Has had bleeding from his hemorrhoids within the last week - Anemia panel indicates normal B12 and folate but with low iron, iron sat today but normal yesterday with elevated ferritin.  - Currently on PPI daily and has been on Eliquis  twice daily for his history of A-fib with last dose being 10/22 around 2300 - Given the level of his anemia and the fact that he remains symptomatic, he needs full evaluation of his anemia to assess for GI etiology. - Given hemoglobin is 7, agree with transfusion - History of GERD and Barrett's esophagus with last upper endoscopy in 2016 Echo performed yesterday with moderate to severe mitral regurgitation but with improved EF to 55%  Per discussion with the patient and his wife he underwent upper endoscopy in 2024 with presence of duodenal ulcer and was treated with PPI twice daily as well as iron supplementation for his anemia at that time.  In follow-up in February of this year with Dr. Garlan because of some abdominal pain and issues with diarrhea and hemorrhoids he underwent repeat upper endoscopy and  colonoscopy which he reported and normal colonoscopy with only evidence of hemorrhoids and wife reports that he was told from the upper endoscopy that the ulcer was healed and no other issues and his anemia ended up resolving.  His follow-up that I reviewed on his wife's cell phone in regards to his Sov[place] Chart his GI provider noted chronic duodenal ulcer present.  Given his history I suspect that his anemia currently is likely secondary to chronic peptic ulcer disease in the setting of decreased PPI use.  He denies any NSAIDs recently.  Given this we need to further evaluate with an upper endoscopy and patient and wife are agreeable to this.  I did discuss with him that we  need for him to have at least 1 unit PRBCs transfused prior to getting him down for procedure and would like to be able to get this done early afternoon.  If unable to accomplish this we may need to postpone procedure until tomorrow.   Thrombocytopenia, abnormal imaging of the liver: - Thrombocytopenia new as of today, platelets normal on presentation and now down to 128.   - Recent chest and abdominal imaging with irregularity of the liver contour noted suspicious for cirrhosis but with normal spleen and no reports of any concerns for esophageal varices - Further evaluation outpatient as needed. If no evidence of varices or portal gastropathy then recommend repeat imaging in 3-6 months with primary GI to confirm or rule out cirrhosis. - No elevations in LFTs. - Has had normal sodiums and normal albumin  but with elevated creatinine. - MELD 3.0: 18 (mostly influenced by CKD Stage 3b)   Plan / Recommendations   N.p.o. Continue to hold Eliquis  EGD today with Dr. Shaaron if able to get  Continue PPI daily Agree with transfusion, needs at least 1u prior to procedure today. Trend H/H, transfuse for Hgb < 8 given cardiac history.  If evidence of chronic PUD on EGD then recommend indefinite PPI use Will need oral iron daily on  discharge until follow up with primary GI Further evaluation for possible cirrhosis as outpatient with primary GI    01/04/2024, 8:48 AM  Charmaine Melia, MSN, FNP-BC, AGACNP-BC [facility]

## 2024-01-04 NOTE — Transfer of Care (Signed)
 Immediate Anesthesia Transfer of Care Note  Patient: Stirling Orton.  Procedure(s) Performed: EGD (ESOPHAGOGASTRODUODENOSCOPY)  Patient Location: PACU  Anesthesia Type:General  Level of Consciousness: drowsy and patient cooperative  Airway & Oxygen Therapy: Patient Spontanous Breathing  Post-op Assessment: Report given to RN and Post -op Vital signs reviewed and stable  Post vital signs: Reviewed and stable  Last Vitals:  Vitals Value Taken Time  BP 109/60 01/04/24 14:45  Temp    Pulse 70 01/04/24 14:46  Resp 15 01/04/24 14:46  SpO2 98 % 01/04/24 14:46  Vitals shown include unfiled device data.  Last Pain:  Vitals:   01/04/24 1400  TempSrc: Oral  PainSc:          Complications: No notable events documented.

## 2024-01-04 NOTE — Progress Notes (Addendum)
 PROGRESS NOTE    Patient: Brent Ray.                            PCP: Meade Bigness, MD                    DOB: 1946/01/30            DOA: 01/02/2024 FMW:[id number]             DOS: 01/04/2024, 11:01 AM   LOS: 1 day   Date of Service: The patient was seen and examined on 01/04/2024  Subjective:   The patient was seen and examined this morning, stable no acute distress Drop in hemoglobin noted.  Reports improved chest pain, denies any shortness of breath improved cough Satting 99% on room air.  Brief nosebleed, but denies any active running nosebleed or swallowing blood  His hemoglobin dropped from 9.2-8.0, 7.0  this morning, on heparin  drip No active signs of bleeding  Was supposed to be transferred to [facility] for left heart cath, discontinued due to drop in hemoglobin needing GI evaluation and anemia workup.    Brief Narrative:    Brent Ray. is a 78 y.o. male with medical history significant for symptomatic bradycardia with pacemaker (upgraded to CRT-D), atrial fibrillation on Eliquis , frequent PVCs, mild to moderate CAD (from [facility] in 2017), and cardiomyopathy with EF 35 to 40%, moderate to severe mitral regurgitation, history of CVA, and rheumatoid arthritis who presents with chest pain.   Patient reports that he was in his usual state and sitting in his truck this afternoon when he developed pain in his central chest.  Pain occurs when he takes a deep breath and is associated with discomfort in his neck/throat.  He feels short of breath when experiencing this pain.  He took a nitroglycerin  at home without relief.    ED Course: Afebrile and saturating well on room air with normal HR and elevated BP.  Labs are most notable for creatinine 1.79, hemoglobin 9.2, troponin 24 x 2, and proBNP 9018.  CTA of the chest, abdomen, and pelvis is negative for acute findings though notable for small pericardial effusion.   Cardiology was consulted by the ED physician and the patient  was given 500 mL of LR, 1 inch nitroglycerin  ointment, 324 mg aspirin , and fentanyl .     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 On Eliquis , need to hold. Chronic systolic HF with accerelating angina and 20% PVC burden.  - Prior cath mild CAD in 2017 LM hey Dalia has got heart rate good  Macrocytic anemia -with anemia of iron deficiency, folate deficiency - Hgb is 9.2, down from 10.5 in June >>>> 8.0, 7.0, 7.1 -Pursuing with 2U PRBC blood transfusion on 01/04/2024 - GI consulted -Pending Hemoccult - Anemia workup including iron study completed, total iron 26, TIBC 185, percent sat 14, ferritin 794, folate less than 3.0  Iron deficiency/folate deficiency noted -also percent IV iron infusion, oral folate repletion  - Check anemia panel     Latest Ref Rng & Units 01/04/2024    8:05 AM 01/04/2024    3:34 AM 01/03/2024    3:41 AM  CBC  WBC 4.0 - 10.5 K/uL  3.6  6.1   Hemoglobin 13.0 - 17.0 g/dL 7.1  7.0  8.0   Hematocrit 39.0 - 52.0 % 21.5  21.3  25.0   Platelets 150 - 400 K/uL  128  178   Addendum 01/04/2024 s/p-EGD Finding diffuse touch friability,  gastric biopsy, normal duodenum bulb positive for gum ulcers, gastropathy Holding anticoagulation, PPI twice daily, avoiding NSAIDs, Carafate 3 times daily  Source of bleed per GI likely gum bleeding, gastropathy, epistaxis     Chronic HFrEF  - EF was 35-40% with moderated-severe MR and mild-moderate TR in June 2025  - Appears compensated  - Continue Lasix  and losartan     Atrial fibrillation  - Holding Eliquis   -audiology started heparin  drip -per cardiology request and epistaxis  Epistaxis Minimum,-resolved -Brief nosebleed this morning has stopped spontaneously -On Eliquis -on hold -Mild drop in hemoglobin noted, will monitor closely    PVCs; hx of NSVT  - Continue mexiletine    CKD 3B  - Appears close to baseline  - Renally-dose medications       Hx of CVA   - Continue Eliquis  and Crestor     RA  - Managed with methotrexate   - Continue Folate supplement   ----------------------------------------------------------------------------------------------------------------------------------------------- Nutritional status:  The patient's BMI is: Body mass index is 15.79 kg/m. I agree with the assessment and plan as outlined   ------------------------------------------------------------------------------------------------------------------------------------------------  DVT prophylaxis:     Code Status:   Code Status: Full Code  Family Communication: No family member present at bedside-  -Advance care planning has been discussed.   Admission status:   Status is: Observation The patient remains OBS appropriate and will d/c before 2 midnights.   Disposition: From  - home             Planning for discharge in 1-2 days   Procedures:   No admission procedures for hospital encounter.   Antimicrobials:  Anti-infectives (From admission, onward)    Start     Dose/Rate Route Frequency Ordered Stop   01/03/24 1200  azithromycin (ZITHROMAX) tablet 500 mg         500 mg Oral Daily 01/03/24 1110          Medication:   sodium chloride    Intravenous Once   azithromycin  500 mg Oral Daily   folic acid   1 mg Oral Daily   furosemide   20 mg Oral Daily   guaiFENesin-dextromethorphan  10 mL Oral Q8H   losartan   12.5 mg Oral QHS   methocarbamol   500 mg Oral TID   mexiletine  150 mg Oral BID   pantoprazole   40 mg Oral Daily   [START ON 01/06/2024] rosuvastatin   5 mg Oral Q Sun   senna-docusate  1 tablet Oral QHS    acetaminophen , alum & mag hydroxide-simeth, ondansetron  (ZOFRAN ) IV, oxyCODONE    Objective:   Vitals:   01/03/24  1304 01/03/24 1702 01/03/24 1936 01/04/24 0556  BP: (!) 105/50 127/72 129/61 121/69  Pulse: 71 70 (!) 54 69  Resp:   20 18  Temp: 98.4 F (36.9 C) 99.7 F (37.6 C) 98.7 F (37.1 C) 97.9 F (36.6 C)  TempSrc: Oral Oral Oral Oral  SpO2: 100% 100% 98% 99%  Weight:    47.1 kg  Height:        Intake/Output Summary (Last 24 hours) at 01/04/2024 1101 Last data filed at 01/04/2024 0300 Gross per 24 hour  Intake 106.02 ml  Output 250 ml  Net -143.98 ml   Filed Weights   01/03/24 0027 01/03/24 0446 01/04/24 0556  Weight: 51.4 kg 51.3 kg 47.1 kg     Physical examination:   General:  AAO x 3,  cooperative, no distress;   HEENT:  Normocephalic, PERRL, otherwise with in Normal limits   Neuro:  CNII-XII intact. , normal motor and sensation, reflexes intact   Lungs:   Clear to auscultation BL, Respirations unlabored,  No wheezes / crackles  Cardio:    S1/S2, RRR, No murmure, No Rubs or Gallops   Abdomen:  Soft, non-tender, bowel sounds active all four quadrants, no guarding or peritoneal signs.  Muscular  skeletal:  Limited exam -global generalized weaknesses - in bed, able to move all 4 extremities,   2+ pulses,  symmetric, No pitting edema  Skin:  Dry, warm to touch, negative for any Rashes,  Wounds: Please see nursing documentation    ------------------------------------------------------------------------------------------------------------------------------------------    LABs:     Latest Ref Rng & Units 01/04/2024    8:05 AM 01/04/2024    3:34 AM 01/03/2024    3:41 AM  CBC  WBC 4.0 - 10.5 K/uL  3.6  6.1   Hemoglobin 13.0 - 17.0 g/dL 7.1  7.0  8.0   Hematocrit 39.0 - 52.0 % 21.5  21.3  25.0   Platelets 150 - 400 K/uL  128  178       Latest Ref Rng & Units 01/04/2024    3:34 AM 01/03/2024    3:41 AM 01/02/2024    6:00 PM  CMP  Glucose 70 - 99 mg/dL 892  94  897   BUN 8 - 23 mg/dL 21  23  24    Creatinine 0.61 - 1.24 mg/dL 8.38  8.36  8.20   Sodium 135 - 145 mmol/L 136  139  140   Potassium 3.5 - 5.1 mmol/L 3.5  4.5  4.1   Chloride 98 - 111 mmol/L 105  107  106   CO2 22 - 32 mmol/L 20  20  20    Calcium  8.9 - 10.3 mg/dL 8.2  8.8  9.1   Total Protein 6.5 - 8.1 g/dL   6.6   Total Bilirubin 0.0 - 1.2 mg/dL   1.2   Alkaline Phos 38 - 126 U/L   67   AST 15 - 41 U/L   29   ALT 0 - 44 U/L   30        Micro Results No results found for this or any previous visit (from the past 240 hours).  Radiology Reports No results found.   SIGNED: Adriana DELENA Grams, MD, FHM. FAAFP. Jolynn Pack - [facility] Time spent - 55 min.  In seeing, evaluating and examining the patient. Reviewing medical records, labs, drawn plan of care. [facility],  Pager (please use [email or web address] to page/ text) Please use Epic Secure Chat for non-urgent communication (7AM-7PM)  If 7PM-7AM, please contact night-coverage [email or web address], 01/04/2024, 11:01 AM

## 2024-01-04 NOTE — Progress Notes (Signed)
 PHARMACY - ANTICOAGULATION CONSULT NOTE  Pharmacy Consult for heparin  Indication: ACS/Afib  Labs: Recent Labs    01/02/24 1800 01/03/24 0341 01/03/24 2029  HGB 9.2* 8.0*  --   HCT 28.6* 25.0*  --   PLT 232 178  --   APTT  --   --  121*  HEPARINUNFRC  --   --  >1.10*  CREATININE 1.79* 1.63*  --    Assessment: 78yo male supratherapeutic on heparin  with initial dosing while DOAC on hold; no infusion issues or signs of bleeding per RN.  Goal of Therapy:  aPTT 66-102 seconds   Plan:  Decrease heparin  infusion by 2 units/kg/hr to 650 units/hr. Check PTT in 8 hours.   Marvetta Dauphin, PharmD, BCPS 01/04/2024 2:30 AM

## 2024-01-04 NOTE — Op Note (Signed)
 [facility] Patient Name: Brent Ray Procedure Date: 01/04/2024 1:33 PM MRN: [id number] Date of Birth: 1945/06/01 Attending MD: Brent Ozell Hollingshead , MD, [id number] CSN: [id number] Age: 78 Admit Type: Inpatient Procedure:                Upper GI endoscopy Indications:              Occult blood in stool Providers:                Brent Ozell Hollingshead, MD, Madelin Hunter, RN, Dorcas Lenis, Technician Referring MD:              Medicines:                Monitored Anesthesia Care Complications:            No immediate complications. Estimated Blood Loss:     Estimated blood loss was minimal. Procedure:                Pre-Anesthesia Assessment:                           - Prior to the procedure, a History and Physical                            was performed, and patient medications and                            allergies were reviewed. The patient's tolerance of                            previous anesthesia was also reviewed. The risks                            and benefits of the procedure and the sedation                            options and risks were discussed with the patient.                            All questions were answered, and informed consent                            was obtained. Prior Anticoagulants: The patient has                            taken no anticoagulant or antiplatelet agents. ASA                            Grade Assessment: III - A patient with severe                            systemic disease. After reviewing the risks and  benefits, the patient was deemed in satisfactory                            condition to undergo the procedure.                           After obtaining informed consent, the endoscope was                            passed under direct vision. Throughout the                            procedure, the patient's blood pressure, pulse, and                             oxygen saturations were monitored continuously. The                            [id number] ([id number]) Upper was introduced through                            the mouth, and advanced to the second part of                            duodenum. The upper GI endoscopy was accomplished                            without difficulty. The patient tolerated the                            procedure well. Scope In: 2:19:40 PM Scope Out: 2:33:30 PM Total Procedure Duration: 0 hours 13 minutes 50 seconds  Findings:      Posterior gum ulcers noted in the oral cavity (see images)      The examined esophagus was normal. No varices.      Diffusely angry appearing gastric mucosa with touch friability present.       Scattered erosions. No ulcer or infiltrating process seen. Patent       pylorus.      The duodenal bulb and second portion of the duodenum were normal. 2       small gastric biopsies taken to assess for H. pylori. 360 clip x 1. and       Purastat applied to biopsy site to assure hemostasis. Impression:               - Normal esophagus. Abnormal gastric mucosa with                            erosions and erythema diffusely. Diffuse touch                            friability. Status post gastric biopsy                           - Normal duodenal bulb and second portion of the  duodenum.                           - Gum ulcers present.                           - Patient recently had deep cleaning of his gums.                            Very sore develop multiple ulcers. He has had                            recent epistaxis as well. I suspect drop in                            hemoglobin more from gastropathy and gum bleeding                            rather than anything else. Moderate Sedation:      Moderate (conscious) sedation was personally administered by an       anesthesia professional. The following parameters were monitored: oxygen       saturation, heart rate,  blood pressure, respiratory rate, EKG, adequacy       of pulmonary ventilation, and response to care. Recommendation:           -                           - Return patient to hospital ward for ongoing care.                            Stop all anticoagulation until stomach and gums                            have a chance to heal. Patient's wife tells me that                            he is going to a gum specialist in [place] on                            November 12.                           - Continue twice daily PPI. Avoid NSAIDs. Add                            Carafate suspension 3 times daily. Dysphagia to                            diet. Track H&H he received 1 unit of packed RBCs                            in preop today. Follow-up pathology.                           -  At patient request, I discussed with June (wife)                            [phone] at length. Questions answered Procedure Code(s):        --- Professional ---                           [phone], Esophagogastroduodenoscopy, flexible,                            transoral; diagnostic, including collection of                            specimen(s) by brushing or washing, when performed                            (separate procedure) Diagnosis Code(s):        --- Professional ---                           R19.5, Other fecal abnormalities CPT copyright 2022 American Medical Association. All rights reserved. The codes documented in this report are preliminary and upon coder review may  be revised to meet current compliance requirements. Brent HERO. Quayshaun Hubbert, MD Brent Ozell Hollingshead, MD 01/04/2024 2:57:23 PM This report has been signed electronically. Number of Addenda: 0

## 2024-01-04 NOTE — Progress Notes (Signed)
 PHARMACY - ANTICOAGULATION CONSULT NOTE  Pharmacy Consult for heparin  Indication: chest pain/ACS  Allergies  Allergen Reactions   Amiodarone Other (See Comments)    Severe joint pain and blindness   Flecainide  Other (See Comments)    Severe headaches, blurred vision   Tamsulosin Hives and Itching   Tamsulosin Hcl Hives, Itching, Dermatitis and Rash   Carvedilol  Other (See Comments)    Unknown   Dapagliflozin Diarrhea   Eplerenone Other (See Comments)    Unknown   Lisinopril  Nausea And Vomiting and Nausea Only   Metoprolol  Other (See Comments)    Headaches, dizziness   Sacubitril-Valsartan Nausea And Vomiting and Nausea Only   Spironolactone  Swelling and Other (See Comments)    Breast swelling, headaches   Statins Other (See Comments)    Leg cramps   Sulfasalazine Nausea Only    Patient Measurements: Height: 5' 8 (172.7 cm) Weight: 47.1 kg (103 lb 13.5 oz) IBW/kg (Calculated) : 68.4 HEPARIN  DW (KG): 51.4  Vital Signs: Temp: 97.9 F (36.6 C) (10/24 0556) Temp Source: Oral (10/24 0556) BP: 121/69 (10/24 0556) Pulse Rate: 69 (10/24 0556)  Labs: Recent Labs    01/02/24 1800 01/03/24 0341 01/03/24 2029 01/04/24 0334 01/04/24 0805 01/04/24 1033  HGB 9.2* 8.0*  --  7.0* 7.1*  --   HCT 28.6* 25.0*  --  21.3* 21.5*  --   PLT 232 178  --  128*  --   --   APTT  --   --  121*  --   --  84*  HEPARINUNFRC  --   --  >1.10*  --   --   --   CREATININE 1.79* 1.63*  --  1.61*  --   --     Estimated Creatinine Clearance: 26 mL/min (A) (by C-G formula based on SCr of 1.61 mg/dL (H)).   Medical History: Past Medical History:  Diagnosis Date   A-fib [facility])    AICD (automatic cardioverter/defibrillator) present    Barrett's esophagus 2011   In [place] . Records in Epic   CHF (congestive heart failure) (HCC)    Chronic kidney disease    Coronary atherosclerosis of native coronary artery    Nonobstructive   Essential hypertension, benign    GERD (gastroesophageal reflux  disease)    Heart block AV first degree    Chronic   History of kidney stones    Hypercholesterolemia    Mixed hyperlipidemia    NSVT (nonsustained ventricular tachycardia) (HCC)    Holter monitor, 2011; recommended RF ablation by Dr. Kelsie - never required   PAF (paroxysmal atrial fibrillation) [facility])    Recently documented in [facility] 2016   Pre-diabetes    Rheumatoid arthritis (HCC)    Sinus bradycardia    Symptomatic, acebutolol discontinued   Skin cancer    Sleep apnea    pt denies   Assessment: 78 year old male with known CAD presents to [facility] with chest pain. Patient with hsitory of afib on chronic apixaban . Last dose appears to be last night 10/22 pm. Apixaban  held this morning, new orders to transition to heparin  infusion with plans for possible cath.   APTT 84- therapeutic Hgb 7.1  Goal of Therapy:  Heparin  level 0.3-0.7 units/ml Monitor platelets by anticoagulation protocol: Yes   Plan:  Continue heparin  infusion at 650 units/hr aPTT in 8 hours and heparin /aPTT level daily Continue to monitor H&H and platelets  Elspeth Sour, PharmD Clinical Pharmacist 01/04/2024 12:35 PM

## 2024-01-04 NOTE — TOC Initial Note (Signed)
 Transition of Care (TOC) - Initial/Assessment Note    Patient Details  Name: Brent Ray. MRN: [id number] Date of Birth: [birth year]  Transition of Care [facility]) CM/SW Contact:    Sharlyne Stabs, RN Phone Number: 01/04/2024, 2:31 PM  Clinical Narrative:     Patient admitted with Chest pain, considered to be high risk for readmission. Patient lives at home with his wife. Has a rolling walker. No issues making his appointment. Discharging planning for tomorrow after endo. No needs at this time. IPCM following.               Expected Discharge Plan: Home/Self Care Barriers to Discharge: Continued Medical Work up   Patient Goals and CMS Choice Patient states their goals for this hospitalization and ongoing recovery are:: return home.          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) 01/04/2024   Presence of cardiac resynchronization therapy defibrillator (CRT-D) 01/04/2024   Long term (current) use of anticoagulants 01/04/2024   Severe mitral regurgitation 01/04/2024   Anemia 01/04/2024   Protein-calorie malnutrition, severe 01/04/2024   History of stroke 01/02/2024   Macrocytic anemia 01/02/2024   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   Internal carotid artery occlusion, 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   Dizziness 08/21/2023   Metabolic acidosis 07/31/2023   Macrocytosis 07/31/2023   Shortness of breath  07/31/2023   Rheumatism 07/31/2023   Peptic ulcer disease 07/31/2023   Dyslipidemia 07/31/2023   Preop cardiovascular exam 04/02/2023   AKI (acute kidney injury) 04/02/2023   Chronic systolic CHF (congestive heart failure) (HCC) 01/19/2023   Paroxysmal atrial flutter (HCC) 01/19/2023   Drug-induced gynecomastia 01/19/2023   Moderate mitral regurgitation 01/19/2023   Moderate tricuspid regurgitation 01/19/2023   History of GI bleed 01/19/2023   CKD (chronic kidney disease) stage 3, GFR 30-59 ml/min (HCC) 01/19/2023   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   Avascular necrosis of hip, 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/11/2022   Dysphagia 12/08/2019   Abdominal pain 12/08/2019   Rectal bleeding 12/08/2019   Persistent atrial fibrillation (HCC) 02/29/2016   History of panic attacks 02/29/2016   Second degree AV block 02/01/2016   D-dimer, elevated    Permanent atrial fibrillation (HCC)    NICM (nonischemic cardiomyopathy) (HCC)    Anticoagulated    Ischemic chest pain 12/13/2015   Fatigue 11/09/2014   Internal hemorrhoids 10/31/2013   Esophageal reflux 10/31/2013   CAD in native artery    Sinus bradycardia    Heart block AV first degree    NSVT (nonsustained ventricular tachycardia) (HCC)    Mixed hyperlipidemia    Bradycardia 08/21/2012   Chest pain 01/21/2012   PAF (paroxysmal atrial fibrillation) (HCC) 04/11/2010   PALPITATIONS 03/02/2010   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 [address]  [address] [place] [address] Phone: [phone] Fax: [phone]  [facility] [address] [place] [address] Phone: [phone] Fax: [phone]     Social Drivers of Health (SDOH) Social History: SDOH Screenings   Food Insecurity: No Food  Insecurity (01/03/2024)  Housing: Unknown (01/03/2024)  Transportation Needs: No Transportation Needs (01/03/2024)  Utilities: Not At Risk (01/03/2024)  Financial Resource Strain: Low Risk  (07/01/2021)   Received from [facility]  Social Connections: Moderately Integrated (01/03/2024)  Tobacco Use: Low Risk  (01/04/2024)   SDOH Interventions:   Readmission Risk Interventions    01/04/2024    2:30 PM  Readmission Risk Prevention Plan  Transportation Screening Complete  PCP or Specialist Appt within 3-5 Days Not Complete  HRI or Home Care Consult Complete  Social Work Consult for Recovery Care Planning/Counseling Complete  Palliative Care Screening Not Applicable  Medication Review Oceanographer) Complete

## 2024-01-04 NOTE — Anesthesia Preprocedure Evaluation (Signed)
 Anesthesia Evaluation  Patient identified by MRN, date of birth, ID band Patient awake    Reviewed: Allergy & Precautions, H&P , NPO status , Patient's Chart, lab work & pertinent test results, reviewed documented beta blocker date and time   Airway Mallampati: II  TM Distance: >3 FB Neck ROM: full    Dental no notable dental hx.    Pulmonary shortness of breath, sleep apnea    Pulmonary exam normal breath sounds clear to auscultation       Cardiovascular Exercise Tolerance: Good hypertension, + CAD and +CHF  + dysrhythmias Atrial Fibrillation + Cardiac Defibrillator  Rhythm:regular Rate:Normal     Neuro/Psych negative neurological ROS  negative psych ROS   GI/Hepatic Neg liver ROS, PUD,GERD  ,,  Endo/Other  negative endocrine ROS    Renal/GU Renal disease  negative genitourinary   Musculoskeletal   Abdominal   Peds  Hematology  (+) Blood dyscrasia, anemia   Anesthesia Other Findings   Reproductive/Obstetrics negative OB ROS                              Anesthesia Physical Anesthesia Plan  ASA: 4 and emergent  Anesthesia Plan: General   Post-op Pain Management:    Induction:   PONV Risk Score and Plan: Propofol  infusion  Airway Management Planned:   Additional Equipment:   Intra-op Plan:   Post-operative Plan:   Informed Consent: I have reviewed the patients History and Physical, chart, labs and discussed the procedure including the risks, benefits and alternatives for the proposed anesthesia with the patient or authorized representative who has indicated his/her understanding and acceptance.     Dental Advisory Given  Plan Discussed with: CRNA  Anesthesia Plan Comments:         Anesthesia Quick Evaluation

## 2024-01-04 NOTE — Progress Notes (Addendum)
 Initial Nutrition Assessment  DOCUMENTATION CODES:   Severe malnutrition in context of chronic illness, Underweight  INTERVENTION:   When able to advance diet post procedure recommend Dysphagia 3 diet with thin liquids (mechanical soft). Begin PO supplements when diet is advanced: Ensure Plus High Protein po TID, each supplement provides 350 kcal and 20 grams of protein. Magic cup TID with meals, each supplement provides 290 kcal and 9 grams of protein.  NUTRITION DIAGNOSIS:   Severe Malnutrition related to chronic illness (heart failure) as evidenced by severe muscle depletion, severe fat depletion, percent weight loss (20% weight loss x 6 months).  GOAL:   Patient will meet greater than or equal to 90% of their needs  MONITOR:   Diet advancement, PO intake  REASON FOR ASSESSMENT:   Rounds    ASSESSMENT:   78 yo male admitted with chest pain r/t anemia. PMH includes chronic HF, HTN, NSVT, heart block, HLD, Barrett's esophagus, PAF, CKD, Rheumatoid arthritis, AICD.  Spoke with patient and his wife at bedside. Patient has been eating poorly for several months. Over the past 3-4 weeks, he has had mouth sores and has been requiring very soft foods like mashed potatoes. He recently had cleaning of his gums and now has several mouth sores, which make it difficult to chew. He tolerates liquids okay. He drinks Ensure supplements at home sometimes. He endorses a lot of weight loss.   Currently NPO for EGD today after he receives a blood transfusion.  Weight history reviewed: 20% weight loss over the past 6 months 10% weight loss over the past 3 months 8% weight loss over the past 1 month Weight loss could be affected by fluid status with hx of HF.  Labs reviewed.  Hgb 7.1 Iron 26 Folate < 3 CRP 12.7 Vitamin B12 814  Medications reviewed and include ferrous sulfate, folic acid , lasix , protonix , senokot-s, IV ferric gluconate.  Patient meets criteria for severe  malnutrition, given severe depletion of muscle and subcutaneous fat mass.  NUTRITION - FOCUSED PHYSICAL EXAM:  Flowsheet Row Most Recent Value  Orbital Region Moderate depletion  Upper Arm Region Severe depletion  Thoracic and Lumbar Region Severe depletion  Buccal Region Unable to assess  [swelling and pain]  Temple Region Moderate depletion  Clavicle Bone Region Moderate depletion  Clavicle and Acromion Bone Region Severe depletion  Scapular Bone Region Severe depletion  Dorsal Hand Severe depletion  Patellar Region Severe depletion  Anterior Thigh Region Severe depletion  Posterior Calf Region Severe depletion  Edema (RD Assessment) None  Hair Reviewed  Eyes Reviewed  Mouth Other (Comment)  [lots of sores in mouth]  Skin Reviewed  Nails Reviewed    Diet Order:   Diet Order     None       EDUCATION NEEDS:   Not appropriate for education at this time  Skin:  Skin Assessment: Reviewed RN Assessment  Last BM:  10/24  Height:   Ht Readings from Last 1 Encounters:  01/03/24 5' 8 (1.727 m)    Weight:   Wt Readings from Last 1 Encounters:  01/04/24 47.1 kg    Ideal Body Weight:  70 kg  BMI:  Body mass index is 15.79 kg/m.  Estimated Nutritional Needs:   Kcal:  1500-1700  Protein:  75-85 gm  Fluid:  1.5-1.7 L   Suzen HUNT RD, LDN, CNSC Contact via secure chat. If unavailable, use group chat RD Inpatient.

## 2024-01-04 NOTE — Progress Notes (Signed)
 Progress Note  Patient Name: Brent Ray. Date of Encounter: 01/04/2024  Primary Cardiologist: Darlinda Bellows P Dvora Buitron, MD  Subjective   LHC/RHC canceled today due to rapidly declining hemoglobin.  His symptoms of chest pain and DOE likely secondary to anemia.  Has no other symptoms.  Inpatient Medications    Scheduled Meds:  sodium chloride    Intravenous Once   azithromycin  500 mg Oral Daily   folic acid   1 mg Oral Daily   furosemide   20 mg Oral Daily   guaiFENesin-dextromethorphan  10 mL Oral Q8H   losartan   12.5 mg Oral QHS   magnesium citrate  1 Bottle Oral Once   methocarbamol   500 mg Oral TID   mexiletine  150 mg Oral BID   pantoprazole   40 mg Oral Daily   [START ON 01/06/2024] rosuvastatin   5 mg Oral Q Sun   senna-docusate  1 tablet Oral QHS   Continuous Infusions:  heparin  650 Units/hr (01/04/24 0233)   PRN Meds: acetaminophen , alum & mag hydroxide-simeth, ondansetron  (ZOFRAN ) IV, oxyCODONE    Vital Signs    Vitals:   01/03/24 1304 01/03/24 1702 01/03/24 1936 01/04/24 0556  BP: (!) 105/50 127/72 129/61 121/69  Pulse: 71 70 (!) 54 69  Resp:   20 18  Temp: 98.4 F (36.9 C) 99.7 F (37.6 C) 98.7 F (37.1 C) 97.9 F (36.6 C)  TempSrc: Oral Oral Oral Oral  SpO2: 100% 100% 98% 99%  Weight:    47.1 kg  Height:        Intake/Output Summary (Last 24 hours) at 01/04/2024 1021 Last data filed at 01/04/2024 0300 Gross per 24 hour  Intake 106.02 ml  Output 250 ml  Net -143.98 ml   Filed Weights   01/03/24 0027 01/03/24 0446 01/04/24 0556  Weight: 51.4 kg 51.3 kg 47.1 kg    Telemetry     Personally reviewed.  V paced rhythm with underlying A-fib  ECG    Not performed today.  Physical Exam   GEN: No acute distress.   Neck: No JVD. Cardiac: RRR, no murmur, rub, or gallop.  Respiratory: Nonlabored. Clear to auscultation bilaterally. GI: Soft, nontender, bowel sounds present. MS: No edema; No deformity. Neuro:  Nonfocal. Psych: Alert and  oriented x 3. Normal affect.  Labs    Chemistry Recent Labs  Lab 01/02/24 1800 01/03/24 0341 01/04/24 0334  NA 140 139 136  K 4.1 4.5 3.5  CL 106 107 105  CO2 20* 20* 20*  GLUCOSE 102* 94 107*  BUN 24* 23 21  CREATININE 1.79* 1.63* 1.61*  CALCIUM  9.1 8.8* 8.2*  PROT 6.6  --   --   ALBUMIN  4.0  --   --   AST 29  --   --   ALT 30  --   --   ALKPHOS 67  --   --   BILITOT 1.2  --   --   GFRNONAA 39* 43* 44*  ANIONGAP 14 12 11      Hematology Recent Labs  Lab 01/02/24 1800 01/03/24 0341 01/04/24 0334 01/04/24 0805  WBC 7.2 6.1 3.6*  --   RBC 2.69* 2.34*  2.38* 2.04*  --   HGB 9.2* 8.0* 7.0* 7.1*  HCT 28.6* 25.0* 21.3* 21.5*  MCV 106.3* 106.8* 104.4*  --   MCH 34.2* 34.2* 34.3*  --   MCHC 32.2 32.0 32.9  --   RDW 16.2* 16.1* 16.1*  --   PLT 232 178 128*  --  Cardiac EnzymesNo results for input(s): TROPONINIHS in the last 720 hours.  BNP Recent Labs  Lab 01/02/24 1800  PROBNP 9,018.0*     DDimerNo results for input(s): DDIMER in the last 168 hours.    Assessment & Plan   Severe anemia: Hb gradually dropping, 7 this a.m.  Baseline is around 10 to 12, has been gradually dropping since June 2025.  He underwent colonoscopy in March 2025 in [place] that was normal except for internal hemorrhoids according to the patient.  I do not have the op report to review.  Keep hemoglobin more than 8 due to complicated cardiac history.  GI on board.  Consider holding heparin  drip.   Chronic systolic heart failure/HFimpEF: LVEF 35 to 40% in 2025.  Repeat echocardiogram this admission showed LVEF 55%.  Compensated.  Continue p.o. Lasix  20 mg once daily.  Continue losartan  12.5 mg once daily.  Did not tolerate any other GDMT.  Follows up with advanced heart failure.  Not a candidate for advanced heart failure therapies.  Frequent PVCs: Improved from 20% to 2% after starting mexiletine.  Did not tolerate any beta-blockers.  Initially plan was to schedule him for cath  [facility] and RHC) today due to prior frequent PVC burden and accelerating angina but angina/DOE likely secondary to severe anemia.  Plan for severe anemia, as above.  Moderate to severe MR: Echocardiogram this admission showed moderate to severe MR.  Mild prolapse of posterior leaflet of mitral valve was noted.  He will need outpatient T[facility] at [facility].  Compensated currently.   Permanent A-fib s/p multiple ablations: He had PVI as well as vein of [place] ablation.  Previously on Coumadin, switched to Xarelto  prior to establishing care with me.  But due to recent ICA dissection (from a fall) resulting in TIA/CVA, neurology switched Xarelto  to Eliquis  5 mg daily.  Eliquis  switched to heparin  drip yesterday due to plans for LHC.  Consider holding heparin  drip due to rapidly declining hemoglobin.  He presented this admission for severe anemia.  He will benefit from outpatient structural heart referral for watchman implantation.   Mitral annular flutter s/p ablation: No recurrence of flutter.  Monitor.   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  [facility] will sign off.   Medication Recommendations: Continue current medications Other recommendations (labs, testing, etc): Outpatient structural cardiology referral for watchman implantation Follow up as an outpatient: Follow-up in 6 weeks in cardiology clinic   Signed, Diannah SHAUNNA Maywood, MD  01/04/2024, 10:21 AM

## 2024-01-05 DIAGNOSIS — R079 Chest pain, unspecified: Secondary | ICD-10-CM | POA: Diagnosis not present

## 2024-01-05 DIAGNOSIS — K922 Gastrointestinal hemorrhage, unspecified: Secondary | ICD-10-CM

## 2024-01-05 LAB — BPAM RBC
Blood Product Expiration Date: 202511162359
Blood Product Expiration Date: 202511162359
ISSUE DATE / TIME: 202510241259
ISSUE DATE / TIME: 202510241609
Unit Type and Rh: 5100
Unit Type and Rh: 5100

## 2024-01-05 LAB — TYPE AND SCREEN
ABO/RH(D): O POS
Antibody Screen: NEGATIVE
Unit division: 0
Unit division: 0

## 2024-01-05 LAB — CBC
HCT: 36.3 % — ABNORMAL LOW (ref 39.0–52.0)
Hemoglobin: 12.4 g/dL — ABNORMAL LOW (ref 13.0–17.0)
MCH: 32.5 pg (ref 26.0–34.0)
MCHC: 34.2 g/dL (ref 30.0–36.0)
MCV: 95.3 fL (ref 80.0–100.0)
Platelets: 111 K/uL — ABNORMAL LOW (ref 150–400)
RBC: 3.81 MIL/uL — ABNORMAL LOW (ref 4.22–5.81)
RDW: 19.1 % — ABNORMAL HIGH (ref 11.5–15.5)
WBC: 7.4 K/uL (ref 4.0–10.5)
nRBC: 0.4 % — ABNORMAL HIGH (ref 0.0–0.2)

## 2024-01-05 NOTE — Anesthesia Postprocedure Evaluation (Signed)
 Anesthesia Post Note  Patient: Brent Ray.  Procedure(s) Performed: EGD (ESOPHAGOGASTRODUODENOSCOPY)  Patient location during evaluation: Phase II Anesthesia Type: General Level of consciousness: awake Pain management: pain level controlled Vital Signs Assessment: post-procedure vital signs reviewed and stable Respiratory status: spontaneous breathing and respiratory function stable Cardiovascular status: blood pressure returned to baseline and stable Postop Assessment: no headache and no apparent nausea or vomiting Anesthetic complications: no Comments: Late entry   No notable events documented.   Last Vitals:  Vitals:   01/04/24 1926 01/05/24 0343  BP: 126/70 127/73  Pulse: 70 70  Resp: 18 18  Temp: 36.7 C 36.6 C  SpO2: 100% 98%    Last Pain:  Vitals:   01/05/24 0953  TempSrc:   PainSc: 3                  Yvonna JINNY Bosworth

## 2024-01-05 NOTE — Progress Notes (Signed)
 PROGRESS NOTE  Brent Ray. FMW:[id number] DOB: May 15, 1945 DOA: 01/02/2024 PCP: Meade Bigness, MD   LOS: 2 days   Brief narrative:   Brent Ray. is a 78 y.o. 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  Hospital with chest pain with some pleuritic component and shortness of breath.  He took nitroglycerin  without any relief.  In the ED patient was noted to have creatinine elevation at 1.7 troponins were 24 followed by 24 with BNP of 9018.    CTA of the chest, abdomen, and pelvis was negative for acute findings though notable for small pericardial effusion.  Cardiology was consulted from the ED and patient was admitted hospital for further evaluation and treatment.    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  Thought process was chronic systolic heart failure with unstable angina with 20% PVC burden.  Patient did have previous catheterization in 2017 with mild CAD.  No further episode of chest pain after admission..  Eliquis  on hold.  Patient was initially on  heparin  drip.  Troponins were 24 followed by 24.  CTA of the chest showed pericardial effusion but no other findings.  2D echocardiogram showed LV ejection fraction of 55%.  Initial plan was to transfer to [facility] for left heart and right heart catheterization but due to bleeding currently on hold.  Symptoms were thought to be secondary to severe anemia.    Macrocytic anemia -with anemia of iron deficiency, folate deficiency Hemoglobin down up to 7.1 during hospitalization from 10.5 in June 2025.  Received 2 units of packed RBC.  GI was consulted and underwent upper GI endoscopy on 01/04/2024 with findings of oral cavity bleeding gastropathy.  At this time patient is off Eliquis .  Recommendation is against NSAIDs.  Patient will continue PPI twice daily and Carafate 3 times daily.  Anemia workup done showed total iron 26, TIBC 185, percent sat 14, ferritin 794, folate less than 3.0.  Continue folate supplementation.  Received IV iron infusion and 2 units of packed RBC.  Hemoglobin today at 12.4.  Will need to repeat for accuracy.  Oral cavity ulceration.  Patient was supposed to follow-up with tertiary center for this.  Will add Peridex  for now.   Chronic HFrEF  Moderate to severe MR. Last known 2D echocardiogram with LV EF of 35-40% with moderated-severe MR and mild-moderate TR in June 2025.  Currently compensated.  Continue Lasix  and losartan     Paroxysmal atrial fibrillation  History of multiple ablations in the past. Eliquis  on hold.  GI does not recommend anticoagulation until next GI visit.  Cardiology recommend outpatient  structural cardiology referral for Watchman device.  Follow-up with cardiology in 6 weeks in the clinic.   Epistaxis Resolved.  Eliquis  on hold.  PVCs. - Continue mexiletine    CKD 3B  Likely at baseline.  Latest creatinine of 1.6.  Will continue to monitor.    Hx of CVA   Patient is on Eliquis  and Crestor  as outpatient.  Currently off Eliquis  and GI recommends  against it until next GI visit due to high risk of bleeding.   Rheumatoid arthritis. Managed with methotrexate .  Continue folate supplementation.  DVT prophylaxis: Add SCDs   Disposition: Home likely in 1 to 2 days  Status is: Inpatient Remains inpatient appropriate because: Closer monitoring, anemia,    Code Status:     Code Status: Full Code  Family Communication: Spoke with the patient's family at bedside.  Consultants: Cardiology GI  Procedures: Upper GI endoscopy  Anti-infectives:  Azithromycin  Anti-infectives (From admission, onward)    Start     Dose/Rate Route Frequency Ordered Stop   01/03/24 1200  azithromycin (ZITHROMAX) tablet 500 mg        500 mg Oral Daily 01/03/24 1110          Subjective: Today, patient was seen and examined at bedside.  Patient states that he does have some oral ulceration but denied any bright red blood/black stools.  Denies any abdominal pain nausea vomiting.  He did have an episode of back pain yesterday with hematuria which has subsided.  Suspected that he might have passed the stone.  Objective: Vitals:   01/04/24 1926 01/05/24 0343  BP: 126/70 127/73  Pulse: 70 70  Resp: 18 18  Temp: 98 F (36.7 C) 97.8 F (36.6 C)  SpO2: 100% 98%    Intake/Output Summary (Last 24 hours) at 01/05/2024 1249 Last data filed at 01/05/2024 0500 Gross per 24 hour  Intake 2154.9 ml  Output 625 ml  Net 1529.9 ml   Filed Weights   01/03/24 0446 01/04/24 0556 01/05/24 0449  Weight: 51.3 kg 47.1 kg 52.8 kg   Body mass index is 17.7 kg/m.   Physical Exam: GENERAL: Patient is alert awake and oriented. Not in obvious distress.  Thinly built, elderly male, appears weak and deconditioned. HENT: No scleral pallor or icterus. Pupils equally reactive to light. Oral mucosa is moist.  Bilateral buccal cavity with erythema and ulceration. NECK: is supple, no gross swelling noted. CHEST: Decreased breath sounds bilaterally. CVS: S1 and S2  heard, no murmur. Regular rate and rhythm.  ABDOMEN: Soft, non-tender, bowel sounds are present. EXTREMITIES: No edema. CNS: Cranial nerves are intact. No focal motor deficits. SKIN: warm and dry, multiple bruises noted over the skin.  Data Review: I have personally reviewed the following laboratory data and studies,  CBC: Recent Labs  Lab 01/02/24 1800 01/03/24 0341 01/04/24 0334 01/04/24 0805 01/05/24 0331  WBC 7.2 6.1 3.6*  --  7.4  NEUTROABS 6.9  --   --   --   --   HGB 9.2* 8.0* 7.0* 7.1* 12.4*  HCT 28.6* 25.0* 21.3* 21.5* 36.3*  MCV 106.3* 106.8* 104.4*  --  95.3  PLT 232 178 128*  --  111*   Basic Metabolic Panel: Recent Labs  Lab 01/02/24 1800 01/03/24 0341 01/04/24 0334  NA 140 139 136  K 4.1 4.5 3.5  CL 106 107 105  CO2 20* 20* 20*  GLUCOSE 102* 94 107*  BUN 24* 23 21  CREATININE 1.79* 1.63* 1.61*  CALCIUM  9.1 8.8*  8.2*   Liver Function Tests: Recent Labs  Lab 01/02/24 1800  AST 29  ALT 30  ALKPHOS 67  BILITOT 1.2  PROT 6.6  ALBUMIN  4.0   No results for input(s): LIPASE, AMYLASE in the last 168 hours. No results for input(s): AMMONIA in the last 168 hours. Cardiac Enzymes: No results for input(s): CKTOTAL, CKMB, CKMBINDEX, TROPONINI in the last 168 hours. BNP (last 3 results) Recent Labs    07/31/23 1440 09/21/23 1222 12/14/23 1100  BNP 1,699.0* 1,036.0* 399.8*    ProBNP (last 3 results) Recent Labs    01/02/24 1800  PROBNP 9,018.0*    CBG: No results for input(s): GLUCAP in the last 168 hours. No results found for this or any previous visit (from the past 240 hours).   Studies: No results found.    Gola Bribiesca, MD  [facility] 01/05/2024  If 7PM-7AM, please contact night-coverage

## 2024-01-05 NOTE — Progress Notes (Incomplete)
 Patient was complaining of not being able to void earlier in the night. But he had voided several times just not a lot.  He was bladder scanned with minimal urine noted.  He just passed a clot that was a couple inches long.  Now patient is voiding fine again. Hospitalist on call notified. No new orders.

## 2024-01-05 NOTE — Progress Notes (Signed)
 Patient feels good this morning.  States he had trouble with a kidney stone overnight; he states he passed it with some gross blood in his urine.  No hematemesis, melena or hematochezia.  Denies abdominal pain.  Hemoglobin 12.4 this morning after reportedly getting 2 units yesterday for hemoglobin of 7.4   Vital signs in last 24 hours: Temp:  [97.5 F (36.4 C)-99.1 F (37.3 C)] 97.8 F (36.6 C) (10/25 0343) Pulse Rate:  [69-71] 70 (10/25 0343) Resp:  [14-20] 18 (10/25 0343) BP: (109-159)/(60-88) 127/73 (10/25 0343) SpO2:  [97 %-100 %] 98 % (10/25 0343) Weight:  [52.8 kg] 52.8 kg (10/25 0449) Last BM Date : 01/02/24 General:   Alert,  Well-developed, well-nourished, pleasant and cooperative in NAD.  Accompanied by his wife. Abdomen: Abdomen is soft and nontender  Intake/Output from previous day: 10/24 0701 - 10/25 0700 In: 2154.9 [P.O.:680; I.V.:643.7; Blood:831.2] Out: 1125 [Urine:1125] Intake/Output this shift: No intake/output data recorded.  Lab Results: Recent Labs    01/03/24 0341 01/04/24 0334 01/04/24 0805 01/05/24 0331  WBC 6.1 3.6*  --  7.4  HGB 8.0* 7.0* 7.1* 12.4*  HCT 25.0* 21.3* 21.5* 36.3*  PLT 178 128*  --  111*   BMET Recent Labs    01/02/24 1800 01/03/24 0341 01/04/24 0334  NA 140 139 136  K 4.1 4.5 3.5  CL 106 107 105  CO2 20* 20* 20*  GLUCOSE 102* 94 107*  BUN 24* 23 21  CREATININE 1.79* 1.63* 1.61*  CALCIUM  9.1 8.8* 8.2*   LFT Recent Labs    01/02/24 1800  PROT 6.6  ALBUMIN  4.0  AST 29  ALT 30  ALKPHOS 67  BILITOT 1.2    Impression: Pleasant 78 year old gentleman from [place]  admitted to the hospital with chest pain and prominent anemia.  Cardiology evaluation this admission.  Felt not to have active coronary disease EF improved.  Chronically anticoagulated on Eliquis  for atrial fibrillation. EGD yesterday revealed a markedly raw friable gastric mucosa. no gastric ulcer but he did have gum ulcers. no esophageal varices.  Small  biopsies obtained for H. pylori. Since  colonoscopy out of state reportedly negative, we have completed his inpatient evaluation. Recent GI bleed likely multifactorial but arising from his upper GI tract as described.  Recommendations:  - Continue Carafate suspension 3 times daily  - Continue PPI twice daily.  - Further assessment of oral lesions ASAP as has been arranged  -Would strongly recommend he follow-up with Dr. Garlan (primary GI) and no later than 2 weeks from now  -Risk of recurrent bleeding resuming Eliquis  is substantial.  Would hold it until he sees his primary GI in 2 weeks for further recommendations  -Follow-up on pending biopsy results  -Avoid all NSAIDs  -Diet as tolerated  - Signing off.  Thank you for allowing me to see this nice gentleman.

## 2024-01-06 DIAGNOSIS — R079 Chest pain, unspecified: Secondary | ICD-10-CM | POA: Diagnosis not present

## 2024-01-06 LAB — CBC
HCT: 33.8 % — ABNORMAL LOW (ref 39.0–52.0)
Hemoglobin: 11.5 g/dL — ABNORMAL LOW (ref 13.0–17.0)
MCH: 33.2 pg (ref 26.0–34.0)
MCHC: 34 g/dL (ref 30.0–36.0)
MCV: 97.7 fL (ref 80.0–100.0)
Platelets: 104 K/uL — ABNORMAL LOW (ref 150–400)
RBC: 3.46 MIL/uL — ABNORMAL LOW (ref 4.22–5.81)
RDW: 18.7 % — ABNORMAL HIGH (ref 11.5–15.5)
WBC: 4.5 K/uL (ref 4.0–10.5)
nRBC: 0 % (ref 0.0–0.2)

## 2024-01-06 MED ORDER — SUCRALFATE 1 GM/10ML PO SUSP: 1.0000 g | Freq: Three times a day (TID) | ORAL | 0 refills | Status: DC

## 2024-01-06 MED ORDER — SENNOSIDES-DOCUSATE SODIUM 8.6-50 MG PO TABS: 2.0000 | ORAL_TABLET | Freq: Every day | ORAL | 0 refills | Status: AC

## 2024-01-06 MED ORDER — FERROUS SULFATE 325 (65 FE) MG PO TABS: 325.0000 mg | ORAL_TABLET | Freq: Two times a day (BID) | ORAL | 0 refills | Status: DC

## 2024-01-06 MED ORDER — PANTOPRAZOLE SODIUM 40 MG PO TBEC: 40.0000 mg | DELAYED_RELEASE_TABLET | Freq: Two times a day (BID) | ORAL | 2 refills | Status: AC

## 2024-01-06 NOTE — Discharge Summary (Signed)
 Physician Discharge Summary  Brent Ray. FMW:[id number] DOB: July 03, 1945 DOA: 01/02/2024  PCP: Brent Bigness, MD  Admit date: 01/02/2024 Discharge date: 01/06/2024  Admitted From: Home  Discharge disposition: Home   Recommendations for Outpatient Follow-Up:   Follow up with your primary care provider in one week.  Check CBC, BMP, magnesium in the next visit Follow-up with your primary GI Dr. Garlan in 1 to 2 weeks to discuss about blood thinners. Follow-up with your cardiologist to discuss about Watchman device in the future.   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  Diet recommendation: Soft diet  Wound care: None.  Code status: Full.   History of Present Illness:   Brent Ray. is a 78 y.o. 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  Hospital with chest pain with some pleuritic component and shortness of breath.  He took nitroglycerin  without any relief.  In the ED patient was noted to have creatinine elevation at 1.7 troponins were 24  followed by 24 with BNP of 9018.    CTA of the chest, abdomen, and pelvis was negative for acute findings though notable for small pericardial effusion.  Cardiology was consulted from the ED and patient was admitted hospital for further evaluation and treatment.   Hospital Course:   Following conditions were addressed during hospitalization as listed below,  Chest pain Denies chest pain at this time.   Patient did have previous catheterization in 2017 with mild CAD.  No further episode of chest pain after admission..  Eliquis  was kept on hold.  Patient was initially on heparin  drip.  Troponins were 24 followed by 24.  CTA of the chest showed pericardial effusion but no other findings.  2D echocardiogram showed LV ejection fraction of 55%.  Initial plan was to transfer to [facility] for left heart and right heart catheterization but due to bleeding it was canceled.  Subsequently cardiology has an impression of chest pain due to anemia.  No further workup planned at this time.  Patient will need to follow-up with cardiology as outpatient and discuss about Watchman device in the future.   Macrocytic anemia -with anemia of iron deficiency, folate deficiency Hemoglobin down up to 7.1 during hospitalization from 10.5 in June 2025.  Received 2 units of packed RBC.  GI was consulted and underwent upper GI endoscopy on 01/04/2024 with findings of oral cavity bleeding gastropathy. Patient will be taken off Eliquis  on discharge..  Recommendation is against NSAIDs.  Patient will continue PPI twice daily and Carafate 3 times daily.  Anemia workup done showed total iron 26, TIBC 185, percent sat 14,  ferritin 794, folate less than 3.0.  Continue folate supplementation.  Received IV iron infusion and 2 units of packed RBC.  Hemoglobin today at 11.5 from 12.4.    Oral cavity ulceration.  Patient was supposed to follow-up with tertiary center for this.  Holding up with Eliquis  to prevent bleeding.   Chronic HFrEF   Moderate to severe MR. Last known 2D echocardiogram with LV EF of 35-40% with moderated-severe MR and mild-moderate TR in June 2025.  Currently compensated.  Continue Lasix  and losartan  on discharge.   Paroxysmal atrial fibrillation  History of multiple ablations in the past. Eliquis  on hold.  GI does not recommend anticoagulation until next GI visit.  Cardiology recommend outpatient structural cardiology referral for Watchman device.  Follow-up with cardiology in 2 weeks to discuss about it.  Epistaxis Resolved.  Eliquis  on hold.  Plan is to hold until next GI visit.   PVCs. - Continue mexiletine on discharge   CKD 3B  Likely at baseline.  Latest creatinine of 1.6.      Hx of CVA   Patient is on Eliquis  and Crestor  as outpatient.  Currently off Eliquis  and GI recommends against it until next GI visit due to high risk of bleeding.   Rheumatoid arthritis. Managed with methotrexate .  Continue folate supplementation.  Disposition.  At this time, patient is stable for disposition home with outpatient PCP, cardiology and GI follow-up.  Medical Consultants:   GI Cardiology  Procedures:    Upper GI endoscopy  Subjective:   Today, patient was seen and examined at bedside.  Denies any nausea, vomiting, fever, chest pain, dyspnea.  Feels okay about going home.  Discharge Exam:   Vitals:   01/05/24 2018 01/06/24 0346  BP: 131/76 132/84  Pulse: 71 69  Resp:    Temp: 97.8 F (36.6 C) 98.2 F (36.8 C)  SpO2: 100% 100%   Vitals:   01/05/24 0449 01/05/24 1259 01/05/24 2018 01/06/24 0346  BP:  125/70 131/76 132/84  Pulse:  69 71 69  Resp:  15    Temp:  (!) 97.5 F (36.4 C) 97.8 F (36.6 C) 98.2 F (36.8 C)  TempSrc:  Oral Oral Oral  SpO2:  100% 100% 100%  Weight: 52.8 kg   51.7 kg  Height:       Body mass index is 17.33 kg/m.  General: Alert awake, not in obvious distress, thinly built, HENT: pupils equally reacting to light,  No scleral pallor or icterus noted. Oral  mucosa is moist.  Chest: Diminished breath sounds bilaterally, coarse breath sounds CVS: S1 &S2 heard. No murmur.  Regular rate and rhythm. Abdomen: Soft, nontender, nondistended.  Bowel sounds are heard.   Extremities: No cyanosis, clubbing or edema.  Peripheral pulses are palpable. Psych: Alert, awake and oriented, normal mood CNS:  No cranial nerve deficits.  Power equal in all extremities.   Skin: Warm and dry.  Multiple bruises in the skin.    The results of significant diagnostics from this hospitalization (including imaging, microbiology, ancillary and laboratory) are listed below for reference.     Diagnostic Studies:   ECHOCARDIOGRAM COMPLETE Result Date: 01/03/2024    ECHOCARDIOGRAM REPORT   Patient Name:   Brent Ray. Date of Exam: 01/03/2024 Medical Rec #:  [id number]            Height:       68.0 in Accession #:    [id number]           Weight:  113.1 lb Date of Birth:  1945-04-29            BSA:          1.604 m Patient Age:    78 years             BP:           141/87 mmHg Patient Gender: M                    HR:           77 bpm. Exam Location:  [facility] Procedure: 2D Echo, Cardiac Doppler and Color Doppler (Both Spectral and Color            Flow Doppler were utilized during procedure). Indications:    Chest Pain R07.9, Pericardial effusion I31.3  History:        Patient has prior history of Echocardiogram examinations, most                 recent 08/22/2023. CAD, Stroke, Arrythmias:Atrial Flutter, PVC                 and Atrial Fibrillation, Signs/Symptoms:Chest Pain, Fatigue and                 Shortness of Breath; Risk Factors:Hypertension and Dyslipidemia.                 H/O Chronic kidney disease, Second degree AV block,                 Hyperlipidemia.  Sonographer:    BERNARDA ROCKS Referring Phys: [id number] TIMOTHY S OPYD IMPRESSIONS  1. Left ventricular ejection fraction, by estimation, is 55%. The left ventricle has normal function. Left ventricular endocardial  border not optimally defined to evaluate regional wall motion. Left ventricular diastolic function could not be evaluated.  2. Right ventricular systolic function is normal. The right ventricular size is normal. There is normal pulmonary artery systolic pressure.  3. Left atrial size was severely dilated.  4. Right atrial size was severely dilated.  5. The mitral valve is abnormal. Moderate to severe mitral valve regurgitation. No evidence of mitral stenosis. There is mild prolapse of posterior leaflet of the mitral valve. The mean mitral valve gradient is 1.0 mmHg.  6. Two TR jets seen, mild in intensity.  7. The aortic valve is tricuspid. Aortic valve regurgitation is not visualized. No aortic stenosis is present. Aortic valve mean gradient measures 2.0 mmHg.  8. The inferior vena cava is normal in size with greater than 50% respiratory variability, suggesting right atrial pressure of 3 mmHg. Comparison(s): Changes from prior study are noted. LVEF improved from 35-40% to 55% now. FINDINGS  Left Ventricle: Left ventricular ejection fraction, by estimation, is 55%. The left ventricle has normal function. Left ventricular endocardial border not optimally defined to evaluate regional wall motion. Strain was performed and the global longitudinal strain is indeterminate. The left ventricular internal cavity size was normal in size. There is no left ventricular hypertrophy. Abnormal (paradoxical) septal motion, consistent with RV pacemaker. Left ventricular diastolic function could not be evaluated due to abnormal septal motion. Left ventricular diastolic function could not be evaluated. Right Ventricle: The right ventricular size is normal. No increase in right ventricular wall thickness. Right ventricular systolic function is normal. There is normal pulmonary artery systolic pressure. The tricuspid regurgitant velocity is 2.68 m/s, and  with an assumed right atrial pressure of 3 mmHg, the estimated right ventricular  systolic pressure is 31.7 mmHg. Left Atrium: Left atrial size was severely dilated. Right Atrium: Right atrial size was severely dilated. Pericardium: There is no evidence of pericardial effusion. Mitral Valve: The mitral valve is abnormal. There is mild prolapse of posterior leaflet of the mitral valve. Moderate to severe mitral valve regurgitation. No evidence of mitral valve stenosis. MV peak gradient, 3.5 mmHg. The mean mitral valve gradient is 1.0 mmHg. Tricuspid Valve: Two TR jets seen, mild in intensity. The tricuspid valve is normal in structure. Tricuspid valve regurgitation is mild . No evidence of tricuspid stenosis. Aortic Valve: The aortic valve is tricuspid. Aortic valve regurgitation is not visualized. No aortic stenosis is present. Aortic valve mean gradient measures 2.0 mmHg. Aortic valve peak gradient measures 3.8 mmHg. Aortic valve area, by VTI measures 2.20 cm. Pulmonic Valve: The pulmonic valve was normal in structure. Pulmonic valve regurgitation is trivial. No evidence of pulmonic stenosis. Aorta: The aortic root and ascending aorta are structurally normal, with no evidence of dilitation. Venous: The inferior vena cava is normal in size with greater than 50% respiratory variability, suggesting right atrial pressure of 3 mmHg. IAS/Shunts: No atrial level shunt detected by color flow Doppler. Additional Comments: 3D was performed not requiring image post processing on an independent workstation and was indeterminate. A device lead is visualized.  LEFT VENTRICLE PLAX 2D LVIDd:         4.90 cm LVIDs:         3.40 cm LV PW:         0.70 cm LV IVS:        0.60 cm LVOT diam:     2.00 cm LV SV:         44 LV SV Index:   27 LVOT Area:     3.14 cm  LV Volumes (MOD) LV vol d, MOD A2C: 120.0 ml LV vol d, MOD A4C: 131.0 ml LV vol s, MOD A2C: 44.0 ml LV vol s, MOD A4C: 59.5 ml LV SV MOD A2C:     76.0 ml LV SV MOD A4C:     131.0 ml LV SV MOD BP:      72.1 ml RIGHT VENTRICLE             IVC RV Basal diam:   3.60 cm     IVC diam: 1.60 cm RV S prime:     11.40 cm/s TAPSE (M-mode): 1.5 cm RVSP:           31.7 mmHg LEFT ATRIUM              Index        RIGHT ATRIUM           Index LA diam:        4.40 cm  2.74 cm/m   RA Pressure: 3.00 mmHg LA Vol (A2C):   110.0 ml 68.58 ml/m  RA Area:     23.20 cm LA Vol (A4C):   73.7 ml  45.95 ml/m  RA Volume:   68.00 ml  42.39 ml/m LA Biplane Vol: 90.7 ml  56.55 ml/m  AORTIC VALVE                    PULMONIC VALVE AV Area (Vmax):    2.32 cm     PV Vmax:          0.69 m/s AV Area (Vmean):   2.05 cm     PV Peak grad:     1.9 mmHg  AV Area (VTI):     2.20 cm     PR End Diast Vel: 5.02 msec AV Vmax:           97.50 cm/s AV Vmean:          69.700 cm/s AV VTI:            0.200 m AV Peak Grad:      3.8 mmHg AV Mean Grad:      2.0 mmHg LVOT Vmax:         71.90 cm/s LVOT Vmean:        45.500 cm/s LVOT VTI:          0.140 m LVOT/AV VTI ratio: 0.70  AORTA Ao Root diam: 3.40 cm Ao Asc diam:  3.40 cm MITRAL VALVE                  TRICUSPID VALVE MV Area (PHT): 2.46 cm       TR Peak grad:   28.7 mmHg MV Area VTI:   1.80 cm       TR Vmax:        268.00 cm/s MV Peak grad:  3.5 mmHg       Estimated RAP:  3.00 mmHg MV Mean grad:  1.0 mmHg       RVSP:           31.7 mmHg MV Vmax:       0.94 m/s MV Vmean:      48.0 cm/s      SHUNTS MV Decel Time: 309 msec       Systemic VTI:  0.14 m MR Peak grad:    118.1 mmHg   Systemic Diam: 2.00 cm MR Mean grad:    76.5 mmHg MR Vmax:         543.33 cm/s MR Vmean:        411.5 cm/s MR PISA:         6.28 cm MR PISA Eff ROA: 36 mm MR PISA Radius:  1.00 cm MV E velocity: 88.20 cm/s MV A velocity: 31.10 cm/s MV E/A ratio:  2.84 Vishnu Priya Mallipeddi Electronically signed by Diannah Late Mallipeddi Signature Date/Time: 01/03/2024/3:06:13 PM    Final    CT Angio Chest/Abd/Pel for Dissection W and/or Wo Contrast Result Date: 01/02/2024 CLINICAL DATA:  Chest and abdominal pain. Concern for aortic dissection. EXAM: CT ANGIOGRAPHY CHEST, ABDOMEN AND PELVIS  TECHNIQUE: Non-contrast CT of the chest was initially obtained. Multidetector CT imaging through the chest, abdomen and pelvis was performed using the standard protocol during bolus administration of intravenous contrast. Multiplanar reconstructed images and MIPs were obtained and reviewed to evaluate the vascular anatomy. RADIATION DOSE REDUCTION: This exam was performed according to the departmental dose-optimization program which includes automated exposure control, adjustment of the mA and/or kV according to patient size and/or use of iterative reconstruction technique. CONTRAST:  80mL OMNIPAQUE  IOHEXOL  350 MG/ML SOLN COMPARISON:  Chest radiograph dated 01/02/2024. FINDINGS: CTA CHEST FINDINGS Cardiovascular: Top-normal cardiac size. There is coronary vascular calcification. Left pectoral pacemaker device. Small pericardial effusion measuring 6 mm in thickness. Mild atherosclerotic calcification of the thoracic aorta. No aneurysmal dilatation or dissection. The origins of the great vessels of the aortic arch appear patent. No pulmonary artery embolus identified. Mediastinum/Nodes: No hilar or mediastinal adenopathy. The esophagus and the thyroid  gland are grossly unremarkable. No mediastinal fluid collection. Lungs/Pleura: Minimal bibasilar dependent atelectasis. No focal consolidation, pleural effusion, pneumothorax. The central airways are patent. Musculoskeletal: Osteopenia with degenerative  changes of the spine. No acute osseous pathology. Review of the MIP images confirms the above findings. CTA ABDOMEN AND PELVIS FINDINGS VASCULAR Aorta: Mild atherosclerotic calcification. No aneurysmal dilatation or dissection. Celiac: The celiac trunk and its major branches are patent. SMA: The SMA is patent. Renals: The renal arteries are patent. IMA: The IMA is patent. Inflow: The iliac arteries are patent. No as well dilatation or dissection. Veins: No obvious venous abnormality within the limitations of this arterial  phase study. Review of the MIP images confirms the above findings. NON-VASCULAR No intra-abdominal free air or free fluid. Hepatobiliary: There is irregularity of the liver contour suspicious for cirrhosis. No biliary ductal dilatation. Small cyst in the left lobe of the liver. No calcified gallstone or pericholecystic fluid. Pancreas: Unremarkable. No pancreatic ductal dilatation or surrounding inflammatory changes. Spleen: Normal in size without focal abnormality. Adrenals/Urinary Tract: The adrenal glands unremarkable. The kidneys, visualized ureters, and urinary bladder unremarkable. Stomach/Bowel: There is mild colonic diverticulosis. There is no bowel obstruction or active inflammation. The appendix is normal. Lymphatic: No adenopathy. Reproductive: Prostate brachytherapy seeds noted. Other: None Musculoskeletal: Osteopenia with degenerative changes. No acute osseous pathology. Total left hip arthroplasty with associated streak artifact limiting evaluation of the pelvic structures. Review of the MIP images confirms the above findings. IMPRESSION: 1. No acute intrathoracic, abdominal, or pelvic pathology. No aortic aneurysm or dissection. 2. Mild colonic diverticulosis. No bowel obstruction. Normal appendix. 3.  Aortic Atherosclerosis (ICD10-I70.0). Electronically Signed   By: Vanetta Chou M.D.   On: 01/02/2024 19:45   DG Chest Port 1 View Result Date: 01/02/2024 CLINICAL DATA:  355200 Chest pain 355200 EXAM: PORTABLE CHEST - 1 VIEW COMPARISON:  August 26, 2023 FINDINGS: Lower lung volumes. Biapical pleural thickening. No focal airspace consolidation, pleural effusion, or pneumothorax. Mild cardiomegaly. Left chest pacemaker/AICD with leads terminating in the right atrium, right ventricle, and coronary sinus. tortuous aorta with aortic atherosclerosis. No acute fracture or destructive lesions. Multilevel thoracic osteophytosis. AC joint osteoarthritis. IMPRESSION: Low lung volumes.  Otherwise, no acute  cardiopulmonary abnormality. Electronically Signed   By: Rogelia Myers M.D.   On: 01/02/2024 18:38     Labs:   Basic Metabolic Panel: Recent Labs  Lab 01/02/24 1800 01/03/24 0341 01/04/24 0334  NA 140 139 136  K 4.1 4.5 3.5  CL 106 107 105  CO2 20* 20* 20*  GLUCOSE 102* 94 107*  BUN 24* 23 21  CREATININE 1.79* 1.63* 1.61*  CALCIUM  9.1 8.8* 8.2*   GFR Estimated Creatinine Clearance: 28.5 mL/min (A) (by C-G formula based on SCr of 1.61 mg/dL (H)). Liver Function Tests: Recent Labs  Lab 01/02/24 1800  AST 29  ALT 30  ALKPHOS 67  BILITOT 1.2  PROT 6.6  ALBUMIN  4.0   No results for input(s): LIPASE, AMYLASE in the last 168 hours. No results for input(s): AMMONIA in the last 168 hours. Coagulation profile No results for input(s): INR, PROTIME in the last 168 hours.  CBC: Recent Labs  Lab 01/02/24 1800 01/03/24 0341 01/04/24 0334 01/04/24 0805 01/05/24 0331 01/06/24 0352  WBC 7.2 6.1 3.6*  --  7.4 4.5  NEUTROABS 6.9  --   --   --   --   --   HGB 9.2* 8.0* 7.0* 7.1* 12.4* 11.5*  HCT 28.6* 25.0* 21.3* 21.5* 36.3* 33.8*  MCV 106.3* 106.8* 104.4*  --  95.3 97.7  PLT 232 178 128*  --  111* 104*   Cardiac Enzymes: No results for input(s): CKTOTAL, CKMB,  CKMBINDEX, TROPONINI in the last 168 hours. BNP: Invalid input(s): POCBNP CBG: No results for input(s): GLUCAP in the last 168 hours. D-Dimer No results for input(s): DDIMER in the last 72 hours. Hgb A1c No results for input(s): HGBA1C in the last 72 hours. Lipid Profile No results for input(s): CHOL, HDL, LDLCALC, TRIG, CHOLHDL, LDLDIRECT in the last 72 hours. Thyroid  function studies No results for input(s): TSH, T4TOTAL, T3FREE, THYROIDAB in the last 72 hours.  Invalid input(s): FREET3 Anemia work up [employer]    01/04/24 0334 01/04/24 0812  VITAMINB12  --  814  FOLATE  --  <3.0*  FERRITIN 794*  --   TIBC  --  185*  IRON  --  26*    Microbiology No results found for this or any previous visit (from the past 240 hours).   Discharge Instructions:   Discharge Instructions     Diet - low sodium heart healthy   Complete by: As directed    Discharge instructions   Complete by: As directed    Follow-up with your primary care provider in 1 week.  Check blood work at that time.  Follow-up with your doctor thin GI in 2 weeks and discuss about blood thinners at that time.  Follow-up with cardiology in 1 to 2 weeks to discuss about referral for Watchman device.  Do not take over-the-counter pain medication including Motrin Aleve.   Increase activity slowly   Complete by: As directed       Allergies as of 01/06/2024       Reactions   Amiodarone Other (See Comments)   Severe joint pain and blindness   Flecainide  Other (See Comments)   Severe headaches, blurred vision   Tamsulosin Hives, Itching   Tamsulosin Hcl Hives, Itching, Dermatitis, Rash   Carvedilol  Other (See Comments)   Unknown   Dapagliflozin Diarrhea   Eplerenone Other (See Comments)   Unknown   Lisinopril  Nausea And Vomiting, Nausea Only   Metoprolol  Other (See Comments)   Headaches, dizziness   Sacubitril-valsartan Nausea And Vomiting, Nausea Only   Spironolactone  Swelling, Other (See Comments)   Breast swelling, headaches   Statins Other (See Comments)   Leg cramps   Sulfasalazine Nausea Only        Medication List     PAUSE taking these medications    apixaban  2.5 MG Tabs tablet Wait to take this until your doctor or other care provider tells you to start again. Commonly known as: ELIQUIS  Take 1 tablet (2.5 mg total) by mouth 2 (two) times daily.       STOP taking these medications    guaiFENesin 600 MG 12 hr tablet Commonly known as: MUCINEX       TAKE these medications    acetaminophen  325 MG tablet Commonly known as: TYLENOL  Take 650 mg by mouth as needed for mild pain (pain score 1-3).   chlorhexidine  0.12 %  solution Commonly known as: PERIDEX  Use as directed 5 mLs in the mouth or throat 2 (two) times daily.   CRANBERRY PO Take 500 mg by mouth daily.   ferrous sulfate 325 (65 FE) MG tablet Take 1 tablet (325 mg total) by mouth 2 (two) times daily with a meal.   folic acid  1 MG tablet Commonly known as: FOLVITE  Take 1 mg by mouth daily.  NOT on Friday d/t methotrexate    furosemide  20 MG tablet Commonly known as: LASIX  Take 1 tablet (20 mg total) by mouth daily.   ipratropium 0.06 %  nasal spray Commonly known as: ATROVENT Place 2 sprays into both nostrils daily as needed for rhinitis.   losartan  25 MG tablet Commonly known as: COZAAR  Take 0.5 tablets (12.5 mg total) by mouth daily. What changed: when to take this   methotrexate  2.5 MG tablet Commonly known as: RHEUMATREX Take 2.5 mg by mouth every Friday. 6 pills weekly   mexiletine 150 MG capsule Commonly known as: MEXITIL  Take 150 mg by mouth 2 (two) times daily.   Nitrostat  0.4 MG SL tablet Generic drug: nitroGLYCERIN  DISSOLVE 1 TABLET UNDER TONGUE EVERY 5 MINUTES AS NEEDED FOR CHEST PAIN What changed: See the new instructions.   ondansetron  4 MG tablet Commonly known as: ZOFRAN  Take 4 mg by mouth every 8 (eight) hours as needed for nausea.   pantoprazole  40 MG tablet Commonly known as: PROTONIX  Take 1 tablet (40 mg total) by mouth 2 (two) times daily before a meal. What changed:  when to take this reasons to take this   potassium chloride  SA 20 MEQ tablet Commonly known as: KLOR-CON  M Take 20 mEq by mouth daily.   PROBIOTIC DAILY PO Take 110 mg by mouth daily.   rosuvastatin  5 MG tablet Commonly known as: CRESTOR  Take 5 mg by mouth every Sunday.   senna-docusate 8.6-50 MG tablet Commonly known as: Senokot-S Take 2 tablets by mouth at bedtime.   sodium bicarbonate  650 MG tablet Take 650 mg by mouth daily.   sucralfate 1 GM/10ML suspension Commonly known as: CARAFATE Take 10 mLs (1 g total) by mouth  3 (three) times daily for 5 days.   traMADol 50 MG tablet Commonly known as: ULTRAM Take 50 mg by mouth every 6 (six) hours as needed for moderate pain (pain score 4-6).        Follow-up Information     Brent Bigness, MD Follow up in 1 week(s).   Specialties: Internal Medicine, Infectious Diseases Contact information: [address] [place] [address] [phone]         Garlan Dolly I, MD Follow up in 1 week(s).   Specialty: Internal Medicine Why: anemia followup Contact information: [address] [place] [address] [phone]         Mallipeddi, Vishnu P, MD Follow up in 1 week(s).   Specialties: Cardiology, Internal Medicine Why: afib, discussion on watchman device Contact information: [address] [place] [address] [phone]                  Time coordinating discharge: 39 minutes  Signed:  Saliah Crisp  [facility] 01/06/2024, 1:26 PM

## 2024-01-07 ENCOUNTER — Other Ambulatory Visit: Payer: Self-pay

## 2024-01-07 ENCOUNTER — Telehealth (HOSPITAL_COMMUNITY): Payer: Self-pay

## 2024-01-07 ENCOUNTER — Emergency Department (HOSPITAL_COMMUNITY)
Admission: EM | Admit: 2024-01-07 | Discharge: 2024-01-07 | Disposition: A | Discharge status: Home / Self Care | Attending: Emergency Medicine | Admitting: Emergency Medicine

## 2024-01-07 ENCOUNTER — Encounter (HOSPITAL_COMMUNITY): Payer: Self-pay | Admitting: Internal Medicine

## 2024-01-07 DIAGNOSIS — Z79899 Other long term (current) drug therapy: Secondary | ICD-10-CM | POA: Diagnosis not present

## 2024-01-07 DIAGNOSIS — K644 Residual hemorrhoidal skin tags: Secondary | ICD-10-CM | POA: Insufficient documentation

## 2024-01-07 DIAGNOSIS — K921 Melena: Secondary | ICD-10-CM

## 2024-01-07 DIAGNOSIS — I4891 Unspecified atrial fibrillation: Secondary | ICD-10-CM | POA: Insufficient documentation

## 2024-01-07 DIAGNOSIS — I509 Heart failure, unspecified: Secondary | ICD-10-CM | POA: Diagnosis not present

## 2024-01-07 DIAGNOSIS — I2510 Atherosclerotic heart disease of native coronary artery without angina pectoris: Secondary | ICD-10-CM | POA: Insufficient documentation

## 2024-01-07 DIAGNOSIS — I129 Hypertensive chronic kidney disease with stage 1 through stage 4 chronic kidney disease, or unspecified chronic kidney disease: Secondary | ICD-10-CM | POA: Insufficient documentation

## 2024-01-07 DIAGNOSIS — Z7901 Long term (current) use of anticoagulants: Secondary | ICD-10-CM | POA: Insufficient documentation

## 2024-01-07 DIAGNOSIS — N189 Chronic kidney disease, unspecified: Secondary | ICD-10-CM | POA: Diagnosis not present

## 2024-01-07 DIAGNOSIS — Z85820 Personal history of malignant melanoma of skin: Secondary | ICD-10-CM | POA: Insufficient documentation

## 2024-01-07 DIAGNOSIS — K649 Unspecified hemorrhoids: Secondary | ICD-10-CM

## 2024-01-07 DIAGNOSIS — K625 Hemorrhage of anus and rectum: Secondary | ICD-10-CM | POA: Diagnosis present

## 2024-01-07 LAB — CBC WITH DIFFERENTIAL/PLATELET
Abs Immature Granulocytes: 0.02 K/uL (ref 0.00–0.07)
Basophils Absolute: 0 K/uL (ref 0.0–0.1)
Basophils Relative: 1 %
Eosinophils Absolute: 0 K/uL (ref 0.0–0.5)
Eosinophils Relative: 1 %
HCT: 38.9 % — ABNORMAL LOW (ref 39.0–52.0)
Hemoglobin: 12.5 g/dL — ABNORMAL LOW (ref 13.0–17.0)
Immature Granulocytes: 1 %
Lymphocytes Relative: 8 %
Lymphs Abs: 0.4 K/uL — ABNORMAL LOW (ref 0.7–4.0)
MCH: 32.6 pg (ref 26.0–34.0)
MCHC: 32.1 g/dL (ref 30.0–36.0)
MCV: 101.6 fL — ABNORMAL HIGH (ref 80.0–100.0)
Monocytes Absolute: 0.7 K/uL (ref 0.1–1.0)
Monocytes Relative: 16 %
Neutro Abs: 3.2 K/uL (ref 1.7–7.7)
Neutrophils Relative %: 73 %
Platelets: 171 K/uL (ref 150–400)
RBC: 3.83 MIL/uL — ABNORMAL LOW (ref 4.22–5.81)
RDW: 18.3 % — ABNORMAL HIGH (ref 11.5–15.5)
WBC: 4.3 K/uL (ref 4.0–10.5)
nRBC: 0 % (ref 0.0–0.2)

## 2024-01-07 LAB — COMPREHENSIVE METABOLIC PANEL WITH GFR
ALT: 31 U/L (ref 0–44)
AST: 36 U/L (ref 15–41)
Albumin: 3.9 g/dL (ref 3.5–5.0)
Alkaline Phosphatase: 91 U/L (ref 38–126)
Anion gap: 14 (ref 5–15)
BUN: 30 mg/dL — ABNORMAL HIGH (ref 8–23)
CO2: 20 mmol/L — ABNORMAL LOW (ref 22–32)
Calcium: 9.1 mg/dL (ref 8.9–10.3)
Chloride: 107 mmol/L (ref 98–111)
Creatinine, Ser: 1.85 mg/dL — ABNORMAL HIGH (ref 0.61–1.24)
GFR, Estimated: 37 mL/min — ABNORMAL LOW (ref >60)
Glucose, Bld: 114 mg/dL — ABNORMAL HIGH (ref 70–99)
Potassium: 4.2 mmol/L (ref 3.5–5.1)
Sodium: 140 mmol/L (ref 135–145)
Total Bilirubin: 0.7 mg/dL (ref 0.0–1.2)
Total Protein: 6.7 g/dL (ref 6.5–8.1)

## 2024-01-07 LAB — TYPE AND SCREEN
ABO/RH(D): O POS
Antibody Screen: NEGATIVE

## 2024-01-07 MED ORDER — HYDROCORTISONE (PERIANAL) 2.5 % EX CREA: 1.0000 | TOPICAL_CREAM | Freq: Two times a day (BID) | CUTANEOUS | 0 refills | Status: AC

## 2024-01-07 NOTE — ED Provider Notes (Signed)
 [place] EMERGENCY DEPARTMENT AT [facility] Provider Note   CSN: [id number] Arrival date & time: 01/07/24  8286     Patient presents with: Rectal Bleeding   Noble Cicalese. is a 78 y.o. male.   Pt is a 78 yo male with pmhx significant for CAD, HTN, VT s/p AICD, HLD, Barrett's esophagus, GERD, CHF, CKD, Afib (Eliquis  on hold), and skin cancer.  Pt was admitted to the hospital from 10/22-26 for ugi bleed with anemia and cp.  He was started on iron twice a day and has not had a bowel movement in several days.  He tried to have a bowel movement and said his hemorrhoids came out and he started having some bleeding.  He was finally able to have a bowel movement in the waiting room.         Prior to Admission medications   Medication Sig Start Date End Date Taking? Authorizing Provider  hydrocortisone  (ANUSOL -HC) 2.5 % rectal cream Place 1 Application rectally 2 (two) times daily. 01/07/24  Yes Dean Clarity, MD  acetaminophen  (TYLENOL ) 325 MG tablet Take 650 mg by mouth as needed for mild pain (pain score 1-3).    [provider]  apixaban  (ELIQUIS ) 2.5 MG TABS tablet Take 1 tablet (2.5 mg total) by mouth 2 (two) times daily. 08/29/23   Odell Celinda Balo, MD  chlorhexidine  (PERIDEX ) 0.12 % solution Use as directed 5 mLs in the mouth or throat 2 (two) times daily. 12/03/23   [provider]  CRANBERRY PO Take 500 mg by mouth daily.    [provider]  ferrous sulfate 325 (65 FE) MG tablet Take 1 tablet (325 mg total) by mouth 2 (two) times daily with a meal. 01/06/24 04/05/24  Pokhrel, Laxman, MD  folic acid  (FOLVITE ) 1 MG tablet Take 1 mg by mouth daily.  NOT on Friday d/t methotrexate  12/13/22   [provider]  furosemide  (LASIX ) 20 MG tablet Take 1 tablet (20 mg total) by mouth daily. 08/21/23   Sabharwal, Aditya, DO  ipratropium (ATROVENT) 0.06 % nasal spray Place 2 sprays into both nostrils daily as needed for rhinitis.    [provider]  losartan  (COZAAR ) 25 MG tablet Take 0.5 tablets (12.5 mg total) by mouth daily. Patient taking differently: Take 12.5 mg by mouth in the morning and at bedtime. 09/25/23   Mallipeddi, Vishnu P, MD  methotrexate  (RHEUMATREX) 2.5 MG tablet Take 2.5 mg by mouth every Friday. 6 pills weekly    [provider]  mexiletine (MEXITIL ) 150 MG capsule Take 150 mg by mouth 2 (two) times daily.    [provider]  NITROSTAT  0.4 MG SL tablet DISSOLVE 1 TABLET UNDER TONGUE EVERY 5 MINUTES AS NEEDED FOR CHEST PAIN Patient taking differently: Place 0.4 mg under the tongue every 5 (five) minutes as needed for chest pain. 05/11/14   Debera Jayson MATSU, MD  ondansetron  (ZOFRAN ) 4 MG tablet Take 4 mg by mouth every 8 (eight) hours as needed for nausea. 01/15/23   [provider]  pantoprazole  (PROTONIX ) 40 MG tablet Take 1 tablet (40 mg total) by mouth 2 (two) times daily before a meal. 01/06/24 03/06/24  Pokhrel, Vernal, MD  potassium chloride  SA (KLOR-CON  M) 20 MEQ tablet Take 20 mEq by mouth daily.    [provider]  Probiotic Product (PROBIOTIC DAILY PO) Take 110 mg by mouth daily.    [provider]  rosuvastatin  (CRESTOR ) 5 MG tablet Take 5 mg by  mouth every Sunday.    [provider]  senna-docusate (SENOKOT-S) 8.6-50 MG tablet Take 2 tablets by mouth at bedtime. 01/06/24 04/05/24  Pokhrel, Laxman, MD  sodium bicarbonate  650 MG tablet Take 650 mg by mouth daily.    [provider]  sucralfate (CARAFATE) 1 GM/10ML suspension Take 10 mLs (1 g total) by mouth 3 (three) times daily for 5 days. 01/06/24 01/11/24  Pokhrel, Laxman, MD  traMADol (ULTRAM) 50 MG tablet Take 50 mg by mouth every 6 (six) hours as needed for moderate pain (pain score 4-6).    [provider]    Allergies: Amiodarone, Flecainide , Tamsulosin, Tamsulosin hcl, Carvedilol , Dapagliflozin, Eplerenone, Lisinopril , Metoprolol , Sacubitril-valsartan, Spironolactone ,  Statins, and Sulfasalazine    Review of Systems  Gastrointestinal:  Positive for blood in stool and constipation.  All other systems reviewed and are negative.   Updated Vital Signs BP (!) 144/74   Pulse 72   Temp 97.8 F (36.6 C) (Oral)   Resp 18   Ht 5' 8 (1.727 m)   Wt 51.7 kg   SpO2 100%   BMI 17.33 kg/m   Physical Exam Vitals and nursing note reviewed. Exam conducted with a chaperone present.  Constitutional:      Appearance: Normal appearance.  HENT:     Head: Normocephalic and atraumatic.     Right Ear: External ear normal.     Left Ear: External ear normal.     Nose: Nose normal.     Mouth/Throat:     Mouth: Mucous membranes are moist.     Pharynx: Oropharynx is clear.  Eyes:     Extraocular Movements: Extraocular movements intact.     Conjunctiva/sclera: Conjunctivae normal.     Pupils: Pupils are equal, round, and reactive to light.  Cardiovascular:     Rate and Rhythm: Normal rate and regular rhythm.     Pulses: Normal pulses.     Heart sounds: Normal heart sounds.  Pulmonary:     Effort: Pulmonary effort is normal.     Breath sounds: Normal breath sounds.  Abdominal:     General: Abdomen is flat. Bowel sounds are normal.     Palpations: Abdomen is soft.  Genitourinary:    Rectum: External hemorrhoid present.     Comments: No brbpr or impaction Musculoskeletal:        General: Normal range of motion.     Cervical back: Normal range of motion and neck supple.  Skin:    General: Skin is warm.     Capillary Refill: Capillary refill takes less than 2 seconds.  Neurological:     General: No focal deficit present.     Mental Status: He is alert and oriented to person, place, and time.  Psychiatric:        Mood and Affect: Mood normal.        Behavior: Behavior normal.     (all labs ordered are listed, but only abnormal results are displayed) Labs Reviewed  CBC WITH DIFFERENTIAL/PLATELET - Abnormal; Notable for the following components:       Result Value   RBC 3.83 (*)    Hemoglobin 12.5 (*)    HCT 38.9 (*)    MCV 101.6 (*)    RDW 18.3 (*)    Lymphs Abs 0.4 (*)    All other components within normal limits  COMPREHENSIVE METABOLIC PANEL WITH GFR - Abnormal; Notable for the following components:   CO2 20 (*)    Glucose, Bld 114 (*)  BUN 30 (*)    Creatinine, Ser 1.85 (*)    GFR, Estimated 37 (*)    All other components within normal limits  POC OCCULT BLOOD, ED  TYPE AND SCREEN    EKG: None  Radiology: No results found.   Procedures   Medications Ordered in the ED - No data to display                                  Medical Decision Making Amount and/or Complexity of Data Reviewed Labs: ordered.  Risk Prescription drug management.   This patient presents to the ED for concern of rectal bleeding, this involves an extensive number of treatment options, and is a complaint that carries with it a high risk of complications and morbidity.  The differential diagnosis includes hemorrhoids, diverticular bleed, ugi bleed   Co morbidities that complicate the patient evaluation  CAD, HTN, VT s/p AICD, HLD, Barrett's esophagus, GERD, CHF, CKD, Afib (Eliquis  on hold), and skin cancer.   Additional history obtained:  Additional history obtained from epic chart review External records from outside source obtained and reviewed including wife   Lab Tests:  I Ordered, and personally interpreted labs.  The pertinent results include:  cbc with hgb 12.5 (11.5 yest); cmp with bun 30 and cr 1.85   Medicines ordered and prescription drug management:   I have reviewed the patients home medicines and have made adjustments as needed  Problem List / ED Course:  BRBPR:  likely hemorrhoidal.  He's off the Eliquis  right now.  His hgb is better than yesterday.  The iron is causing severe constipation and since hgb has improved, I told him to take it every other day and to increase fiber.  He is to return if worse.  F/u  with gi.   Reevaluation:  After the interventions noted above, I reevaluated the patient and found that they have :improved   Social Determinants of Health:  Lives at home   Dispostion:  After consideration of the diagnostic results and the patients response to treatment, I feel that the patent would benefit from discharge with outpatient f/u.       Final diagnoses:  Hemorrhoids, unspecified hemorrhoid type  Hematochezia    ED Discharge Orders          Ordered    hydrocortisone  (ANUSOL -HC) 2.5 % rectal cream  2 times daily        01/07/24 2016               Dean Clarity, MD 01/07/24 2151

## 2024-01-07 NOTE — ED Notes (Signed)
Patient verbalizes understanding of discharge instructions. Opportunity for questioning and answers were provided. Armband removed by staff, pt discharged from ED. Wheeled out to car with wife

## 2024-01-07 NOTE — Telephone Encounter (Signed)
 Called to confirm/remind patient of their appointment at the [facility] on 01/08/24.   Appointment:   [x] Confirmed  [] Left mess   [] No answer/No voice mail  [] VM Full/unable to leave message  [] Phone not in service  Patient reminded to bring all medications and/or complete list.  Confirmed patient has transportation. Gave directions, instructed to utilize valet parking.

## 2024-01-07 NOTE — ED Triage Notes (Signed)
 Pt arrived via POV c/o rectal bleeding that occurred today after trying to place a suppository in himself to relieve his constipation. Pt reports last BM was 3 days ago. Pt reports he has been holding his Eliquis , and reports concern for a ruptured hemorrhoid.

## 2024-01-07 NOTE — Progress Notes (Signed)
 [facility]   Primary Care: Meade Bigness, MD Nephrology: Dr. Watt ([facility]) EP: Dr. Nancey Primary Cardiologist: Dr. Stacia HF Cardiologist: assign to Dr. Zenaida  HPI: Brent Ray. is a 78 y.o. 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    Unfortunately he reports intolerance to almost every class of GDMT available. During previous HF visit he agreed to start trying losartan  due to a rapid decline in functional status. Unfortunately on the way home from clinic he had a stroke leading to a week long hospitalization (left ICA occlusion).   Admitted 10/25 with CP. HsTroponin flat. CTA chest showed small pericardial effusion. Echo showed EF 55%, RV normal, moderate to severe MR. Cards consulted, planned for [facility] but ultimately cancelled due to rapidly declining hgb, down to 7. Felt CP 2/2 to anemia. AC held and patient underwent upper GI endoscopy showing markedly raw friable gastric mucosa, no gastric ulcer but he did have gum ulcers. Received PRBCs & IV iron. Remained off AC until GI follow up and discharged home, weight 113 lbs.   Today he returns for post hospital HF follow up with his wife. Overall feeling fine. Breathing OK, no undue dyspnea, feels weak. Some swelling in feet. Had bleeding last night after BM, seen in ED and CBC stable. Remains off AC, not interested in restarting. Denies palpitations, CP, dizziness, or PND/Orthopnea. Appetite ok. Weight at home 116 pounds. Taking all medications. Planning to start back with PT.   Wt Readings from Last 3 Encounters:  01/08/24 54.3 kg (119 lb 12.8 oz)  01/07/24 51.7 kg (113 lb 15.7 oz)  01/06/24 51.7  kg (113 lb 15.7 oz)   BP 132/70   Pulse 70   Ht 5' 8 (1.727 m)   Wt 54.3 kg (119 lb 12.8 oz)   SpO2 94%   BMI 18.22 kg/m   PHYSICAL EXAM: General:  NAD. No resp difficulty, walked into clinic HEENT: Normal Neck: Supple. No JVD. Cor: Regular rate & rhythm. No rubs, gallops or murmurs. Lungs: Clear Abdomen: Soft, nontender, nondistended.  Extremities: No cyanosis, clubbing, rash, 2+ BLE pre-tibial edema L>R Neuro: Alert & oriented x 3, moves all 4 extremities w/o difficulty. Affect pleasant.  Device interrogation (personally reviewed): OptiVol up, thoracic impedence down, 0.8 hr/day activity, no VT or AF, 95% VP  DATA REVIEW  ECG: 08/21/23: atrial fibrillation with BiV pacing.  01/08/24: AF, V paced, PVC  ECHO: 01/29/23: LVEF 30-35%, normal RV function, mod MR.   08/22/23: LVEF 35-40% 01/03/24: EF 55%, normal RV, moderate to severe MR  ASSESSMENT & PLAN: Chronic HFrEF, now with recovered EF - Nonischemic cardiomyopathy - s/p ICD - EF has recovered by 10/25 echo: EF 50-55% - NYHA II-III, functional class confounded by deconditioning. Volume OK up a bit on exam and device, weight up 6 lbs - GDMT limited by drug intolerance. - Increase Lasix  to 20 mg bid, increase KCL to 20 bid - Continue losartan  12.5 mg qhs - Did not tolerate spiro, dig, hydral/nitrate, SGLT2i, or beta blockers.  - Not a candidate for advanced therapies. EF now improved. - Labs reviewed from ED yesterday and are stable; K 4.2, creatinine 1.85 - I will ask device RN to send transmission in 1 week to follow OptiVol after increase in  Lasix  - Check BMET in 7-10 days (given Rx for [facility] in [place]).   GIB - recent admit with GIB - Endoscopy with oral ulcers - CBC in ED yesterday stable at 12.5 - Eliquis  on hold until GI follow up (not sure he is going to be able to stay on [facility] with recurrent bleeds)  Permanent atrial fibrillation - s/p multiple ablations - Currently on mexelitine 150 mg bid - AC on  hold with recent GIB, not sure he is going to be able to tolerate [facility] - Refer to EP for Watchman consideration, follows with Dr. Nancey  CKD - Baseline sCr 1.8-2.4 - Followed by nephrology - Labs reviewed yesterday, SCr 1.85  CVA - Admitted in 6/25 with acute stroke; motor deficits now mostly resolved.  - He has stopped his statin  MR - moderate to severe on echo 10/25 - follow  Follow up in 6 months with Dr. Zenaida. If EF remains stable, consider graduation from [facility] clinic  [place], FNP-BC 01/08/24

## 2024-01-07 NOTE — Discharge Instructions (Addendum)
 Decrease iron to 1 pill every other day.

## 2024-01-08 ENCOUNTER — Ambulatory Visit: Payer: Self-pay | Admitting: Internal Medicine

## 2024-01-08 ENCOUNTER — Ambulatory Visit (HOSPITAL_COMMUNITY)
Admission: RE | Admit: 2024-01-08 | Discharge: 2024-01-08 | Disposition: A | Discharge status: Home / Self Care | Source: Ambulatory Visit | Attending: Family Medicine | Admitting: Family Medicine

## 2024-01-08 ENCOUNTER — Other Ambulatory Visit (HOSPITAL_COMMUNITY): Payer: Self-pay

## 2024-01-08 ENCOUNTER — Encounter (HOSPITAL_COMMUNITY): Payer: Self-pay

## 2024-01-08 VITALS — BP 132/70 | HR 70 | Ht 68.0 in | Wt 119.8 lb

## 2024-01-08 DIAGNOSIS — Z79899 Other long term (current) drug therapy: Secondary | ICD-10-CM | POA: Diagnosis not present

## 2024-01-08 DIAGNOSIS — Z8673 Personal history of transient ischemic attack (TIA), and cerebral infarction without residual deficits: Secondary | ICD-10-CM | POA: Insufficient documentation

## 2024-01-08 DIAGNOSIS — K922 Gastrointestinal hemorrhage, unspecified: Secondary | ICD-10-CM | POA: Insufficient documentation

## 2024-01-08 DIAGNOSIS — N189 Chronic kidney disease, unspecified: Secondary | ICD-10-CM | POA: Diagnosis not present

## 2024-01-08 DIAGNOSIS — I639 Cerebral infarction, unspecified: Secondary | ICD-10-CM | POA: Diagnosis not present

## 2024-01-08 DIAGNOSIS — I4892 Unspecified atrial flutter: Secondary | ICD-10-CM | POA: Diagnosis not present

## 2024-01-08 DIAGNOSIS — I071 Rheumatic tricuspid insufficiency: Secondary | ICD-10-CM | POA: Insufficient documentation

## 2024-01-08 DIAGNOSIS — N1831 Chronic kidney disease, stage 3a: Secondary | ICD-10-CM

## 2024-01-08 DIAGNOSIS — I4821 Permanent atrial fibrillation: Secondary | ICD-10-CM | POA: Insufficient documentation

## 2024-01-08 DIAGNOSIS — Z86718 Personal history of other venous thrombosis and embolism: Secondary | ICD-10-CM | POA: Diagnosis not present

## 2024-01-08 DIAGNOSIS — I5032 Chronic diastolic (congestive) heart failure: Secondary | ICD-10-CM | POA: Insufficient documentation

## 2024-01-08 DIAGNOSIS — Z8719 Personal history of other diseases of the digestive system: Secondary | ICD-10-CM

## 2024-01-08 DIAGNOSIS — Z95810 Presence of automatic (implantable) cardiac defibrillator: Secondary | ICD-10-CM | POA: Insufficient documentation

## 2024-01-08 DIAGNOSIS — I428 Other cardiomyopathies: Secondary | ICD-10-CM | POA: Insufficient documentation

## 2024-01-08 DIAGNOSIS — I2510 Atherosclerotic heart disease of native coronary artery without angina pectoris: Secondary | ICD-10-CM | POA: Diagnosis not present

## 2024-01-08 DIAGNOSIS — I340 Nonrheumatic mitral (valve) insufficiency: Secondary | ICD-10-CM | POA: Insufficient documentation

## 2024-01-08 LAB — SURGICAL PATHOLOGY

## 2024-01-08 MED ORDER — FUROSEMIDE 20 MG PO TABS: 20.0000 mg | ORAL_TABLET | Freq: Two times a day (BID) | ORAL | 3 refills | Status: AC

## 2024-01-08 MED ORDER — LOSARTAN POTASSIUM 25 MG PO TABS: 12.5000 mg | ORAL_TABLET | Freq: Two times a day (BID) | ORAL | 3 refills | Status: DC

## 2024-01-08 MED ORDER — LOSARTAN POTASSIUM 25 MG PO TABS: 12.5000 mg | ORAL_TABLET | Freq: Every evening | ORAL | 3 refills | Status: AC

## 2024-01-08 MED ORDER — POTASSIUM CHLORIDE CRYS ER 20 MEQ PO TBCR: 20.0000 meq | EXTENDED_RELEASE_TABLET | Freq: Two times a day (BID) | ORAL | 3 refills | Status: DC

## 2024-01-08 NOTE — Patient Instructions (Addendum)
 CHANGE Lasix  to 20 mg Twice daily  CHANGE Potassium to 20 mEq Twice daily  Blood work in 7-10 days at lab [employer] have been referred for a Watchmann's devise. You will be called to have this appointment arranged.  Your physician recommends that you schedule a follow-up appointment in: 6 months ( April 2026) ** PLEASE CALL THE OFFICE IN FEBRUARY 2026 TO ARRANGE YOUR FOLLOW UP APPOINTMENT.**  If you have any questions or concerns before your next appointment please send us  a message through [facility] or call our office at [phone].    TO LEAVE A MESSAGE FOR THE NURSE SELECT OPTION 2, PLEASE LEAVE A MESSAGE INCLUDING: YOUR NAME DATE OF BIRTH CALL BACK NUMBER REASON FOR CALL**this is important as we prioritize the call backs  YOU WILL RECEIVE A CALL BACK THE SAME DAY AS LONG AS YOU CALL BEFORE 4:00 PM  At the [facility], you and your health needs are our priority. As part of our continuing mission to provide you with exceptional heart care, we have created designated Provider Care Teams. These Care Teams include your primary Cardiologist (physician) and Advanced Practice Providers (APPs- Physician Assistants and Nurse Practitioners) who all work together to provide you with the care you need, when you need it.   You may see any of the following providers on your designated Care Team at your next follow up: Dr Toribio Fuel Dr Ezra Shuck Dr. Morene Brownie Greig Mosses, NP Caffie Shed, [place] [facility] [place], [place] Beckey Coe, NP Jordan Lee, NP Ellouise Class, NP Tinnie Redman, PharmD Jaun Bash, PharmD   Please be sure to bring in all your medications bottles to every appointment.    Thank you for choosing [facility]

## 2024-01-09 ENCOUNTER — Ambulatory Visit (HOSPITAL_COMMUNITY): Payer: Self-pay | Admitting: Family Medicine

## 2024-01-12 ENCOUNTER — Encounter (HOSPITAL_COMMUNITY)
Admission: EM | Disposition: A | Discharge status: Home Health | Payer: Self-pay | Source: Home / Self Care | Attending: Neurology

## 2024-01-12 ENCOUNTER — Encounter (HOSPITAL_COMMUNITY): Payer: Self-pay

## 2024-01-12 ENCOUNTER — Other Ambulatory Visit: Payer: Self-pay

## 2024-01-12 ENCOUNTER — Emergency Department (HOSPITAL_COMMUNITY)

## 2024-01-12 ENCOUNTER — Emergency Department (HOSPITAL_COMMUNITY): Admitting: Anesthesiology

## 2024-01-12 ENCOUNTER — Inpatient Hospital Stay (HOSPITAL_COMMUNITY)
Admission: EM | Admit: 2024-01-12 | Discharge: 2024-01-15 | DRG: 024 | Disposition: A | Discharge status: Home Health | Attending: Neurology | Admitting: Neurology

## 2024-01-12 DIAGNOSIS — Z95810 Presence of automatic (implantable) cardiac defibrillator: Secondary | ICD-10-CM

## 2024-01-12 DIAGNOSIS — I4821 Permanent atrial fibrillation: Secondary | ICD-10-CM | POA: Diagnosis present

## 2024-01-12 DIAGNOSIS — E876 Hypokalemia: Secondary | ICD-10-CM | POA: Diagnosis present

## 2024-01-12 DIAGNOSIS — I130 Hypertensive heart and chronic kidney disease with heart failure and stage 1 through stage 4 chronic kidney disease, or unspecified chronic kidney disease: Secondary | ICD-10-CM | POA: Diagnosis present

## 2024-01-12 DIAGNOSIS — R4781 Slurred speech: Secondary | ICD-10-CM | POA: Diagnosis present

## 2024-01-12 DIAGNOSIS — R531 Weakness: Secondary | ICD-10-CM | POA: Diagnosis not present

## 2024-01-12 DIAGNOSIS — R29700 NIHSS score 0: Secondary | ICD-10-CM | POA: Diagnosis not present

## 2024-01-12 DIAGNOSIS — Z7901 Long term (current) use of anticoagulants: Secondary | ICD-10-CM

## 2024-01-12 DIAGNOSIS — I428 Other cardiomyopathies: Secondary | ICD-10-CM | POA: Diagnosis present

## 2024-01-12 DIAGNOSIS — K219 Gastro-esophageal reflux disease without esophagitis: Secondary | ICD-10-CM | POA: Diagnosis present

## 2024-01-12 DIAGNOSIS — Z87442 Personal history of urinary calculi: Secondary | ICD-10-CM

## 2024-01-12 DIAGNOSIS — I5022 Chronic systolic (congestive) heart failure: Secondary | ICD-10-CM | POA: Diagnosis present

## 2024-01-12 DIAGNOSIS — Z79899 Other long term (current) drug therapy: Secondary | ICD-10-CM | POA: Diagnosis not present

## 2024-01-12 DIAGNOSIS — D75838 Other thrombocytosis: Secondary | ICD-10-CM | POA: Diagnosis present

## 2024-01-12 DIAGNOSIS — I6601 Occlusion and stenosis of right middle cerebral artery: Secondary | ICD-10-CM | POA: Diagnosis not present

## 2024-01-12 DIAGNOSIS — I639 Cerebral infarction, unspecified: Secondary | ICD-10-CM

## 2024-01-12 DIAGNOSIS — R29810 Facial weakness: Secondary | ICD-10-CM | POA: Diagnosis present

## 2024-01-12 DIAGNOSIS — Z888 Allergy status to other drugs, medicaments and biological substances status: Secondary | ICD-10-CM

## 2024-01-12 DIAGNOSIS — N183 Chronic kidney disease, stage 3 unspecified: Secondary | ICD-10-CM | POA: Diagnosis not present

## 2024-01-12 DIAGNOSIS — I7090 Unspecified atherosclerosis: Secondary | ICD-10-CM | POA: Diagnosis not present

## 2024-01-12 DIAGNOSIS — Z7982 Long term (current) use of aspirin: Secondary | ICD-10-CM

## 2024-01-12 DIAGNOSIS — Z7401 Bed confinement status: Secondary | ICD-10-CM | POA: Diagnosis not present

## 2024-01-12 DIAGNOSIS — N1832 Chronic kidney disease, stage 3b: Secondary | ICD-10-CM | POA: Diagnosis present

## 2024-01-12 DIAGNOSIS — M069 Rheumatoid arthritis, unspecified: Secondary | ICD-10-CM | POA: Diagnosis present

## 2024-01-12 DIAGNOSIS — I63411 Cerebral infarction due to embolism of right middle cerebral artery: Secondary | ICD-10-CM | POA: Diagnosis present

## 2024-01-12 DIAGNOSIS — I480 Paroxysmal atrial fibrillation: Secondary | ICD-10-CM

## 2024-01-12 DIAGNOSIS — I6522 Occlusion and stenosis of left carotid artery: Secondary | ICD-10-CM | POA: Diagnosis present

## 2024-01-12 DIAGNOSIS — D500 Iron deficiency anemia secondary to blood loss (chronic): Secondary | ICD-10-CM | POA: Diagnosis present

## 2024-01-12 DIAGNOSIS — G8194 Hemiplegia, unspecified affecting left nondominant side: Secondary | ICD-10-CM | POA: Diagnosis present

## 2024-01-12 DIAGNOSIS — Z800 Family history of malignant neoplasm of digestive organs: Secondary | ICD-10-CM

## 2024-01-12 DIAGNOSIS — I10 Essential (primary) hypertension: Secondary | ICD-10-CM | POA: Diagnosis not present

## 2024-01-12 DIAGNOSIS — I340 Nonrheumatic mitral (valve) insufficiency: Secondary | ICD-10-CM | POA: Diagnosis present

## 2024-01-12 DIAGNOSIS — Z85828 Personal history of other malignant neoplasm of skin: Secondary | ICD-10-CM | POA: Diagnosis not present

## 2024-01-12 DIAGNOSIS — Z96642 Presence of left artificial hip joint: Secondary | ICD-10-CM | POA: Diagnosis present

## 2024-01-12 DIAGNOSIS — I4891 Unspecified atrial fibrillation: Secondary | ICD-10-CM | POA: Diagnosis not present

## 2024-01-12 DIAGNOSIS — E785 Hyperlipidemia, unspecified: Secondary | ICD-10-CM | POA: Diagnosis not present

## 2024-01-12 DIAGNOSIS — E782 Mixed hyperlipidemia: Secondary | ICD-10-CM | POA: Diagnosis present

## 2024-01-12 DIAGNOSIS — I493 Ventricular premature depolarization: Secondary | ICD-10-CM | POA: Diagnosis present

## 2024-01-12 DIAGNOSIS — I2510 Atherosclerotic heart disease of native coronary artery without angina pectoris: Secondary | ICD-10-CM

## 2024-01-12 DIAGNOSIS — I3139 Other pericardial effusion (noninflammatory): Secondary | ICD-10-CM | POA: Diagnosis present

## 2024-01-12 DIAGNOSIS — G4733 Obstructive sleep apnea (adult) (pediatric): Secondary | ICD-10-CM | POA: Diagnosis present

## 2024-01-12 DIAGNOSIS — I69391 Dysphagia following cerebral infarction: Secondary | ICD-10-CM | POA: Diagnosis not present

## 2024-01-12 DIAGNOSIS — R29716 NIHSS score 16: Secondary | ICD-10-CM | POA: Diagnosis not present

## 2024-01-12 DIAGNOSIS — Z86718 Personal history of other venous thrombosis and embolism: Secondary | ICD-10-CM | POA: Diagnosis not present

## 2024-01-12 HISTORY — PX: IR PERCUTANEOUS ART THROMBECTOMY/INFUSION INTRACRANIAL INC DIAG ANGIO: IMG6087

## 2024-01-12 HISTORY — PX: IR CT HEAD LTD: IMG2386

## 2024-01-12 HISTORY — DX: Cerebral infarction, unspecified: I63.9

## 2024-01-12 HISTORY — PX: RADIOLOGY WITH ANESTHESIA: SHX6223

## 2024-01-12 HISTORY — PX: IR US GUIDE VASC ACCESS RIGHT: IMG2390

## 2024-01-12 LAB — COMPREHENSIVE METABOLIC PANEL WITH GFR
ALT: 26 U/L (ref 0–44)
AST: 31 U/L (ref 15–41)
Albumin: 4 g/dL (ref 3.5–5.0)
Alkaline Phosphatase: 95 U/L (ref 38–126)
Anion gap: 14 (ref 5–15)
BUN: 18 mg/dL (ref 8–23)
CO2: 23 mmol/L (ref 22–32)
Calcium: 8.9 mg/dL (ref 8.9–10.3)
Chloride: 103 mmol/L (ref 98–111)
Creatinine, Ser: 1.85 mg/dL — ABNORMAL HIGH (ref 0.61–1.24)
GFR, Estimated: 37 mL/min — ABNORMAL LOW (ref >60)
Glucose, Bld: 95 mg/dL (ref 70–99)
Potassium: 3.1 mmol/L — ABNORMAL LOW (ref 3.5–5.1)
Sodium: 139 mmol/L (ref 135–145)
Total Bilirubin: 0.6 mg/dL (ref 0.0–1.2)
Total Protein: 6.7 g/dL (ref 6.5–8.1)

## 2024-01-12 LAB — URINE DRUG SCREEN
Amphetamines: NEGATIVE
Barbiturates: NEGATIVE
Benzodiazepines: NEGATIVE
Cocaine: NEGATIVE
Fentanyl: NEGATIVE
Methadone Scn, Ur: NEGATIVE
Opiates: NEGATIVE
Tetrahydrocannabinol: NEGATIVE

## 2024-01-12 LAB — I-STAT CHEM 8, ED
BUN: 19 mg/dL (ref 8–23)
Calcium, Ion: 1.1 mmol/L — ABNORMAL LOW (ref 1.15–1.40)
Chloride: 103 mmol/L (ref 98–111)
Creatinine, Ser: 2 mg/dL — ABNORMAL HIGH (ref 0.61–1.24)
Glucose, Bld: 94 mg/dL (ref 70–99)
HCT: 38 % — ABNORMAL LOW (ref 39.0–52.0)
Hemoglobin: 12.9 g/dL — ABNORMAL LOW (ref 13.0–17.0)
Potassium: 3.1 mmol/L — ABNORMAL LOW (ref 3.5–5.1)
Sodium: 141 mmol/L (ref 135–145)
TCO2: 22 mmol/L (ref 22–32)

## 2024-01-12 LAB — DIFFERENTIAL
Abs Immature Granulocytes: 0.04 K/uL (ref 0.00–0.07)
Basophils Absolute: 0 K/uL (ref 0.0–0.1)
Basophils Relative: 0 %
Eosinophils Absolute: 0.1 K/uL (ref 0.0–0.5)
Eosinophils Relative: 1 %
Immature Granulocytes: 1 %
Lymphocytes Relative: 9 %
Lymphs Abs: 0.5 K/uL — ABNORMAL LOW (ref 0.7–4.0)
Monocytes Absolute: 0.7 K/uL (ref 0.1–1.0)
Monocytes Relative: 13 %
Neutro Abs: 4 K/uL (ref 1.7–7.7)
Neutrophils Relative %: 76 %

## 2024-01-12 LAB — CBC
HCT: 37.9 % — ABNORMAL LOW (ref 39.0–52.0)
Hemoglobin: 12.6 g/dL — ABNORMAL LOW (ref 13.0–17.0)
MCH: 33.2 pg (ref 26.0–34.0)
MCHC: 33.2 g/dL (ref 30.0–36.0)
MCV: 99.7 fL (ref 80.0–100.0)
Platelets: 520 K/uL — ABNORMAL HIGH (ref 150–400)
RBC: 3.8 MIL/uL — ABNORMAL LOW (ref 4.22–5.81)
RDW: 17.2 % — ABNORMAL HIGH (ref 11.5–15.5)
WBC: 5.2 K/uL (ref 4.0–10.5)
nRBC: 0 % (ref 0.0–0.2)

## 2024-01-12 LAB — CBG MONITORING, ED: Glucose-Capillary: 79 mg/dL (ref 70–99)

## 2024-01-12 LAB — APTT: aPTT: 29 s (ref 24–36)

## 2024-01-12 LAB — PROTIME-INR
INR: 1.1 (ref 0.8–1.2)
Prothrombin Time: 15.3 s — ABNORMAL HIGH (ref 11.4–15.2)

## 2024-01-12 LAB — ETHANOL: Alcohol, Ethyl (B): <15 mg/dL (ref <15)

## 2024-01-12 SURGERY — RADIOLOGY WITH ANESTHESIA
Anesthesia: General

## 2024-01-12 MED ORDER — SODIUM CHLORIDE 0.9 % IV SOLN: INTRAVENOUS | Status: DC | PRN

## 2024-01-12 MED ORDER — STROKE: EARLY STAGES OF RECOVERY BOOK
Freq: Once | Status: AC
  Filled 2024-01-12: qty 1

## 2024-01-12 MED ORDER — LABETALOL HCL 5 MG/ML IV SOLN
INTRAVENOUS | Status: DC | PRN
Start: 2024-01-12 — End: 2024-01-12
  Administered 2024-01-12: 10 mg via INTRAVENOUS

## 2024-01-12 MED ORDER — DEXAMETHASONE SOD PHOSPHATE PF 10 MG/ML IJ SOLN
INTRAMUSCULAR | Status: DC | PRN
  Administered 2024-01-12: 10 mg via INTRAVENOUS

## 2024-01-12 MED ORDER — ACETAMINOPHEN 160 MG/5ML PO SOLN: 650.0000 mg | ORAL | Status: DC | PRN

## 2024-01-12 MED ORDER — PHENYLEPHRINE HCL-NACL 20-0.9 MG/250ML-% IV SOLN
INTRAVENOUS | Status: DC | PRN
  Administered 2024-01-12: 50 ug/min via INTRAVENOUS

## 2024-01-12 MED ORDER — SUGAMMADEX SODIUM 200 MG/2ML IV SOLN
INTRAVENOUS | Status: DC | PRN
  Administered 2024-01-12: 200 mg via INTRAVENOUS

## 2024-01-12 MED ORDER — SODIUM CHLORIDE 0.9 % IV SOLN: INTRAVENOUS | Status: DC

## 2024-01-12 MED ORDER — ROCURONIUM 10MG/ML (10ML) SYRINGE FOR MEDFUSION PUMP - OPTIME
INTRAVENOUS | Status: DC | PRN
  Administered 2024-01-12: 30 mg via INTRAVENOUS

## 2024-01-12 MED ORDER — ONDANSETRON HCL 4 MG/2ML IJ SOLN
INTRAMUSCULAR | Status: DC | PRN
  Administered 2024-01-12: 4 mg via INTRAVENOUS

## 2024-01-12 MED ORDER — CLEVIDIPINE BUTYRATE 0.5 MG/ML IV EMUL: 0.0000 mg/h | INTRAVENOUS | Status: DC

## 2024-01-12 MED ORDER — METHOTREXATE SODIUM 2.5 MG PO TABS: 15.0000 mg | ORAL_TABLET | ORAL | Status: DC

## 2024-01-12 MED ORDER — MEXILETINE HCL 150 MG PO CAPS
150.0000 mg | ORAL_CAPSULE | Freq: Three times a day (TID) | ORAL | Status: DC
  Administered 2024-01-13 – 2024-01-15 (×8): 150 mg via ORAL
  Filled 2024-01-12 (×10): qty 1

## 2024-01-12 MED ORDER — MEXILETINE HCL 150 MG PO CAPS
150.0000 mg | ORAL_CAPSULE | Freq: Two times a day (BID) | ORAL | Status: DC
Start: 2024-01-12 — End: 2024-01-12

## 2024-01-12 MED ORDER — SUCCINYLCHOLINE 20MG/ML (10ML) SYRINGE FOR MEDFUSION PUMP - OPTIME
INTRAMUSCULAR | Status: DC | PRN
  Administered 2024-01-12: 120 mg via INTRAVENOUS

## 2024-01-12 MED ORDER — LIDOCAINE HCL (CARDIAC) PF 100 MG/5ML IV SOSY
PREFILLED_SYRINGE | INTRAVENOUS | Status: DC | PRN
  Administered 2024-01-12: 80 mg via INTRAVENOUS

## 2024-01-12 MED ORDER — PROPOFOL 10 MG/ML IV BOLUS
INTRAVENOUS | Status: DC | PRN
  Administered 2024-01-12: 70 mg via INTRAVENOUS

## 2024-01-12 MED ORDER — ACETAMINOPHEN 325 MG PO TABS
650.0000 mg | ORAL_TABLET | ORAL | Status: DC | PRN
  Administered 2024-01-13: 650 mg via ORAL
  Filled 2024-01-12: qty 2

## 2024-01-12 MED ORDER — IOHEXOL 350 MG/ML SOLN
60.0000 mL | Freq: Once | INTRAVENOUS | Status: AC | PRN
  Administered 2024-01-12: 60 mL via INTRAVENOUS

## 2024-01-12 MED ORDER — IOHEXOL 300 MG/ML  SOLN: 150.0000 mL | Freq: Once | INTRAMUSCULAR | Status: DC | PRN

## 2024-01-12 MED ORDER — POTASSIUM CHLORIDE CRYS ER 20 MEQ PO TBCR
20.0000 meq | EXTENDED_RELEASE_TABLET | Freq: Two times a day (BID) | ORAL | Status: DC
  Administered 2024-01-13 (×2): 20 meq via ORAL
  Filled 2024-01-12 (×4): qty 1

## 2024-01-12 MED ORDER — ACETAMINOPHEN 650 MG RE SUPP
650.0000 mg | RECTAL | Status: DC | PRN
Start: 2024-01-12 — End: 2024-01-15

## 2024-01-12 MED ORDER — ASPIRIN 300 MG RE SUPP
300.0000 mg | Freq: Every day | RECTAL | Status: DC
Start: 2024-01-12 — End: 2024-01-14
  Filled 2024-01-12: qty 1

## 2024-01-12 MED ORDER — SODIUM CHLORIDE 0.9 % IV BOLUS: 250.0000 mL | INTRAVENOUS | Status: AC | PRN

## 2024-01-12 MED ORDER — ASPIRIN 325 MG PO TABS
325.0000 mg | ORAL_TABLET | Freq: Every day | ORAL | Status: DC
  Administered 2024-01-12 – 2024-01-13 (×2): 325 mg via ORAL
  Filled 2024-01-12 (×3): qty 1

## 2024-01-12 MED ORDER — POTASSIUM CHLORIDE 10 MEQ/100ML IV SOLN
10.0000 meq | INTRAVENOUS | Status: AC
  Administered 2024-01-13 (×2): 10 meq via INTRAVENOUS
  Filled 2024-01-12 (×2): qty 100

## 2024-01-12 NOTE — H&P (Signed)
 Neurology H&P  CC: Left-sided weakness  History is obtained from: Chart review  HPI: Brent Ray. is a 78 y.o. male with a history of atrial fibrillation, who was recently stopped from his anticoagulation due to rectal bleeding who presented with left-sided weakness.  The patient is not aware of his deficits and is therefore unhelpful and contributing to history.  Per the teleneurology note, last known well was at 2 PM and was found to have left-sided weakness.  He was found to have a right M2 occlusion.  Aspects was 10 and given that the procedure would be started within 6 hours, no perfusion was obtained and he was transferred emergently for thrombectomy.  On arrival to [facility], I evaluated him prior to the procedure, and he had continued left-sided weakness with severe neglect.  LKW: 2 PM tpa given?: No, recent GI bleed IR Thrombectomy?  Yes Modified Rankin Scale: 1-No significant post stroke disability and can perform usual duties with stroke symptoms NIHSS score: 16 1A: Level of Consciousness -zero 1B: Ask Month and Age -zero 1C: 'Blink Eyes' & 'Squeeze Hands' -zero 2: Test Horizontal Extraocular Movements -two 3: Test Visual Fields -zero 4: Test Facial Palsy -two 5A: Test Left Arm Motor Drift -four 5B: Test Right Arm Motor Drift -zero 6A: Test Left Leg Motor Drift -three (wiggles toes) 6B: Test Right Leg Motor Drift -zero 7: Test Limb Ataxia -zero 8: Test Sensation -two 9: Test Language/Aphasia-zero 10: Test Dysarthria -one 11: Test Extinction/Inattention -two      Past Medical History:  Diagnosis Date   A-fib (HCC)    AICD (automatic cardioverter/defibrillator) present    Barrett's esophagus 2011   In [place] . Records in Epic   CHF (congestive heart failure) (HCC)    Chronic kidney disease    Coronary atherosclerosis of native coronary artery    Nonobstructive   Essential hypertension, benign    GERD (gastroesophageal reflux disease)    Heart block AV  first degree    Chronic   History of kidney stones    Hypercholesterolemia    Mixed hyperlipidemia    NSVT (nonsustained ventricular tachycardia) (HCC)    Holter monitor, 2011; recommended RF ablation by Dr. Kelsie - never required   PAF (paroxysmal atrial fibrillation) [facility])    Recently documented in [facility] [place] June 2016   Pre-diabetes    Rheumatoid arthritis (HCC)    Sinus bradycardia    Symptomatic, acebutolol discontinued   Skin cancer    Sleep apnea    pt denies     Family History  Problem Relation Age of Onset   Colon cancer Mother    Other Sister         AGE-72-HEALTHY   Other Sister        AGE 36-HEALTHY   Other Sister        AGE 5 HEALTHY   Other Son        AGE 78 HEALTHY     Social History:  reports that he has never smoked. He has never been exposed to tobacco smoke. He has never used smokeless tobacco. He reports that he does not currently use alcohol  after a past usage of about 2.0 standard drinks of alcohol  per week. He reports that he does not use drugs.   Prior to Admission medications   Medication Sig Start Date End Date Taking? Authorizing Provider  acetaminophen  (TYLENOL ) 325 MG tablet Take 650 mg by mouth as needed for mild pain (pain score 1-3).    [provider]  apixaban  (ELIQUIS ) 2.5 MG TABS tablet Take 1 tablet (2.5 mg total) by mouth 2 (two) times daily. 08/29/23   Odell Celinda Balo, MD  chlorhexidine  (PERIDEX ) 0.12 % solution Use as directed 5 mLs in the mouth or throat 2 (two) times daily. 12/03/23   [provider]  CRANBERRY PO Take 500 mg by mouth daily.    [provider]  ferrous sulfate 325 (65 FE) MG tablet Take 1 tablet (325 mg total) by mouth 2 (two) times daily with a meal. Patient taking differently: Take 325 mg by mouth every other day. 01/06/24 04/05/24  Pokhrel, Vernal, MD  folic acid  (FOLVITE ) 1 MG tablet Take 1 mg by mouth daily.  NOT on Friday d/t methotrexate  12/13/22   [provider]   furosemide  (LASIX ) 20 MG tablet Take 1 tablet (20 mg total) by mouth 2 (two) times daily. 01/08/24   Milford, Harlene HERO, FNP  hydrocortisone  (ANUSOL -HC) 2.5 % rectal cream Place 1 Application rectally 2 (two) times daily. Patient not taking: Reported on 01/08/2024 01/07/24   Haviland, Julie, MD  ipratropium (ATROVENT) 0.06 % nasal spray Place 2 sprays into both nostrils daily as needed for rhinitis.    [provider]  losartan  (COZAAR ) 25 MG tablet Take 0.5 tablets (12.5 mg total) by mouth at bedtime. 01/08/24   Glena Harlene HERO, FNP  methotrexate  (RHEUMATREX) 2.5 MG tablet Take 2.5 mg by mouth every Friday. 6 pills weekly    [provider]  mexiletine (MEXITIL ) 150 MG capsule Take 150 mg by mouth 2 (two) times daily.    [provider]  NITROSTAT  0.4 MG SL tablet DISSOLVE 1 TABLET UNDER TONGUE EVERY 5 MINUTES AS NEEDED FOR CHEST PAIN Patient taking differently: Place 0.4 mg under the tongue every 5 (five) minutes as needed for chest pain. 05/11/14   Debera Jayson MATSU, MD  ondansetron  (ZOFRAN ) 4 MG tablet Take 4 mg by mouth every 8 (eight) hours as needed for nausea. 01/15/23   [provider]  pantoprazole  (PROTONIX ) 40 MG tablet Take 1 tablet (40 mg total) by mouth 2 (two) times daily before a meal. Patient taking differently: Take 40 mg by mouth as needed. 01/06/24 03/06/24  Pokhrel, Laxman, MD  potassium chloride  SA (KLOR-CON  M) 20 MEQ tablet Take 1 tablet (20 mEq total) by mouth 2 (two) times daily. 01/08/24   Glena Harlene HERO, FNP  Probiotic Product (PROBIOTIC DAILY PO) Take 110 mg by mouth daily.    [provider]  rosuvastatin  (CRESTOR ) 5 MG tablet Take 5 mg by mouth every Sunday. Patient not taking: Reported on 01/08/2024    [provider]  senna-docusate (SENOKOT-S) 8.6-50 MG tablet Take 2 tablets by mouth at bedtime. 01/06/24 04/05/24  Pokhrel, Laxman, MD  sodium bicarbonate  650 MG tablet Take 650 mg by mouth daily.     [provider]  sucralfate (CARAFATE) 1 GM/10ML suspension Take 10 mLs (1 g total) by mouth 3 (three) times daily for 5 days. 01/06/24 01/11/24  Pokhrel, Laxman, MD  traMADol (ULTRAM) 50 MG tablet Take 50 mg by mouth every 6 (six) hours as needed for moderate pain (pain score 4-6).    [provider]     Exam: Current vital signs: BP (!) 168/92   Pulse 69   Temp 98.1 F (36.7 C) (Oral)   Resp (!) 21   Ht 5' 8 (1.727 m)   Wt 54.3 kg   SpO2 98%   BMI 18.22 kg/m  Physical Exam  Constitutional: Appears well-developed and well-nourished.  Psych: Affect appropriate to situation Eyes: No scleral injection HENT: No OP obstruction Head: Normocephalic.  Cardiovascular: Normal rate and regular rhythm.  Respiratory: Effort normal and breath sounds normal to anterior ascultation GI: Soft.  No distension. There is no tenderness.  Skin: WDI  Neuro: Mental Status: Patient is awake, alert, oriented to person, place, month, year, and situation. He has no aphasia, but has a very dense left hemineglect Cranial Nerves: II: Visual Fields are full. Pupils are equal, round, and reactive to light.   III,IV, VI: EOMI without ptosis or diploplia.  V: Facial sensation is symmetric to temperature VII: Facial movement is symmetric.  VIII: hearing is intact to voice X: Uvula elevates symmetrically XI: Shoulder shrug is symmetric. XII: tongue is midline without atrophy or fasciculations.  Motor: He has a severe flaccid left Hemi plegia, though he is able to wiggle his toes on the left  sensory: Sensation is diminished on the left, and he also extinguishes  Cerebellar: No ataxia on finger-nose-finger on the left  I have reviewed labs in epic and the pertinent results are: Potassium 3.1 Creatinine 1.85 Platelets 520-likely reactive thrombocytosis in the setting of recent hemorrhage   I have reviewed the images obtained: CT-aspects 10, CTA-M2 occlusion  Primary  Diagnosis:  Cerebral infarction due to embolism of  right middle cerebral artery.   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  This likely represents an embolus from his A-fib.  He is undergoing thrombectomy and will be admitted to the ICU following this.  Plan: - HgbA1c, fasting lipid panel - MRI of the brain without contrast - Frequent neuro checks - Echocardiogram - Prophylactic therapy-aspirin  for now - Risk factor modification - Telemetry monitoring - PT consult, OT consult, Speech consult -Hold home antihypertensives, continue mexiletine -Replete potassium - Stroke team to follow    This patient is critically ill and at significant risk of neurological worsening, death and care requires constant monitoring of vital signs, hemodynamics,respiratory and cardiac monitoring, neurological assessment, discussion with family, other specialists and medical decision making of high complexity. I spent 38 minutes of neurocritical care time  in the care of  this patient. This was time spent independent of any time provided by nurse practitioner or PA.  Aisha Seals, MD [facility]   If 7pm- 7am, please page neurology on call as listed in AMION.

## 2024-01-12 NOTE — Anesthesia Preprocedure Evaluation (Addendum)
 Anesthesia Evaluation  Patient identified by MRN, date of birth, ID band Patient confused    Reviewed: Allergy & Precautions, NPO status , Patient's Chart, lab work & pertinent test results, Unable to perform ROS - Chart review onlyPreop documentation limited or incomplete due to emergent nature of procedure.  Airway Mallampati: II  TM Distance: >3 FB Neck ROM: Full    Dental  (+) Dental Advisory Given, Missing   Pulmonary sleep apnea    Pulmonary exam normal breath sounds clear to auscultation       Cardiovascular hypertension, + CAD and +CHF  Normal cardiovascular exam+ dysrhythmias Atrial Fibrillation and Supra Ventricular Tachycardia + Cardiac Defibrillator  Rhythm:Regular Rate:Normal     Neuro/Psych CVA (code stroke)    GI/Hepatic Neg liver ROS, PUD,GERD  ,,  Endo/Other  negative endocrine ROS    Renal/GU Renal disease (AKI)     Musculoskeletal  (+) Arthritis , Rheumatoid disorders,    Abdominal   Peds  Hematology  (+) Blood dyscrasia, anemia   Anesthesia Other Findings   Reproductive/Obstetrics                              Anesthesia Physical Anesthesia Plan  ASA: 4 and emergent  Anesthesia Plan: General   Post-op Pain Management:    Induction: Intravenous, Rapid sequence and Cricoid pressure planned  PONV Risk Score and Plan: 2 and Dexamethasone  and Ondansetron   Airway Management Planned: Oral ETT  Additional Equipment: Arterial line  Intra-op Plan:   Post-operative Plan: Possible Post-op intubation/ventilation  Informed Consent:      History available from chart only and Only emergency history available  Plan Discussed with: CRNA  Anesthesia Plan Comments:          Anesthesia Quick Evaluation

## 2024-01-12 NOTE — Procedures (Signed)
  NEUROSURGERY BRIEF THROMBECTOMY NOTE   PREOP DX: Acute stroke  POSTOP DX: Same  PROCEDURE: Right M2 thrombectomy  SURGEON: Kalayna Noy   ANESTHESIA: GETA  EBL: Minimal  Number of Passes: 1  Technique: ASPIRATION  Final TICI score: 3  Post OP blood pressure goal: SBP<160  Arterial Angioplasty or Stent: No   Anti-Platelet Therapy: No   COMPLICATIONS: No   CONDITION: Stable to recovery  FINDINGS (Full report in [facility]): 1. Successful recanalizaton of the occluded M2 segment.   Brent Ray  @today @ 8:20 PM

## 2024-01-12 NOTE — Anesthesia Postprocedure Evaluation (Signed)
 Anesthesia Post Note  Patient: Brent Ray.  Procedure(s) Performed: RADIOLOGY WITH ANESTHESIA     Patient location during evaluation: PACU Anesthesia Type: General Level of consciousness: awake and alert Pain management: pain level controlled Vital Signs Assessment: post-procedure vital signs reviewed and stable Respiratory status: spontaneous breathing, nonlabored ventilation, respiratory function stable and patient connected to nasal cannula oxygen Cardiovascular status: blood pressure returned to baseline and stable Postop Assessment: no apparent nausea or vomiting Anesthetic complications: no   No notable events documented.  Last Vitals:  Vitals:   01/12/24 2110 01/12/24 2125  BP: (!) 141/75 130/78  Pulse: 69 71  Resp: 15 (!) 27  Temp:    SpO2: 94% 93%    Last Pain:  Vitals:   01/12/24 2110  TempSrc:   PainSc: 0-No pain    LLE Motor Response: Purposeful movement (01/12/24 2125) LLE Sensation: Full sensation (01/12/24 2125) RLE Motor Response: Purposeful movement (01/12/24 2125) RLE Sensation: Full sensation (01/12/24 2125)      Garnette FORBES Skillern

## 2024-01-12 NOTE — ED Notes (Signed)
 This RN called report to the triage RN at [facility] unable to get Geologist, Engineering at [facility].

## 2024-01-12 NOTE — ED Triage Notes (Signed)
 Pt BIB EMS for CODE STROKE. Per EMS left side weakness all the way down, left side facial droop, slurred speech. Pt LKW 1400.

## 2024-01-12 NOTE — Transfer of Care (Addendum)
 Immediate Anesthesia Transfer of Care Note  Patient: Brent Ray.  Procedure(s) Performed: RADIOLOGY WITH ANESTHESIA  Patient Location: PACU  Anesthesia Type:General  Level of Consciousness: awake and alert   Airway & Oxygen Therapy: Patient Spontanous Breathing  Post-op Assessment: Report given to RN and Post -op Vital signs reviewed and stable  Post vital signs: Reviewed and stable  Last Vitals:  Vitals Value Taken Time  BP 132/76   Temp    Pulse 82   Resp    SpO2 100     Last Pain:  Vitals:   01/12/24 1825  TempSrc: Oral  PainSc:          Complications: No notable events documented.

## 2024-01-12 NOTE — Discharge Instructions (Signed)
 Presents for patient to interventional radiology at [facility].  Dr. Michaela is the accepting doctor

## 2024-01-12 NOTE — Anesthesia Procedure Notes (Signed)
 Procedure Name: Intubation Date/Time: 01/12/2024 7:46 PM  Performed by: Vaughn Zebedee HERO, CRNAPre-anesthesia Checklist: Patient identified, Emergency Drugs available, Suction available and Patient being monitored Patient Re-evaluated:Patient Re-evaluated prior to induction Oxygen Delivery Method: Circle system utilized Preoxygenation: Pre-oxygenation with 100% oxygen Induction Type: IV induction and Rapid sequence Laryngoscope Size: Glidescope and 3 Grade View: Grade I Tube type: Oral Tube size: 7.0 mm Number of attempts: 1 Airway Equipment and Method: Stylet Placement Confirmation: ETT inserted through vocal cords under direct vision, positive ETCO2 and breath sounds checked- equal and bilateral Secured at: 22 cm Tube secured with: Tape Dental Injury: Teeth and Oropharynx as per pre-operative assessment

## 2024-01-12 NOTE — Code Documentation (Signed)
 Responded to Code IR paged out on pt at [facility] where pt was found to have L facial droop, R sided gaze preference, L sided weakness/paralysis/sensory deficit, slurred speech, and L sided neglect.  LSN-1400, NIH-14, CT head negative for acute changes, CTA- R MCA distal M2 occlusion. TNK was not given d/t recent GIB. Code IR was paged out at 1856. Pt arrived to [facility] ED at 1935. NIH-16 on arrival. Pt was transported to IR, arriving at 1937.

## 2024-01-12 NOTE — ED Provider Notes (Signed)
 [place] EMERGENCY DEPARTMENT AT [facility] Provider Note   CSN: [id number] Arrival date & time: 01/12/24  1805  An emergency department physician performed an initial assessment on this suspected stroke patient at 1812.  Patient presents with: Code Stroke   Priyansh Pry. is a 78 y.o. male.  {Add pertinent medical, surgical, social history, OB history to YEP:67052} Patient started with left facial weakness and weakness in left arm and left leg around 2 PM today.   Weakness      Prior to Admission medications   Medication Sig Start Date End Date Taking? Authorizing Provider  acetaminophen  (TYLENOL ) 325 MG tablet Take 650 mg by mouth as needed for mild pain (pain score 1-3).    [provider]  apixaban  (ELIQUIS ) 2.5 MG TABS tablet Take 1 tablet (2.5 mg total) by mouth 2 (two) times daily. 08/29/23   Odell Celinda Balo, MD  chlorhexidine  (PERIDEX ) 0.12 % solution Use as directed 5 mLs in the mouth or throat 2 (two) times daily. 12/03/23   [provider]  CRANBERRY PO Take 500 mg by mouth daily.    [provider]  ferrous sulfate 325 (65 FE) MG tablet Take 1 tablet (325 mg total) by mouth 2 (two) times daily with a meal. Patient taking differently: Take 325 mg by mouth every other day. 01/06/24 04/05/24  Pokhrel, Vernal, MD  folic acid  (FOLVITE ) 1 MG tablet Take 1 mg by mouth daily.  NOT on Friday d/t methotrexate  12/13/22   [provider]  furosemide  (LASIX ) 20 MG tablet Take 1 tablet (20 mg total) by mouth 2 (two) times daily. 01/08/24   Milford, Harlene HERO, FNP  hydrocortisone  (ANUSOL -HC) 2.5 % rectal cream Place 1 Application rectally 2 (two) times daily. Patient not taking: Reported on 01/08/2024 01/07/24   Haviland, Julie, MD  ipratropium (ATROVENT) 0.06 % nasal spray Place 2 sprays into both nostrils daily as needed for rhinitis.    [provider]  losartan  (COZAAR ) 25 MG tablet Take 0.5 tablets (12.5 mg total)  by mouth at bedtime. 01/08/24   Glena Harlene HERO, FNP  methotrexate  (RHEUMATREX) 2.5 MG tablet Take 2.5 mg by mouth every Friday. 6 pills weekly    [provider]  mexiletine (MEXITIL ) 150 MG capsule Take 150 mg by mouth 2 (two) times daily.    [provider]  NITROSTAT  0.4 MG SL tablet DISSOLVE 1 TABLET UNDER TONGUE EVERY 5 MINUTES AS NEEDED FOR CHEST PAIN Patient taking differently: Place 0.4 mg under the tongue every 5 (five) minutes as needed for chest pain. 05/11/14   Debera Jayson MATSU, MD  ondansetron  (ZOFRAN ) 4 MG tablet Take 4 mg by mouth every 8 (eight) hours as needed for nausea. 01/15/23   [provider]  pantoprazole  (PROTONIX ) 40 MG tablet Take 1 tablet (40 mg total) by mouth 2 (two) times daily before a meal. Patient taking differently: Take 40 mg by mouth as needed. 01/06/24 03/06/24  Pokhrel, Laxman, MD  potassium chloride  SA (KLOR-CON  M) 20 MEQ tablet Take 1 tablet (20 mEq total) by mouth 2 (two) times daily. 01/08/24   Glena Harlene HERO, FNP  Probiotic Product (PROBIOTIC DAILY PO) Take 110 mg by mouth daily.    [provider]  rosuvastatin  (CRESTOR ) 5 MG tablet Take 5 mg by mouth every Sunday. Patient not taking: Reported on 01/08/2024    [provider]  senna-docusate (SENOKOT-S) 8.6-50 MG tablet Take 2 tablets by mouth at bedtime. 01/06/24 04/05/24  Pokhrel,  Laxman, MD  sodium bicarbonate  650 MG tablet Take 650 mg by mouth daily.    [provider]  sucralfate (CARAFATE) 1 GM/10ML suspension Take 10 mLs (1 g total) by mouth 3 (three) times daily for 5 days. 01/06/24 01/11/24  Pokhrel, Laxman, MD  traMADol (ULTRAM) 50 MG tablet Take 50 mg by mouth every 6 (six) hours as needed for moderate pain (pain score 4-6).    [provider]    Allergies: Amiodarone, Flecainide , Tamsulosin, Tamsulosin hcl, Carvedilol , Dapagliflozin, Eplerenone, Lisinopril , Metoprolol , Sacubitril-valsartan, Spironolactone , Statins, and  Sulfasalazine    Review of Systems  Neurological:  Positive for weakness.    Updated Vital Signs BP (!) 174/83   Pulse 71   Temp 98.1 F (36.7 C) (Oral)   Resp 16   Ht 5' 8 (1.727 m)   Wt 54.3 kg   SpO2 97%   BMI 18.22 kg/m   Physical Exam  (all labs ordered are listed, but only abnormal results are displayed) Labs Reviewed  CBC - Abnormal; Notable for the following components:      Result Value   RBC 3.80 (*)    Hemoglobin 12.6 (*)    HCT 37.9 (*)    RDW 17.2 (*)    Platelets 520 (*)    All other components within normal limits  DIFFERENTIAL - Abnormal; Notable for the following components:   Lymphs Abs 0.5 (*)    All other components within normal limits  I-STAT CHEM 8, ED - Abnormal; Notable for the following components:   Potassium 3.1 (*)    Creatinine, Ser 2.00 (*)    Calcium , Ion 1.10 (*)    Hemoglobin 12.9 (*)    HCT 38.0 (*)    All other components within normal limits  PROTIME-INR  APTT  COMPREHENSIVE METABOLIC PANEL WITH GFR  ETHANOL  URINE DRUG SCREEN  CBG MONITORING, ED    EKG: None  Radiology: CT ANGIO HEAD NECK W WO CM (CODE STROKE) Addendum Date: 01/12/2024 ******** ADDENDUM #1 ******** ADDENDUM: Findings also discussed with Dr. Derriana Oser via telephone at 6:57 PM on 01/12/2024. ---------------------------------------------------- Electronically signed by: Franky Stanford MD 01/12/2024 06:57 PM EDT RP Workstation: [id number]   Result Date: 01/12/2024 ******** ORIGINAL REPORT ******** EXAM: CTA Head and Neck with Intravenous Contrast. CT Head without Contrast. CLINICAL HISTORY: Neuro deficit, acute, stroke suspected. TECHNIQUE: Axial CTA images of the head and neck performed with intravenous contrast. MIP reconstructed images were created and reviewed. Axial computed tomography images of the head/brain performed without intravenous contrast. Note: Per PQRS, the description of internal carotid artery percent stenosis, including 0 percent or normal exam,  is based on [place] American Symptomatic Carotid Endarterectomy Trial (NASCET) criteria. Dose reduction technique was used including one or more of the following: automated exposure control, adjustment of mA and kV according to patient size, and/or iterative reconstruction. CONTRAST: With; 60 mL iohexol  (OMNIPAQUE ) 350 MG/ML injection. COMPARISON: 08/25/2023. FINDINGS: CT HEAD: BRAIN: No acute intraparenchymal hemorrhage. No mass lesion. No CT evidence for acute territorial infarct. No midline shift or extra-axial collection. VENTRICLES: No hydrocephalus. ORBITS: The orbits are unremarkable. SINUSES AND MASTOIDS: The paranasal sinuses and mastoid air cells are clear. CTA NECK: COMMON CAROTID ARTERIES: Right: Atherosclerotic calcification at the bifurcation. No hemodynamically significant stenosis. No dissection or occlusion. Left: Atherosclerosis at the bifurcation. No hemodynamically significant stenosis. No dissection or occlusion. INTERNAL CAROTID ARTERIES: Right: Atherosclerotic calcification extending into the proximal segment. No hemodynamically significant stenosis by NASCET criteria. No dissection or occlusion. Left: Atherosclerosis extending  into the proximal segment. No hemodynamically significant stenosis by NASCET criteria. No dissection or occlusion. VERTEBRAL ARTERIES: No significant stenosis. No dissection or occlusion. CTA HEAD: ANTERIOR CEREBRAL ARTERIES: Azygos configuration of the anterior cerebral arteries. No significant stenosis. No occlusion. No aneurysm. MIDDLE CEREBRAL ARTERIES: Right: Occlusion of a distal M2 branch in the posterior sylvian fissure (series 7, image 92). Left: Normal. No aneurysm. POSTERIOR CEREBRAL ARTERIES: Left: Multifocal narrowing of the P2 segment, but patency is maintained. Right: Normal. No aneurysm. BASILAR ARTERY: No significant stenosis. No occlusion. No aneurysm. OTHER: Aortic atherosclerosis. SOFT TISSUES: No acute finding. No masses or lymphadenopathy. BONES: No  acute osseous abnormality. IMPRESSION: 1. Occlusion of a right MCA distal M2 branch in the posterior sylvian fissure. 2. Multifocal narrowing of the left PCA P2 segment with maintained patency. 3. Atherosclerotic calcification at the right carotid bifurcation extending into the proximal internal and external carotid arteries without hemodynamically significant stenosis. 4. Left carotid bifurcation atherosclerosis without hemodynamically significant stenosis. Findings communicated to Dr. Germaine via [facility] text page at 6:30 pm on 01/12/2024. Electronically signed by: Franky Stanford MD 01/12/2024 06:34 PM EDT RP Workstation: [id number]   CT HEAD CODE STROKE WO CONTRAST (LKW 0-4.5h, LVO 0-24h) Result Date: 01/12/2024 EXAM: CT HEAD WITHOUT CONTRAST 01/12/2024 06:15:25 PM TECHNIQUE: CT of the head was performed without the administration of intravenous contrast. Automated exposure control, iterative reconstruction, and/or weight based adjustment of the mA/kV was utilized to reduce the radiation dose to as low as reasonably achievable. COMPARISON: 08/25/2023 CLINICAL HISTORY: Neuro deficit, acute, stroke suspected. FINDINGS: BRAIN AND VENTRICLES: Dilated perivascular space of the right lentiform nucleus. Chronic ischemic white matter changes. No acute hemorrhage. No evidence of acute infarct. No hydrocephalus. No extra-axial collection. No mass effect or midline shift. ORBITS: No acute abnormality. SINUSES: No acute abnormality. SOFT TISSUES AND SKULL: No acute soft tissue abnormality. No skull fracture. alberta stroke program early CT score (ASPECTS) Ganglionic (caudate, IC, lentiform nucleus, insula, M1-M3): 7 Supraganglionic (M4-M6): 3 Total: 10 IMPRESSION: 1. No acute intracranial abnormality. 2. ASPECTS: 10. Findings communicated to Dr. Germaine via [facility] text page at 6;23 PM on 01/12/2024. Electronically signed by: Franky Stanford MD 01/12/2024 06:23 PM EDT RP Workstation: [id number]    {Document cardiac monitor,  telemetry assessment procedure when appropriate:32947} Procedures   Medications Ordered in the ED  iohexol  (OMNIPAQUE ) 350 MG/ML injection 60 mL (60 mLs Intravenous Contrast Given 01/12/24 1823)     CRITICAL CARE Performed by: Fairy Sermon Total critical care time: 45 minutes Critical care time was exclusive of separately billable procedures and treating other patients. Critical care was necessary to treat or prevent imminent or life-threatening deterioration. Critical care was time spent personally by me on the following activities: development of treatment plan with patient and/or surrogate as well as nursing, discussions with consultants, evaluation of patient's response to treatment, examination of patient, obtaining history from patient or surrogate, ordering and performing treatments and interventions, ordering and review of laboratory studies, ordering and review of radiographic studies, pulse oximetry and re-evaluation of patient's condition. Stroke was called immediately and the patient was seen by neurology Dr. Germaine.  CT scan showed large vessel occlusion.  Dr. Leonarda has made arrangements for the patient to go to interventional radiology at [facility] for emergent thrombectomy {Click here for ABCD2, HEART and other calculators REFRESH Note before signing:1}  Medical Decision Making Amount and/or Complexity of Data Reviewed Labs: ordered. Radiology: ordered.   Large vessel stroke.  Patient being transferred to [facility] for thrombectomy.  Dr. Michaela is the accepting doctor {Document critical care time when appropriate  Document review of labs and clinical decision tools ie CHADS2VASC2, etc  Document your independent review of radiology images and any outside records  Document your discussion with family members, caretakers and with consultants  Document social determinants of health affecting pt's care  Document your  decision making why or why not admission, treatments were needed:32947:::1}   Final diagnoses:  None    ED Discharge Orders     None

## 2024-01-12 NOTE — Consult Note (Signed)
 [facility] Telemedicine Consult   Requesting Provider: Suzette Consult Participants: Nurse Location of the provider: [facility] Location of the patient: [facility] ED  This consult was provided via telemedicine with 2-way video and audio communication. The patient/family was informed that care would be provided in this way and agreed to receive care in this manner.    Chief Complaint: Left sided weakness  HPI:  78 y.o. 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  LKW: 1400 on 01/12/2024 tpa given?: No, Recent GIB with recent significant drop in hgb IR Thrombectomy? Yes Modified Rankin Scale: 1-No significant post stroke disability and can perform usual duties with stroke symptoms Time of teleneurologist evaluation: 1804  Exam: BP 176/103  General: NAD  1A: Level of Consciousness - 0 1B: Ask Month and Age - 0 1C: 'Blink Eyes' & 'Squeeze Hands' - 0 2: Test Horizontal Extraocular Movements - 1 3: Test Visual Fields - 0 4: Test Facial Palsy - 2 5A: Test Left Arm Motor Drift - 4 5B: Test Right Arm Motor Drift - 0 6A: Test Left Leg Motor Drift - 4 6B: Test Right Leg Motor Drift - 1 7: Test Limb Ataxia - 0 8: Test Sensation - 0 9: Test Language/Aphasia- 0 10: Test Dysarthria - 1 11: Test Extinction/Inattention - 1 NIHSS score: 14   Imaging Reviewed:  CT HEAD WITHOUT CONTRAST 01/12/2024 06:15:25 PM   TECHNIQUE: CT of the head was performed without the administration of intravenous contrast. Automated exposure control, iterative reconstruction, and/or weight based adjustment of the mA/kV was utilized to reduce the radiation dose to as low  as reasonably achievable.   COMPARISON: 08/25/2023   CLINICAL HISTORY: Neuro deficit, acute, stroke suspected.   FINDINGS:   BRAIN AND VENTRICLES: Dilated perivascular space of the right lentiform nucleus. Chronic ischemic white matter changes. No acute hemorrhage. No evidence of acute infarct. No hydrocephalus. No extra-axial collection. No mass effect or midline shift.   ORBITS: No acute abnormality.   SINUSES: No acute abnormality.   SOFT TISSUES AND SKULL: No acute soft tissue abnormality. No skull fracture.   alberta stroke program early CT score (ASPECTS)   Ganglionic (caudate, IC, lentiform nucleus, insula, M1-M3): 7   Supraganglionic (M4-M6): 3   Total: 10   IMPRESSION: 1. No acute intracranial abnormality. 2. ASPECTS: 10. Findings communicated to Dr. Germaine via [facility] text page at 6;23 PM on 01/12/2024.  EXAM: CTA Head and Neck with Intravenous Contrast. CT Head without Contrast.   CLINICAL HISTORY: Neuro deficit, acute, stroke suspected.   TECHNIQUE: Axial CTA images of the head and neck performed with intravenous contrast. MIP reconstructed images were created and reviewed. Axial computed tomography images of the head/brain performed without intravenous contrast.   Note: Per PQRS, the description of internal carotid artery percent stenosis, including 0 percent or normal exam, is based on [employer] American Symptomatic Carotid Endarterectomy Trial (NASCET) criteria. Dose reduction technique was used including one or more of the following: automated exposure control, adjustment of mA and kV according to patient size, and/or iterative reconstruction.   CONTRAST: With; 60 mL iohexol  (OMNIPAQUE ) 350 MG/ML injection.   COMPARISON: 08/25/2023.   FINDINGS:   CT HEAD: BRAIN: No acute intraparenchymal hemorrhage. No  mass lesion. No CT evidence for acute territorial infarct. No midline shift or extra-axial collection.   VENTRICLES: No  hydrocephalus.   ORBITS: The orbits are unremarkable.   SINUSES AND MASTOIDS: The paranasal sinuses and mastoid air cells are clear.   CTA NECK: COMMON CAROTID ARTERIES: Right: Atherosclerotic calcification at the bifurcation. No hemodynamically significant stenosis. No dissection or occlusion. Left: Atherosclerosis at the bifurcation. No hemodynamically significant stenosis. No dissection or occlusion.   INTERNAL CAROTID ARTERIES: Right: Atherosclerotic calcification extending into the proximal segment. No hemodynamically significant stenosis by NASCET criteria. No dissection or occlusion. Left: Atherosclerosis extending into the proximal segment. No hemodynamically significant stenosis by NASCET criteria. No dissection or occlusion.   VERTEBRAL ARTERIES: No significant stenosis. No dissection or occlusion.   CTA HEAD: ANTERIOR CEREBRAL ARTERIES: Azygos configuration of the anterior cerebral arteries. No significant stenosis. No occlusion. No aneurysm.   MIDDLE CEREBRAL ARTERIES: Right: Occlusion of a distal M2 branch in the posterior sylvian fissure (series 7, image 92). Left: Normal. No aneurysm.   POSTERIOR CEREBRAL ARTERIES: Left: Multifocal narrowing of the P2 segment, but patency is maintained. Right: Normal. No aneurysm.   BASILAR ARTERY: No significant stenosis. No occlusion. No aneurysm.   OTHER: Aortic atherosclerosis.   SOFT TISSUES: No acute finding. No masses or lymphadenopathy.   BONES: No acute osseous abnormality.   IMPRESSION: 1. Occlusion of a right MCA distal M2 branch in the posterior sylvian fissure. 2. Multifocal narrowing of the left PCA P2 segment with maintained patency. 3. Atherosclerotic calcification at the right carotid bifurcation extending into the proximal internal and external carotid arteries without hemodynamically significant stenosis. 4. Left carotid bifurcation atherosclerosis without hemodynamically  significant stenosis.    Labs reviewed in epic and pertinent values follow: CBG 79   Assessment: 78 y.o. 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  NIHSS of 14.  Head CT personally reviewed and shows no acute changes.  CTA of the head and neck personally reviewed and reveal a distal M2 occlusion.  Case discussed with Dr. Ray and conversation had with Dr. Ray, patient and family discussing thrombectomy procedure, risks, benefits and alternative therapies.  Patient and family agree to thrombectomy.    Recommendations:  Transfer to [facility] for possible thrombectomy.  Neurology made aware.      This patient is receiving care for possible acute neurological changes. Care by this provider at the time of service included time for direct evaluation via telemedicine, review of medical records, imaging studies and discussion of findings with providers, the patient and/or family.  Sonny Hock, MD Neurology   If 8pm- 8am, please page neurology on call as listed in AMION.

## 2024-01-13 ENCOUNTER — Other Ambulatory Visit (HOSPITAL_COMMUNITY): Payer: Self-pay

## 2024-01-13 ENCOUNTER — Inpatient Hospital Stay (HOSPITAL_COMMUNITY)

## 2024-01-13 DIAGNOSIS — I7090 Unspecified atherosclerosis: Secondary | ICD-10-CM

## 2024-01-13 DIAGNOSIS — R29700 NIHSS score 0: Secondary | ICD-10-CM

## 2024-01-13 DIAGNOSIS — E785 Hyperlipidemia, unspecified: Secondary | ICD-10-CM

## 2024-01-13 DIAGNOSIS — I4821 Permanent atrial fibrillation: Secondary | ICD-10-CM | POA: Diagnosis not present

## 2024-01-13 DIAGNOSIS — I69391 Dysphagia following cerebral infarction: Secondary | ICD-10-CM

## 2024-01-13 DIAGNOSIS — I63411 Cerebral infarction due to embolism of right middle cerebral artery: Secondary | ICD-10-CM | POA: Diagnosis not present

## 2024-01-13 LAB — APTT: aPTT: 54 s — ABNORMAL HIGH (ref 24–36)

## 2024-01-13 LAB — HEPARIN LEVEL (UNFRACTIONATED): Heparin Unfractionated: 0.17 [IU]/mL — ABNORMAL LOW (ref 0.30–0.70)

## 2024-01-13 LAB — LIPID PANEL
Cholesterol: 134 mg/dL (ref 0–200)
HDL: 51 mg/dL (ref >40)
LDL Cholesterol: 71 mg/dL (ref 0–99)
Total CHOL/HDL Ratio: 2.6 ratio
Triglycerides: 62 mg/dL (ref <150)
VLDL: 12 mg/dL (ref 0–40)

## 2024-01-13 LAB — MRSA NEXT GEN BY PCR, NASAL: MRSA by PCR Next Gen: NOT DETECTED

## 2024-01-13 MED ORDER — POTASSIUM CHLORIDE CRYS ER 20 MEQ PO TBCR
40.0000 meq | EXTENDED_RELEASE_TABLET | Freq: Once | ORAL | Status: AC
  Administered 2024-01-13: 40 meq via ORAL
  Filled 2024-01-13: qty 2

## 2024-01-13 MED ORDER — ORAL CARE MOUTH RINSE: 15.0000 mL | OROMUCOSAL | Status: DC | PRN

## 2024-01-13 MED ORDER — POTASSIUM CHLORIDE 10 MEQ/100ML IV SOLN
10.0000 meq | INTRAVENOUS | Status: AC
  Administered 2024-01-13 (×2): 10 meq via INTRAVENOUS
  Filled 2024-01-13 (×2): qty 100

## 2024-01-13 MED ORDER — HEPARIN (PORCINE) 25000 UT/250ML-% IV SOLN
750.0000 [IU]/h | INTRAVENOUS | Status: AC
  Administered 2024-01-13: 650 [IU]/h via INTRAVENOUS
  Filled 2024-01-13: qty 250

## 2024-01-13 MED ORDER — CHLORHEXIDINE GLUCONATE CLOTH 2 % EX PADS
6.0000 | MEDICATED_PAD | Freq: Every day | CUTANEOUS | Status: DC
  Administered 2024-01-13 – 2024-01-14 (×2): 6 via TOPICAL

## 2024-01-13 NOTE — Evaluation (Signed)
 Physical Therapy Evaluation Patient Details Name: Brent Ray. MRN: [id number] DOB: September 17, 1945 Today's Date: 01/13/2024  History of Present Illness  Pt is a 77 y.o. male who presented 01/12/24 with L-sided weakness and slurred speech. Not a candidate for TNK. 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 S/p successful thrombectomy 11/1. Head CT negative. PMH: CHF, a-fib, AICD, Barrett's esophagus, CKD, HTN, GERD, HLD, NSVT, RA, skin cancer, sleep apnea, heart block AV first degree   Clinical Impression  Pt presents with condition above and deficits mentioned below, see PT Problem List. PTA, he was independent without DME, living with his wife and adult son in a house with 1 STE. He can stay on the main level of the house. He currently displays deficits in L ankle dorsiflexion strength and ROM (baseline from a prior injury), balance, and activity tolerance. When he first pulled up on the bed rails he displayed some myoclonic jerking. He reports this began after his mini strokes and the MD had previously mentioned this should go away with time. Notified NP of this. He only requiring CGA for safety with all functional mobility except minA for bed mobility. He ambulates with some instability noted and intermittent stuttering steps, specifically noted when entering his room. He reports the stuttering steps are due to poor L foot clearance due to his chronic L leg injury, but the stuttering steps did not appear to be noted during his prior admission and sessions with PT in June of 2025. He is currently benefiting from using a RW for improved stability when ambulating. Educated pt of his risk for falls and recs to use a RW initially upon d/c. He verbalized understanding. He will likely progress quickly as he mobilizes more frequently. Will recommend follow-up with HHPT for now but if he progresses quickly he may not need any post acute PT. Will continue to follow acutely.      If plan is discharge home, recommend the  following: A little help with walking and/or transfers;A little help with bathing/dressing/bathroom;Assistance with cooking/housework;Assist for transportation;Help with stairs or ramp for entrance   Can travel by private vehicle        Equipment Recommendations None recommended by PT  Recommendations for Other Services       Functional Status Assessment Patient has had a recent decline in their functional status and demonstrates the ability to make significant improvements in function in a reasonable and predictable amount of time.     Precautions / Restrictions Precautions Precautions: Fall Precaution/Restrictions Comments: SBP < 160 Restrictions Weight Bearing Restrictions Per Provider Order: No      Mobility  Bed Mobility Overal bed mobility: Needs Assistance Bed Mobility: Supine to Sit     Supine to sit: HOB elevated, Min assist, Used rails     General bed mobility comments: MinA to ascend trunk due to noted myoclonic jerking when pt pulled up on bed rails    Transfers Overall transfer level: Needs assistance Equipment used: 1 person hand held assist Transfers: Sit to/from Stand Sit to Stand: Contact guard assist           General transfer comment: Pt stood from EOB with HHA, no LOB, CGA for safety, mild sway noted    Ambulation/Gait Ambulation/Gait assistance: Contact guard assist Gait Distance (Feet): 130 Feet Assistive device: Rolling walker (2 wheels), None, IV Pole Gait Pattern/deviations: Step-through pattern, Decreased step length - right, Decreased step length - left, Decreased dorsiflexion - left, Decreased stride length, Shuffle Gait velocity: reduced Gait velocity  interpretation: <1.8 ft/sec, indicate of risk for recurrent falls   General Gait Details: Pt ambulated initially with a RW, displaying moments of stuttering steps, primarily with his L, potentially due to decreased L dorsiflexion from chronic injury. Pt needed cues for feet clearance  and to stay within the RW. Pt progressed to no UE support then x1 UE support on IV pole with noted increased trunk sway compared to when using the RW. No LOB though, CGA for safety throughout  Stairs            Wheelchair Mobility     Tilt Bed    Modified Rankin (Stroke Patients Only) Modified Rankin (Stroke Patients Only) Pre-Morbid Rankin Score: No symptoms Modified Rankin: Moderately severe disability     Balance Overall balance assessment: Mild deficits observed, not formally tested                                           Pertinent Vitals/Pain Pain Assessment Pain Assessment: No/denies pain    Home Living Family/patient expects to be discharged to:: Private residence Living Arrangements: Spouse/significant other;Children (adult son) Available Help at Discharge: Family;Available 24 hours/day Type of Home: House Home Access: Stairs to enter Entrance Stairs-Rails: None Entrance Stairs-Number of Steps: 1 Alternate Level Stairs-Number of Steps: 13 Home Layout: Laundry or work area in basement;One level;Able to live on main level with bedroom/bathroom Home Equipment: Rolling Walker (2 wheels);Cane - single point;BSC/3in1;Wheelchair Writer (comment);Shower seat - built in;Grab bars - tub/shower;Grab bars - toilet (adjustable bed)      Prior Function Prior Level of Function : Independent/Modified Independent             Mobility Comments: No AD ADLs Comments: 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, 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     Extremity/Trunk Assessment   Upper Extremity Assessment Upper Extremity Assessment: Defer to OT evaluation    Lower Extremity Assessment Lower Extremity Assessment: LLE deficits/detail LLE Deficits / Details: symmetrical WFL strength in bil legs except at L ankle due to hx of L leg injury resulting in chronic L ankle dorsiflexion weakness and ROM limitations       Communication   Communication Communication: No apparent  difficulties    Cognition Arousal: Alert Behavior During Therapy: WFL for tasks assessed/performed   PT - Cognitive impairments: No apparent impairments                       PT - Cognition Comments: Wife reports pt is at his baseline Following commands: Intact       Cueing Cueing Techniques: Verbal cues, Tactile cues     General Comments General comments (skin integrity, edema, etc.): VSS; educated pt of his risk for falls and recs to use a RW initially upon d/c, he verbalized understanding    Exercises     Assessment/Plan    PT Assessment Patient needs continued PT services  PT Problem List Decreased strength;Decreased activity tolerance;Decreased balance;Decreased mobility;Decreased knowledge of use of DME       PT Treatment Interventions DME instruction;Gait training;Stair training;Functional mobility training;Therapeutic activities;Therapeutic exercise;Neuromuscular re-education;Balance training;Patient/family education    PT Goals (Current goals can be found in the Care Plan section)  Acute Rehab PT Goals Patient Stated Goal: to get back to his baseline PT Goal Formulation: With patient/family Time For Goal Achievement: 01/27/24 Potential to Achieve Goals: Good    Frequency  Min 2X/week     Co-evaluation               AM-PAC PT 6 Clicks Mobility  Outcome Measure Help needed turning from your back to your side while in a flat bed without using bedrails?: A Little Help needed moving from lying on your back to sitting on the side of a flat bed without using bedrails?: A Little Help needed moving to and from a bed to a chair (including a wheelchair)?: A Little Help needed standing up from a chair using your arms (e.g., wheelchair or bedside chair)?: A Little Help needed to walk in hospital room?: A Little Help needed climbing 3-5 steps with a railing? : A Little 6 Click Score: 18    End of Session Equipment Utilized During Treatment: Gait  belt Activity Tolerance: Patient tolerated treatment well Patient left: in chair;with call bell/phone within reach;with chair alarm set;with family/visitor present Nurse Communication: Mobility status PT Visit Diagnosis: Unsteadiness on feet (R26.81);Other abnormalities of gait and mobility (R26.89);Muscle weakness (generalized) (M62.81);Difficulty in walking, not elsewhere classified (R26.2)    Time: 9092-9066 PT Time Calculation (min) (ACUTE ONLY): 26 min   Charges:   PT Evaluation $PT Eval Low Complexity: 1 Low PT Treatments $Gait Training: 8-22 mins PT General Charges $$ ACUTE PT VISIT: 1 Visit         Theo Ferretti, PT, DPT Acute Rehabilitation Services  Office: [phone]   Theo CHRISTELLA Ferretti 01/13/2024, 1:58 PM

## 2024-01-13 NOTE — Progress Notes (Addendum)
 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  No activity restriction from femoral access standpoint.  PT/OT eval. Follow up CT / MRI today and echo today.  Evaluation of potential etiologies and risk factor optomization. Please call with questions, concerns, or change in patient condition.  Overnight Events: Improvement in clinical symptoms  Subjective: I have some soreness in my IV. IV site related pain.  Restored motor function to the left upper and lower extremities.  Objective: Physical Exam: BP 123/75   Pulse 70   Temp 97.8 F (36.6 C) (Axillary)   Resp 19   Ht 5' 8 (1.727 m)   Wt 54.3 kg   SpO2 100%   BMI 18.22 kg/m   Persistent facial droop Answers questions appropriately Restored motor and sensory function of the LUE and LLE Access site C/D/I Distal extremity warm and well perfused   Current Facility-Administered Medications:    0.9 %  sodium chloride  infusion, , Intravenous, Continuous, Michaela Aisha SQUIBB, MD, Last Rate: 50 mL/hr at 01/13/24 0500, Infusion Verify at 01/13/24 0500   acetaminophen  (TYLENOL ) tablet 650 mg, 650 mg, Oral, Q4H PRN, 650 mg at 01/13/24 0037 **OR** acetaminophen  (TYLENOL ) 160 MG/5ML solution 650 mg, 650 mg, Per Tube, Q4H PRN **OR** acetaminophen  (TYLENOL ) suppository 650 mg, 650 mg, Rectal, Q4H PRN, Michaela Aisha SQUIBB, MD   aspirin  suppository 300 mg, 300 mg, Rectal, Daily **OR** aspirin  tablet 325 mg, 325 mg, Oral, Daily, Michaela Aisha SQUIBB, MD, 325 mg at 01/12/24 2354   clevidipine  (CLEVIPREX ) infusion 0.5 mg/mL, 0-21 mg/hr, Intravenous, Continuous, Ray Coy, MD   iohexol  (OMNIPAQUE ) 300 MG/ML solution 150 mL, 150 mL, Intra-arterial, Once PRN, Ray Coy, MD   NOREEN ON 01/18/2024] methotrexate  (RHEUMATREX) tablet 15 mg, 15 mg, Oral, Q Fri, Kirkpatrick, McNeill P, MD   mexiletine (MEXITIL ) capsule 150 mg,  150 mg, Oral, Q8H, Michaela Aisha SQUIBB, MD, 150 mg at 01/13/24 0018   potassium chloride  SA (KLOR-CON  M) CR tablet 20 mEq, 20 mEq, Oral, BID, Michaela Aisha SQUIBB, MD   sodium chloride  0.9 % bolus 250 mL, 250 mL, Intravenous, PRN, Ray Coy, MD  Labs: CBC Recent Labs    01/12/24 1848 01/12/24 1850  WBC 5.2  --   HGB 12.6* 12.9*  HCT 37.9* 38.0*  PLT 520*  --    BMET Recent Labs    01/12/24 1848 01/12/24 1850  NA 139 141  K 3.1* 3.1*  CL 103 103  CO2 23  --   GLUCOSE 95 94  BUN 18 19  CREATININE 1.85* 2.00*  CALCIUM  8.9  --    LFT Recent Labs    01/12/24 1848  PROT 6.7  ALBUMIN  4.0  AST 31  ALT 26  ALKPHOS 95  BILITOT 0.6   PT/INR Recent Labs    01/12/24 1848  LABPROT 15.3*  INR 1.1    LOS: 1 day   I spent a total of 15 minutes in face to face in clinical consultation, greater than 50% of which was counseling/coordinating care.  RAY COY 01/13/2024 5:48 AM

## 2024-01-13 NOTE — Progress Notes (Signed)
 PHARMACY - ANTICOAGULATION CONSULT NOTE  Pharmacy Consult for heparin  Indication: atrial fibrillation and stroke  Allergies  Allergen Reactions   Amiodarone Other (See Comments)    Severe joint pain and blindness   Flecainide  Other (See Comments)    Severe headaches, blurred vision   Tamsulosin Hives and Itching   Tamsulosin Hcl Hives, Itching, Dermatitis and Rash   Carvedilol  Other (See Comments)    Unknown   Dapagliflozin Diarrhea   Eplerenone Other (See Comments)    Unknown   Lisinopril  Nausea And Vomiting and Nausea Only   Metoprolol  Other (See Comments)    Headaches, dizziness   Sacubitril-Valsartan Nausea And Vomiting and Nausea Only   Spironolactone  Swelling and Other (See Comments)    Breast swelling, headaches   Statins Other (See Comments)    Leg cramps   Sulfasalazine Nausea Only    Patient Measurements: Height: 5' 8 (172.7 cm) Weight: 54.3 kg (119 lb 12.8 oz) IBW/kg (Calculated) : 68.4 HEPARIN  DW (KG): 54.3  Vital Signs: Temp: 98.3 F (36.8 C) (11/02 2000) Temp Source: Oral (11/02 2000) BP: 130/81 (11/02 2200) Pulse Rate: 69 (11/02 2200)  Labs: Recent Labs    01/12/24 1848 01/12/24 1850 01/13/24 2235  HGB 12.6* 12.9*  --   HCT 37.9* 38.0*  --   PLT 520*  --   --   APTT 29  --  54*  LABPROT 15.3*  --   --   INR 1.1  --   --   HEPARINUNFRC  --   --  0.17*  CREATININE 1.85* 2.00*  --     Estimated Creatinine Clearance: 24.1 mL/min (A) (by C-G formula based on SCr of 2 mg/dL (H)).   Medical History: Past Medical History:  Diagnosis Date   A-fib [facility])    AICD (automatic cardioverter/defibrillator) present    Barrett's esophagus 2011   In [place] . Records in Epic   CHF (congestive heart failure) (HCC)    Chronic kidney disease    Coronary atherosclerosis of native coronary artery    Nonobstructive   Essential hypertension, benign    GERD (gastroesophageal reflux disease)    Heart block AV first degree    Chronic   History of kidney  stones    Hypercholesterolemia    Mixed hyperlipidemia    NSVT (nonsustained ventricular tachycardia) (HCC)    Holter monitor, 2011; recommended RF ablation by Dr. Kelsie - never required   PAF (paroxysmal atrial fibrillation) (HCC)    Recently documented in [facility] June 2016   Pre-diabetes    Rheumatoid arthritis (HCC)    Sinus bradycardia    Symptomatic, acebutolol discontinued   Skin cancer    Sleep apnea    pt denies    Medications:  Medications Prior to Admission  Medication Sig Dispense Refill Last Dose/Taking   acetaminophen  (TYLENOL ) 325 MG tablet Take 650 mg by mouth as needed for mild pain (pain score 1-3).      [Paused] apixaban  (ELIQUIS ) 2.5 MG TABS tablet Take 1 tablet (2.5 mg total) by mouth 2 (two) times daily. 60 tablet 0    chlorhexidine  (PERIDEX ) 0.12 % solution Use as directed 5 mLs in the mouth or throat 2 (two) times daily.      CRANBERRY PO Take 500 mg by mouth daily.      ferrous sulfate 325 (65 FE) MG tablet Take 1 tablet (325 mg total) by mouth 2 (two) times daily with a meal. (Patient taking differently: Take 325 mg by mouth every other day.) 180  tablet 0    folic acid  (FOLVITE ) 1 MG tablet Take 1 mg by mouth daily.  NOT on Friday d/t methotrexate       furosemide  (LASIX ) 20 MG tablet Take 1 tablet (20 mg total) by mouth 2 (two) times daily. 180 tablet 3    hydrocortisone  (ANUSOL -HC) 2.5 % rectal cream Place 1 Application rectally 2 (two) times daily. (Patient not taking: Reported on 01/08/2024) 30 g 0    ipratropium (ATROVENT) 0.06 % nasal spray Place 2 sprays into both nostrils daily as needed for rhinitis.      losartan  (COZAAR ) 25 MG tablet Take 0.5 tablets (12.5 mg total) by mouth at bedtime. 45 tablet 3    methotrexate  (RHEUMATREX) 2.5 MG tablet Take 2.5 mg by mouth every Friday. 6 pills weekly      mexiletine (MEXITIL ) 150 MG capsule Take 150 mg by mouth 2 (two) times daily.      NITROSTAT  0.4 MG SL tablet DISSOLVE 1 TABLET UNDER TONGUE EVERY 5 MINUTES  AS NEEDED FOR CHEST PAIN (Patient taking differently: Place 0.4 mg under the tongue every 5 (five) minutes as needed for chest pain.) 25 tablet 3    ondansetron  (ZOFRAN ) 4 MG tablet Take 4 mg by mouth every 8 (eight) hours as needed for nausea.      pantoprazole  (PROTONIX ) 40 MG tablet Take 1 tablet (40 mg total) by mouth 2 (two) times daily before a meal. (Patient taking differently: Take 40 mg by mouth as needed.) 120 tablet 2    potassium chloride  SA (KLOR-CON  M) 20 MEQ tablet Take 1 tablet (20 mEq total) by mouth 2 (two) times daily. 180 tablet 3    Probiotic Product (PROBIOTIC DAILY PO) Take 110 mg by mouth daily.      rosuvastatin  (CRESTOR ) 5 MG tablet Take 5 mg by mouth every Sunday. (Patient not taking: Reported on 01/08/2024)      senna-docusate (SENOKOT-S) 8.6-50 MG tablet Take 2 tablets by mouth at bedtime. 180 tablet 0    sodium bicarbonate  650 MG tablet Take 650 mg by mouth daily.      sucralfate (CARAFATE) 1 GM/10ML suspension Take 10 mLs (1 g total) by mouth 3 (three) times daily for 5 days. 150 mL 0    traMADol (ULTRAM) 50 MG tablet Take 50 mg by mouth every 6 (six) hours as needed for moderate pain (pain score 4-6).       Assessment: 59 yom  who presented to [facility] with left sided weakness and slurred speech. Code stroke was called and he was found to have right M2 occlusion. He was transferred to [facility] and is s/p right M2 thrombectomy on 11/1. He has a hx Afib/CVA and was on apixaban  but this has been on hold since recent admit 10/22-10/26 for GIB. Confirmed with patient last dose was 10/22.   No overt bleeding noted, Hgb 12s and platelets are elevated.  11/2 PM update:  Heparin  level sub-therapeutic Heparin  level and aPTT correlating-will DC aPTTs  Goal of Therapy:  Heparin  level 0.3-0.5 units/ml Monitor platelets by anticoagulation protocol: Yes   Plan:  Inc heparin  to 750 units/hr Heparin  level in 8 hours Monitor for s/sx of bleeding  Lynwood Mckusick, PharmD, BCPS Clinical  Pharmacist Phone: [phone]

## 2024-01-13 NOTE — Progress Notes (Addendum)
 STROKE TEAM PROGRESS NOTE    SIGNIFICANT HOSPITAL EVENTS 11/1 presented with left-sided weakness.  CTA with right M2 occlusion.  Underwent right M2 thrombectomy with TICI 3..  Patient has A-fib and was off anticoagulation due to significant GI hemorrhage 10 days ago  INTERIM HISTORY/SUBJECTIVE Family at the bedside.  Patient sitting in the chair in no apparent distress. Neurological exam is improved NIH 0 K 3.1 will replace and check in the morning.  Not requiring any blood pressure medication  CBC    Component Value Date/Time   WBC 5.2 01/12/2024 1848   RBC 3.80 (L) 01/12/2024 1848   HGB 12.9 (L) 01/12/2024 1850   HCT 38.0 (L) 01/12/2024 1850   PLT 520 (H) 01/12/2024 1848   MCV 99.7 01/12/2024 1848   MCH 33.2 01/12/2024 1848   MCHC 33.2 01/12/2024 1848   RDW 17.2 (H) 01/12/2024 1848   LYMPHSABS 0.5 (L) 01/12/2024 1848   MONOABS 0.7 01/12/2024 1848   EOSABS 0.1 01/12/2024 1848   BASOSABS 0.0 01/12/2024 1848    BMET    Component Value Date/Time   NA 141 01/12/2024 1850   NA 139 03/30/2023 1026   K 3.1 (L) 01/12/2024 1850   CL 103 01/12/2024 1850   CO2 23 01/12/2024 1848   GLUCOSE 94 01/12/2024 1850   BUN 19 01/12/2024 1850   BUN 28 (H) 03/30/2023 1026   CREATININE 2.00 (H) 01/12/2024 1850   CREATININE 1.33 (H) 01/24/2016 1336   CALCIUM  8.9 01/12/2024 1848   EGFR 28 (L) 03/30/2023 1026   GFRNONAA 37 (L) 01/12/2024 1848    IMAGING past 24 hours   Vitals:   01/13/24 0500 01/13/24 0600 01/13/24 0700 01/13/24 0800  BP: 123/75 131/83 127/82 130/81  Pulse: 70 69 70 70  Resp: 19 12 14 17   Temp:      TempSrc:      SpO2: 100% 98% 99% 97%  Weight:      Height:         PHYSICAL EXAM General:  Alert, well-nourished, well-developed patient in no acute distress Psych:  Mood and affect appropriate for situation CV: Regular rate and rhythm on monitor Respiratory:  Regular, unlabored respirations on room air GI: Abdomen soft and nontender   NEURO:  Mental Status:  AA&Ox3, patient is able to give clear and coherent history Speech/Language: speech is without dysarthria or aphasia.  Naming, repetition, fluency, and comprehension intact.  Cranial Nerves:  II: PERRL. Visual fields full.  III, IV, VI: EOMI. Eyelids elevate symmetrically.  V: Sensation is intact to light touch and symmetrical to face.  VII: Face is symmetrical resting and smiling VIII: hearing intact to voice. IX, X: Palate elevates symmetrically. Phonation is normal.  KP:Dynloizm shrug 5/5. XII: tongue is midline without fasciculations. Motor: 5/5 strength to all muscle groups tested.  Tone: is normal and bulk is normal Sensation- Intact to light touch bilaterally. Extinction absent to light touch to DSS.   Coordination: FTN intact bilaterally, HKS: no ataxia in BLE.No drift.  Gait- deferred  Most Recent NIH 0   ASSESSMENT/PLAN  Brent Ray. is a 78 y.o. male with history of A-fib on Eliquis , Eliquis  recently stopped due to rectal bleeding s/p PPM, CHF, CKD, CAD, cardiomyopathy, history of CVA hypertension, GERD, hyperlipidemia, rheumatoid arthritis, sleep apnea, presented with left-sided weakness and slurred speech NIH on Admission 14  Acute Ischemic Infarct:  right MCA s/p mechanical thrombectomy of right M2 occlusion with complete revascularization TICI 3 Etiology: Cardioembolic in the setting  of A-fib off of his anticoagulation due to recent GI bleed Code Stroke  CT head No acute abnormality. ASPECTS 10.    CTA head & neck 1. Occlusion of a right MCA distal M2 branch in the posterior sylvian fissure. 2. Multifocal narrowing of the left PCA P2 segment with maintained patency. 3. Atherosclerotic calcification at the right carotid bifurcation extending into the proximal internal and external carotid arteries without hemodynamically significant stenosis. 4. Left carotid bifurcation atherosclerosis without hemodynamically significant stenosis. MRI ordered 2D Echo 01/03/2024  EF 55%.  Left atrium and right atrium severely dilated.  Moderate to severe mitral valve regurgitation.  Mild prolapse of posterior leaflet of mitral valve LDL 54 HgbA1c 5.6 VTE prophylaxis -SCDs No antithrombotic prior to admission, now on aspirin  325 mg daily.  Will need to talk with cardiology and GI in terms of restarting anticoagulation Therapy recommendations:  Pending Disposition: Pending  Atrial fibrillation Home Meds: Eliquis  however been on hold since 01/06/2024 in setting of recent GI bleed Continue telemetry monitoring Will need to consider restarting DOAC or IV heparin , after discussion with GI and cardiology  Hypertension CHF CAD Cardiomyopathy Home meds: Lasix  20 mg, losartan  12.5 mg Stable Blood Pressure Goal: SBP 120-160 for first 24 hours then less than 180   Hyperlipidemia Home meds: None, resumed in hospital LDL 54, goal < 70 High intensity statin not indicated due to below goal Continue statin at discharge  Dysphagia Patient has post-stroke dysphagia, SLP consulted    Diet   Diet regular Room service appropriate? Yes; Fluid consistency: Thin   Advance diet as tolerated  Other Stroke Risk Factors Coronary artery disease Congestive heart failure Obstructive sleep apnea, on CPAP at home   Other Active Problems CKD-creatinine 1.85  Hospital day # 1    Karna Geralds DNP, ACNPC-AG  [facility]  I have personally obtained history,examined this patient, reviewed notes, independently viewed imaging studies, participated in medical decision making and plan of care.ROS completed by me personally and pertinent positives fully documented  I have made any additions or clarifications directly to the above note. Agree with note above.  Patient presented with sudden onset of left hemiparesis due to right M2 occlusion and did not receive TNK due to recent GI hemorrhage 10 days ago but underwent successful mechanical thrombectomy with excellent clinical  outcome.  CT head showed no hemorrhage or large stroke.  Plan to start IV heparin .  May need to consider switching to alternative NOAC at discharge if patient can afford it.  Long discussion with patient wife at the bedside regarding risk benefits and alternatives to anticoagulation.  Patient wants to undergo Watchman device but he will still need short-term anticoagulation.  Will consult heart failure team for his cardiac management as he is the patient.  Close neurological monitoring and strict blood pressure control as per post intervention protocol. This patient is critically ill and at significant risk of neurological worsening, death and care requires constant monitoring of vital signs, hemodynamics,respiratory and cardiac monitoring, extensive review of multiple databases, frequent neurological assessment, discussion with family, other specialists and medical decision making of high complexity.I have made any additions or clarifications directly to the above note.This critical care time does not reflect procedure time, or teaching time or supervisory time of PA/NP/Med Resident etc but could involve care discussion time.  I spent 40 minutes of neurocritical care time  in the care of  this patient.      Eather Popp, MD Medical Director [facility]  Pager: [phone] 01/13/2024 1:48 PM   To contact Stroke Continuity provider, please refer to [email or web address]. After hours, contact General Neurology

## 2024-01-13 NOTE — Progress Notes (Signed)
 PHARMACY - ANTICOAGULATION CONSULT NOTE  Pharmacy Consult for heparin  Indication: atrial fibrillation and stroke  Allergies  Allergen Reactions   Amiodarone Other (See Comments)    Severe joint pain and blindness   Flecainide  Other (See Comments)    Severe headaches, blurred vision   Tamsulosin Hives and Itching   Tamsulosin Hcl Hives, Itching, Dermatitis and Rash   Carvedilol  Other (See Comments)    Unknown   Dapagliflozin Diarrhea   Eplerenone Other (See Comments)    Unknown   Lisinopril  Nausea And Vomiting and Nausea Only   Metoprolol  Other (See Comments)    Headaches, dizziness   Sacubitril-Valsartan Nausea And Vomiting and Nausea Only   Spironolactone  Swelling and Other (See Comments)    Breast swelling, headaches   Statins Other (See Comments)    Leg cramps   Sulfasalazine Nausea Only    Patient Measurements: Height: 5' 8 (172.7 cm) Weight: 54.3 kg (119 lb 12.8 oz) IBW/kg (Calculated) : 68.4 HEPARIN  DW (KG): 54.3  Vital Signs: Temp: 97.8 F (36.6 C) (11/02 1100) Temp Source: Axillary (11/02 1100) BP: 136/84 (11/02 1200) Pulse Rate: 75 (11/02 1200)  Labs: Recent Labs    01/12/24 1848 01/12/24 1850  HGB 12.6* 12.9*  HCT 37.9* 38.0*  PLT 520*  --   APTT 29  --   LABPROT 15.3*  --   INR 1.1  --   CREATININE 1.85* 2.00*    Estimated Creatinine Clearance: 24.1 mL/min (A) (by C-G formula based on SCr of 2 mg/dL (H)).   Medical History: Past Medical History:  Diagnosis Date   A-fib [facility])    AICD (automatic cardioverter/defibrillator) present    Barrett's esophagus 2011   In [place] . Records in Epic   CHF (congestive heart failure) (HCC)    Chronic kidney disease    Coronary atherosclerosis of native coronary artery    Nonobstructive   Essential hypertension, benign    GERD (gastroesophageal reflux disease)    Heart block AV first degree    Chronic   History of kidney stones    Hypercholesterolemia    Mixed hyperlipidemia    NSVT  (nonsustained ventricular tachycardia) (HCC)    Holter monitor, 2011; recommended RF ablation by Dr. Kelsie - never required   PAF (paroxysmal atrial fibrillation) (HCC)    Recently documented in [facility] June 2016   Pre-diabetes    Rheumatoid arthritis (HCC)    Sinus bradycardia    Symptomatic, acebutolol discontinued   Skin cancer    Sleep apnea    pt denies    Medications:  Medications Prior to Admission  Medication Sig Dispense Refill Last Dose/Taking   acetaminophen  (TYLENOL ) 325 MG tablet Take 650 mg by mouth as needed for mild pain (pain score 1-3).      [Paused] apixaban  (ELIQUIS ) 2.5 MG TABS tablet Take 1 tablet (2.5 mg total) by mouth 2 (two) times daily. 60 tablet 0    chlorhexidine  (PERIDEX ) 0.12 % solution Use as directed 5 mLs in the mouth or throat 2 (two) times daily.      CRANBERRY PO Take 500 mg by mouth daily.      ferrous sulfate 325 (65 FE) MG tablet Take 1 tablet (325 mg total) by mouth 2 (two) times daily with a meal. (Patient taking differently: Take 325 mg by mouth every other day.) 180 tablet 0    folic acid  (FOLVITE ) 1 MG tablet Take 1 mg by mouth daily.  NOT on Friday d/t methotrexate       furosemide  (LASIX )  20 MG tablet Take 1 tablet (20 mg total) by mouth 2 (two) times daily. 180 tablet 3    hydrocortisone  (ANUSOL -HC) 2.5 % rectal cream Place 1 Application rectally 2 (two) times daily. (Patient not taking: Reported on 01/08/2024) 30 g 0    ipratropium (ATROVENT) 0.06 % nasal spray Place 2 sprays into both nostrils daily as needed for rhinitis.      losartan  (COZAAR ) 25 MG tablet Take 0.5 tablets (12.5 mg total) by mouth at bedtime. 45 tablet 3    methotrexate  (RHEUMATREX) 2.5 MG tablet Take 2.5 mg by mouth every Friday. 6 pills weekly      mexiletine (MEXITIL ) 150 MG capsule Take 150 mg by mouth 2 (two) times daily.      NITROSTAT  0.4 MG SL tablet DISSOLVE 1 TABLET UNDER TONGUE EVERY 5 MINUTES AS NEEDED FOR CHEST PAIN (Patient taking differently: Place 0.4 mg  under the tongue every 5 (five) minutes as needed for chest pain.) 25 tablet 3    ondansetron  (ZOFRAN ) 4 MG tablet Take 4 mg by mouth every 8 (eight) hours as needed for nausea.      pantoprazole  (PROTONIX ) 40 MG tablet Take 1 tablet (40 mg total) by mouth 2 (two) times daily before a meal. (Patient taking differently: Take 40 mg by mouth as needed.) 120 tablet 2    potassium chloride  SA (KLOR-CON  M) 20 MEQ tablet Take 1 tablet (20 mEq total) by mouth 2 (two) times daily. 180 tablet 3    Probiotic Product (PROBIOTIC DAILY PO) Take 110 mg by mouth daily.      rosuvastatin  (CRESTOR ) 5 MG tablet Take 5 mg by mouth every Sunday. (Patient not taking: Reported on 01/08/2024)      senna-docusate (SENOKOT-S) 8.6-50 MG tablet Take 2 tablets by mouth at bedtime. 180 tablet 0    sodium bicarbonate  650 MG tablet Take 650 mg by mouth daily.      sucralfate (CARAFATE) 1 GM/10ML suspension Take 10 mLs (1 g total) by mouth 3 (three) times daily for 5 days. 150 mL 0    traMADol (ULTRAM) 50 MG tablet Take 50 mg by mouth every 6 (six) hours as needed for moderate pain (pain score 4-6).       Assessment: 85 yom  who presented to [facility] with left sided weakness and slurred speech. Code stroke was called and he was found to have right M2 occlusion. He was transferred to [facility] and is s/p right M2 thrombectomy on 11/1. He has a hx Afib/CVA and was on apixaban  but this has been on hold since recent admit 10/22-10/26 for GIB. Confirmed with patient last dose was 10/22.   No overt bleeding noted, Hgb 12s and platelets are elevated.  Goal of Therapy:  Heparin  level 0.3-0.5 units/ml Monitor platelets by anticoagulation protocol: Yes   Plan:  Begin heparin  drip at 650 units/hr with no bolus 8h heparin  level, aPTT to ensure has cleared apixaban  Daily heparin  level, CBC Monitor for s/sx of bleeding  Thank you for involving pharmacy in this patient's care.  Delon Sax, PharmD, BCPS Clinical Pharmacist Clinical phone  for 01/13/2024 is [phone] 01/13/2024 2:33 PM

## 2024-01-13 NOTE — Progress Notes (Signed)
 Okay to switch NIH assessments from Q 1 hr to Q 4 hrs at 0001 on 01/14/24 per Rosemarie, MD.

## 2024-01-14 ENCOUNTER — Encounter (HOSPITAL_COMMUNITY): Payer: Self-pay | Admitting: Radiology

## 2024-01-14 ENCOUNTER — Inpatient Hospital Stay (HOSPITAL_COMMUNITY)

## 2024-01-14 ENCOUNTER — Other Ambulatory Visit: Payer: Self-pay | Admitting: Internal Medicine

## 2024-01-14 DIAGNOSIS — N1832 Chronic kidney disease, stage 3b: Secondary | ICD-10-CM | POA: Diagnosis not present

## 2024-01-14 DIAGNOSIS — I5022 Chronic systolic (congestive) heart failure: Secondary | ICD-10-CM | POA: Diagnosis not present

## 2024-01-14 DIAGNOSIS — I63411 Cerebral infarction due to embolism of right middle cerebral artery: Secondary | ICD-10-CM | POA: Diagnosis not present

## 2024-01-14 DIAGNOSIS — R29700 NIHSS score 0: Secondary | ICD-10-CM | POA: Diagnosis not present

## 2024-01-14 DIAGNOSIS — I639 Cerebral infarction, unspecified: Secondary | ICD-10-CM

## 2024-01-14 DIAGNOSIS — I4821 Permanent atrial fibrillation: Secondary | ICD-10-CM | POA: Diagnosis not present

## 2024-01-14 DIAGNOSIS — I7090 Unspecified atherosclerosis: Secondary | ICD-10-CM | POA: Diagnosis not present

## 2024-01-14 LAB — CBC
HCT: 31.1 % — ABNORMAL LOW (ref 39.0–52.0)
Hemoglobin: 10.1 g/dL — ABNORMAL LOW (ref 13.0–17.0)
MCH: 32.4 pg (ref 26.0–34.0)
MCHC: 32.5 g/dL (ref 30.0–36.0)
MCV: 99.7 fL (ref 80.0–100.0)
Platelets: 478 K/uL — ABNORMAL HIGH (ref 150–400)
RBC: 3.12 MIL/uL — ABNORMAL LOW (ref 4.22–5.81)
RDW: 17.2 % — ABNORMAL HIGH (ref 11.5–15.5)
WBC: 4 K/uL (ref 4.0–10.5)
nRBC: 0 % (ref 0.0–0.2)

## 2024-01-14 LAB — BASIC METABOLIC PANEL WITH GFR
Anion gap: 9 (ref 5–15)
BUN: 21 mg/dL (ref 8–23)
CO2: 18 mmol/L — ABNORMAL LOW (ref 22–32)
Calcium: 8.6 mg/dL — ABNORMAL LOW (ref 8.9–10.3)
Chloride: 112 mmol/L — ABNORMAL HIGH (ref 98–111)
Creatinine, Ser: 1.65 mg/dL — ABNORMAL HIGH (ref 0.61–1.24)
GFR, Estimated: 43 mL/min — ABNORMAL LOW (ref >60)
Glucose, Bld: 106 mg/dL — ABNORMAL HIGH (ref 70–99)
Potassium: 4.9 mmol/L (ref 3.5–5.1)
Sodium: 139 mmol/L (ref 135–145)

## 2024-01-14 LAB — HEPARIN LEVEL (UNFRACTIONATED)
Heparin Unfractionated: 0.29 [IU]/mL — ABNORMAL LOW (ref 0.30–0.70)
Heparin Unfractionated: 0.39 [IU]/mL (ref 0.30–0.70)

## 2024-01-14 MED ORDER — LOSARTAN POTASSIUM 25 MG PO TABS
12.5000 mg | ORAL_TABLET | Freq: Every day | ORAL | Status: DC
  Administered 2024-01-14: 12.5 mg via ORAL
  Filled 2024-01-14: qty 0.5

## 2024-01-14 MED ORDER — PANTOPRAZOLE SODIUM 40 MG PO TBEC
40.0000 mg | DELAYED_RELEASE_TABLET | Freq: Two times a day (BID) | ORAL | Status: DC
  Administered 2024-01-14 – 2024-01-15 (×2): 40 mg via ORAL
  Filled 2024-01-14 (×2): qty 1

## 2024-01-14 MED ORDER — APIXABAN 2.5 MG PO TABS
2.5000 mg | ORAL_TABLET | Freq: Two times a day (BID) | ORAL | Status: DC
Start: 2024-01-14 — End: 2024-01-15
  Administered 2024-01-14 – 2024-01-15 (×2): 2.5 mg via ORAL
  Filled 2024-01-14 (×2): qty 1

## 2024-01-14 MED ORDER — DOCUSATE SODIUM 100 MG PO CAPS
100.0000 mg | ORAL_CAPSULE | Freq: Every day | ORAL | Status: DC
  Administered 2024-01-14 – 2024-01-15 (×2): 100 mg via ORAL
  Filled 2024-01-14 (×2): qty 1

## 2024-01-14 MED ORDER — POLYETHYLENE GLYCOL 3350 17 G PO PACK
17.0000 g | PACK | Freq: Every day | ORAL | Status: DC
  Administered 2024-01-14 – 2024-01-15 (×2): 17 g via ORAL
  Filled 2024-01-14 (×2): qty 1

## 2024-01-14 NOTE — Progress Notes (Signed)
 PHARMACY - ANTICOAGULATION CONSULT NOTE  Pharmacy Consult for heparin  Indication: atrial fibrillation and stroke  Allergies  Allergen Reactions   Flecainide  Other (See Comments)    Severe headaches, blurred vision   Flomax [Tamsulosin] Hives, Itching, Dermatitis and Rash   Pacerone [Amiodarone] Other (See Comments)    Blindness/vision changes Arthralgias    Aldactone  [Spironolactone ] Swelling and Other (See Comments)    Breast swelling Headaches   Coreg  [Carvedilol ] Other (See Comments)    Unknown reaction   Entresto [Sacubitril-Valsartan] Nausea And Vomiting   Farxiga [Dapagliflozin] Diarrhea   Inspra [Eplerenone] Other (See Comments)    Unknown reaction   Lopressor  [Metoprolol ] Other (See Comments)    Headaches Dizziness    Zestril  [Lisinopril ] Nausea And Vomiting   Ferosul [Ferrous Sulfate] Other (See Comments)    Bleeding hemorrhoids secondary to severe constipation    Azulfidine [Sulfasalazine] Nausea Only   Statins Other (See Comments)    Myalgias     Patient Measurements: Height: 5' 8 (172.7 cm) Weight: 54.3 kg (119 lb 12.8 oz) IBW/kg (Calculated) : 68.4 HEPARIN  DW (KG): 54.3  Vital Signs: Temp: 97.3 F (36.3 C) (11/03 1200) Temp Source: Oral (11/03 1200) BP: 135/91 (11/03 1300) Pulse Rate: 69 (11/03 1300)  Labs: Recent Labs    01/12/24 1848 01/12/24 1850 01/13/24 2235 01/14/24 0549 01/14/24 1203  HGB 12.6* 12.9*  --  10.1*  --   HCT 37.9* 38.0*  --  31.1*  --   PLT 520*  --   --  478*  --   APTT 29  --  54*  --   --   LABPROT 15.3*  --   --   --   --   INR 1.1  --   --   --   --   HEPARINUNFRC  --   --  0.17* 0.29* 0.39  CREATININE 1.85* 2.00*  --  1.65*  --     Estimated Creatinine Clearance: 29.3 mL/min (A) (by C-G formula based on SCr of 1.65 mg/dL (H)).   Medical History: Past Medical History:  Diagnosis Date   A-fib [facility])    AICD (automatic cardioverter/defibrillator) present    Barrett's esophagus 2011   In [place] . Records in  Epic   CHF (congestive heart failure) (HCC)    Chronic kidney disease    Coronary atherosclerosis of native coronary artery    Nonobstructive   Essential hypertension, benign    GERD (gastroesophageal reflux disease)    Heart block AV first degree    Chronic   History of kidney stones    Hypercholesterolemia    Mixed hyperlipidemia    NSVT (nonsustained ventricular tachycardia) (HCC)    Holter monitor, 2011; recommended RF ablation by Dr. Kelsie - never required   PAF (paroxysmal atrial fibrillation) (HCC)    Recently documented in [facility] June 2016   Pre-diabetes    Rheumatoid arthritis (HCC)    Sinus bradycardia    Symptomatic, acebutolol discontinued   Skin cancer    Sleep apnea    pt denies    Medications:  Medications Prior to Admission  Medication Sig Dispense Refill Last Dose/Taking   acetaminophen  (TYLENOL ) 500 MG tablet Take 1,000 mg by mouth 2 (two) times daily as needed for fever or headache (pain).   Past Month   bisacodyl (DULCOLAX) 10 MG suppository Place 10 mg rectally daily as needed for severe constipation.   Past Week   folic acid  (FOLVITE ) 1 MG tablet Take 1 mg by mouth See admin instructions.  Take 1 tablet (1mg ) by mouth daily, 6 days a week. Do not take folic acid  on Friday.   Past Week   furosemide  (LASIX ) 20 MG tablet Take 1 tablet (20 mg total) by mouth 2 (two) times daily. (Patient taking differently: Take 20 mg by mouth daily.) 180 tablet 3 Past Week   hydrocortisone  (ANUSOL -HC) 2.5 % rectal cream Place 1 Application rectally 2 (two) times daily. 30 g 0 Past Week   losartan  (COZAAR ) 25 MG tablet Take 0.5 tablets (12.5 mg total) by mouth at bedtime. (Patient taking differently: Take 12.5 mg by mouth daily after supper.) 45 tablet 3 Past Week   methotrexate  (RHEUMATREX) 2.5 MG tablet Take 30 mg by mouth every Friday.   Past Week   mexiletine (MEXITIL ) 150 MG capsule Take 150 mg by mouth 2 (two) times daily.   Past Week   NITROSTAT  0.4 MG SL tablet DISSOLVE  1 TABLET UNDER TONGUE EVERY 5 MINUTES AS NEEDED FOR CHEST PAIN (Patient taking differently: Place 0.4 mg under the tongue every 5 (five) minutes as needed for chest pain.) 25 tablet 3 Past Month   NONFORMULARY OR COMPOUNDED ITEM Use as directed 5 mLs in the mouth or throat 4 (four) times daily as needed (mouth pain). Pharmacy compounded Magic Mouthwash:  unknown quantities of nystatin/hydrocortisone /lidocaine /diphenhydramine  Swish and spit 5mL up to 4 times daily as needed for mouth pain.   Past Week   pantoprazole  (PROTONIX ) 40 MG tablet Take 1 tablet (40 mg total) by mouth 2 (two) times daily before a meal. (Patient taking differently: Take 40 mg by mouth daily as needed (heartburn, acid reflux).) 120 tablet 2 Unknown   potassium chloride  SA (KLOR-CON  M) 20 MEQ tablet Take 1 tablet (20 mEq total) by mouth 2 (two) times daily. (Patient taking differently: Take 20 mEq by mouth daily.) 180 tablet 3 Past Week   senna-docusate (SENOKOT-S) 8.6-50 MG tablet Take 2 tablets by mouth at bedtime. (Patient taking differently: Take 2 tablets by mouth at bedtime as needed (constipation).) 180 tablet 0 Past Week   sodium bicarbonate  650 MG tablet Take 650 mg by mouth daily.   Past Week   traMADol (ULTRAM) 50 MG tablet Take 50 mg by mouth every 6 (six) hours as needed for moderate pain (pain score 4-6).   Past Month   [Paused] apixaban  (ELIQUIS ) 2.5 MG TABS tablet Take 1 tablet (2.5 mg total) by mouth 2 (two) times daily. (Patient not taking: Reported on 01/14/2024) 60 tablet 0 Not Taking   sucralfate (CARAFATE) 1 GM/10ML suspension Take 10 mLs (1 g total) by mouth 3 (three) times daily for 5 days. (Patient not taking: Reported on 01/14/2024) 150 mL 0 Not Taking    Assessment: Brent Ray  who presented to [facility] with left sided weakness and slurred speech. Code stroke was called and he was found to have right M2 occlusion. He was transferred to [facility] and is s/p right M2 thrombectomy on 11/1. He has a hx Afib/CVA and was on  apixaban  but this has been on hold since recent admit 10/22-10/26 for GIB. Confirmed with patient last dose was 10/22.   No bleeding noted, heparin  level this afternoon therapeutic at 0.39. Level collected this morning resulted at 0.29 however was drawn ~ 3 hours early. HgB 10.1 and PLTs 478.   Goal of Therapy:  Heparin  level 0.3-0.5 units/ml Monitor platelets by anticoagulation protocol: Yes   Plan:  Continue heparin  to 750 units/hr Heparin  level in 8 hours Monitor for s/sx of bleeding F/u  ability to transition back to DOAC, likely expecting 2.5mg  BID of Eliquis , cardiology being consulted for Watchmann eval.    ADDENDUM 1420 Transitioning IV heparin  to apixaban  later tonight. At 2200, stop heparin  drip and give apixaban  2.5mg  BID.  Ozell Jamaica, PharmD, BCPS, [facility] Clinical Pharmacist [phone] Please check AMION for all [facility] Pharmacy numbers 01/14/2024

## 2024-01-14 NOTE — TOC CAGE-AID Note (Signed)
 Transition of Care [facility]) - CAGE-AID Screening   Patient Details  Name: Brent Ray. MRN: [id number] Date of Birth: 1945-08-28  Transition of Care [facility]) CM/SW Contact:    Arlana JINNY Nicholaus [place] Phone Number: [phone] 01/14/2024, 4:21 PM   Clinical Narrative:  Patient reported that he does not use any substances. Denies need for any resources.    CAGE-AID Screening:    Have You Ever Felt You Ought to Cut Down on Your Drinking or Drug Use?: No Have People Annoyed You By Critizing Your Drinking Or Drug Use?: No Have You Felt Bad Or Guilty About Your Drinking Or Drug Use?: No Have You Ever Had a Drink or Used Drugs First Thing In The Morning to Steady Your Nerves or to Get Rid of a Hangover?: No CAGE-AID Score: 0  Substance Abuse Education Offered: No  Substance abuse interventions: SDOH Screening

## 2024-01-14 NOTE — Progress Notes (Signed)
 Physical Therapy Treatment Patient Details Name: Brent Ray. MRN: [id number] DOB: Aug 17, 1945 Today's Date: 01/14/2024   History of Present Illness Pt is a 78 y.o. male who presented 01/12/24 with L-sided weakness and slurred speech. Not a candidate for TNK. 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 S/p successful thrombectomy 11/1. Head CT negative. PMH: CHF, a-fib, AICD, Barrett's esophagus, CKD, HTN, GERD, HLD, NSVT, RA, skin cancer, sleep apnea, heart block AV first degree    PT Comments  Patient reported just walked in hallways on PT arrival. Reports he intermittently used RW as walking with OT. He agrees he should use RW initially at home. Offered stair training and pt did not want to perform but open to therapist demonstrating for him and his wife. After demonstration (using folded floor mat to simulate threshold into home), pt and wife able to verbalize correct sequencing with weaker vs stronger leg. Patient initially interested in updating discharge plan to OPPT (which he has been doing since August), however wife recalled how his last visit he felt poorly after session and he felt he was pushed too hard. Discussed using HHPT initially until he feels strong enough to go to OPPT and pt/wife appreciative of this plan. No further questions at this time.    If plan is discharge home, recommend the following: A little help with walking and/or transfers;A little help with bathing/dressing/bathroom;Assistance with cooking/housework;Assist for transportation;Help with stairs or ramp for entrance   Can travel by private vehicle        Equipment Recommendations  None recommended by PT    Recommendations for Other Services       Precautions / Restrictions Precautions Precautions: Fall Precaution/Restrictions Comments: SBP < 160 Restrictions Weight Bearing Restrictions Per Provider Order: No     Mobility  Bed Mobility               General bed mobility comments: OOB on arrival     Transfers                        Ambulation/Gait               General Gait Details: pt refused stating he had just walked in hallway with OT   Stairs Stairs: Yes       General stair comments: pt reports has not used RW on entry step before; PT demonstrated up/down single step with RW with emphasis on strong leg up, and weak leg leads down. Pt able to verbalize correct pattern after PT demonstration. Wife present and agrees they will not have a problem as it is just a small threshold he has to step up.   Wheelchair Mobility     Tilt Bed    Modified Rankin (Stroke Patients Only) Modified Rankin (Stroke Patients Only) Pre-Morbid Rankin Score: No symptoms Modified Rankin: Moderately severe disability     Balance                                            Communication Communication Communication: No apparent difficulties  Cognition Arousal: Alert Behavior During Therapy: WFL for tasks assessed/performed                             Following commands: Intact      Cueing Cueing Techniques: Verbal cues, Tactile cues  Exercises      General Comments General comments (skin integrity, edema, etc.): VSS, spouse present and supportive      Pertinent Vitals/Pain Pain Assessment Pain Assessment: No/denies pain    Home Living Family/patient expects to be discharged to:: Private residence Living Arrangements: Spouse/significant other;Children Available Help at Discharge: Family;Available 24 hours/day Type of Home: House Home Access: Stairs to enter Entrance Stairs-Rails: None Entrance Stairs-Number of Steps: 1 Alternate Level Stairs-Number of Steps: 13 Home Layout: Laundry or work area in basement;One level;Able to live on main level with bedroom/bathroom Home Equipment: Rolling Walker (2 wheels);Cane - single point;BSC/3in1;Wheelchair Writer (comment);Shower seat - built in;Grab bars - tub/shower;Grab bars -  toilet      Prior Function            PT Goals (current goals can now be found in the care plan section) Acute Rehab PT Goals Patient Stated Goal: to get back to his baseline; return to OPPT PT Goal Formulation: With patient/family Time For Goal Achievement: 01/27/24 Potential to Achieve Goals: Good Progress towards PT goals: Progressing toward goals    Frequency    Min 2X/week      PT Plan      Co-evaluation              AM-PAC PT 6 Clicks Mobility   Outcome Measure  Help needed turning from your back to your side while in a flat bed without using bedrails?: A Little Help needed moving from lying on your back to sitting on the side of a flat bed without using bedrails?: A Little Help needed moving to and from a bed to a chair (including a wheelchair)?: A Little Help needed standing up from a chair using your arms (e.g., wheelchair or bedside chair)?: A Little Help needed to walk in hospital room?: A Little Help needed climbing 3-5 steps with a railing? : A Little 6 Click Score: 18    End of Session   Activity Tolerance: Patient tolerated treatment well Patient left: in chair;with call bell/phone within reach;with chair alarm set;with family/visitor present   PT Visit Diagnosis: Unsteadiness on feet (R26.81);Other abnormalities of gait and mobility (R26.89);Muscle weakness (generalized) (M62.81);Difficulty in walking, not elsewhere classified (R26.2)     Time: 8468-8457 PT Time Calculation (min) (ACUTE ONLY): 11 min  Charges:    $Self Care/Home Management: 8-22 PT General Charges $$ ACUTE PT VISIT: 1 Visit                      Macario RAMAN, PT Acute Rehabilitation Services  Office [phone]    Macario SHAUNNA Soja 01/14/2024, 3:52 PM

## 2024-01-14 NOTE — Progress Notes (Signed)
 PT Cancellation Note  Patient Details Name: Brent Ray. MRN: [id number] DOB: 1945/08/11   Cancelled Treatment:    Reason Eval/Treat Not Completed: Patient at procedure or test/unavailable  Patient eating lunch and requested therapy return later.    Macario RAMAN, PT Acute Rehabilitation Services  Office [phone]  Macario SHAUNNA Soja 01/14/2024, 2:22 PM

## 2024-01-14 NOTE — Evaluation (Signed)
 Occupational Therapy Evaluation Patient Details Name: Brent Ray Reasons. MRN: [id number] DOB: 07/07/1945 Today's Date: 01/14/2024   History of Present Illness   Pt is a 78 y.o. male who presented 01/12/24 with L-sided weakness and slurred speech. Not a candidate for TNK. 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 S/p successful thrombectomy 11/1. Head CT negative. PMH: CHF, a-fib, AICD, Barrett's esophagus, CKD, HTN, GERD, HLD, NSVT, RA, skin cancer, sleep apnea, heart block AV first degree     Clinical Impressions Brent Ray was evaluated s/p the above admission list. He is indep at baseline. Upon evaluation, pt demonstrated mod I ability to complete mobility and ADLs. Generalized superivsion A provided throughout for safety only, pt verbalized understanding to use RW upon discharge to increase safety. Pt does not require further acute, or follow up OT services. Recommend discharge back to pt's environment with assist as needed. OT to sign off with appreciation of order, please re-consult if needed.       If plan is discharge home, recommend the following:   Assist for transportation;Assistance with cooking/housework     Functional Status Assessment   Patient has had a recent decline in their functional status and demonstrates the ability to make significant improvements in function in a reasonable and predictable amount of time.     Equipment Recommendations   None recommended by OT      Precautions/Restrictions   Precautions Precautions: Fall Precaution/Restrictions Comments: SBP < 160 Restrictions Weight Bearing Restrictions Per Provider Order: No     Mobility Bed Mobility               General bed mobility comments: OOB on arrival    Transfers Overall transfer level: Needs assistance Equipment used: Rolling walker (2 wheels) Transfers: Sit to/from Stand Sit to Stand: Modified independent (Device/Increase time)           General transfer comment: generalized  supervision provided but pt demonstrated mod I ability with RW. He is agreeable to use RW at discharge      Balance Overall balance assessment: Mild deficits observed, not formally tested             ADL either performed or assessed with clinical judgement   ADL Overall ADL's : At baseline                 General ADL Comments: mod I with RW, pt with mildly slowed gait. he reports he is just being cautious.     Vision Baseline Vision/History: 0 No visual deficits Vision Assessment?: No apparent visual deficits     Perception Perception: Within Functional Limits       Praxis Praxis: WFL       Pertinent Vitals/Pain Pain Assessment Pain Assessment: No/denies pain     Extremity/Trunk Assessment Upper Extremity Assessment Upper Extremity Assessment: Overall WFL for tasks assessed   Lower Extremity Assessment Lower Extremity Assessment: Defer to PT evaluation   Cervical / Trunk Assessment Cervical / Trunk Assessment: Normal   Communication Communication Communication: No apparent difficulties   Cognition Arousal: Alert Behavior During Therapy: WFL for tasks assessed/performed Cognition: No apparent impairments                               Following commands: Intact       Cueing  General Comments   Cueing Techniques: Verbal cues;Tactile cues  VSS, spouse present and supportive    Home Living Family/patient expects to be discharged  to:: Private residence Living Arrangements: Spouse/significant other;Children Available Help at Discharge: Family;Available 24 hours/day Type of Home: House Home Access: Stairs to enter [employer] of Steps: 1 Entrance Stairs-Rails: None Home Layout: Laundry or work area in basement;One level;Able to live on main level with bedroom/bathroom Alternate Level Stairs-Number of Steps: 13 Alternate Level Stairs-Rails: Can reach both Bathroom Shower/Tub: Walk-in shower;Tub/shower unit   Bathroom  Toilet: Handicapped height Bathroom Accessibility: Yes   Home Equipment: Agricultural Consultant (2 wheels);Cane - single point;BSC/3in1;Wheelchair Writer (comment);Shower seat - built in;Grab bars - tub/shower;Grab bars - toilet          Prior Functioning/Environment Prior Level of Function : Independent/Modified Independent             Mobility Comments: No AD ADLs Comments: 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, 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    PHARMACIST, COMMUNITY Problem List: Decreased activity tolerance        OT Goals(Current goals can be found in the care plan section)   Acute Rehab OT Goals Patient Stated Goal: home tomorrow OT Goal Formulation: With patient Time For Goal Achievement: 01/28/24 Potential to Achieve Goals: Good   AM-PAC OT 6 Clicks Daily Activity     Outcome Measure Help from another person eating meals?: None Help from another person taking care of personal grooming?: None Help from another person toileting, which includes using toliet, bedpan, or urinal?: A Little Help from another person bathing (including washing, rinsing, drying)?: None Help from another person to put on and taking off regular upper body clothing?: None Help from another person to put on and taking off regular lower body clothing?: None 6 Click Score: 23   End of Session Equipment Utilized During Treatment: Rolling walker (2 wheels) Nurse Communication: Mobility status  Activity Tolerance: Patient tolerated treatment well Patient left: in chair;with call bell/phone within reach;with chair alarm set;with family/visitor present  OT Visit Diagnosis: Other abnormalities of gait and mobility (R26.89)                Time: 8553-8494 OT Time Calculation (min): 19 min Charges:  OT General Charges $OT Visit: 1 Visit OT Evaluation $OT Eval Low Complexity: 1 Low  Lucie Kendall, OTR/L Acute Rehabilitation Services Office [phone] Secure Chat Communication Preferred   Lucie JONETTA Kendall 01/14/2024, 3:33 PM

## 2024-01-14 NOTE — Consult Note (Addendum)
 Advanced Heart Failure Team Consult Note  Primary Physician: Meade Bigness, MD Cardiologist:  Diannah SHAUNNA Maywood, MD HF Cardiologist: Dr. Zenaida  Reason for Consultation: HF Medication Management HPI:    Brent Ray. is seen today for evaluation of HF medication management at the request of Stroke Team, MD.   Brent Ray. is a 78 y.o. 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     Unfortunately he reports intolerance to almost every class of GDMT available. During previous HF visit he agreed to start trying losartan  due to a rapid decline in functional status. Unfortunately on the way home from clinic he had a stroke leading to a week long hospitalization (left ICA occlusion).    Admitted 10/25 with CP. HsTroponin flat. CTA chest showed small pericardial effusion. Echo showed EF 55%, RV normal, moderate to severe MR. Cards consulted, planned for [facility] but ultimately cancelled due to rapidly declining hgb, down to 7. Felt CP 2/2 to anemia. AC held and patient underwent upper GI endoscopy showing markedly raw friable gastric mucosa, no gastric ulcer but he did have gum ulcers. Received PRBCs & IV iron. Remained off AC until GI follow up and discharged home, weight 113 lbs.   Seen for post-hospital follow up 10/28, had been doing well from a heart failure perspective. Had been off of anticoagulation, was not interested in restarting. Had been seen in ED for hemorrhoidal bleeding on 01/07/24   He presented to [facility]/12/24 with left-sided weakness. He was found to have R M2 occlusion and taken for emergent thrombectomy. Felt to be caused by embolus from atrial fibrillation. He  was started on heparin  and aspirin . CT head 11/2 with mod chronic small vessel disease. MRI brain this morning showed small right insular infarct. Hgb notably 12.9>10.1.   Today, sitting up in the chair. Family at bedside. No SOB. Remains with weakness in L side although improving.   Home Medications Prior to Admission medications   Medication Sig Start Date End Date Taking? Authorizing Provider  acetaminophen  (TYLENOL ) 325 MG tablet Take 650 mg by mouth as needed for mild pain (pain score 1-3).    [provider]  apixaban  (ELIQUIS ) 2.5 MG TABS tablet Take 1 tablet (2.5 mg total) by mouth 2 (two) times daily. 08/29/23   Odell Celinda Balo, MD  chlorhexidine  (PERIDEX ) 0.12 % solution Use as directed 5 mLs in the mouth or throat 2 (two) times daily. 12/03/23   [provider]  CRANBERRY PO Take 500 mg by mouth daily.    [provider]  ferrous sulfate 325 (65 FE) MG tablet Take 1 tablet (325 mg total) by mouth 2 (two) times daily with a meal. Patient taking differently: Take 325 mg by mouth every other day. 01/06/24 04/05/24  Pokhrel, Vernal, MD  folic acid  (FOLVITE ) 1 MG tablet Take 1 mg by mouth daily.  NOT on Friday d/t methotrexate  12/13/22   [provider]  furosemide  (LASIX ) 20 MG tablet Take 1 tablet (20 mg total) by mouth 2 (two) times daily. 01/08/24   Milford, Harlene HERO, FNP  hydrocortisone  (ANUSOL -HC) 2.5 % rectal cream Place 1 Application rectally 2 (two) times daily. Patient not taking: Reported on 01/08/2024 01/07/24   Haviland, Julie, MD  ipratropium (ATROVENT)  0.06 % nasal spray Place 2 sprays into both nostrils daily as needed for rhinitis.    [provider]  losartan  (COZAAR ) 25 MG tablet Take 0.5 tablets (12.5 mg total) by mouth at bedtime. 01/08/24   Milford, Harlene HERO, FNP  methotrexate  (RHEUMATREX) 2.5 MG tablet Take 2.5 mg by mouth every Friday. 6 pills weekly    [provider]  mexiletine (MEXITIL ) 150 MG capsule Take  150 mg by mouth 2 (two) times daily.    [provider]  NITROSTAT  0.4 MG SL tablet DISSOLVE 1 TABLET UNDER TONGUE EVERY 5 MINUTES AS NEEDED FOR CHEST PAIN Patient taking differently: Place 0.4 mg under the tongue every 5 (five) minutes as needed for chest pain. 05/11/14   Debera Jayson MATSU, MD  ondansetron  (ZOFRAN ) 4 MG tablet Take 4 mg by mouth every 8 (eight) hours as needed for nausea. 01/15/23   [provider]  pantoprazole  (PROTONIX ) 40 MG tablet Take 1 tablet (40 mg total) by mouth 2 (two) times daily before a meal. Patient taking differently: Take 40 mg by mouth as needed. 01/06/24 03/06/24  Pokhrel, Laxman, MD  potassium chloride  SA (KLOR-CON  M) 20 MEQ tablet Take 1 tablet (20 mEq total) by mouth 2 (two) times daily. 01/08/24   Glena Harlene HERO, FNP  Probiotic Product (PROBIOTIC DAILY PO) Take 110 mg by mouth daily.    [provider]  rosuvastatin  (CRESTOR ) 5 MG tablet Take 5 mg by mouth every Sunday. Patient not taking: Reported on 01/08/2024    [provider]  senna-docusate (SENOKOT-S) 8.6-50 MG tablet Take 2 tablets by mouth at bedtime. 01/06/24 04/05/24  Pokhrel, Laxman, MD  sodium bicarbonate  650 MG tablet Take 650 mg by mouth daily.    [provider]  sucralfate (CARAFATE) 1 GM/10ML suspension Take 10 mLs (1 g total) by mouth 3 (three) times daily for 5 days. 01/06/24 01/11/24  Pokhrel, Laxman, MD  traMADol (ULTRAM) 50 MG tablet Take 50 mg by mouth every 6 (six) hours as needed for moderate pain (pain score 4-6).    [provider]    Past Medical History: Past Medical History:  Diagnosis Date   A-fib (HCC)    AICD (automatic cardioverter/defibrillator) present    Barrett's esophagus 2011   In [place] . Records in Epic   CHF (congestive heart failure) (HCC)    Chronic kidney disease    Coronary atherosclerosis of native coronary artery    Nonobstructive   Essential hypertension, benign    GERD (gastroesophageal  reflux disease)    Heart block AV first degree    Chronic   History of kidney stones    Hypercholesterolemia    Mixed hyperlipidemia    NSVT (nonsustained ventricular tachycardia) (HCC)    Holter monitor, 2011; recommended RF ablation by Dr. Kelsie - never required   PAF (paroxysmal atrial fibrillation) [facility])    Recently documented in [facility] June 2016   Pre-diabetes    Rheumatoid arthritis (HCC)    Sinus bradycardia    Symptomatic, acebutolol discontinued   Skin cancer    Sleep apnea    pt denies    Past Surgical History: Past Surgical History:  Procedure Laterality Date   CARDIAC CATHETERIZATION N/A 12/13/2015   Procedure: Left Heart Cath and Coronary Angiography;  Surgeon: Dorn JINNY Lesches, MD;  Location: [facility] INVASIVE CV LAB;  Service: Cardiovascular;  Laterality: N/A;   CARDIOVERSION N/A 04/11/2016   Procedure: CARDIOVERSION;  Surgeon: Annabella Scarce, MD;  Location: [facility] ENDOSCOPY;  Service: Cardiovascular;  Laterality: N/A;   COLONOSCOPY  03/13/2012   [place], [place]--Dr. Spainhour   EP IMPLANTABLE DEVICE N/A 02/01/2016   Procedure: Pacemaker Implant;  Surgeon: Lynwood Rakers, MD;  Location: [facility] INVASIVE CV LAB;  Service: Cardiovascular;  Laterality: N/A;   ESOPHAGOGASTRODUODENOSCOPY  03/13/2012   [place], [place]--Dr. Spainhour   ESOPHAGOGASTRODUODENOSCOPY N/A 01/04/2024   Procedure: EGD (ESOPHAGOGASTRODUODENOSCOPY);  Surgeon: Shaaron Lamar HERO, MD;  Location: [facility] ENDO SUITE;  Service: Endoscopy;  Laterality: N/A;   HERNIA REPAIR     2   IR ANGIO INTRA EXTRACRAN SEL COM CAROTID INNOMINATE BILAT MOD SED  08/27/2023   IR ANGIO VERTEBRAL SEL VERTEBRAL UNI L MOD SED  08/27/2023   IR CT HEAD LTD  01/12/2024   IR PERCUTANEOUS ART THROMBECTOMY/INFUSION INTRACRANIAL INC DIAG ANGIO  01/12/2024   IR US  GUIDE VASC ACCESS RIGHT  01/12/2024   KIDNEY STONE SURGERY     LEFT HEART CATHETERIZATION WITH CORONARY ANGIOGRAM N/A 08/20/2012   Procedure: LEFT HEART CATHETERIZATION WITH CORONARY  ANGIOGRAM;  Surgeon: Lynwood Schilling, MD;  Location: [facility] CATH LAB;  Service: Cardiovascular;  Laterality: N/A;   Left leg surgery  03/13/1977   RADIOLOGY WITH ANESTHESIA N/A 08/27/2023   Procedure: RADIOLOGY WITH ANESTHESIA;  Surgeon: Dolphus Carrion, MD;  Location: [facility] OR;  Service: Radiology;  Laterality: N/A;   RADIOLOGY WITH ANESTHESIA N/A 01/12/2024   Procedure: RADIOLOGY WITH ANESTHESIA;  Surgeon: Radiologist, Medication, MD;  Location: [facility] OR;  Service: Radiology;  Laterality: N/A;   SKIN CANCER EXCISION     TOTAL HIP ARTHROPLASTY Left 01/11/2022   Procedure: TOTAL HIP ARTHROPLASTY ANTERIOR APPROACH;  Surgeon: Fidel Rogue, MD;  Location: [facility] ORS;  Service: Orthopedics;  Laterality: Left;  150    Family History: Family History  Problem Relation Age of Onset   Colon cancer Mother    Other Sister         AGE-78-HEALTHY   Other Sister        AGE 20-HEALTHY   Other Sister        AGE 51 HEALTHY   Other Son        AGE 50 HEALTHY    Social History: Social History   Socioeconomic History   Marital status: Married    Spouse name: Not on file   Number of children: 1   Years of education: Not on file   Highest education level: Not on file  Occupational History   Occupation: [employer]    Comment: Quarry manager  Tobacco Use   Smoking status: Never    Passive exposure: Never   Smokeless tobacco: Never  Vaping Use   Vaping status: Never Used  Substance and Sexual Activity   Alcohol  use: Not Currently    Alcohol /week: 2.0 standard drinks of alcohol     Types: 2 Cans of beer per week    Comment: Occasional beer   Drug use: No   Sexual activity: Yes    Partners: Female  Other Topics Concern   Not on file  Social History Narrative   Daily caffeine    Social Drivers of Health   Financial Resource Strain: Low Risk  (07/01/2021)   Received from [facility]   Overall Financial Resource Strain (CARDIA)  Food Insecurity: No Food Insecurity  (01/13/2024)   Hunger Vital Sign    Worried About Running Out of Food in the Last Year: Never true    Ran Out of Food in the Last Year: Never true  Transportation Needs: No Transportation Needs (01/13/2024)  PRAPARE - Administrator, Civil Service (Medical): No    Lack of Transportation (Non-Medical): No  Physical Activity: Not on file  Stress: Not on file  Social Connections: Moderately Integrated (01/13/2024)   Social Connection and Isolation Panel    Frequency of Communication with Friends and Family: Three times a week    Frequency of Social Gatherings with Friends and Family: Twice a week    Attends Religious Services: 1 to 4 times per year    Active Member of [employer] or Organizations: No    Attends Banker Meetings: Never    Marital Status: Married    Allergies:  Allergies  Allergen Reactions   Amiodarone Other (See Comments)    Severe joint pain and blindness   Flecainide  Other (See Comments)    Severe headaches, blurred vision   Tamsulosin Hives and Itching   Tamsulosin Hcl Hives, Itching, Dermatitis and Rash   Carvedilol  Other (See Comments)    Unknown   Dapagliflozin Diarrhea   Eplerenone Other (See Comments)    Unknown   Lisinopril  Nausea And Vomiting and Nausea Only   Metoprolol  Other (See Comments)    Headaches, dizziness   Sacubitril-Valsartan Nausea And Vomiting and Nausea Only   Spironolactone  Swelling and Other (See Comments)    Breast swelling, headaches   Statins Other (See Comments)    Leg cramps   Sulfasalazine Nausea Only    Objective:    Vital Signs:   Temp:  [97.4 F (36.3 C)-98.3 F (36.8 C)] 98 F (36.7 C) (11/03 0800) Pulse Rate:  [68-75] 72 (11/03 1000) Resp:  [10-27] 17 (11/03 1000) BP: (116-152)/(64-90) 121/69 (11/03 1000) SpO2:  [96 %-100 %] 98 % (11/03 1000) Last BM Date :  (PTA)  Weight change: Filed Weights   01/12/24 1805  Weight: 54.3 kg   Intake/Output:  Intake/Output Summary (Last 24 hours) at  01/14/2024 1152 Last data filed at 01/14/2024 1000 Gross per 24 hour  Intake 1202.12 ml  Output 1625 ml  Net -422.88 ml    Physical Exam    General: Elderly appearing. No distress Cardiac: JVP flat. S1 and S2 present. No murmurs Extremities: Warm and dry.  No edema.  Neuro: Alert and oriented x3. Affect pleasant. Moves all extremities without difficulty.  Telemetry   VP 70 (personally reviewed)  Labs   Basic Metabolic Panel: Recent Labs  Lab 01/07/24 1743 01/12/24 1848 01/12/24 1850 01/14/24 0549  NA 140 139 141 139  K 4.2 3.1* 3.1* 4.9  CL 107 103 103 112*  CO2 20* 23  --  18*  GLUCOSE 114* 95 94 106*  BUN 30* 18 19 21   CREATININE 1.85* 1.85* 2.00* 1.65*  CALCIUM  9.1 8.9  --  8.6*   Liver Function Tests: Recent Labs  Lab 01/07/24 1743 01/12/24 1848  AST 36 31  ALT 31 26  ALKPHOS 91 95  BILITOT 0.7 0.6  PROT 6.7 6.7  ALBUMIN  3.9 4.0   CBC: Recent Labs  Lab 01/07/24 1743 01/12/24 1848 01/12/24 1850 01/14/24 0549  WBC 4.3 5.2  --  4.0  NEUTROABS 3.2 4.0  --   --   HGB 12.5* 12.6* 12.9* 10.1*  HCT 38.9* 37.9* 38.0* 31.1*  MCV 101.6* 99.7  --  99.7  PLT 171 520*  --  478*   BNP (last 3 results) Recent Labs    07/31/23 1440 09/21/23 1222 12/14/23 1100  BNP 1,699.0* 1,036.0* 399.8*   ProBNP (last 3 results) Recent Labs  01/02/24 1800  PROBNP 9,018.0*   CBG: Recent Labs  Lab 01/12/24 1806  GLUCAP 79   Coagulation Studies: Recent Labs    01/12/24 1848  LABPROT 15.3*  INR 1.1   Medications:    Current Medications:  Chlorhexidine  Gluconate Cloth  6 each Topical Daily   [START ON 01/18/2024] methotrexate   15 mg Oral Q Fri   mexiletine  150 mg Oral Q8H    Infusions:  sodium chloride  Stopped (01/14/24 0858)   clevidipine      heparin  750 Units/hr (01/14/24 1000)   Assessment/Plan   Acute CVA  - prior CVA in 6/25 (motor deficients resolved) - presented with L sided weakness - felt to be embolic due to un-anticoagulated Afib -  s/p mechanical thrombectomy - on heparin  gtt, will need AC at discharge, however complicated by recent GIB and anemia.  - stop heparin  tonight, will resume eliquis  2.5 mg bid (Cr and weight) - may benefit from watchman placement in the future, however must be anticoagulated for 6 weeks after procedure - spoke with EP, will place a urgent OP referral.  Chronic HFrEF, now with recovered EF - Nonischemic cardiomyopathy - s/p ICD - EF has recovered by 10/25 echo: EF 50-55% - GDMT limited by drug intolerance. - did not tolerate spiro, dig, hydral/nitrate, SGLT2i, or beta blockers.  - restart losartan  12.5 mg qhs - Not a candidate for advanced therapies. EF now improved.   Blood Loss Anemia Recent GIB - recent admit 10/25 with GIB - Endoscopy with oral ulcers - Now on heparin  with CVA, will need to transition to eliquis  prior to dc - hgb dropped 12.9>10.1. Check FOBT   Permanent atrial fibrillation - s/p multiple ablations - Currently on mexelitine 150 mg bid - referred to EP for Watchman (follows with Dr. Nancey) - on heparin , will need AC at discharge. 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 Referral placed to EP for Watchman consideration.  Length of Stay: 2  Jordan Lee, NP  01/14/2024, 11:52 AM  Advanced Heart Failure Team Pager [phone] (M-F; 7a - 5p)  Please contact [facility] for night-coverage after hours (4p -7a ) and weekends on [email or web address]   Patient seen and examined with the above-signed Advanced Practice  Provider and/or Housestaff. I personally reviewed laboratory data, imaging studies and relevant notes. I independently examined the patient and formulated the important aspects of the plan. I have edited the note to reflect any of my changes or salient points. I have personally discussed the plan with the patient and/or family.  78 y/o male as above with HF with recovered EF, CKD, permanent AF, previous CVA, recent GIB.   Now admitted with acute CVA about 1 week after stopping Eliquis  due to GI bleeding. EGD 01/04/24 with friable and erythematous gastric mucosa.   Underwent thrombectomy last night.   Now with mild LUE weakness  General:  Sitting up in chair . No resp difficulty HEENT: normal Neck: supple. no JVD.  Cor: Regular rate &  rhythm. No rubs, gallops or murmurs. Lungs: clear Abdomen: soft, nontender, nondistended.Good bowel sounds. Extremities: no cyanosis, clubbing, rash, edema Neuro: alert & orientedx3, cranial nerves grossly intact. LUE weak. Affect pleasant  Long discussion with patient and family about risk/benefits of AC in the setting of recurrent CVA with recent h/o GIB. Given results of EGD and fact that he had recurrent CVA 1 week after stopping Eliuis, I feel strongly that he should go back on Eliquis  with aggressive GI protection with bid PPI.   We have discussed case with EP and he may be candidate for Watchman in the near future but would require about 6 weeks of AC after placement.   He is agreeable to restarting Eliquis . Will stop heparin  and restart.   HF currently stable. See d/c meds as above.   We will arrange outpatient f/u in HF Clinic and with EP.   Toribio Fuel, MD  4:52 PM

## 2024-01-14 NOTE — Progress Notes (Signed)
 STROKE TEAM PROGRESS NOTE    SIGNIFICANT HOSPITAL EVENTS 11/1 presented with left-sided weakness.  CTA with right M2 occlusion.  Underwent right M2 thrombectomy with TICI 3..  Patient has A-fib and was off anticoagulation due to significant GI hemorrhage 10 days ago  INTERIM HISTORY/SUBJECTIVE Family at the bedside.  Patient sitting in the chair in no apparent distress. Neurological exam is improved NIH 0 K 4.9  today.  Not requiring any blood pressure medication MRI scan of the brain shows tiny punctate right insular infarct.  No hemorrhage.  He is on IV heparin .  Patient is willing to go back on Eliquis .  Await heart failure team consultation CBC    Component Value Date/Time   WBC 4.0 01/14/2024 0549   RBC 3.12 (L) 01/14/2024 0549   HGB 10.1 (L) 01/14/2024 0549   HCT 31.1 (L) 01/14/2024 0549   PLT 478 (H) 01/14/2024 0549   MCV 99.7 01/14/2024 0549   MCH 32.4 01/14/2024 0549   MCHC 32.5 01/14/2024 0549   RDW 17.2 (H) 01/14/2024 0549   LYMPHSABS 0.5 (L) 01/12/2024 1848   MONOABS 0.7 01/12/2024 1848   EOSABS 0.1 01/12/2024 1848   BASOSABS 0.0 01/12/2024 1848    BMET    Component Value Date/Time   NA 139 01/14/2024 0549   NA 139 03/30/2023 1026   K 4.9 01/14/2024 0549   CL 112 (H) 01/14/2024 0549   CO2 18 (L) 01/14/2024 0549   GLUCOSE 106 (H) 01/14/2024 0549   BUN 21 01/14/2024 0549   BUN 28 (H) 03/30/2023 1026   CREATININE 1.65 (H) 01/14/2024 0549   CREATININE 1.33 (H) 01/24/2016 1336   CALCIUM  8.6 (L) 01/14/2024 0549   EGFR 28 (L) 03/30/2023 1026   GFRNONAA 43 (L) 01/14/2024 0549    IMAGING past 24 hours   Vitals:   01/14/24 0900 01/14/24 1000 01/14/24 1100 01/14/24 1200  BP: (!) 152/86 121/69 135/70 126/69  Pulse: 71 72 70 70  Resp: 13 17 14  (!) 24  Temp:    (!) 97.3 F (36.3 C)  TempSrc:    Oral  SpO2: 100% 98% 99% 100%  Weight:      Height:         PHYSICAL EXAM General:  Alert, well-nourished, well-developed patient in no acute distress Psych:   Mood and affect appropriate for situation CV: Regular rate and rhythm on monitor Respiratory:  Regular, unlabored respirations on room air GI: Abdomen soft and nontender   NEURO:  Mental Status: AA&Ox3, patient is able to give clear and coherent history Speech/Language: speech is without dysarthria or aphasia.  Naming, repetition, fluency, and comprehension intact.  Cranial Nerves:  II: PERRL. Visual fields full.  III, IV, VI: EOMI. Eyelids elevate symmetrically.  V: Sensation is intact to light touch and symmetrical to face.  VII: Face is symmetrical resting and smiling VIII: hearing intact to voice. IX, X: Palate elevates symmetrically. Phonation is normal.  KP:Dynloizm shrug 5/5. XII: tongue is midline without fasciculations. Motor: 5/5 strength to all muscle groups tested.  Tone: is normal and bulk is normal Sensation- Intact to light touch bilaterally. Extinction absent to light touch to DSS.   Coordination: FTN intact bilaterally, HKS: no ataxia in BLE.No drift.  Gait- deferred  Most Recent NIH 0   ASSESSMENT/PLAN  [name]. is a 78 y.o. male with history of A-fib on Eliquis , Eliquis  recently stopped due to rectal bleeding s/p PPM, CHF, CKD, CAD, cardiomyopathy, history of CVA hypertension, GERD, hyperlipidemia, rheumatoid  arthritis, sleep apnea, presented with left-sided weakness and slurred speech NIH on Admission 14  Acute Ischemic Infarct:  right MCA s/p mechanical thrombectomy of right M2 occlusion with complete revascularization TICI 3 Etiology: Cardioembolic in the setting of A-fib off of his anticoagulation due to recent GI bleed Code Stroke  CT head No acute abnormality. ASPECTS 10.    CTA head & neck 1. Occlusion of a right MCA distal M2 branch in the posterior sylvian fissure. 2. Multifocal narrowing of the left PCA P2 segment with maintained patency. 3. Atherosclerotic calcification at the right carotid bifurcation extending into the proximal  internal and external carotid arteries without hemodynamically significant stenosis. 4. Left carotid bifurcation atherosclerosis without hemodynamically significant stenosis. MRI ordered 2D Echo 01/03/2024 EF 55%.  Left atrium and right atrium severely dilated.  Moderate to severe mitral valve regurgitation.  Mild prolapse of posterior leaflet of mitral valve LDL 54 HgbA1c 5.6 VTE prophylaxis -SCDs No antithrombotic prior to admission, now on aspirin  325 mg daily.  Will need to talk with cardiology and GI in terms of restarting anticoagulation Therapy recommendations:  Pending Disposition: Pending  Atrial fibrillation Home Meds: Eliquis  however been on hold since 01/06/2024 in setting of recent GI bleed Continue telemetry monitoring Will need to consider restarting DOAC or IV heparin , after discussion with GI and cardiology  Hypertension CHF CAD Cardiomyopathy Home meds: Lasix  20 mg, losartan  12.5 mg Stable Blood Pressure Goal: SBP 120-160 for first 24 hours then less than 180   Hyperlipidemia Home meds: None, resumed in hospital LDL 54, goal < 70 High intensity statin not indicated due to below goal Continue statin at discharge  Dysphagia Patient has post-stroke dysphagia, SLP consulted    Diet   Diet regular Room service appropriate? Yes; Fluid consistency: Thin   Advance diet as tolerated  Other Stroke Risk Factors Coronary artery disease Congestive heart failure Obstructive sleep apnea, on CPAP at home   Other Active Problems CKD-creatinine 1.85  Hospital day # 2    Patient presented with sudden onset of left hemiparesis due to right M2 occlusion and did not receive TNK due to recent GI hemorrhage 10 days ago but underwent successful mechanical thrombectomy with excellent clinical outcome.  CT head showed no hemorrhage or large stroke.  Plan to   IV heparin  and switch back to Eliquis  tonight since we do not have any safe out of better options to anticoagulation..      Long discussion with patient and his wife at the bedside regarding risk benefits and alternatives to anticoagulation.  Patient wants to undergo Watchman device but he will still need short-term anticoagulation.  Await consult from heart failure team for his cardiac management as he is the patient.  Plan to mobilize out of bed.  Transfer to neurology floor bed.  Likely discharge home tomorrow if stable hematocrit     I personally spent a total of 50 minutes in the care of the patient today including getting/reviewing separately obtained history, performing a medically appropriate exam/evaluation, counseling and educating, placing orders, referring and communicating with other health care professionals, documenting clinical information in the EHR, independently interpreting results, and coordinating care.          Eather Popp, MD Medical Director [facility] Pager: [phone] 01/14/2024 12:57 PM   To contact Stroke Continuity provider, please refer to [email or web address]. After hours, contact General Neurology

## 2024-01-14 NOTE — TOC CM/SW Note (Signed)
 Transition of Care [facility]) - Inpatient Brief Assessment   Patient Details  Name: Brent Ray. MRN: [id number] Date of Birth: 1945-11-13  Transition of Care [facility]) CM/SW Contact:    Josepha Mliss HERO, RN Phone Number: 01/14/2024, 4:12 PM   Clinical Narrative: Pt is a 78 y.o. male who presented 01/12/24 with L-sided weakness and slurred speech. Not a candidate for TNK. 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 S/p successful thrombectomy 11/1. Head CT negative.   PT/OT evals in progress; Inpatient Care Management will follow for home needs as patient progresses.     Transition of Care Asessment: Insurance and Status: Insurance coverage has been reviewed Patient has primary care physician: Yes (Dr. Meade) Home environment has been reviewed: home with spouse Prior level of function:: Independent Prior/Current Home Services: No current home services Social Drivers of Health Review: SDOH reviewed no interventions necessary Readmission risk has been reviewed: Yes Transition of care needs: no transition of care needs at this time][  Mliss MICAEL Josepha, RN, BSN  Trauma/Neuro ICU Case Manager [phone]

## 2024-01-14 NOTE — TOC Initial Note (Signed)
 Transition of Care (TOC) - Initial/Assessment Note    Patient Details  Name: Brent Ray. MRN: [id number] Date of Birth: 11/27/1945  Transition of Care [facility]) CM/SW Contact:    Arlana JINNY Nicholaus ISRAEL Phone Number: [phone] 01/14/2024, 4:22 PM  Clinical Narrative:  HF CSW met with patient and wife at bedside. Patient lives with spouse. Patient stated that he has no history of HH services. Patient stated that he currently does not use any equipment. However he has  equipment at home if needed. Patient stated that he has a scale. Patient stated that he  has a PCP. CSW explained that a hospital follow up appointment is typically scheduled closer towards dc. Patients wife stated that he already has a follow up scheduled for Wednesday, January 23, 2024 at 3 PM. Patients wife will provide transportation home.   HF CSW/CM will continue to follow and monitor for dc readiness.                        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) 01/04/2024   Presence of cardiac resynchronization therapy defibrillator (CRT-D) 01/04/2024   Long term (current) use of anticoagulants 01/04/2024   Severe mitral regurgitation 01/04/2024   Anemia 01/04/2024   Protein-calorie malnutrition, severe 01/04/2024   Gastric erosion 01/04/2024   History of stroke 01/02/2024   Macrocytic anemia 01/02/2024   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   Internal carotid  artery occlusion, 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   Dizziness 08/21/2023   Metabolic acidosis 07/31/2023   Macrocytosis 07/31/2023   Shortness of breath 07/31/2023   Rheumatism 07/31/2023   Peptic ulcer disease 07/31/2023   Dyslipidemia 07/31/2023   Preop cardiovascular exam 04/02/2023   AKI (acute kidney injury) 04/02/2023   Chronic systolic CHF (congestive heart failure) (HCC) 01/19/2023   Paroxysmal atrial flutter (HCC) 01/19/2023   Drug-induced gynecomastia 01/19/2023   Moderate mitral regurgitation 01/19/2023   Moderate tricuspid regurgitation 01/19/2023   History of GI bleed 01/19/2023   CKD (chronic kidney disease) stage 3, GFR 30-59 ml/min (HCC) 01/19/2023   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   Avascular necrosis of hip, 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/11/2022   Dysphagia 12/08/2019   Abdominal pain 12/08/2019   Rectal bleeding 12/08/2019   Persistent atrial fibrillation (HCC) 02/29/2016   History of panic attacks 02/29/2016   Second degree AV block 02/01/2016   D-dimer, elevated    Permanent atrial fibrillation (HCC)    NICM (nonischemic cardiomyopathy) (HCC)    Anticoagulated  Ischemic chest pain 12/13/2015   Fatigue 11/09/2014   Internal hemorrhoids 10/31/2013   Esophageal reflux 10/31/2013   CAD in native artery    Sinus bradycardia    Heart block AV first degree    NSVT (nonsustained ventricular tachycardia) (HCC)    Mixed hyperlipidemia    Bradycardia 08/21/2012   Chest pain 01/21/2012   PAF (paroxysmal atrial fibrillation) (HCC) 04/11/2010   PALPITATIONS 03/02/2010   Frequent PVCs 02/01/2010   Rheumatoid arthritis (HCC) 02/01/2010   Essential hypertension, benign 01/31/2010   PCP:  Meade Bigness, MD Pharmacy:   [facility] [phone] [place], [place] - [address] AT [facility] [address] [place] [address] Phone: [phone] Fax: [phone]  [facility] [address] [place] [address] Phone:  [phone] Fax: [phone]     Social Drivers of Health (SDOH) Social History: SDOH Screenings   Food Insecurity: No Food Insecurity (01/13/2024)  Housing: Low Risk  (01/13/2024)  Transportation Needs: No Transportation Needs (01/13/2024)  Utilities: Not At Risk (01/13/2024)  Financial Resource Strain: Low Risk  (07/01/2021)   Received from [facility]  Social Connections: Moderately Integrated (01/13/2024)  Tobacco Use: Low Risk  (01/12/2024)   SDOH Interventions:     Readmission Risk Interventions    01/04/2024    2:30 PM  Readmission Risk Prevention Plan  Transportation Screening Complete  PCP or Specialist Appt within 3-5 Days Not Complete  HRI or Home Care Consult Complete  Social Work Consult for Recovery Care Planning/Counseling Complete  Palliative Care Screening Not Applicable  Medication Review Oceanographer) Complete

## 2024-01-14 NOTE — CV Procedure (Signed)
  Device system confirmed to be MRI conditional, with implant date > 6 weeks ago, and no evidence of abandoned or epicardial leads in review of most recent CXR  Device last cleared by EP Provider: Beecher, on 01/14/24  Clearance is good through for 1 year as long as parameters remain stable at time of check. If pt undergoes a cardiac device procedure during that time, they should be re-cleared.   Tachy-therapies to be programmed off if applicable with device back to pre-MRI settings after completion of exam.  [employer] - Programming recommendation received through [employer] App/Tablet  Tobias LITTIE Leander, RT  01/14/2024 9:34 AM

## 2024-01-14 NOTE — Progress Notes (Signed)
 PHARMACY - ANTICOAGULATION CONSULT NOTE  Pharmacy Consult for heparin  Indication: atrial fibrillation and stroke  Allergies  Allergen Reactions   Amiodarone Other (See Comments)    Severe joint pain and blindness   Flecainide  Other (See Comments)    Severe headaches, blurred vision   Tamsulosin Hives and Itching   Tamsulosin Hcl Hives, Itching, Dermatitis and Rash   Carvedilol  Other (See Comments)    Unknown   Dapagliflozin Diarrhea   Eplerenone Other (See Comments)    Unknown   Lisinopril  Nausea And Vomiting and Nausea Only   Metoprolol  Other (See Comments)    Headaches, dizziness   Sacubitril-Valsartan Nausea And Vomiting and Nausea Only   Spironolactone  Swelling and Other (See Comments)    Breast swelling, headaches   Statins Other (See Comments)    Leg cramps   Sulfasalazine Nausea Only    Patient Measurements: Height: 5' 8 (172.7 cm) Weight: 54.3 kg (119 lb 12.8 oz) IBW/kg (Calculated) : 68.4 HEPARIN  DW (KG): 54.3  Vital Signs: Temp: 97.3 F (36.3 C) (11/03 1200) Temp Source: Oral (11/03 1200) BP: 126/69 (11/03 1200) Pulse Rate: 70 (11/03 1200)  Labs: Recent Labs    01/12/24 1848 01/12/24 1850 01/13/24 2235 01/14/24 0549 01/14/24 1203  HGB 12.6* 12.9*  --  10.1*  --   HCT 37.9* 38.0*  --  31.1*  --   PLT 520*  --   --  478*  --   APTT 29  --  54*  --   --   LABPROT 15.3*  --   --   --   --   INR 1.1  --   --   --   --   HEPARINUNFRC  --   --  0.17* 0.29* 0.39  CREATININE 1.85* 2.00*  --  1.65*  --     Estimated Creatinine Clearance: 29.3 mL/min (A) (by C-G formula based on SCr of 1.65 mg/dL (H)).   Medical History: Past Medical History:  Diagnosis Date   A-fib [facility])    AICD (automatic cardioverter/defibrillator) present    Barrett's esophagus 2011   In [place] . Records in Epic   CHF (congestive heart failure) (HCC)    Chronic kidney disease    Coronary atherosclerosis of native coronary artery    Nonobstructive   Essential hypertension,  benign    GERD (gastroesophageal reflux disease)    Heart block AV first degree    Chronic   History of kidney stones    Hypercholesterolemia    Mixed hyperlipidemia    NSVT (nonsustained ventricular tachycardia) (HCC)    Holter monitor, 2011; recommended RF ablation by Dr. Kelsie - never required   PAF (paroxysmal atrial fibrillation) (HCC)    Recently documented in [facility] June 2016   Pre-diabetes    Rheumatoid arthritis (HCC)    Sinus bradycardia    Symptomatic, acebutolol discontinued   Skin cancer    Sleep apnea    pt denies    Medications:  Medications Prior to Admission  Medication Sig Dispense Refill Last Dose/Taking   acetaminophen  (TYLENOL ) 325 MG tablet Take 650 mg by mouth as needed for mild pain (pain score 1-3).      [Paused] apixaban  (ELIQUIS ) 2.5 MG TABS tablet Take 1 tablet (2.5 mg total) by mouth 2 (two) times daily. 60 tablet 0    chlorhexidine  (PERIDEX ) 0.12 % solution Use as directed 5 mLs in the mouth or throat 2 (two) times daily.      folic acid  (FOLVITE ) 1 MG tablet Take  1 mg by mouth daily.  NOT on Friday d/t methotrexate       furosemide  (LASIX ) 20 MG tablet Take 1 tablet (20 mg total) by mouth 2 (two) times daily. 180 tablet 3    hydrocortisone  (ANUSOL -HC) 2.5 % rectal cream Place 1 Application rectally 2 (two) times daily. (Patient not taking: Reported on 01/08/2024) 30 g 0    ipratropium (ATROVENT) 0.06 % nasal spray Place 2 sprays into both nostrils daily as needed for rhinitis.      losartan  (COZAAR ) 25 MG tablet Take 0.5 tablets (12.5 mg total) by mouth at bedtime. 45 tablet 3    methotrexate  (RHEUMATREX) 2.5 MG tablet Take 2.5 mg by mouth every Friday. 6 pills weekly      mexiletine (MEXITIL ) 150 MG capsule Take 150 mg by mouth 2 (two) times daily.      NITROSTAT  0.4 MG SL tablet DISSOLVE 1 TABLET UNDER TONGUE EVERY 5 MINUTES AS NEEDED FOR CHEST PAIN (Patient taking differently: Place 0.4 mg under the tongue every 5 (five) minutes as needed for chest  pain.) 25 tablet 3    ondansetron  (ZOFRAN ) 4 MG tablet Take 4 mg by mouth every 8 (eight) hours as needed for nausea.      pantoprazole  (PROTONIX ) 40 MG tablet Take 1 tablet (40 mg total) by mouth 2 (two) times daily before a meal. (Patient taking differently: Take 40 mg by mouth as needed.) 120 tablet 2    potassium chloride  SA (KLOR-CON  M) 20 MEQ tablet Take 1 tablet (20 mEq total) by mouth 2 (two) times daily. 180 tablet 3    Probiotic Product (PROBIOTIC DAILY PO) Take 110 mg by mouth daily.      rosuvastatin  (CRESTOR ) 5 MG tablet Take 5 mg by mouth every Sunday. (Patient not taking: Reported on 01/08/2024)      senna-docusate (SENOKOT-S) 8.6-50 MG tablet Take 2 tablets by mouth at bedtime. 180 tablet 0    sodium bicarbonate  650 MG tablet Take 650 mg by mouth daily.      sucralfate (CARAFATE) 1 GM/10ML suspension Take 10 mLs (1 g total) by mouth 3 (three) times daily for 5 days. 150 mL 0    traMADol (ULTRAM) 50 MG tablet Take 50 mg by mouth every 6 (six) hours as needed for moderate pain (pain score 4-6).       Assessment: 31 yom  who presented to [facility] with left sided weakness and slurred speech. Code stroke was called and he was found to have right M2 occlusion. He was transferred to [facility] and is s/p right M2 thrombectomy on 11/1. He has a hx Afib/CVA and was on apixaban  but this has been on hold since recent admit 10/22-10/26 for GIB. Confirmed with patient last dose was 10/22.   No bleeding noted, heparin  level this afternoon therapeutic at 0.39. Level collected this morning resulted at 0.29 however was drawn ~ 3 hours early. HgB 10.1 and PLTs 478.   Goal of Therapy:  Heparin  level 0.3-0.5 units/ml Monitor platelets by anticoagulation protocol: Yes   Plan:  Continue heparin  to 750 units/hr Heparin  level in 8 hours Monitor for s/sx of bleeding F/u ability to transition back to DOAC, likely expecting 2.5mg  BID of Eliquis , cardiology being consulted for Watchmann eval.   Powell Blush,  PharmD, BCCCP  Clinical Pharmacist Phone: [phone]

## 2024-01-15 ENCOUNTER — Ambulatory Visit

## 2024-01-15 ENCOUNTER — Other Ambulatory Visit (HOSPITAL_COMMUNITY): Payer: Self-pay

## 2024-01-15 LAB — CBC
HCT: 30.7 % — ABNORMAL LOW (ref 39.0–52.0)
Hemoglobin: 9.8 g/dL — ABNORMAL LOW (ref 13.0–17.0)
MCH: 32.3 pg (ref 26.0–34.0)
MCHC: 31.9 g/dL (ref 30.0–36.0)
MCV: 101.3 fL — ABNORMAL HIGH (ref 80.0–100.0)
Platelets: 486 K/uL — ABNORMAL HIGH (ref 150–400)
RBC: 3.03 MIL/uL — ABNORMAL LOW (ref 4.22–5.81)
RDW: 16.9 % — ABNORMAL HIGH (ref 11.5–15.5)
WBC: 3.5 K/uL — ABNORMAL LOW (ref 4.0–10.5)
nRBC: 0 % (ref 0.0–0.2)

## 2024-01-15 MED ORDER — APIXABAN 2.5 MG PO TABS
2.5000 mg | ORAL_TABLET | Freq: Two times a day (BID) | ORAL | 0 refills | Status: AC
  Filled 2024-01-15: qty 60, 30d supply, fill #0

## 2024-01-15 NOTE — TOC Initial Note (Signed)
 Transition of Care (TOC) - Initial/Assessment Note    Patient Details  Name: Brent Ray. MRN: [id number] Date of Birth: 28-Jun-1945  Transition of Care [facility]) CM/SW Contact:    Andrez JULIANNA George, RN Phone Number: 01/15/2024, 12:43 PM  Clinical Narrative:                 Brent Ray. is a 78 y.o. male with a history of atrial fibrillation, who was recently stopped from his anticoagulation due to rectal bleeding who presented with left-sided weakness.  Pt is from home with his spouse. They are together most of the time.  Wife manages his medications and provides needed transportation. DME at home: shower seat/ cane/ walker/ [facility] [place]  Home health recommended. Pt provided choice and selected [facility]. Information on the AVS. [facility] accepted the referral and will contact the patient for the first home visit. IP Care management following.  Expected Discharge Plan: Home w Home Health Services Barriers to Discharge: Continued Medical Work up   Patient Goals and CMS Choice   CMS Medicare.gov Compare Post Acute Care list provided to:: Patient Choice offered to / list presented to : Patient, Spouse      Expected Discharge Plan and Services   Discharge Planning Services: CM Consult Post Acute Care Choice: Home Health Living arrangements for the past 2 months: Single Family Home                           HH Arranged: PT HH Agency: [facility] ([facility]) Date [facility] Agency Contacted: 01/15/24   Representative spoke with at [facility] Agency: Jon  Prior Living Arrangements/Services Living arrangements for the past 2 months: Single Family Home Lives with:: Spouse Patient language and need for interpreter reviewed:: Yes Do you feel safe going back to the place where you live?: Yes        Care giver support system in place?: Yes (comment)   Criminal Activity/Legal Involvement Pertinent to Current Situation/Hospitalization: No - Comment as needed  Activities of  Daily Living      Permission Sought/Granted                  Emotional Assessment Appearance:: Appears stated age Attitude/Demeanor/Rapport: Engaged Affect (typically observed): Accepting Orientation: : Oriented to Self, Oriented to Place, Oriented to Situation, Oriented to  Time   Psych Involvement: No (comment)  Admission diagnosis:  Stroke (cerebrum) [facility]) [I63.9] Patient Active Problem List   Diagnosis Date Noted   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   Upper GI bleed 01/05/2024   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) 01/04/2024   Presence of cardiac resynchronization therapy defibrillator (CRT-D) 01/04/2024   Long term (current) use of anticoagulants 01/04/2024   Severe mitral regurgitation 01/04/2024   Anemia 01/04/2024   Protein-calorie malnutrition, severe 01/04/2024   Gastric erosion 01/04/2024   History of stroke 01/02/2024   Macrocytic anemia 01/02/2024   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   Internal carotid artery occlusion, 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   Dizziness 08/21/2023   Metabolic acidosis 07/31/2023   Macrocytosis 07/31/2023   Shortness of breath 07/31/2023   Rheumatism 07/31/2023   Peptic ulcer disease 07/31/2023   Dyslipidemia 07/31/2023   Preop cardiovascular exam 04/02/2023   AKI (acute kidney injury) 04/02/2023   Chronic systolic CHF (congestive heart failure) (HCC) 01/19/2023   Paroxysmal atrial flutter (HCC) 01/19/2023   Drug-induced gynecomastia 01/19/2023   Moderate mitral regurgitation 01/19/2023   Moderate tricuspid regurgitation  01/19/2023   History of GI bleed 01/19/2023   CKD (chronic kidney disease) stage 3, GFR 30-59 ml/min (HCC) 01/19/2023   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   Avascular necrosis of hip, 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/11/2022   Dysphagia 12/08/2019   Abdominal pain 12/08/2019   Rectal bleeding 12/08/2019   Persistent atrial fibrillation (HCC) 02/29/2016   History of panic attacks  02/29/2016   Second degree AV block 02/01/2016   D-dimer, elevated    Permanent atrial fibrillation (HCC)    NICM (nonischemic cardiomyopathy) (HCC)    Anticoagulated    Ischemic chest pain 12/13/2015   Fatigue 11/09/2014   Internal hemorrhoids 10/31/2013   Esophageal reflux 10/31/2013   CAD in native artery    Sinus bradycardia    Heart block AV first degree    NSVT (nonsustained ventricular tachycardia) (HCC)    Mixed hyperlipidemia    Bradycardia 08/21/2012   Chest pain 01/21/2012   PAF (paroxysmal atrial fibrillation) (HCC) 04/11/2010   PALPITATIONS 03/02/2010   Frequent PVCs 02/01/2010   Rheumatoid arthritis (HCC) 02/01/2010   Essential hypertension, benign 01/31/2010   PCP:  Meade Bigness, MD Pharmacy:   [facility]ORE [phone] [place], [place] - [address] AT [facility] [address] [place] [address] Phone: [phone] Fax: [phone]  [facility] [address] [place] [address] Phone: [phone] Fax: [phone]     Social Drivers of Health (SDOH) Social History: SDOH Screenings   Food Insecurity: No Food Insecurity (01/13/2024)  Housing: Low Risk  (01/13/2024)  Transportation Needs: No Transportation Needs (01/13/2024)  Utilities: Not At Risk (01/13/2024)  Financial Resource Strain: Low Risk  (07/01/2021)   Received from [facility]  Social Connections: Moderately Integrated (01/13/2024)  Tobacco Use: Low Risk  (01/12/2024)   SDOH Interventions:     Readmission Risk Interventions    01/04/2024    2:30 PM  Readmission Risk Prevention Plan  Transportation Screening Complete  PCP or Specialist Appt within 3-5 Days Not Complete  HRI or Home Care Consult Complete  Social Work Consult for Recovery Care Planning/Counseling Complete  Palliative Care Screening Not Applicable  Medication Review Oceanographer) Complete

## 2024-01-15 NOTE — Progress Notes (Signed)
 Volunteers wheeled pt off unit instructed to stop by [facility] pharmacy. IV removed> Tele removed CCMD notified. Patient and his wife June stated understanding of AVS

## 2024-01-15 NOTE — Plan of Care (Signed)
 Problem: Education: Goal: Knowledge of disease or condition will improve 01/15/2024 0426 by Jori Roderic CROME, RN Outcome: Progressing 01/15/2024 0425 by Jori Roderic CROME, RN Outcome: Progressing 01/15/2024 0424 by Jori Roderic CROME, RN Outcome: Progressing Goal: Knowledge of secondary prevention will improve (MUST DOCUMENT ALL) 01/15/2024 0426 by Jori Roderic CROME, RN Outcome: Progressing 01/15/2024 0425 by Jori Roderic CROME, RN Outcome: Progressing 01/15/2024 0424 by Jori Roderic CROME, RN Outcome: Progressing Goal: Knowledge of patient specific risk factors will improve (DELETE if not current risk factor) 01/15/2024 0426 by Jori Roderic CROME, RN Outcome: Progressing 01/15/2024 0425 by Jori Roderic CROME, RN Outcome: Progressing 01/15/2024 0424 by Jori Roderic CROME, RN Outcome: Progressing   Problem: Ischemic Stroke/TIA Tissue Perfusion: Goal: Complications of ischemic stroke/TIA will be minimized 01/15/2024 0426 by Jori Roderic CROME, RN Outcome: Progressing 01/15/2024 0425 by Jori Roderic CROME, RN Outcome: Progressing 01/15/2024 0424 by Jori Roderic CROME, RN Outcome: Progressing   Problem: Coping: Goal: Will verbalize positive feelings about self 01/15/2024 0426 by Jori Roderic CROME, RN Outcome: Progressing 01/15/2024 0425 by Jori Roderic CROME, RN Outcome: Progressing 01/15/2024 0424 by Jori Roderic CROME, RN Outcome: Progressing Goal: Will identify appropriate support needs 01/15/2024 0426 by Jori Roderic CROME, RN Outcome: Progressing 01/15/2024 0425 by Jori Roderic CROME, RN Outcome: Progressing 01/15/2024 0424 by Jori Roderic CROME, RN Outcome: Progressing   Problem: Health Behavior/Discharge Planning: Goal: Ability to manage health-related needs will improve 01/15/2024 0426 by Jori Roderic CROME, RN Outcome: Progressing 01/15/2024 0425 by Jori Roderic CROME, RN Outcome: Progressing 01/15/2024 0424 by Jori Roderic CROME,  RN Outcome: Progressing Goal: Goals will be collaboratively established with patient/family 01/15/2024 0426 by Jori Roderic CROME, RN Outcome: Progressing 01/15/2024 0425 by Jori Roderic CROME, RN Outcome: Progressing 01/15/2024 0424 by Jori Roderic CROME, RN Outcome: Progressing   Problem: Self-Care: Goal: Ability to participate in self-care as condition permits will improve 01/15/2024 0426 by Jori Roderic CROME, RN Outcome: Progressing 01/15/2024 0425 by Jori Roderic CROME, RN Outcome: Progressing 01/15/2024 0424 by Jori Roderic CROME, RN Outcome: Progressing Goal: Verbalization of feelings and concerns over difficulty with self-care will improve 01/15/2024 0426 by Jori Roderic CROME, RN Outcome: Progressing 01/15/2024 0425 by Jori Roderic CROME, RN Outcome: Progressing 01/15/2024 0424 by Jori Roderic CROME, RN Outcome: Progressing Goal: Ability to communicate needs accurately will improve 01/15/2024 0426 by Jori Roderic CROME, RN Outcome: Progressing 01/15/2024 0425 by Jori Roderic CROME, RN Outcome: Progressing 01/15/2024 0424 by Jori Roderic CROME, RN Outcome: Progressing   Problem: Nutrition: Goal: Risk of aspiration will decrease 01/15/2024 0426 by Jori Roderic CROME, RN Outcome: Progressing 01/15/2024 0425 by Jori Roderic CROME, RN Outcome: Progressing 01/15/2024 0424 by Jori Roderic CROME, RN Outcome: Progressing Goal: Dietary intake will improve 01/15/2024 0426 by Jori Roderic CROME, RN Outcome: Progressing 01/15/2024 0425 by Jori Roderic CROME, RN Outcome: Progressing 01/15/2024 0424 by Jori Roderic CROME, RN Outcome: Progressing   Problem: Education: Goal: Knowledge of General Education information will improve Description: Including pain rating scale, medication(s)/side effects and non-pharmacologic comfort measures 01/15/2024 0426 by Jori Roderic CROME, RN Outcome: Progressing 01/15/2024 0425 by Jori Roderic CROME, RN Outcome:  Progressing 01/15/2024 0424 by Jori Roderic CROME, RN Outcome: Progressing   Problem: Health Behavior/Discharge Planning: Goal: Ability to manage health-related needs will improve 01/15/2024 0426 by Jori Roderic CROME, RN Outcome: Progressing 01/15/2024 0425 by Jori Roderic CROME, RN Outcome: Progressing 01/15/2024 0424 by Jori Roderic CROME, RN Outcome: Progressing   Problem: Clinical Measurements: Goal: Ability to maintain clinical measurements within normal limits will improve 01/15/2024  [id number] by Jori Roderic CROME, RN Outcome: Progressing 01/15/2024 0425 by Jori Roderic CROME, RN Outcome: Progressing 01/15/2024 0424 by Jori Roderic CROME, RN Outcome: Progressing Goal: Will remain free from infection 01/15/2024 0426 by Jori Roderic CROME, RN Outcome: Progressing 01/15/2024 0425 by Jori Roderic CROME, RN Outcome: Progressing 01/15/2024 0424 by Jori Roderic CROME, RN Outcome: Progressing Goal: Diagnostic test results will improve 01/15/2024 0426 by Jori Roderic CROME, RN Outcome: Progressing 01/15/2024 0425 by Jori Roderic CROME, RN Outcome: Progressing 01/15/2024 0424 by Jori Roderic CROME, RN Outcome: Progressing Goal: Respiratory complications will improve 01/15/2024 0426 by Jori Roderic CROME, RN Outcome: Progressing 01/15/2024 0425 by Jori Roderic CROME, RN Outcome: Progressing 01/15/2024 0424 by Jori Roderic CROME, RN Outcome: Progressing Goal: Cardiovascular complication will be avoided 01/15/2024 0426 by Jori Roderic CROME, RN Outcome: Progressing 01/15/2024 0425 by Jori Roderic CROME, RN Outcome: Progressing 01/15/2024 0424 by Jori Roderic CROME, RN Outcome: Progressing   Problem: Activity: Goal: Risk for activity intolerance will decrease 01/15/2024 0426 by Jori Roderic CROME, RN Outcome: Progressing 01/15/2024 0425 by Jori Roderic CROME, RN Outcome: Progressing 01/15/2024 0424 by Jori Roderic CROME, RN Outcome: Progressing    Problem: Nutrition: Goal: Adequate nutrition will be maintained 01/15/2024 0426 by Jori Roderic CROME, RN Outcome: Progressing 01/15/2024 0425 by Jori Roderic CROME, RN Outcome: Progressing 01/15/2024 0424 by Jori Roderic CROME, RN Outcome: Progressing   Problem: Coping: Goal: Level of anxiety will decrease 01/15/2024 0426 by Jori Roderic CROME, RN Outcome: Progressing 01/15/2024 0425 by Jori Roderic CROME, RN Outcome: Progressing 01/15/2024 0424 by Jori Roderic CROME, RN Outcome: Progressing   Problem: Elimination: Goal: Will not experience complications related to bowel motility 01/15/2024 0426 by Jori Roderic CROME, RN Outcome: Progressing 01/15/2024 0425 by Jori Roderic CROME, RN Outcome: Progressing 01/15/2024 0424 by Jori Roderic CROME, RN Outcome: Progressing Goal: Will not experience complications related to urinary retention 01/15/2024 0426 by Jori Roderic CROME, RN Outcome: Progressing 01/15/2024 0425 by Jori Roderic CROME, RN Outcome: Progressing 01/15/2024 0424 by Jori Roderic CROME, RN Outcome: Progressing   Problem: Pain Managment: Goal: General experience of comfort will improve and/or be controlled 01/15/2024 0426 by Jori Roderic CROME, RN Outcome: Progressing 01/15/2024 0425 by Jori Roderic CROME, RN Outcome: Progressing 01/15/2024 0424 by Jori Roderic CROME, RN Outcome: Progressing   Problem: Safety: Goal: Ability to remain free from injury will improve 01/15/2024 0426 by Jori Roderic CROME, RN Outcome: Progressing 01/15/2024 0425 by Jori Roderic CROME, RN Outcome: Progressing 01/15/2024 0424 by Jori Roderic CROME, RN Outcome: Progressing   Problem: Skin Integrity: Goal: Risk for impaired skin integrity will decrease 01/15/2024 0426 by Jori Roderic CROME, RN Outcome: Progressing 01/15/2024 0425 by Jori Roderic CROME, RN Outcome: Progressing 01/15/2024 0424 by Jori Roderic CROME, RN Outcome: Progressing   Problem:  Education: Goal: Understanding of CV disease, CV risk reduction, and recovery process will improve 01/15/2024 0426 by Jori Roderic CROME, RN Outcome: Progressing 01/15/2024 0425 by Jori Roderic CROME, RN Outcome: Progressing 01/15/2024 0424 by Jori Roderic CROME, RN Outcome: Progressing Goal: Individualized Educational Video(s) 01/15/2024 0426 by Jori Roderic CROME, RN Outcome: Progressing 01/15/2024 0425 by Jori Roderic CROME, RN Outcome: Progressing 01/15/2024 0424 by Jori Roderic CROME, RN Outcome: Progressing   Problem: Activity: Goal: Ability to return to baseline activity level will improve 01/15/2024 0426 by Jori Roderic CROME, RN Outcome: Progressing 01/15/2024 0425 by Jori Roderic CROME, RN Outcome: Progressing 01/15/2024 0424 by Jori Roderic CROME, RN Outcome: Progressing   Problem: Cardiovascular: Goal: Ability to achieve and maintain adequate cardiovascular perfusion will improve  01/15/2024 0426 by Jori Roderic CROME, RN Outcome: Progressing 01/15/2024 0425 by Jori Roderic CROME, RN Outcome: Progressing 01/15/2024 0424 by Jori Roderic CROME, RN Outcome: Progressing Goal: Vascular access site(s) Level 0-1 will be maintained 01/15/2024 0426 by Jori Roderic CROME, RN Outcome: Progressing 01/15/2024 0425 by Jori Roderic CROME, RN Outcome: Progressing 01/15/2024 0424 by Jori Roderic CROME, RN Outcome: Progressing   Problem: Health Behavior/Discharge Planning: Goal: Ability to safely manage health-related needs after discharge will improve 01/15/2024 0426 by Jori Roderic CROME, RN Outcome: Progressing 01/15/2024 0425 by Jori Roderic CROME, RN Outcome: Progressing 01/15/2024 0424 by Jori Roderic CROME, RN Outcome: Progressing

## 2024-01-15 NOTE — Progress Notes (Signed)
 Physical Therapy Treatment Patient Details Name: Brent Ray. MRN: [id number] DOB: 01/14/1946 Today's Date: 01/15/2024   History of Present Illness Pt is a 78 y.o. male who presented 01/12/24 with L-sided weakness and slurred speech. Not a candidate for TNK. 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 S/p successful thrombectomy 11/1. Head CT negative. PMH: CHF, a-fib, AICD, Barrett's esophagus, CKD, HTN, GERD, HLD, NSVT, RA, skin cancer, sleep apnea, heart block AV first degree    PT Comments  Pt received in supine and agreeable to session. Pt demonstrates good activity tolerance and is able to perform gait and stair trials with up to CGA for safety. Pt able to ambulate with no AD without LOB, but demonstrates improved stability with RW support. Pt agreeable to use of RW at home for reduced fall risk. Pt able to complete serial STS, but requires UE support for power up. Education on activity progression with pt and pt's wife verbalizing understanding. Pt continues to benefit from PT services to progress toward functional mobility goals.     If plan is discharge home, recommend the following: A little help with walking and/or transfers;A little help with bathing/dressing/bathroom;Assistance with cooking/housework;Assist for transportation;Help with stairs or ramp for entrance   Can travel by private vehicle        Equipment Recommendations  None recommended by PT    Recommendations for Other Services       Precautions / Restrictions Precautions Precautions: Fall Precaution/Restrictions Comments: SBP < 160 Restrictions Weight Bearing Restrictions Per Provider Order: No     Mobility  Bed Mobility Overal bed mobility: Needs Assistance Bed Mobility: Supine to Sit, Sit to Supine     Supine to sit: Contact guard, HOB elevated Sit to supine: HOB elevated, Contact guard assist   General bed mobility comments: increased time and effort, but no physical assist    Transfers Overall transfer  level: Needs assistance Equipment used: Rolling walker (2 wheels) Transfers: Sit to/from Stand Sit to Stand: Modified independent (Device/Increase time)                Ambulation/Gait Ambulation/Gait assistance: Contact guard assist, Supervision Gait Distance (Feet): 250 Feet Assistive device: Rolling walker (2 wheels), None Gait Pattern/deviations: Step-through pattern, Decreased step length - right, Decreased step length - left, Decreased dorsiflexion - left, Decreased stride length Gait velocity: reduced     General Gait Details: Pt demonstrates short steps with low foot clearance. increased effort to advance LLE. improved stability with RW support, but no LOB without. cues for upright posture   Stairs Stairs: Yes Stairs assistance: Contact guard assist Stair Management: Two rails, Alternating pattern Number of Stairs: 2 General stair comments: Reliance on rails for balance and CGA for safety   Wheelchair Mobility     Tilt Bed    Modified Rankin (Stroke Patients Only) Modified Rankin (Stroke Patients Only) Pre-Morbid Rankin Score: No symptoms Modified Rankin: Moderately severe disability     Balance Overall balance assessment: Mild deficits observed, not formally tested                                          Communication Communication Communication: No apparent difficulties  Cognition Arousal: Alert Behavior During Therapy: WFL for tasks assessed/performed   PT - Cognitive impairments: No apparent impairments  Following commands: Intact      Cueing Cueing Techniques: Verbal cues, Tactile cues  Exercises Other Exercises Other Exercises: x5 serial STS    General Comments        Pertinent Vitals/Pain Pain Assessment Pain Assessment: No/denies pain     PT Goals (current goals can now be found in the care plan section) Acute Rehab PT Goals Patient Stated Goal: to get back to his baseline;  return to OPPT PT Goal Formulation: With patient/family Time For Goal Achievement: 01/27/24 Progress towards PT goals: Progressing toward goals    Frequency    Min 2X/week       AM-PAC PT 6 Clicks Mobility   Outcome Measure  Help needed turning from your back to your side while in a flat bed without using bedrails?: A Little Help needed moving from lying on your back to sitting on the side of a flat bed without using bedrails?: A Little Help needed moving to and from a bed to a chair (including a wheelchair)?: A Little Help needed standing up from a chair using your arms (e.g., wheelchair or bedside chair)?: A Little Help needed to walk in hospital room?: A Little Help needed climbing 3-5 steps with a railing? : A Little 6 Click Score: 18    End of Session Equipment Utilized During Treatment: Gait belt Activity Tolerance: Patient tolerated treatment well Patient left: in bed;with call bell/phone within reach;with family/visitor present Nurse Communication: Mobility status PT Visit Diagnosis: Unsteadiness on feet (R26.81);Other abnormalities of gait and mobility (R26.89);Muscle weakness (generalized) (M62.81);Difficulty in walking, not elsewhere classified (R26.2)     Time: 9094-9072 PT Time Calculation (min) (ACUTE ONLY): 22 min  Charges:    $Gait Training: 8-22 mins PT General Charges $$ ACUTE PT VISIT: 1 Visit                     Darryle George, PTA Acute Rehabilitation Services Secure Chat Preferred  [phone]    Darryle George 01/15/2024, 11:54 AM

## 2024-01-15 NOTE — TOC Transition Note (Signed)
 Transition of Care [facility]) - Discharge Note   Patient Details  Name: Brent Ray. MRN: [id number] Date of Birth: [birth year]  Transition [facility] CM/SW Contact:  Andrez JULIANNA George, RN Phone Number: 01/15/2024, 2:18 PM   Clinical Narrative:     Pt is discharging home with home health through [facility]. Information on the AVS.  Pt has transportation home.  Final next level of care: Home w Home Health Services Barriers to Discharge: No Barriers Identified   Patient Goals and CMS Choice   CMS Medicare.gov Compare Post Acute Care list provided to:: Patient Choice offered to / list presented to : Patient, Spouse      Discharge Placement                       Discharge Plan and Services Additional resources added to the After Visit Summary for     Discharge Planning Services: CM Consult Post Acute Care Choice: Home Health                    HH Arranged: PT [facility] Agency: [facility] Date [facility] Agency Contacted: 01/15/24   Representative spoke with at [facility] Agency: Jon  Social Drivers of Health (SDOH) Interventions SDOH Screenings   Food Insecurity: No Food Insecurity (01/13/2024)  Housing: Low Risk  (01/13/2024)  Transportation Needs: No Transportation Needs (01/13/2024)  Utilities: Not At Risk (01/13/2024)  Financial Resource Strain: Low Risk  (07/01/2021)   Received from [facility]  Social Connections: Moderately Integrated (01/13/2024)  Tobacco Use: Low Risk  (01/12/2024)     Readmission Risk Interventions    01/04/2024    2:30 PM  Readmission Risk Prevention Plan  Transportation Screening Complete  PCP or Specialist Appt within 3-5 Days Not Complete  HRI or Home Care Consult Complete  Social Work Consult for Recovery Care Planning/Counseling Complete  Palliative Care Screening Not Applicable  Medication Review Ocean[employer]) Complete

## 2024-01-15 NOTE — Progress Notes (Signed)
 SLP Cancellation Note  Patient Details Name: Brent Ray. MRN: [id number] DOB: 07/15/1945   Cancelled treatment:       Reason Eval/Treat Not Completed: SLP screened, no needs identified, will sign off  [facility] MA, CCC-SLP  [name] 01/15/2024, 8:40 AM

## 2024-01-15 NOTE — Telephone Encounter (Signed)
Pharmacy calling to check status of refill

## 2024-01-15 NOTE — Discharge Summary (Addendum)
 Stroke Discharge Summary  Patient ID: Brent Ray   MRN: [id number]      DOB: 1945-06-03  Date of Admission: 01/12/2024 Date of Discharge: 01/15/2024  Attending Physician:  Stroke, Md, MD, Stroke MD Consultant(s):    Dr. Ray, Neuro IR Patient's PCP:  Meade Bigness, MD  DISCHARGE DIAGNOSIS: Acute Ischemic Infarct:  right MCA s/p mechanical thrombectomy of right M2 occlusion with complete revascularization TICI 3 Etiology: Cardioembolic in the setting of A-fib off of his anticoagulation due to recent GI bleed Principal Problem:   Stroke (cerebrum) (HCC) Left hemiparesis Atrial Fibrillation Congestive Heart Failure Cardiomyopathy CAD  Allergies as of 01/15/2024       Reactions   Flecainide  Other (See Comments)   Severe headaches, blurred vision   Flomax [tamsulosin] Hives, Itching, Dermatitis, Rash   Pacerone [amiodarone] Other (See Comments)   Blindness/vision changes Arthralgias    Aldactone  [spironolactone ] Swelling, Other (See Comments)   Breast swelling Headaches   Coreg  [carvedilol ] Other (See Comments)   Unknown reaction   Entresto [sacubitril-valsartan] Nausea And Vomiting   Farxiga [dapagliflozin] Diarrhea   Inspra [eplerenone] Other (See Comments)   Unknown reaction   Lopressor  [metoprolol ] Other (See Comments)   Headaches Dizziness    Zestril  [lisinopril ] Nausea And Vomiting   Ferosul [ferrous Sulfate] Other (See Comments)   Bleeding hemorrhoids secondary to severe constipation   Azulfidine [sulfasalazine] Nausea Only   Statins Other (See Comments)   Myalgias         Medication List     TAKE these medications    acetaminophen  500 MG tablet Commonly known as: TYLENOL  Take 1,000 mg by mouth 2 (two) times daily as needed for fever or headache (pain).   apixaban  2.5 MG Tabs tablet Commonly known as: ELIQUIS  Take 1 tablet (2.5 mg total) by mouth 2 (two) times daily.   bisacodyl 10 MG suppository Commonly known as: DULCOLAX Place 10 mg  rectally daily as needed for severe constipation.   folic acid  1 MG tablet Commonly known as: FOLVITE  Take 1 mg by mouth See admin instructions. Take 1 tablet (1mg ) by mouth daily, 6 days a week. Do not take folic acid  on Friday.   furosemide  20 MG tablet Commonly known as: LASIX  Take 1 tablet (20 mg total) by mouth 2 (two) times daily. What changed: when to take this   hydrocortisone  2.5 % rectal cream Commonly known as: ANUSOL -HC Place 1 Application rectally 2 (two) times daily.   losartan  25 MG tablet Commonly known as: COZAAR  Take 0.5 tablets (12.5 mg total) by mouth at bedtime. What changed: when to take this   methotrexate  2.5 MG tablet Commonly known as: RHEUMATREX Take 30 mg by mouth every Friday.   mexiletine 150 MG capsule Commonly known as: MEXITIL  Take 150 mg by mouth 2 (two) times daily.   Nitrostat  0.4 MG SL tablet Generic drug: nitroGLYCERIN  DISSOLVE 1 TABLET UNDER TONGUE EVERY 5 MINUTES AS NEEDED FOR CHEST PAIN What changed: See the new instructions.   NONFORMULARY OR COMPOUNDED ITEM Use as directed 5 mLs in the mouth or throat 4 (four) times daily as needed (mouth pain). Pharmacy compounded Magic Mouthwash:  unknown quantities of nystatin/hydrocortisone /lidocaine /diphenhydramine  Swish and spit 5mL up to 4 times daily as needed for mouth pain.   pantoprazole  40 MG tablet Commonly known as: PROTONIX  Take 1 tablet (40 mg total) by mouth 2 (two) times daily before a meal. What changed:  when to take this reasons to take this  potassium chloride  SA 20 MEQ tablet Commonly known as: KLOR-CON  M Take 1 tablet (20 mEq total) by mouth 2 (two) times daily. What changed: when to take this   senna-docusate 8.6-50 MG tablet Commonly known as: Senokot-S Take 2 tablets by mouth at bedtime. What changed:  when to take this reasons to take this   sodium bicarbonate  650 MG tablet Take 650 mg by mouth daily.   sucralfate 1 GM/10ML suspension Commonly known as:  CARAFATE Take 10 mLs (1 g total) by mouth 3 (three) times daily for 5 days.   traMADol 50 MG tablet Commonly known as: ULTRAM Take 50 mg by mouth every 6 (six) hours as needed for moderate pain (pain score 4-6).        LABORATORY STUDIES CBC    Component Value Date/Time   WBC 3.5 (L) 01/15/2024 0247   RBC 3.03 (L) 01/15/2024 0247   HGB 9.8 (L) 01/15/2024 0247   HCT 30.7 (L) 01/15/2024 0247   PLT 486 (H) 01/15/2024 0247   MCV 101.3 (H) 01/15/2024 0247   MCH 32.3 01/15/2024 0247   MCHC 31.9 01/15/2024 0247   RDW 16.9 (H) 01/15/2024 0247   LYMPHSABS 0.5 (L) 01/12/2024 1848   MONOABS 0.7 01/12/2024 1848   EOSABS 0.1 01/12/2024 1848   BASOSABS 0.0 01/12/2024 1848   CMP    Component Value Date/Time   NA 139 01/14/2024 0549   NA 139 03/30/2023 1026   K 4.9 01/14/2024 0549   CL 112 (H) 01/14/2024 0549   CO2 18 (L) 01/14/2024 0549   GLUCOSE 106 (H) 01/14/2024 0549   BUN 21 01/14/2024 0549   BUN 28 (H) 03/30/2023 1026   CREATININE 1.65 (H) 01/14/2024 0549   CREATININE 1.33 (H) 01/24/2016 1336   CALCIUM  8.6 (L) 01/14/2024 0549   PROT 6.7 01/12/2024 1848   ALBUMIN  4.0 01/12/2024 1848   AST 31 01/12/2024 1848   ALT 26 01/12/2024 1848   ALKPHOS 95 01/12/2024 1848   BILITOT 0.6 01/12/2024 1848   GFRNONAA 43 (L) 01/14/2024 0549   GFRAA >60 04/05/2016 1120   COAGS Lab Results  Component Value Date   INR 1.1 01/12/2024   INR 1.1 08/27/2023   INR 1.1 08/01/2023   Lipid Panel    Component Value Date/Time   CHOL 134 01/13/2024 0855   TRIG 62 01/13/2024 0855   HDL 51 01/13/2024 0855   CHOLHDL 2.6 01/13/2024 0855   VLDL 12 01/13/2024 0855   LDLCALC 71 01/13/2024 0855   HgbA1C  Lab Results  Component Value Date   HGBA1C 5.6 08/21/2023   Urinalysis No results found for: COLORURINE, APPEARANCEUR, LABSPEC, PHURINE, GLUCOSEU, HGBUR, BILIRUBINUR, KETONESUR, PROTEINUR, UROBILINOGEN, NITRITE, LEUKOCYTESUR Urine Drug Screen     Component Value  Date/Time   LABOPIA NEGATIVE 01/12/2024 1809   COCAINSCRNUR NEGATIVE 01/12/2024 1809   LABBENZ NEGATIVE 01/12/2024 1809   AMPHETMU NEGATIVE 01/12/2024 1809   THCU NEGATIVE 01/12/2024 1809   LABBARB NEGATIVE 01/12/2024 1809    Alcohol  Level    Component Value Date/Time   ETH <15 01/12/2024 1848     SIGNIFICANT DIAGNOSTIC STUDIES    EXAM: CT HEAD WITHOUT CONTRAST 01/12/2024 06:15:25 PM FINDINGS: BRAIN AND VENTRICLES: Dilated perivascular space of the right lentiform nucleus. Chronic ischemic white matter changes. No acute hemorrhage. No evidence of acute infarct. No hydrocephalus. No extra-axial collection. No mass effect or midline shift. ORBITS: No acute abnormality. SINUSES: No acute abnormality. SOFT TISSUES AND SKULL: No acute soft tissue abnormality. No skull fracture. alberta stroke  program early CT score (ASPECTS) Ganglionic (caudate, IC, lentiform nucleus, insula, M1-M3):7  Supraganglionic (M4-M6):3 Total:10 IMPRESSION: 1. No acute intracranial abnormality. 2. ASPECTS: 10. Findings communicated to Dr. Germaine via [facility] text page at 6;23 PM on 01/12/2024.    EXAM: CTA Head and Neck with Intravenous Contrast. CT Head without Contrast. ADDENDUM: Findings also discussed with Dr. Suzette via telephone at 6:57 PM on 01/12/2024. FINDINGS: CT HEAD: BRAIN:No acute intraparenchymal hemorrhage. No mass lesion. No CT evidence for acute territorial infarct. No midline shift or extra-axial collection. VENTRICLES:No hydrocephalus. ORBITS:The orbits are unremarkable. SINUSES AND MASTOIDS:The paranasal sinuses and mastoid air cells are clear. CTA NECK: COMMON CAROTID ARTERIES: Right: Atherosclerotic calcification at the bifurcation. No hemodynamically significant stenosis. No dissection or occlusion. Left:Atherosclerosis at the bifurcation. No hemodynamically significant stenosis. No dissection or occlusion. INTERNAL CAROTID ARTERIES: Right: Atherosclerotic  calcification extending into the proximal segment. No hemodynamically significant stenosis by NASCET criteria. No dissection or occlusion. Left:Atherosclerosis extending into the proximal segment. No hemodynamically significant stenosis by NASCET criteria. No dissection or occlusion. VERTEBRAL ARTERIES: No significant stenosis. No dissection or occlusion. CTA HEAD: ANTERIOR CEREBRAL ARTERIES: Azygos configuration of the anterior cerebral arteries. No significant stenosis. No occlusion. No aneurysm. MIDDLE CEREBRAL ARTERIES: Right: Occlusion of a distal M2 branch in the posterior sylvian fissure (series 7, image 92). Left:Normal.No aneurysm. POSTERIOR CEREBRAL ARTERIES: Left: Multifocal narrowing of the P2 segment, but patency is maintained. Right:Normal.No aneurysm. BASILAR ARTERY:No significant stenosis. No occlusion. No aneurysm. OTHER:Aortic atherosclerosis. SOFT TISSUES:No acute finding. No masses or lymphadenopathy. BONES:No acute osseous abnormality. IMPRESSION: 1. Occlusion of a right MCA distal M2 branch in the posterior sylvian fissure. 2. Multifocal narrowing of the left PCA P2 segment with maintained patency. 3. Atherosclerotic calcification at the right carotid bifurcation extending into the proximal internal and external carotid arteries without hemodynamically significant stenosis. 4. Left carotid bifurcation atherosclerosis without hemodynamically significant stenosis.   Findings communicated to Dr. Germaine via [facility] text page at 6:30 pm on 01/12/2024.  EXAM: CT HEAD WITHOUT CONTRAST 01/13/2024 11:43:19 AM FINDINGS: BRAIN AND VENTRICLES: There is no evidence of an acute infarct, intracranial hemorrhage, mass,midline shift, hydrocephalus, or extra-axial fluid collection. There is mild cerebral atrophy. Patchy cerebral white matter hypodensities are unchanged and nonspecific but compatible with moderate chronic small vessel ischemic disease. A small chronic right  cerebellar infarct is again noted. A dilated perivascular space is again noted in the right basal ganglia. There is mild residual hyperdensity of a right MCA branch vessel in the sylvian fissure corresponding to the previously shown distal M2 occlusion status post interval thrombectomy. Calcified atherosclerosis at the skull base. ORBITS:Bilateral cataract extraction. SINUSES:No acute abnormality. SOFT TISSUES AND SKULL: No acute soft tissue abnormality. No skull fracture. IMPRESSION: 1. No acute intracranial abnormality. 2. Moderate chronic small vessel ischemic disease.  EXAM: MRI BRAIN WITHOUT CONTRAST 01/14/2024 09:41:26 AM FINDINGS: BRAIN AND VENTRICLES: Minimal mildly reduced effusion along the right insula. New punctate focus of susceptibility along the right caudate likely reflects a chronic microhemorrhage. Unchanged probable chronic microhemorrhage within the cerebellum. Small chronic right cerebellar infarct. Patchy T2 hyperintensity in the supratentorial white matter reflecting stable chronic microvascular ischemic changes. Prominence of the ventricles and sulci reflecting stable parenchymal volume loss. No mass. No midline shift. No hydrocephalus. The sella is unremarkable. Normal flow voids. ORBITS: Bilateral lens replacements. No acute abnormality. SINUSES AND MASTOIDS: No acute abnormality. BONES AND SOFT TISSUES: Normal marrow signal. No acute soft tissue abnormality. IMPRESSION: 1. Small acute right insular infarct.  HISTORY OF PRESENT ILLNESS 78 y.o. male with history of A-fib on Eliquis , Eliquis  recently stopped due to rectal bleeding s/p PPM, CHF, CKD, CAD, cardiomyopathy, history of CVA hypertension, GERD, hyperlipidemia, rheumatoid arthritis, sleep apnea, presented with left-sided weakness and slurred speech NIH on Admission 14. CTA showed right M2 occlusion and did not receive TNK due to recent GI hemorrhage 10 days ago but underwent successful mechanical  thrombectomy with excellent clinical outcome. CT head showed no hemorrhage or large stroke.   HOSPITAL COURSE Acute Ischemic Infarct:  right MCA s/p mechanical thrombectomy of right M2 occlusion with complete revascularization TICI 3 Etiology: Cardioembolic in the setting of A-fib off of his anticoagulation due to recent GI bleed Code Stroke  CT head No acute abnormality. ASPECTS 10.    CTA head & neck 1. Occlusion of a right MCA distal M2 branch in the posterior sylvian fissure. 2. Multifocal narrowing of the left PCA P2 segment with maintained patency. 3. Atherosclerotic calcification at the right carotid bifurcation extending into the proximal internal and external carotid arteries without hemodynamically significant stenosis. 4. Left carotid bifurcation atherosclerosis without hemodynamically significant stenosis. MRI: Small acute right insular infarct.  2D Echo 01/03/2024 EF 55%.  Left atrium and right atrium severely dilated.  Moderate to severe mitral valve regurgitation.  Mild prolapse of posterior leaflet of mitral valve Known Afib diagnosis LDL 54 HgbA1c 5.6 VTE prophylaxis -SCDs No antithrombotic prior to admission due to Eliquis  being on hold. Eliquis  restarted, continue on discharge.  Therapy recommendations: [facility] will contact patient for first time visit. Information on AVS paperwork given to patient at discharge Disposition: Home 11/4   Atrial fibrillation Home Meds: Eliquis  however been on hold since 01/06/2024 in setting of recent GI bleed. Restarted 11/3. Continue Eliquis  Follow-up with Outpatient Cardiology regarding Watchman device plan    Hypertension CHF CAD Cardiomyopathy Home meds: Lasix  20 mg, losartan  12.5 mg Stable Blood Pressure Goal: Normotensive  Hyperlipidemia Home meds: None, resumed in hospital LDL 54, goal < 70 High intensity statin not indicated due to below goal  Other Stroke Risk Factors Obstructive sleep apnea, on CPAP  at home  Other Active Problems CKD-creatinine 1.85  DISCHARGE EXAM Blood pressure 127/65, pulse 75, temperature 97.6 F (36.4 C), temperature source Oral, resp. rate 17, height 5' 8 (1.727 m), weight 54.3 kg, SpO2 100%. PHYSICAL EXAM General:  Alert, well-nourished, well-developed patient in no acute distress Psych:  Mood and affect appropriate for situation CV: Regular rate and rhythm on monitor Respiratory:  Regular, unlabored respirations on room air GI: Abdomen soft and nontender   NEURO:  Mental Status: AA&Ox3, patient is able to give clear and coherent history Speech/Language: speech is without aphasia.  Naming, repetition, fluency, and comprehension intact.   Cranial Nerves:  II: PERRL. Visual fields full.  III, IV, VI: EOMI. Eyelids elevate symmetrically.  V: Sensation is intact to light touch and symmetrical to face.  VII: Face is symmetrical resting and smiling VIII: hearing intact to voice. IX, X: Palate elevates symmetrically. No dysarthria.  KP:Dynloizm shrug 5/5. XII: tongue is midline without fasciculations. Motor: 5/5 strength to all muscle groups tested.  Tone: is normal and bulk is normal Sensation- Intact to light touch bilaterally. Extinction absent to light touch to DSS.   Coordination: FTN intact bilaterally, HKS: no ataxia in BLE.No drift.  Gait- deferred   Most Recent NIH 0  Discharge Diet       Diet   Diet regular Room service appropriate? Yes; Fluid  consistency: Thin   liquids  DISCHARGE PLAN Disposition:  home with home health PT Eliquis  2.5 mg twice daily for secondary stroke prevention Ongoing stroke risk factor control by Primary Care Physician at time of discharge Follow-up PCP Meade Bigness, MD next Wednesday.  CBC testing at PCP office this Friday Follow-up in [facility] in 8 weeks, office to schedule an appointment.  Follow-up with Heart Failure Clinic and for EPS for Watchman consideration.   35  minutes were spent preparing discharge.  Rocky JAYSON Likes, DNP [facility]   I have personally obtained history,examined this patient, reviewed notes, independently viewed imaging studies, participated in medical decision making and plan of care.ROS completed by me personally and pertinent positives fully documented  I have made any additions or clarifications directly to the above note. Agree with note above.   Eather Popp, MD Medical Director [facility] Pager: [phone] 01/15/2024 1:40 PM

## 2024-01-15 NOTE — Plan of Care (Signed)
  Problem: Education: Goal: Knowledge of disease or condition will improve Outcome: Progressing Goal: Knowledge of secondary prevention will improve (MUST DOCUMENT ALL) Outcome: Progressing Goal: Knowledge of patient specific risk factors will improve (DELETE if not current risk factor) Outcome: Progressing   Problem: Ischemic Stroke/TIA Tissue Perfusion: Goal: Complications of ischemic stroke/TIA will be minimized Outcome: Progressing   Problem: Coping: Goal: Will verbalize positive feelings about self Outcome: Progressing Goal: Will identify appropriate support needs Outcome: Progressing   Problem: Health Behavior/Discharge Planning: Goal: Ability to manage health-related needs will improve Outcome: Progressing Goal: Goals will be collaboratively established with patient/family Outcome: Progressing   Problem: Self-Care: Goal: Ability to participate in self-care as condition permits will improve Outcome: Progressing Goal: Verbalization of feelings and concerns over difficulty with self-care will improve Outcome: Progressing Goal: Ability to communicate needs accurately will improve Outcome: Progressing   Problem: Nutrition: Goal: Risk of aspiration will decrease Outcome: Progressing Goal: Dietary intake will improve Outcome: Progressing   Problem: Education: Goal: Knowledge of General Education information will improve Description: Including pain rating scale, medication(s)/side effects and non-pharmacologic comfort measures Outcome: Progressing   Problem: Health Behavior/Discharge Planning: Goal: Ability to manage health-related needs will improve Outcome: Progressing   Problem: Clinical Measurements: Goal: Ability to maintain clinical measurements within normal limits will improve Outcome: Progressing Goal: Will remain free from infection Outcome: Progressing Goal: Diagnostic test results will improve Outcome: Progressing Goal: Respiratory complications will  improve Outcome: Progressing Goal: Cardiovascular complication will be avoided Outcome: Progressing   Problem: Activity: Goal: Risk for activity intolerance will decrease Outcome: Progressing   Problem: Nutrition: Goal: Adequate nutrition will be maintained Outcome: Progressing   Problem: Coping: Goal: Level of anxiety will decrease Outcome: Progressing   Problem: Elimination: Goal: Will not experience complications related to bowel motility Outcome: Progressing Goal: Will not experience complications related to urinary retention Outcome: Progressing   Problem: Pain Managment: Goal: General experience of comfort will improve and/or be controlled Outcome: Progressing   Problem: Safety: Goal: Ability to remain free from injury will improve Outcome: Progressing   Problem: Skin Integrity: Goal: Risk for impaired skin integrity will decrease Outcome: Progressing   Problem: Education: Goal: Understanding of CV disease, CV risk reduction, and recovery process will improve Outcome: Progressing Goal: Individualized Educational Video(s) Outcome: Progressing   Problem: Activity: Goal: Ability to return to baseline activity level will improve Outcome: Progressing   Problem: Cardiovascular: Goal: Ability to achieve and maintain adequate cardiovascular perfusion will improve Outcome: Progressing Goal: Vascular access site(s) Level 0-1 will be maintained Outcome: Progressing   Problem: Health Behavior/Discharge Planning: Goal: Ability to safely manage health-related needs after discharge will improve Outcome: Progressing

## 2024-01-15 NOTE — Care Management Important Message (Signed)
 Important Message  Patient Details  Name: Brent Ray. MRN: [id number] Date of Birth: [birth year]   Important Message Given:  Yes - Medicare IM     Claretta Deed 01/15/2024, 2:59 PM

## 2024-01-15 NOTE — Discharge Summary (Incomplete)
 Reviewed DC summary with patient and wife June. Both stated understanding. Volunteers called to wheel pt of unit but stop by TOC first. CCMD notified of D/C. IV removed.

## 2024-01-16 ENCOUNTER — Telehealth: Payer: Self-pay

## 2024-01-16 MED ORDER — MEXILETINE HCL 150 MG PO CAPS: 150.0000 mg | ORAL_CAPSULE | Freq: Two times a day (BID) | ORAL | 2 refills | Status: AC

## 2024-01-16 NOTE — Telephone Encounter (Signed)
 Spoke with patient's wife to arrange Watchman referral. Wife stated when he had GI scope there was no active bleed.   Note from Dr. Shaaron on 01/05/2024 states, EGD yesterday revealed a markedly raw friable gastric mucosa. no gastric ulcer but he did have gum ulcers. no esophageal varices.  Small biopsies obtained for H. pylori. Since  [place] out of state reportedly negative, we have completed his inpatient evaluation. Recent GI bleed likely multifactorial but arising from his upper GI tract as described.   They wanted him to f/u with Dr. Garlan (primary GI) in 2 weeks. Advised wife to make this follow up. She verbalized understanding. Patient is also seeing his gum doctor tomorrow.   Arranged Watchman consult with Dr. Almetta 02/22/2024. Wife is aware to call back and reschedule this appt if patient cannot get in to see GI prior to 02/22/2024.

## 2024-01-18 ENCOUNTER — Other Ambulatory Visit (HOSPITAL_COMMUNITY): Payer: Self-pay

## 2024-01-18 ENCOUNTER — Other Ambulatory Visit (HOSPITAL_COMMUNITY): Payer: Self-pay | Admitting: Family Medicine

## 2024-01-19 LAB — BASIC METABOLIC PANEL WITH GFR
BUN/Creatinine Ratio: 13 (ref 10–24)
BUN: 27 mg/dL (ref 8–27)
CO2: 20 mmol/L (ref 20–29)
Calcium: 9.5 mg/dL (ref 8.6–10.2)
Chloride: 108 mmol/L — ABNORMAL HIGH (ref 96–106)
Creatinine, Ser: 2.04 mg/dL — ABNORMAL HIGH (ref 0.76–1.27)
Glucose: 77 mg/dL (ref 70–99)
Potassium: 4.5 mmol/L (ref 3.5–5.2)
Sodium: 143 mmol/L (ref 134–144)
eGFR: 33 mL/min/1.73 — ABNORMAL LOW (ref >59)

## 2024-01-21 ENCOUNTER — Ambulatory Visit: Attending: Cardiovascular Disease

## 2024-01-21 ENCOUNTER — Ambulatory Visit (HOSPITAL_COMMUNITY): Payer: Self-pay | Admitting: Family Medicine

## 2024-01-21 DIAGNOSIS — Z95810 Presence of automatic (implantable) cardiac defibrillator: Secondary | ICD-10-CM | POA: Diagnosis not present

## 2024-01-21 DIAGNOSIS — I5032 Chronic diastolic (congestive) heart failure: Secondary | ICD-10-CM | POA: Diagnosis not present

## 2024-01-21 NOTE — Progress Notes (Signed)
  Received: Today [place], Harlene HERO, FNP  Blue Ruggerio, Mitzie RAMAN, RN Thank you, no change in meds for now

## 2024-01-21 NOTE — Progress Notes (Signed)
 EPIC Encounter for ICM Monitoring  Patient Name: Brent Ray. is a 78 y.o. male Date: 01/21/2024 Primary Care Physican: Meade Bigness, MD Primary Cardiologist: Zenaida Electrophysiologist: Mealor Bi-V Pacing: 97.7%        HF clinic check as requested by Harlene Gainer, NP following 01/08/2024 OV.   Hospitalization 01/13/2024-01/15/2024.   Optivol thoracic impedance suggesting possible fluid accumulation starting 12/24/2023 and returned close to baseline 01/21/2024.   Prescribed:  Furosemide  20 mg take 1 tablet(s) (20 mg total) by mouth twice a day. Potassium 20 mEq take 1 tablet(s) (20 mEq total) by mouth twice a day.  Labs: 01/18/2024 Creatinine 2.04, BUN 27, Potassium 4.5, Sodium 143, GFR 33  01/14/2024 Creatinine 1.65, BUN 21, Potassium 4.9, Sodium 139  01/12/2024 Creatinine 2.00, BUN 19, Potassium 3.1, Sodium 141  A complete set of results can be found in Results Review.  Recommendations:  Copy sent to Harlene Gainer, NP as requested following 01/08/2024 office visit.   Follow-up plan: No further ICM clinic phone appointment scheduled.   91 day device clinic remote transmission 02/20/2024.    EP/Cardiology Office Visits:   01/25/2024 with HF clinic post hospital.  02/22/2024 with Dr. Almetta [facility]).    Copy of ICM check sent to Dr. Nancey.    Remote monitoring is medically necessary for Heart Failure Management.    Daily Thoracic Impedance ICM trend: 10/22/2023 through 01/21/2024.    12-14 Month Thoracic Impedance ICM trend:     Mitzie GORMAN Garner, RN 01/21/2024 2:24 PM

## 2024-01-21 NOTE — Telephone Encounter (Signed)
Pt aware via wife 

## 2024-01-22 ENCOUNTER — Other Ambulatory Visit (HOSPITAL_COMMUNITY): Payer: Self-pay

## 2024-01-22 ENCOUNTER — Encounter (HOSPITAL_COMMUNITY): Payer: Self-pay

## 2024-01-22 ENCOUNTER — Telehealth: Payer: Self-pay

## 2024-01-22 NOTE — Telephone Encounter (Signed)
 Pharmacy Patient Advocate Encounter   Received notification from Fax that prior authorization for MEXILETINE is required/requested.   Insurance verification completed.   The patient is insured through [facility].   Per test claim: PA required; PA submitted to above mentioned insurance via Latent Key/confirmation #/EOC [facility] Status is pending

## 2024-01-22 NOTE — Progress Notes (Unsigned)
 Lab work received  Potassium 4.5  BUN/Creat 27/2.04  Reviewed by JINNY Gainer NP  Lab work to be rechecked @ 11/14 appointment

## 2024-01-22 NOTE — Telephone Encounter (Addendum)
 PLAN REQUIRES ADDITIONAL INFO FOR APPROVAL. ADDITIONAL INFO HAS BEEN FAXED TO PLAN. REDETERMINATION STATUS IS PENDING.

## 2024-01-24 ENCOUNTER — Other Ambulatory Visit (HOSPITAL_COMMUNITY): Payer: Self-pay | Admitting: Family Medicine

## 2024-01-25 ENCOUNTER — Telehealth (HOSPITAL_COMMUNITY): Payer: Self-pay

## 2024-01-25 ENCOUNTER — Ambulatory Visit (HOSPITAL_COMMUNITY)
Admit: 2024-01-25 | Discharge: 2024-01-25 | Disposition: A | Discharge status: Home / Self Care | Source: Ambulatory Visit | Attending: Family Medicine | Admitting: Family Medicine

## 2024-01-25 ENCOUNTER — Ambulatory Visit (HOSPITAL_COMMUNITY): Payer: Self-pay | Admitting: Family Medicine

## 2024-01-25 ENCOUNTER — Encounter (HOSPITAL_COMMUNITY): Payer: Self-pay

## 2024-01-25 VITALS — BP 130/74 | HR 71 | Wt 113.4 lb

## 2024-01-25 DIAGNOSIS — Z7982 Long term (current) use of aspirin: Secondary | ICD-10-CM | POA: Insufficient documentation

## 2024-01-25 DIAGNOSIS — K922 Gastrointestinal hemorrhage, unspecified: Secondary | ICD-10-CM | POA: Insufficient documentation

## 2024-01-25 DIAGNOSIS — Z95810 Presence of automatic (implantable) cardiac defibrillator: Secondary | ICD-10-CM | POA: Diagnosis not present

## 2024-01-25 DIAGNOSIS — I2510 Atherosclerotic heart disease of native coronary artery without angina pectoris: Secondary | ICD-10-CM | POA: Diagnosis present

## 2024-01-25 DIAGNOSIS — Z7901 Long term (current) use of anticoagulants: Secondary | ICD-10-CM | POA: Diagnosis not present

## 2024-01-25 DIAGNOSIS — I4821 Permanent atrial fibrillation: Secondary | ICD-10-CM | POA: Insufficient documentation

## 2024-01-25 DIAGNOSIS — I639 Cerebral infarction, unspecified: Secondary | ICD-10-CM

## 2024-01-25 DIAGNOSIS — I5020 Unspecified systolic (congestive) heart failure: Secondary | ICD-10-CM | POA: Diagnosis not present

## 2024-01-25 DIAGNOSIS — Z8719 Personal history of other diseases of the digestive system: Secondary | ICD-10-CM

## 2024-01-25 DIAGNOSIS — Z8673 Personal history of transient ischemic attack (TIA), and cerebral infarction without residual deficits: Secondary | ICD-10-CM | POA: Diagnosis not present

## 2024-01-25 DIAGNOSIS — I5022 Chronic systolic (congestive) heart failure: Secondary | ICD-10-CM | POA: Insufficient documentation

## 2024-01-25 DIAGNOSIS — E876 Hypokalemia: Secondary | ICD-10-CM

## 2024-01-25 DIAGNOSIS — N1831 Chronic kidney disease, stage 3a: Secondary | ICD-10-CM

## 2024-01-25 DIAGNOSIS — N189 Chronic kidney disease, unspecified: Secondary | ICD-10-CM | POA: Insufficient documentation

## 2024-01-25 DIAGNOSIS — I340 Nonrheumatic mitral (valve) insufficiency: Secondary | ICD-10-CM | POA: Diagnosis not present

## 2024-01-25 DIAGNOSIS — I428 Other cardiomyopathies: Secondary | ICD-10-CM | POA: Insufficient documentation

## 2024-01-25 DIAGNOSIS — Z01818 Encounter for other preprocedural examination: Secondary | ICD-10-CM

## 2024-01-25 LAB — CBC
HCT: 32.8 % — ABNORMAL LOW (ref 39.0–52.0)
Hemoglobin: 10.6 g/dL — ABNORMAL LOW (ref 13.0–17.0)
MCH: 32.6 pg (ref 26.0–34.0)
MCHC: 32.3 g/dL (ref 30.0–36.0)
MCV: 100.9 fL — ABNORMAL HIGH (ref 80.0–100.0)
Platelets: 217 K/uL (ref 150–400)
RBC: 3.25 MIL/uL — ABNORMAL LOW (ref 4.22–5.81)
RDW: 16 % — ABNORMAL HIGH (ref 11.5–15.5)
WBC: 4.1 K/uL (ref 4.0–10.5)
nRBC: 0 % (ref 0.0–0.2)

## 2024-01-25 LAB — BASIC METABOLIC PANEL WITH GFR
Anion gap: 11 (ref 5–15)
BUN: 32 mg/dL — ABNORMAL HIGH (ref 8–23)
CO2: 18 mmol/L — ABNORMAL LOW (ref 22–32)
Calcium: 8.9 mg/dL (ref 8.9–10.3)
Chloride: 109 mmol/L (ref 98–111)
Creatinine, Ser: 1.96 mg/dL — ABNORMAL HIGH (ref 0.61–1.24)
GFR, Estimated: 35 mL/min — ABNORMAL LOW (ref >60)
Glucose, Bld: 90 mg/dL (ref 70–99)
Potassium: 3.4 mmol/L — ABNORMAL LOW (ref 3.5–5.1)
Sodium: 138 mmol/L (ref 135–145)

## 2024-01-25 NOTE — Telephone Encounter (Signed)
 Pharmacy Patient Advocate Encounter  Received notification from [facility] that Prior Authorization for MEXILETINE has been APPROVED from 01/18/24 to UNTIL FURTHER NOTICE

## 2024-01-25 NOTE — Progress Notes (Signed)
 Patient brought in clearance form for EGD from gastroenterology.Form faxed to [phone]. Confirmation received.

## 2024-01-25 NOTE — Patient Instructions (Addendum)
 Good to see you today!   Labs done today, your results will be available in MyChart, we will contact you for abnormal readings.  Your physician recommends that you schedule a follow-up appointment 6 months(May ) Call office in march to schedule an appointment  If you have any questions or concerns before your next appointment please send us  a message through [facility] or call our office at [phone].    TO LEAVE A MESSAGE FOR THE NURSE SELECT OPTION 2, PLEASE LEAVE A MESSAGE INCLUDING: YOUR NAME DATE OF BIRTH CALL BACK NUMBER REASON FOR CALL**this is important as we prioritize the call backs  YOU WILL RECEIVE A CALL BACK THE SAME DAY AS LONG AS YOU CALL BEFORE 4:00 PM At the [facility], you and your health needs are our priority. As part of our continuing mission to provide you with exceptional heart care, we have created designated Provider Care Teams. These Care Teams include your primary Cardiologist (physician) and Advanced Practice Providers (APPs- Physician Assistants and Nurse Practitioners) who all work together to provide you with the care you need, when you need it.   You may see any of the following providers on your designated Care Team at your next follow up: Dr Toribio Fuel Dr Ezra Shuck Dr. Morene Brownie Greig Mosses, NP Caffie Shed, [place] [facility] [place], [place] Beckey Coe, NP Jordan Lee, NP Ellouise Class, NP Tinnie Redman, PharmD Jaun Bash, PharmD   Please be sure to bring in all your medications bottles to every appointment.    Thank you for choosing [facility]

## 2024-01-25 NOTE — Progress Notes (Signed)
 [facility]   Primary Care: Meade Bigness, MD Nephrology: Dr. Watt ([facility]) EP: Dr. Nancey Primary Cardiologist: Dr. Stacia HF Cardiologist: assign to Dr. Zenaida  HPI: Brent Ray. is a 78 y.o. 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    Unfortunately he reports intolerance to almost every class of GDMT available. During previous HF visit he agreed to start trying losartan  due to a rapid decline in functional status. Unfortunately on the way home from clinic he had a stroke leading to a week long hospitalization (left ICA occlusion).   Admitted 10/25 with CP. HsTroponin flat. CTA chest showed small pericardial effusion. Echo showed EF 55%, RV normal, moderate to severe MR. Cards consulted, planned for [facility] but ultimately cancelled due to rapidly declining hgb, down to 7. Felt CP 2/2 to anemia. AC held and patient underwent upper GI endoscopy showing markedly raw friable gastric mucosa, no gastric ulcer but he did have gum ulcers. Received PRBCs & IV iron. Remained off AC until GI follow up and discharged home, weight 113 lbs.   Unfortunately admitted 11/25 with new CVA, found to have R M2 occlusion and taken for emergent thrombectomy. Felt to be embolus 2/2 to AF. Started on heparin  gtt and ASA. Heparin  eventually stopped and Eliquis  2.5 mg bid resumed. Referred for outpatient Watchman.  Today he returns for post hospital HF follow up with wife. Overall feeling fine. He is not SOB with activity, admits to not being very physically active. Denies palpitations, abnormal bleeding, CP, dizziness, edema, or PND/Orthopnea. Appetite ok. Weight at home 115  pounds. Taking all medications. Planning EGD with GI soon.   Wt Readings from Last 3 Encounters:  01/25/24 51.4 kg (113 lb 6.4 oz)  01/12/24 54.3 kg (119 lb 12.8 oz)  01/08/24 54.3 kg (119 lb 12.8 oz)   BP 130/74   Pulse 71   Wt 51.4 kg (113 lb 6.4 oz)   SpO2 100%   BMI 17.24 kg/m   PHYSICAL EXAM: General:  NAD. No resp difficulty, walked into clinic, elderly HEENT: Normal Neck: Supple. No JVD. Cor: Regular rate & rhythm. No rubs, gallops or murmurs. Lungs: Clear Abdomen: Soft, nontender, nondistended.  Extremities: No cyanosis, clubbing, rash, edema Neuro: Alert & oriented x 3, moves all 4 extremities w/o difficulty. Affect pleasant.  Device interrogation (personally reviewed): OptiVol ok, stable thoracic impedence, 0.9 hr/day activity, no VT  DATA REVIEW  ECG: 08/21/23: atrial fibrillation with BiV pacing.  01/08/24: AF, V paced, PVC  ECHO: 01/29/23: LVEF 30-35%, normal RV function, mod MR.   08/22/23: LVEF 35-40% 01/03/24: EF 55%, normal RV, moderate to severe MR  ASSESSMENT & PLAN: Chronic HFrEF, now with recovered EF - Nonischemic cardiomyopathy - s/p ICD - EF has recovered by 10/25 echo: EF 50-55% - NYHA II-III, functional class confounded by deconditioning. Volume OK by exam and device. - GDMT limited by drug intolerance. - Continue Lasix  20 mg bid + 20 KCL bid - Continue losartan  12.5 mg qhs - Did not tolerate spiro, dig, hydral/nitrate, SGLT2i, or beta blockers.  - Not a candidate for advanced therapies. EF now improved. - Labs today.   CVA - Admitted in 6/25 with acute stroke; motor deficits resolved.  - He has stopped his statin - New CVA  11/25, s/p thrombectomy - Felt to be embolus 2/2 to AF - Now back on Eliquis  2.5 mg bid. No bleeding issues - CBC today  GIB - recent admit with GIB - Endoscopy with oral ulcers - GI planning EGD soon.  Permanent atrial fibrillation - s/p multiple ablations - Currently on mexelitine 150 mg bid - On Eliqius  2.5 mg bid. Discussed importance of un-interrupted dosing - Referred to EP for Watchman consideration   CKD - Baseline sCr 1.8-2.4 - Followed by nephrology - Labs today.  MR - moderate to severe on echo 10/25 - follow  Pre-OP CV Risk Optimization - Planning EGD soon - low CV risk for low risk procedure - has had recent CVA and now on [facility] and planning Watchman in future with EP (needs to be 6 weeks on un-interrupted AC) - Discussed with Dr. Cherrie & PharmD, do not hold/stop Eliquis . OK for procedure.  Follow up in 6 months with Dr. Zenaida. If EF remains stable, consider graduation from [facility] clinic  [place], FNP-BC 01/25/24

## 2024-01-25 NOTE — Telephone Encounter (Signed)
 Called and spoke to pt's wife June to confirm/remind patient of their appointment at the [facility] on 01/25/24.   Appointment:   [x] Confirmed  [] Left mess   [] No answer/No voice mail  [] VM Full/unable to leave message  [] Phone not in service  Patient reminded to bring all medications and/or complete list.  Confirmed patient has transportation. Gave directions, instructed to utilize valet parking.

## 2024-01-28 ENCOUNTER — Telehealth (HOSPITAL_COMMUNITY): Payer: Self-pay | Admitting: *Deleted

## 2024-01-28 ENCOUNTER — Other Ambulatory Visit (HOSPITAL_COMMUNITY): Payer: Self-pay | Admitting: *Deleted

## 2024-01-28 DIAGNOSIS — I5020 Unspecified systolic (congestive) heart failure: Secondary | ICD-10-CM

## 2024-01-28 DIAGNOSIS — E876 Hypokalemia: Secondary | ICD-10-CM

## 2024-01-28 MED ORDER — POTASSIUM CHLORIDE CRYS ER 20 MEQ PO TBCR
EXTENDED_RELEASE_TABLET | ORAL | 3 refills | Status: AC
Start: 2024-01-28 — End: ?

## 2024-01-28 NOTE — Telephone Encounter (Signed)
 Called patients' wife per Harlene Gainer, NP with following lab results and instructions:   K is low. Increase KCL to 40 q am and 20 q pm.   Repeat BMET in 2 weeks   Wife verbalized understanding of same. She requested labs be done at [facility] in [place] as previously done.  Rx sent and orders for lab placed.

## 2024-01-31 ENCOUNTER — Encounter: Payer: Self-pay | Admitting: Cardiology

## 2024-01-31 NOTE — Telephone Encounter (Signed)
 Spoke with patient's wife to get an update. He is scheduled for EGD 11/21. Will follow up afterwards.

## 2024-02-01 ENCOUNTER — Ambulatory Visit: Admitting: Nurse Practitioner

## 2024-02-05 NOTE — Telephone Encounter (Signed)
 Left voicemail for Dr. Charlann nurse to fax over most recent OV note and EGD report.

## 2024-02-05 NOTE — Telephone Encounter (Signed)
 Spoke with wife. Patient had EGD 11/21 and it went well with no concerns. She stated he is f/u with Dr. Garlan in 6 months but if patient doing well then can cancel that f/u appointment.   Will request records from Dr. Charlann office. Their number is [phone].

## 2024-02-11 NOTE — Telephone Encounter (Signed)
 Left another voicemail requesting records and also requested a call back from Dr. Charlann nurse.

## 2024-02-13 ENCOUNTER — Ambulatory Visit (HOSPITAL_COMMUNITY): Payer: Self-pay | Admitting: Family Medicine

## 2024-02-13 ENCOUNTER — Telehealth: Payer: Self-pay | Admitting: Student in an Organized Health Care Education/Training Program

## 2024-02-13 LAB — BASIC METABOLIC PANEL WITH GFR
BUN/Creatinine Ratio: 14 (ref 10–24)
BUN: 26 mg/dL (ref 8–27)
CO2: 19 mmol/L — ABNORMAL LOW (ref 20–29)
Calcium: 9.2 mg/dL (ref 8.6–10.2)
Chloride: 103 mmol/L (ref 96–106)
Creatinine, Ser: 1.87 mg/dL — ABNORMAL HIGH (ref 0.76–1.27)
Glucose: 96 mg/dL (ref 70–99)
Potassium: 3.4 mmol/L — ABNORMAL LOW (ref 3.5–5.2)
Sodium: 139 mmol/L (ref 134–144)
eGFR: 37 mL/min/1.73 — ABNORMAL LOW (ref >59)

## 2024-02-13 NOTE — Telephone Encounter (Signed)
 [facility] Clinic called to see if they could add labs for this pt please advise   [phone] Option # 2

## 2024-02-13 NOTE — Telephone Encounter (Signed)
 Added to 02/22/24 notes that patient needs BMET per HF clinic

## 2024-02-14 NOTE — Telephone Encounter (Addendum)
 Received a message from Dr. Charlann office. They stated they faxed requested records to office 11/25 and 12/1. Our office have not received anything.   Left them a message with same day fax #[phone].  Their office returned call to say they faxed it to the above fax number. Will await fax.

## 2024-02-14 NOTE — Telephone Encounter (Signed)
 Left another message requesting records from Dr. Charlann office.

## 2024-02-15 NOTE — Telephone Encounter (Signed)
 Records are in epic for review.

## 2024-02-19 ENCOUNTER — Encounter (HOSPITAL_BASED_OUTPATIENT_CLINIC_OR_DEPARTMENT_OTHER): Payer: Self-pay | Admitting: Family

## 2024-02-19 ENCOUNTER — Ambulatory Visit (INDEPENDENT_AMBULATORY_CARE_PROVIDER_SITE_OTHER): Admitting: Family

## 2024-02-19 VITALS — BP 124/60 | HR 68 | Ht 68.0 in | Wt 114.0 lb

## 2024-02-19 DIAGNOSIS — E785 Hyperlipidemia, unspecified: Secondary | ICD-10-CM

## 2024-02-19 DIAGNOSIS — Z8673 Personal history of transient ischemic attack (TIA), and cerebral infarction without residual deficits: Secondary | ICD-10-CM

## 2024-02-19 DIAGNOSIS — I2510 Atherosclerotic heart disease of native coronary artery without angina pectoris: Secondary | ICD-10-CM

## 2024-02-19 DIAGNOSIS — Z8719 Personal history of other diseases of the digestive system: Secondary | ICD-10-CM

## 2024-02-19 DIAGNOSIS — I4821 Permanent atrial fibrillation: Secondary | ICD-10-CM

## 2024-02-19 DIAGNOSIS — Z79899 Other long term (current) drug therapy: Secondary | ICD-10-CM

## 2024-02-19 DIAGNOSIS — I5020 Unspecified systolic (congestive) heart failure: Secondary | ICD-10-CM

## 2024-02-19 DIAGNOSIS — D6859 Other primary thrombophilia: Secondary | ICD-10-CM

## 2024-02-19 NOTE — Patient Instructions (Addendum)
 Medication Instructions:  Continue your current medications.   *If you need a refill on your cardiac medications before your next appointment, please call your pharmacy*  Lab Work: We will get BMET and direct LDL at your visit Friday with Dr. Almetta.    Follow-Up: At [facility], you and your health needs are our priority.  As part of our continuing mission to provide you with exceptional heart care, our providers are all part of one team.  This team includes your primary Cardiologist (physician) and Advanced Practice Providers or APPs (Physician Assistants and Nurse Practitioners) who all work together to provide you with the care you need, when you need it.  Your next appointment:   3 month(s)  Provider:   You may see Vishnu P Mallipeddi, MD or the following Advanced Practice Provider on your designated Care Team:   Almarie Crate, NP    Other Instructions

## 2024-02-19 NOTE — Progress Notes (Signed)
 Cardiology Office Note   Date:  02/19/2024  ID:  Brent Ray., DOB 1945/03/15, MRN [id number] PCP: Brent Bigness, MD  [facility] Cardiologist:  Brent SHAUNNA Maywood, MD Electrophysiologist:  Brent FORBES Furbish, MD     History of Present Illness Brent Ray. is a 78 y.o. 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  He had CVA 08/2023 (left ICA occlusion).   Admission 12/2023 with chest pain, HS troponin flat. Echo LVEF 55%, RV normal, moderate to severe MR. R/LHC cancelled due to Hb down to 7. Chest pain attributed to anemia. GI endoscopy after OAC hold with markedly raw, friable gastric mucosa and gum ulcers. Required PRB and iron. Advised to remain off OAC.  Admitted 01/2024 with new CVA (R M2 occlusion) requiring thombectomy. This was attributed to embolus from atrial fib. Heparin  eventually transitioned to Eliquis  2.5mg  BID. Recommended for outpatient discussion of Watchman.  Seen by [facility] 01/25/24. He was noted to intolerance to nearly every class of GDMT available (Spironolactone , digoxin, hydralazine /nitrate, SGLT2i, beta blocker). As EF improved, no medication changes made.   Prior intolerance ot Rosuvastatin , Pravastatin . 01/13/24 lipid panel TC 134, trig 62, HDL 51, LDL 71.   Presents today for follow up with his wife. He is working with PT once per week. Does not shuffle feet anymore after PT which he is pleased by. Plans to return to regular exercise regimen 3x per week after completing PT. Taking Lasix  morning and 1pm. Does not have any swelling. Exertional dyspnea is much improved from prior. No chest pain, pressure, tightness. Discussed LDL goal  <55. He has upcoming visit to discuss Watchman, discussed benefit of Watchman and gave him sample device to view.   ROS: Please see the history of present illness.    All other systems reviewed and are negative.   Studies Reviewed      Cardiac Studies & Procedures   ______________________________________________________________________________________________ CARDIAC CATHETERIZATION  CARDIAC CATHETERIZATION 12/13/2015  Conclusion Images from the original result were not included.   Ost RCA to Prox RCA lesion, 40 %stenosed.  Ost 1st Diag to 1st Diag lesion, 60 %stenosed.  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  LV end diastolic pressure is moderately elevated.  The left ventricular ejection fraction is less than 25% by visual estimate.  Brent Ray. is a 78 y.o. male   [id number] LOCATION:  FACILITY: [facility] PHYSICIAN: Brent Ray, M.D. [birth year]   DATE OF PROCEDURE:  12/13/2015  DATE OF DISCHARGE:     CARDIAC CATHETERIZATION    History obtained from chart review. Brent Ray is a 78 year old thin-appearing married Caucasian male admitted with chest pain. He has a history of hypertension and hyperlipidemia as well as paroxysmal atrial fibrillation. He has had 2 cardiac catheterizations in  the past that showed nonobstructive disease. He was transferred from [facility] with chest pain and was seen by Dr. Burnard. His EKG showed no acute changes. He had slight elevation of his troponin of 0.3. He presents today for cardiac catheterization.  Impression Brent Ray has mild to moderate CAD. His RCA has mildly progressed and now has 40 per percent segmental proximal disease. The first diagonal  Branch is small to medium in size with 60% hypodense ostial stenosis. The most  notable part of his cardiac catheterization is his severe LV dysfunction in the 20-25% range with severe global hypokinesia. This is consistent with takasubo   cardiomyopathy. His LVEDP was elevated at 29. He was having persistent chest pain. I placed him on IV nitroglycerin . He will need appropriate medications including diuretics, ACE inhibitor and beta blockers. The sheath was removed and a TR band was placed on the right wrist to achieve patent hemostasis. The patient left the cath lab in stable condition to the stepdown unit in 2 H.  Brent Ray. MD, [facility] 12/13/2015 3:37 PM  Findings Coronary Findings Diagnostic  Dominance: Right  Left Anterior Descending  First Diagonal Branch  Right Coronary Artery  Intervention  No interventions have been documented.   CARDIAC CATHETERIZATION  CARDIAC CATHETERIZATION 08/24/2014   STRESS TESTS  MYOCARDIAL PERFUSION IMAGING 02/27/2014   ECHOCARDIOGRAM  ECHOCARDIOGRAM COMPLETE 01/03/2024  Narrative ECHOCARDIOGRAM REPORT    Patient Name:   Brent Ray. Date of Exam: 01/03/2024 Medical Rec #:  [id number]            Height:       68.0 in Accession #:    [id number]           Weight:       113.1 lb Date of Birth:  01-24-1946            BSA:          1.604 m Patient Age:    78 years             BP:           141/87 mmHg Patient Gender: M                    HR:           77 bpm. Exam Location:  [facility]  Procedure: 2D Echo, Cardiac Doppler and Color Doppler (Both Spectral and Color Flow Doppler were utilized during procedure).  Indications:    Chest Pain R07.9, Pericardial effusion I31.3  History:        Patient has prior history of Echocardiogram examinations, most recent 08/22/2023. CAD, Stroke, Arrythmias:Atrial Flutter, PVC and Atrial Fibrillation, Signs/Symptoms:Chest Pain, Fatigue and Shortness of Breath; Risk Factors:Hypertension and Dyslipidemia. H/O Chronic kidney disease, Second degree AV block, Hyperlipidemia.  Sonographer:    Brent Ray Referring Phys: [id number] Brent Ray  IMPRESSIONS   1. Left ventricular ejection fraction, by estimation, is 55%.  The left ventricle has normal function. Left ventricular endocardial border not optimally defined to evaluate regional wall motion. Left ventricular diastolic function could not be evaluated. 2. Right ventricular systolic function is normal. The right ventricular size is normal. There is normal pulmonary artery systolic pressure. 3. Left atrial size was severely dilated. 4. Right atrial size was severely dilated. 5. The mitral valve is abnormal. Moderate to severe mitral valve regurgitation. No evidence of mitral stenosis. There is mild prolapse of posterior leaflet of the mitral valve. The mean mitral valve gradient is 1.0 mmHg. 6. Two TR jets seen, mild in intensity. 7. The aortic valve is tricuspid. Aortic valve regurgitation is not visualized. No aortic stenosis is present. Aortic valve mean gradient measures 2.0 mmHg. 8. The inferior vena cava is normal in size with greater than 50% respiratory variability, suggesting right atrial pressure of 3 mmHg.  Comparison(s): Changes from prior study are noted. LVEF improved from 35-40% to 55% now.  FINDINGS Left Ventricle: Left ventricular ejection fraction, by estimation, is  55%. The left ventricle has normal function. Left ventricular endocardial border not optimally defined to evaluate regional wall motion. Strain was performed and the global longitudinal strain is indeterminate. The left ventricular internal cavity size was normal in size. There is no left ventricular hypertrophy. Abnormal (paradoxical) septal motion, consistent with RV pacemaker. Left ventricular diastolic function could not be evaluated due to abnormal septal motion. Left ventricular diastolic function could not be evaluated.  Right Ventricle: The right ventricular size is normal. No increase in right ventricular wall thickness. Right ventricular systolic function is normal. There is normal pulmonary artery systolic pressure. The tricuspid regurgitant velocity is 2.68 m/s,  and with an assumed right atrial pressure of 3 mmHg, the estimated right ventricular systolic pressure is 31.7 mmHg.  Left Atrium: Left atrial size was severely dilated.  Right Atrium: Right atrial size was severely dilated.  Pericardium: There is no evidence of pericardial effusion.  Mitral Valve: The mitral valve is abnormal. There is mild prolapse of posterior leaflet of the mitral valve. Moderate to severe mitral valve regurgitation. No evidence of mitral valve stenosis. MV peak gradient, 3.5 mmHg. The mean mitral valve gradient is 1.0 mmHg.  Tricuspid Valve: Two TR jets seen, mild in intensity. The tricuspid valve is normal in structure. Tricuspid valve regurgitation is mild . No evidence of tricuspid stenosis.  Aortic Valve: The aortic valve is tricuspid. Aortic valve regurgitation is not visualized. No aortic stenosis is present. Aortic valve mean gradient measures 2.0 mmHg. Aortic valve peak gradient measures 3.8 mmHg. Aortic valve area, by VTI measures 2.20 cm.  Pulmonic Valve: The pulmonic valve was normal in structure. Pulmonic valve regurgitation is trivial. No evidence of pulmonic stenosis.  Aorta: The aortic root and ascending aorta are structurally normal, with no evidence of dilitation.  Venous: The inferior vena cava is normal in size with greater than 50% respiratory variability, suggesting right atrial pressure of 3 mmHg.  IAS/Shunts: No atrial level shunt detected by color flow Doppler.  Additional Comments: 3D was performed not requiring image post processing on an independent workstation and was indeterminate. A device lead is visualized.   LEFT VENTRICLE PLAX 2D LVIDd:         4.90 cm LVIDs:         3.40 cm LV PW:         0.70 cm LV IVS:        0.60 cm LVOT diam:     2.00 cm LV SV:         44 LV SV Index:   27 LVOT Area:     3.14 cm  LV Volumes (MOD) LV vol d, MOD A2C: 120.0 ml LV vol d, MOD A4C: 131.0 ml LV vol s, MOD A2C: 44.0 ml LV vol s, MOD  A4C: 59.5 ml LV SV MOD A2C:     76.0 ml LV SV MOD A4C:     131.0 ml LV SV MOD BP:      72.1 ml  RIGHT VENTRICLE             IVC RV Basal diam:  3.60 cm     IVC diam: 1.60 cm RV S prime:     11.40 cm/s TAPSE (M-mode): 1.5 cm RVSP:           31.7 mmHg  LEFT ATRIUM              Index        RIGHT ATRIUM  Index LA diam:        4.40 cm  2.74 cm/m   RA Pressure: 3.00 mmHg LA Vol (A2C):   110.0 ml 68.58 ml/m  RA Area:     23.20 cm LA Vol (A4C):   73.7 ml  45.95 ml/m  RA Volume:   68.00 ml  42.39 ml/m LA Biplane Vol: 90.7 ml  56.55 ml/m AORTIC VALVE                    PULMONIC VALVE AV Area (Vmax):    2.32 cm     PV Vmax:          0.69 m/s AV Area (Vmean):   2.05 cm     PV Peak grad:     1.9 mmHg AV Area (VTI):     2.20 cm     PR End Diast Vel: 5.02 msec AV Vmax:           97.50 cm/s AV Vmean:          69.700 cm/s AV VTI:            0.200 m AV Peak Grad:      3.8 mmHg AV Mean Grad:      2.0 mmHg LVOT Vmax:         71.90 cm/s LVOT Vmean:        45.500 cm/s LVOT VTI:          0.140 m LVOT/AV VTI ratio: 0.70  AORTA Ao Root diam: 3.40 cm Ao Asc diam:  3.40 cm  MITRAL VALVE                  TRICUSPID VALVE MV Area (PHT): 2.46 cm       TR Peak grad:   28.7 mmHg MV Area VTI:   1.80 cm       TR Vmax:        268.00 cm/s MV Peak grad:  3.5 mmHg       Estimated RAP:  3.00 mmHg MV Mean grad:  1.0 mmHg       RVSP:           31.7 mmHg MV Vmax:       0.94 m/s MV Vmean:      48.0 cm/s      SHUNTS MV Decel Time: 309 msec       Systemic VTI:  0.14 m MR Peak grad:    118.1 mmHg   Systemic Diam: 2.00 cm MR Mean grad:    76.5 mmHg MR Vmax:         543.33 cm/s MR Vmean:        411.5 cm/s MR PISA:         6.28 cm MR PISA Eff ROA: 36 mm MR PISA Radius:  1.00 cm MV E velocity: 88.20 cm/s MV A velocity: 31.10 cm/s MV E/A ratio:  2.84  Vishnu Priya Mallipeddi Electronically signed by Brent Late Mallipeddi Signature Date/Time: 01/03/2024/3:06:13 PM    Final     MONITORS  CARDIAC EVENT MONITOR 09/09/2014  Narrative 30 day event recorder reviewed. Predominant rhythm is sinus with prolonged PR interval and evidence of significant bradycardia down into the 30s to 40s at times. Cannot exclude transient 2:1 heart block with indistinct P waves, also junctional rhythm noted. Brief episode of slow atrial fibrillation seen as well. Occasional PVCs. No pauses. Consistent with sick sinus syndrome.       ______________________________________________________________________________________________  Risk Assessment/Calculations  CHA2DS2-VASc Score = 5   This indicates a 7.2% annual risk of stroke. The patient's score is based upon: CHF History: 1 HTN History: 1 Diabetes History: 0 Stroke History: 0 Vascular Disease History: 1 Age Score: 2 Gender Score: 0            Physical Exam VS:  BP 124/60 (BP Location: Right Arm, Patient Position: Sitting, Cuff Size: Normal)   Pulse 68   Ht 5' 8 (1.727 m)   Wt 114 lb (51.7 kg)   SpO2 97%   BMI 17.33 kg/m        Wt Readings from Last 3 Encounters:  02/19/24 114 lb (51.7 kg)  01/25/24 113 lb 6.4 oz (51.4 kg)  01/12/24 119 lb 12.8 oz (54.3 kg)    GEN: Well nourished, well developed in no acute distress NECK: No JVD; No carotid bruits CARDIAC: RRR, no murmurs, rubs, gallops RESPIRATORY:  Clear to auscultation without rales, wheezing or rhonchi  ABDOMEN: Soft, non-tender, non-distended EXTREMITIES:  No edema; No deformity   ASSESSMENT AND PLAN  NICM with recovered LVEF - Euvolemic and well compensated on exam. NYHA I-II. Previous intolerant to multiple GDMT agents. [facility] with [facility]. Continue Losartan  12.5mg  daily, furosemide  20mg  BID, Potassium 40mEq BID. Labs later this week to reassess potassium.   Hx of CVA - Gate shuffle improved with PT. BP at goal <130/80. LDL management, as below.   HLD, LDL goal <55 - prior intolerance to Rosuvastatin  and Pravastatin . Was  previously able to tolerate statin once per week but reports discontinued during a hospital stay. 08/2013 LDL 54 ? 01/13/24 LDL 71. Discussed possibility of Leqvio, he was given pamphlet to review and encouraged to discuss with Dr. Malipeddi at follow up. Update direct LDL with labs later this week.  Nonobstructive CAD - Stable with no anginal symptoms. No indication for ischemic evaluation.  No beta blocker due to intolerance. No ASA due to OAC. Lipid management, as above. Recommend aiming for 150 minutes of moderate intensity activity per week and following a heart healthy diet.    Permanent atrial fib / Hypercoagulable state / Hx of GI bleed - Per Dr. Nancey.  Maintained on Eliquis  2.5 mg twice daily, mexiletine 150 mg twice daily.  Previously did not tolerate beta-blockers.  CHA2DS2-VASc Score = 5 [CHF History: 1, HTN History: 1, Diabetes History: 0, Stroke History: 0, Vascular Disease History: 1, Age Score: 2, Gender Score: 0].  Therefore, the patient's annual risk of stroke is 7.2 %.    Due to history of GI bleed and CVA while off OAC he has upcoming visit with Dr. Almetta to discuss Watchman. Handout provided and he was given Watchman sample device to view.   S/p PPM - Per EP.       Dispo: follow up with Dr. Malipeddi in 3 months  Signed, Reche GORMAN Finder, NP

## 2024-02-20 ENCOUNTER — Ambulatory Visit: Payer: Self-pay

## 2024-02-20 ENCOUNTER — Ambulatory Visit: Admitting: Gastroenterology

## 2024-02-21 LAB — CUP PACEART REMOTE DEVICE CHECK
Battery Remaining Longevity: 57 mo
Battery Voltage: 2.97 V
Brady Statistic AP VP Percent: 0 %
Brady Statistic AP VS Percent: 0 %
Brady Statistic AS VP Percent: 92.17 %
Brady Statistic AS VS Percent: 7.83 %
Brady Statistic RA Percent Paced: INVALID
Brady Statistic RV Percent Paced: 92.17 %
Date Time Interrogation Session: 20251209211029
HighPow Impedance: 66 Ohm
Implantable Lead Connection Status: 753985
Implantable Lead Connection Status: 753985
Implantable Lead Connection Status: 753985
Implantable Lead Implant Date: 20171121
Implantable Lead Implant Date: 20230414
Implantable Lead Implant Date: 20230414
Implantable Lead Location: 753858
Implantable Lead Location: 753859
Implantable Lead Location: 753860
Implantable Lead Model: 4798
Implantable Lead Model: 5076
Implantable Pulse Generator Implant Date: 20230414
Lead Channel Impedance Value: 247 Ohm
Lead Channel Impedance Value: 266 Ohm
Lead Channel Impedance Value: 304 Ohm
Lead Channel Impedance Value: 304 Ohm
Lead Channel Impedance Value: 323 Ohm
Lead Channel Impedance Value: 342 Ohm
Lead Channel Impedance Value: 380 Ohm
Lead Channel Impedance Value: 437 Ohm
Lead Channel Impedance Value: 513 Ohm
Lead Channel Impedance Value: 532 Ohm
Lead Channel Impedance Value: 570 Ohm
Lead Channel Impedance Value: 589 Ohm
Lead Channel Impedance Value: 608 Ohm
Lead Channel Pacing Threshold Amplitude: 1 V
Lead Channel Pacing Threshold Amplitude: 1.875 V
Lead Channel Pacing Threshold Pulse Width: 0.4 ms
Lead Channel Pacing Threshold Pulse Width: 0.4 ms
Lead Channel Sensing Intrinsic Amplitude: 1.3 mV
Lead Channel Sensing Intrinsic Amplitude: 9.3 mV
Lead Channel Setting Pacing Amplitude: 2 V
Lead Channel Setting Pacing Amplitude: 3 V
Lead Channel Setting Pacing Pulse Width: 0.4 ms
Lead Channel Setting Pacing Pulse Width: 0.4 ms
Lead Channel Setting Sensing Sensitivity: 0.3 mV
Zone Setting Status: 755011

## 2024-02-22 ENCOUNTER — Ambulatory Visit
Attending: Student in an Organized Health Care Education/Training Program | Admitting: Student in an Organized Health Care Education/Training Program

## 2024-02-22 ENCOUNTER — Encounter: Payer: Self-pay | Admitting: Student in an Organized Health Care Education/Training Program

## 2024-02-22 ENCOUNTER — Telehealth: Payer: Self-pay

## 2024-02-22 VITALS — BP 120/73 | HR 89 | Ht 68.0 in | Wt 114.0 lb

## 2024-02-22 DIAGNOSIS — I4821 Permanent atrial fibrillation: Secondary | ICD-10-CM

## 2024-02-22 DIAGNOSIS — I4892 Unspecified atrial flutter: Secondary | ICD-10-CM

## 2024-02-22 DIAGNOSIS — I493 Ventricular premature depolarization: Secondary | ICD-10-CM

## 2024-02-22 NOTE — Progress Notes (Unsigned)
 Cardiology Office Note   Date: 02/22/24 ID:  Brent JINNY Marien Mickey., DOB [birth year], MRN [id number] PCP: Meade Bigness, MD  [facility] Providers Cardiologist:  Diannah SHAUNNA Maywood, MD Electrophysiologist:  Eulas FORBES Furbish, MD   History of Present Illness Brent Ray. is a 78 y.o. 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   He was hospitalized for ICA dissection secondary traumatic fall resulting in CVA at which time he was on Xarelto .  He was switched to apixaban  by neurology.  He was admitted on 01/05/2024 for chest pain with a flat hsT. CTA chest with small pericardial effusion and LVEF preserved on TTE. Moderate-severe MR on TTE.  He was planned for [facility] however this was cancelled with precipitous decline in his hemoglobin to 7.  His OAC was held and he underwent EGD which showed friable gastric mucosa but no focal ulcers.  He received transfusion and IV iron and remained off OAC at discharge.  He was seen on 01/08/2024 for post Hospital follow-up and was doing okay from a HF perspective but had been off OAC was not interested in resuming this.  He had also been seen recently for hemorrhoidal bleeding.  He presented on 01/12/2024 with left-sided weakness and was found to have a R M2 occlusion and taken for emergent thrombectomy which was thought to be 2/2 embolus from AF.  He was started on heparin  and aspirin  at that time.  MRI brain with right insular infarct.  Recurrent anemia of 12.9->10.1.  ROS: generalized malaise  Studies Reviewed  ECG review 02/22/24: AF/VP 71, QRS 150, QT/c 442/480   TTE Result date: 01/03/24  1. Left ventricular ejection fraction, by  estimation, is 55%. The left  ventricle has normal function. Left ventricular endocardial border not  optimally defined to evaluate regional wall motion. Left ventricular  diastolic function could not be evaluated.   2. Right ventricular systolic function is normal. The right ventricular  size is normal. There is normal pulmonary artery systolic pressure.   3. Left atrial size was severely dilated.   4. Right atrial size was severely dilated.   5. The mitral valve is abnormal. 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 No evidence of mitral stenosis. There is mild prolapse of  posterior leaflet of the mitral valve. The mean mitral valve gradient is  1.0 mmHg.   6. Two TR jets seen, mild in intensity.   7. The aortic valve is tricuspid. Aortic valve regurgitation is not  visualized. No aortic stenosis is present. Aortic valve mean gradient  measures 2.0 mmHg.   8. The inferior vena cava is normal in size with greater than 50%  respiratory variability, suggesting right atrial pressure of 3 mmHg.  Comparison(s): Changes from prior study are noted. LVEF improved from  35-40% to 55% now.   Risk Assessment/Calculations  CHA2DS2-VASc Score = 5  This indicates a 7.2% annual risk of stroke. The patient's score is based upon: CHF History: 1 HTN History: 1 Diabetes History: 0 Stroke History: 0 Vascular Disease History: 1 Age Score: 2 Gender Score: 0  Physical Exam VS:  BP 120/73   Pulse 89   Ht 5' 8 (1.727 m)   Wt 114  lb (51.7 kg)   SpO2 97%   BMI 17.33 kg/m   Wt Readings from Last 3 Encounters:  02/22/24 114 lb (51.7 kg)  02/19/24 114 lb (51.7 kg)  01/25/24 113 lb 6.4 oz (51.4 kg)    GEN: frail, extremely thin, conversant  NECK: No JVD; No carotid bruits CARDIAC: RRR, no murmurs, rubs, gallops RESPIRATORY:  Clear to auscultation without rales, wheezing or rhonchi  ABDOMEN: Soft, non-tender, non-distended EXTREMITIES:  No edema; No deformity  SKIN: LEFT  infraclavicular incision without erythema, swelling, well-healed   ASSESSMENT AND PLAN Brent Ray. is a 78 y.o. 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  Assessment & Plan Atrial fibrillation with recurrent stroke, planning for Watchman device Recurrent strokes with a 7% annual stroke risk. Watchman device reduces risk to ~1% per year, similar to anticoagulation. Procedure feasible with general anesthesia and imaging guidance. Post-procedure anticoagulation required for six months, then aspirin  and Plavix, followed by aspirin  alone. Reviewed with family that he is high risk for complications with his frailty, small size and recurrent strokes. They understand these risks and he and family wish to proceed to get off OAC long term.  - Ordered pre-procedure CT scan to assess outpouching tissue. - Perform Watchman device procedure under general anesthesia with imaging guidance. - Administer high-dose anticoagulation during the procedure. - Transition to aspirin  and Plavix for six months post-procedure, then aspirin  alone.  History of gastrointestinal bleeding and gastric ulcer Gastrointestinal bleeding and gastric ulcers likely exacerbated by anticoagulation. Watchman device considered to reduce long-term anticoagulation need, mitigating gastrointestinal risks.  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 Watchman device considered to reduce long-term anticoagulation need, potentially improving bleeding symptoms.  Physical deconditioning and weakness  post-stroke Physical deconditioning and weakness post-stroke. Undergoing physical therapy and walking exercises, limited by cold weather and respiratory symptoms. - Continue physical therapy and walking exercises as tolerated.  I have seen Brent JINNY Marien Mickey. in the office today who is being considered for a Watchman left atrial appendage closure device. I believe they will benefit from this procedure given their history of atrial fibrillation, CHA2DS2-VASc score of 5 and unadjusted ischemic stroke rate of 7.2% per year. Unfortunately, the patient is not felt to be a long term anticoagulation candidate secondary to recurrent GI bleeding requiring multiple transfusions. The patient's chart has been reviewed and I feel that they would be a candidate for short term oral anticoagulation after Watchman implant.   It is my belief that after undergoing a LAA closure procedure, Brent JINNY Marien Mickey. will not need long term anticoagulation which eliminates anticoagulation side effects and major bleeding risk.   Procedural risks for the Watchman implant have been reviewed with the patient including a 0.5% risk of stroke, <1% risk of perforation and <1% risk of device embolization. Other risks include bleeding, vascular damage, tamponade, worsening renal function, and death. The patient understands these risk and wishes to proceed.    The published clinical data on the safety and effectiveness of WATCHMAN include but are not limited to the following: - Holmes DR, Jess BEARD, Sick P et al. for the  PROTECT AF Investigators. Percutaneous closure of the left atrial appendage versus warfarin therapy for prevention of stroke in patients with atrial fibrillation: a randomised non-inferiority trial. Lancet 2009; 374: 534-42. GLENWOOD Jess BEARD, Doshi SK, Jonita VEAR Satchel D et al. on behalf of the PROTECT AF Investigators. Percutaneous Left Atrial Appendage Closure for Stroke Prophylaxis in Patients With Atrial Fibrillation 2.3-Year  Follow-up of the PROTECT AF (Watchman Left Atrial Appendage System for Embolic Protection in Patients With Atrial Fibrillation) Trial. Circulation 2013; 127:720-729. - Alli O, Doshi S,  Kar S, Reddy VY, Sievert H et al. Quality of Life Assessment in the Randomized PROTECT AF (Percutaneous Closure of the Left Atrial Appendage Versus Warfarin Therapy for Prevention of Stroke in Patients With Atrial Fibrillation) Trial of Patients at Risk for Stroke With Nonvalvular Atrial Fibrillation. J Am Coll Cardiol 2013; 61:1790-8. GLENWOOD Satchel DR, Archer RAMAN, Price M, Whisenant B, Sievert H, Doshi S, Huber K, Reddy V. Prospective randomized evaluation of the Watchman left atrial appendage Device in patients with atrial fibrillation versus long-term warfarin therapy; the PREVAIL trial. Journal of the [employer] of Cardiology, Vol. 4, No. 1, 2014, 1-11. - Kar S, Doshi SK, Sadhu A, Horton R, Osorio J et al. Primary outcome evaluation of a next-generation left atrial appendage closure device: results from the PINNACLE FLX trial. Circulation 2021;143(18)1754-1762.   After today's visit with the patient which was dedicated solely for shared decision making visit regarding LAA closure device, the patient decided to proceed with the LAA appendage closure procedure. Prior to the procedure, I would like to obtain a gated CT scan of the chest with contrast timed for PV/LA visualization.   HAS-BLED score 3 Hypertension No  Abnormal renal and liver function (Dialysis, transplant, Cr >2.26 mg/dL /Cirrhosis or Bilirubin >2x Normal or AST/ALT/AP >3x Normal) No  Stroke Yes  Bleeding Yes  Labile INR (Unstable/high INR) No  Elderly (>65) Yes  Drugs or alcohol  (>= 8 drinks/week, anti-plt or NSAID) No   CHA2DS2-VASc Score = 5  The patient's score is based upon: CHF History: 1 HTN History: 1 Diabetes History: 0 Stroke History: 0 Vascular Disease History: 1 Age Score: 2 Gender Score: 0  NYHA class I   Procedure details:   Procedure type: LAAO/Watchman FLX Pro implant  Procedure date: 02/28/24  CT scan ordered Hold OAC the morning of procedure, last dose night prior    Dispo: RTC post Watchman implant   A total of 45 minutes was spent preparing for the patient, reviewing history, performing exam, document encounter, coordinating care and counseling the patient. 20 minutes was spent with direct patient care.   Signed, Donnice DELENA Primus, MD

## 2024-02-22 NOTE — H&P (View-Only) (Signed)
 Cardiology Office Note   Date: 02/22/24 ID:  Brent Ray., DOB [birth year], MRN [id number] PCP: Meade Bigness, MD  [facility] Providers Cardiologist:  Diannah SHAUNNA Maywood, MD Electrophysiologist:  Eulas FORBES Furbish, MD   History of Present Illness Brent Commons. is a 78 y.o. 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   He was hospitalized for ICA dissection secondary traumatic fall resulting in CVA at which time he was on Xarelto .  He was switched to apixaban  by neurology.  He was admitted on 01/05/2024 for chest pain with a flat hsT. CTA chest with small pericardial effusion and LVEF preserved on TTE. Moderate-severe MR on TTE.  He was planned for [facility] however this was cancelled with precipitous decline in his hemoglobin to 7.  His OAC was held and he underwent EGD which showed friable gastric mucosa but no focal ulcers.  He received transfusion and IV iron and remained off OAC at discharge.  He was seen on 01/08/2024 for post Hospital follow-up and was doing okay from a HF perspective but had been off OAC was not interested in resuming this.  He had also been seen recently for hemorrhoidal bleeding.  He presented on 01/12/2024 with left-sided weakness and was found to have a R M2 occlusion and taken for emergent thrombectomy which was thought to be 2/2 embolus from AF.  He was started on heparin  and aspirin  at that time.  MRI brain with right insular infarct.  Recurrent anemia of 12.9->10.1.  ROS: generalized malaise  Studies Reviewed  ECG review 02/22/24: AF/VP 71, QRS 150, QT/c 442/480   TTE Result date: 01/03/24  1. Left ventricular ejection fraction, by  estimation, is 55%. The left  ventricle has normal function. Left ventricular endocardial border not  optimally defined to evaluate regional wall motion. Left ventricular  diastolic function could not be evaluated.   2. Right ventricular systolic function is normal. The right ventricular  size is normal. There is normal pulmonary artery systolic pressure.   3. Left atrial size was severely dilated.   4. Right atrial size was severely dilated.   5. The mitral valve is abnormal. 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 No evidence of mitral stenosis. There is mild prolapse of  posterior leaflet of the mitral valve. The mean mitral valve gradient is  1.0 mmHg.   6. Two TR jets seen, mild in intensity.   7. The aortic valve is tricuspid. Aortic valve regurgitation is not  visualized. No aortic stenosis is present. Aortic valve mean gradient  measures 2.0 mmHg.   8. The inferior vena cava is normal in size with greater than 50%  respiratory variability, suggesting right atrial pressure of 3 mmHg.  Comparison(s): Changes from prior study are noted. LVEF improved from  35-40% to 55% now.   Risk Assessment/Calculations  CHA2DS2-VASc Score = 5  This indicates a 7.2% annual risk of stroke. The patient's score is based upon: CHF History: 1 HTN History: 1 Diabetes History: 0 Stroke History: 0 Vascular Disease History: 1 Age Score: 2 Gender Score: 0  Physical Exam VS:  BP 120/73   Pulse 89   Ht 5' 8 (1.727 m)   Wt 114  lb (51.7 kg)   SpO2 97%   BMI 17.33 kg/m   Wt Readings from Last 3 Encounters:  02/22/24 114 lb (51.7 kg)  02/19/24 114 lb (51.7 kg)  01/25/24 113 lb 6.4 oz (51.4 kg)    GEN: frail, extremely thin, conversant  NECK: No JVD; No carotid bruits CARDIAC: RRR, no murmurs, rubs, gallops RESPIRATORY:  Clear to auscultation without rales, wheezing or rhonchi  ABDOMEN: Soft, non-tender, non-distended EXTREMITIES:  No edema; No deformity  SKIN: LEFT  infraclavicular incision without erythema, swelling, well-healed   ASSESSMENT AND PLAN Brent Ellery. is a 78 y.o. 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  Assessment & Plan Atrial fibrillation with recurrent stroke, planning for Watchman device Recurrent strokes with a 7% annual stroke risk. Watchman device reduces risk to ~1% per year, similar to anticoagulation. Procedure feasible with general anesthesia and imaging guidance. Post-procedure anticoagulation required for six months, then aspirin  and Plavix, followed by aspirin  alone. Reviewed with family that he is high risk for complications with his frailty, small size and recurrent strokes. They understand these risks and he and family wish to proceed to get off OAC long term.  - Ordered pre-procedure CT scan to assess outpouching tissue. - Perform Watchman device procedure under general anesthesia with imaging guidance. - Administer high-dose anticoagulation during the procedure. - Transition to aspirin  and Plavix for six months post-procedure, then aspirin  alone.  History of gastrointestinal bleeding and gastric ulcer Gastrointestinal bleeding and gastric ulcers likely exacerbated by anticoagulation. Watchman device considered to reduce long-term anticoagulation need, mitigating gastrointestinal risks.  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 Watchman device considered to reduce long-term anticoagulation need, potentially improving bleeding symptoms.  Physical deconditioning and weakness  post-stroke Physical deconditioning and weakness post-stroke. Undergoing physical therapy and walking exercises, limited by cold weather and respiratory symptoms. - Continue physical therapy and walking exercises as tolerated.  I have seen Brent Ray. in the office today who is being considered for a Watchman left atrial appendage closure device. I believe they will benefit from this procedure given their history of atrial fibrillation, CHA2DS2-VASc score of 5 and unadjusted ischemic stroke rate of 7.2% per year. Unfortunately, the patient is not felt to be a long term anticoagulation candidate secondary to recurrent GI bleeding requiring multiple transfusions. The patient's chart has been reviewed and I feel that they would be a candidate for short term oral anticoagulation after Watchman implant.   It is my belief that after undergoing a LAA closure procedure, Brent Ray. will not need long term anticoagulation which eliminates anticoagulation side effects and major bleeding risk.   Procedural risks for the Watchman implant have been reviewed with the patient including a 0.5% risk of stroke, <1% risk of perforation and <1% risk of device embolization. Other risks include bleeding, vascular damage, tamponade, worsening renal function, and death. The patient understands these risk and wishes to proceed.    The published clinical data on the safety and effectiveness of WATCHMAN include but are not limited to the following: - Holmes DR, Jess BEARD, Sick P et al. for the  PROTECT AF Investigators. Percutaneous closure of the left atrial appendage versus warfarin therapy for prevention of stroke in patients with atrial fibrillation: a randomised non-inferiority trial. Lancet 2009; 374: 534-42. GLENWOOD Jess BEARD, Doshi SK, Jonita VEAR Satchel D et al. on behalf of the PROTECT AF Investigators. Percutaneous Left Atrial Appendage Closure for Stroke Prophylaxis in Patients With Atrial Fibrillation 2.3-Year  Follow-up of the PROTECT AF (Watchman Left Atrial Appendage System for Embolic Protection in Patients With Atrial Fibrillation) Trial. Circulation 2013; 127:720-729. - Alli O, Doshi S,  Kar S, Reddy VY, Sievert H et al. Quality of Life Assessment in the Randomized PROTECT AF (Percutaneous Closure of the Left Atrial Appendage Versus Warfarin Therapy for Prevention of Stroke in Patients With Atrial Fibrillation) Trial of Patients at Risk for Stroke With Nonvalvular Atrial Fibrillation. J Am Coll Cardiol 2013; 61:1790-8. GLENWOOD Satchel DR, Archer RAMAN, Price M, Whisenant B, Sievert H, Doshi S, Huber K, Reddy V. Prospective randomized evaluation of the Watchman left atrial appendage Device in patients with atrial fibrillation versus long-term warfarin therapy; the PREVAIL trial. Journal of the [employer] of Cardiology, Vol. 4, No. 1, 2014, 1-11. - Kar S, Doshi SK, Sadhu A, Horton R, Osorio J et al. Primary outcome evaluation of a next-generation left atrial appendage closure device: results from the PINNACLE FLX trial. Circulation 2021;143(18)1754-1762.   After today's visit with the patient which was dedicated solely for shared decision making visit regarding LAA closure device, the patient decided to proceed with the LAA appendage closure procedure. Prior to the procedure, I would like to obtain a gated CT scan of the chest with contrast timed for PV/LA visualization.   HAS-BLED score 3 Hypertension No  Abnormal renal and liver function (Dialysis, transplant, Cr >2.26 mg/dL /Cirrhosis or Bilirubin >2x Normal or AST/ALT/AP >3x Normal) No  Stroke Yes  Bleeding Yes  Labile INR (Unstable/high INR) No  Elderly (>65) Yes  Drugs or alcohol  (>= 8 drinks/week, anti-plt or NSAID) No   CHA2DS2-VASc Score = 5  The patient's score is based upon: CHF History: 1 HTN History: 1 Diabetes History: 0 Stroke History: 0 Vascular Disease History: 1 Age Score: 2 Gender Score: 0  NYHA class I   Procedure details:   Procedure type: LAAO/Watchman FLX Pro implant  Procedure date: 02/28/24  CT scan ordered Hold OAC the morning of procedure, last dose night prior    Dispo: RTC post Watchman implant   A total of 45 minutes was spent preparing for the patient, reviewing history, performing exam, document encounter, coordinating care and counseling the patient. 20 minutes was spent with direct patient care.   Signed, Donnice DELENA Primus, MD

## 2024-02-22 NOTE — Telephone Encounter (Addendum)
 Patient was seen in clinic by Dr. Almetta 12/12. Plan to have Watchman implant 12/18. Patient will have cCT 12/14 at 2:30 PM. Per Dr. Almetta, Dr. Santo will read CT.  CT and LAAO instructions given to patient and wife.   Confirmed prior auth for CT.   Will call patient and wife Tuesday to review instructions and CT results.

## 2024-02-22 NOTE — Patient Instructions (Signed)
 Medication Instructions:  Your physician recommends that you continue on your current medications as directed. Please refer to the Current Medication list given to you today.  *If you need a refill on your cardiac medications before your next appointment, please call your pharmacy*  Lab Work: None ordered.  If you have labs (blood work) drawn today and your tests are completely normal, you will receive your results only by: MyChart Message (if you have MyChart) OR A paper copy in the mail If you have any lab test that is abnormal or we need to change your treatment, we will call you to review the results.  Testing/Procedures: Your physician has requested that you have Left atrial appendage (LAA) closure device implantation is a procedure to put a small device in the LAA of the heart. The LAA is a small sac in the wall of the heart's left upper chamber. Blood clots can form in this area. The device, Watchman closes the LAA to help prevent a blood clot and stroke.    Follow-Up: At [facility], you and your health needs are our priority.  As part of our continuing mission to provide you with exceptional heart care, our providers are all part of one team.  This team includes your primary Cardiologist (physician) and Advanced Practice Providers or APPs (Physician Assistants and Nurse Practitioners) who all work together to provide you with the care you need, when you need it.  Your next appointment:   To be scheduled  We recommend signing up for the patient portal called MyChart.  Sign up information is provided on this After Visit Summary.  MyChart is used to connect with patients for Virtual Visits (Telemedicine).  Patients are able to view lab/test results, encounter notes, upcoming appointments, etc.  Non-urgent messages can be sent to your provider as well.   To learn more about what you can do with MyChart, go to [email or web address].   Other Instructions  WATCHMAN CT  INSTRUCTIONS: Please arrive Monday, 02/25/2024.  Please arrive at 2pm  Your WATCHMAN CT will be scheduled at the [facility] ([address] You will be called to confirm appointment date and time.  The day of your CT appointment, please use the FREE valet parking offered at the [facility] entrance (encouraged to control the heart rate for the test). You will check in on the first floor then proceed to the second floor to complete the registration process and for CT scanning.   Please follow these instructions carefully:  Hold all erectile dysfunction medications at least 3 days (72 hrs) prior to test.  On the Night Before the Test: Be sure to Drink plenty of water . Do not consume any caffeinated/decaffeinated beverages or chocolate 12 hours prior to your test. Do not take any antihistamines 12 hours prior to your test.   On the Day of the Test: Drink plenty of water , but do not eat or drink 1 hour prior to your scheduled CT appointment time. You may take your regular medications prior to the test.  HOLD Furosemide /Hydrochlorothiazide morning of the test. Please wear underwire-free bra if available      After the Test: Drink plenty of water . After receiving IV contrast, you may experience a mild flushed feeling. This is normal. On occasion, you may experience a mild rash up to 24 hours after the test. This is not dangerous. If this occurs, you can take Benadryl  25 mg and increase your fluid intake. If  you experience trouble breathing, this can be serious. If it is severe call 911 IMMEDIATELY. If it is mild, please call our office. If you take any of these medications: Glipizide/Metformin, Avandament, Glucavance, please do not take 48 hours after completing test unless otherwise instructed.  Once we have confirmed authorization from your insurance company, you will be called to set up a date and time for your test.   For non-scheduling  related questions/concerns about your CT scan, please contact the cardiac imaging nurses: Camie Shutter, Cardiac Imaging Nurse Navigator Chantal Requena, Cardiac Imaging Nurse Navigator [facility] Direct Office Dial: [phone]   For scheduling needs, including cancellations and rescheduling, please call Brittany, [phone].  The Watchman Nurse Navigator will contact you after your CT to discuss results and schedule your procedure.    Thank you!

## 2024-02-25 ENCOUNTER — Telehealth: Payer: Self-pay

## 2024-02-25 ENCOUNTER — Ambulatory Visit (HOSPITAL_COMMUNITY)
Admission: RE | Admit: 2024-02-25 | Discharge: 2024-02-25 | Attending: Student in an Organized Health Care Education/Training Program

## 2024-02-25 ENCOUNTER — Other Ambulatory Visit: Payer: Self-pay

## 2024-02-25 DIAGNOSIS — D649 Anemia, unspecified: Secondary | ICD-10-CM

## 2024-02-25 DIAGNOSIS — I4821 Permanent atrial fibrillation: Secondary | ICD-10-CM | POA: Insufficient documentation

## 2024-02-25 DIAGNOSIS — Z8719 Personal history of other diseases of the digestive system: Secondary | ICD-10-CM

## 2024-02-25 DIAGNOSIS — K922 Gastrointestinal hemorrhage, unspecified: Secondary | ICD-10-CM

## 2024-02-25 DIAGNOSIS — K625 Hemorrhage of anus and rectum: Secondary | ICD-10-CM

## 2024-02-25 LAB — CBC WITH DIFFERENTIAL/PLATELET
Basophils Absolute: 0 x10E3/uL (ref 0.0–0.2)
Basos: 1 %
EOS (ABSOLUTE): 0 x10E3/uL (ref 0.0–0.4)
Eos: 1 %
Hematocrit: 29.3 % — ABNORMAL LOW (ref 37.5–51.0)
Hemoglobin: 9.6 g/dL — ABNORMAL LOW (ref 13.0–17.7)
Immature Grans (Abs): 0 x10E3/uL (ref 0.0–0.1)
Immature Granulocytes: 0 %
Lymphocytes Absolute: 0.3 x10E3/uL — ABNORMAL LOW (ref 0.7–3.1)
Lymphs: 9 %
MCH: 34.3 pg — ABNORMAL HIGH (ref 26.6–33.0)
MCHC: 32.8 g/dL (ref 31.5–35.7)
MCV: 105 fL — ABNORMAL HIGH (ref 79–97)
Monocytes Absolute: 0.1 x10E3/uL (ref 0.1–0.9)
Monocytes: 3 %
Neutrophils Absolute: 2.4 x10E3/uL (ref 1.4–7.0)
Neutrophils: 86 %
Platelets: 403 x10E3/uL (ref 150–450)
RBC: 2.8 x10E6/uL — ABNORMAL LOW (ref 4.14–5.80)
RDW: 17.4 % — ABNORMAL HIGH (ref 11.6–15.4)
WBC: 2.8 x10E3/uL — ABNORMAL LOW (ref 3.4–10.8)

## 2024-02-25 MED ORDER — IOHEXOL 350 MG/ML SOLN
80.0000 mL | Freq: Once | INTRAVENOUS | Status: AC | PRN
  Administered 2024-02-25: 14:00:00 80 mL via INTRAVENOUS

## 2024-02-25 NOTE — Telephone Encounter (Signed)
 Very large appendage Max 33/ AVG 31/ Depth 20 Likely use a 35mm or 40 mm device Inf/Mid TSP RAO 15 CAU 1

## 2024-02-26 ENCOUNTER — Telehealth: Payer: Self-pay

## 2024-02-26 ENCOUNTER — Ambulatory Visit (HOSPITAL_BASED_OUTPATIENT_CLINIC_OR_DEPARTMENT_OTHER): Payer: Self-pay | Admitting: Family

## 2024-02-26 LAB — BASIC METABOLIC PANEL WITH GFR
BUN/Creatinine Ratio: 17 (ref 10–24)
BUN: 30 mg/dL — ABNORMAL HIGH (ref 8–27)
CO2: 16 mmol/L — ABNORMAL LOW (ref 20–29)
Calcium: 9.6 mg/dL (ref 8.6–10.2)
Chloride: 106 mmol/L (ref 96–106)
Creatinine, Ser: 1.81 mg/dL — ABNORMAL HIGH (ref 0.76–1.27)
Glucose: 91 mg/dL (ref 70–99)
Potassium: 4.5 mmol/L (ref 3.5–5.2)
Sodium: 138 mmol/L (ref 134–144)
eGFR: 38 mL/min/1.73 — ABNORMAL LOW (ref >59)

## 2024-02-26 LAB — LDL CHOLESTEROL, DIRECT: LDL Direct: 64 mg/dL (ref 0–99)

## 2024-02-26 NOTE — Telephone Encounter (Signed)
 Brent Ray

## 2024-02-26 NOTE — Telephone Encounter (Signed)
 Spoke with patient's wife. Confirmed procedure date of 12/18. Confirmed arrival time of 1015 for procedure time at 1245. Reviewed pre-procedure instructions with patient. Contrast allergy? No PPM or defibrillator? Yes, [employer] CRT-D. Rep notified.  Wife reports patient is feeling well with no symptoms of chest pain or SOB. He has had no changes since being seen in the office by Dr. Almetta.  The patient's wife understands to call if questions/concerns arise prior to procedure. The patient's wife was grateful for call and agreed with plan.

## 2024-02-27 NOTE — Progress Notes (Signed)
 Remote ICD Transmission

## 2024-02-28 ENCOUNTER — Encounter (HOSPITAL_COMMUNITY): Payer: Self-pay | Admitting: Student in an Organized Health Care Education/Training Program

## 2024-02-28 ENCOUNTER — Inpatient Hospital Stay (HOSPITAL_COMMUNITY)
Admission: RE | Disposition: A | Payer: Self-pay | Attending: Student in an Organized Health Care Education/Training Program

## 2024-02-28 ENCOUNTER — Inpatient Hospital Stay (HOSPITAL_COMMUNITY)

## 2024-02-28 ENCOUNTER — Inpatient Hospital Stay (HOSPITAL_COMMUNITY): Admitting: Certified Registered"

## 2024-02-28 ENCOUNTER — Other Ambulatory Visit: Payer: Self-pay

## 2024-02-28 ENCOUNTER — Inpatient Hospital Stay (HOSPITAL_COMMUNITY)
Admission: RE | Admit: 2024-02-28 | Discharge: 2024-02-29 | DRG: 229 | Disposition: A | Discharge status: Home / Self Care | Attending: Student in an Organized Health Care Education/Training Program | Admitting: Student in an Organized Health Care Education/Training Program

## 2024-02-28 DIAGNOSIS — I4821 Permanent atrial fibrillation: Secondary | ICD-10-CM | POA: Diagnosis present

## 2024-02-28 DIAGNOSIS — Z8719 Personal history of other diseases of the digestive system: Secondary | ICD-10-CM

## 2024-02-28 DIAGNOSIS — I4891 Unspecified atrial fibrillation: Secondary | ICD-10-CM | POA: Diagnosis present

## 2024-02-28 DIAGNOSIS — Z8711 Personal history of peptic ulcer disease: Secondary | ICD-10-CM

## 2024-02-28 DIAGNOSIS — I5032 Chronic diastolic (congestive) heart failure: Secondary | ICD-10-CM | POA: Diagnosis present

## 2024-02-28 DIAGNOSIS — Z006 Encounter for examination for normal comparison and control in clinical research program: Secondary | ICD-10-CM | POA: Diagnosis not present

## 2024-02-28 DIAGNOSIS — I130 Hypertensive heart and chronic kidney disease with heart failure and stage 1 through stage 4 chronic kidney disease, or unspecified chronic kidney disease: Secondary | ICD-10-CM | POA: Diagnosis present

## 2024-02-28 DIAGNOSIS — I459 Conduction disorder, unspecified: Secondary | ICD-10-CM | POA: Diagnosis present

## 2024-02-28 DIAGNOSIS — R54 Age-related physical debility: Secondary | ICD-10-CM | POA: Diagnosis present

## 2024-02-28 DIAGNOSIS — Z8673 Personal history of transient ischemic attack (TIA), and cerebral infarction without residual deficits: Secondary | ICD-10-CM

## 2024-02-28 DIAGNOSIS — I480 Paroxysmal atrial fibrillation: Secondary | ICD-10-CM

## 2024-02-28 DIAGNOSIS — I493 Ventricular premature depolarization: Secondary | ICD-10-CM | POA: Diagnosis present

## 2024-02-28 DIAGNOSIS — K922 Gastrointestinal hemorrhage, unspecified: Secondary | ICD-10-CM

## 2024-02-28 DIAGNOSIS — D649 Anemia, unspecified: Secondary | ICD-10-CM

## 2024-02-28 DIAGNOSIS — I4892 Unspecified atrial flutter: Secondary | ICD-10-CM | POA: Diagnosis present

## 2024-02-28 DIAGNOSIS — Z888 Allergy status to other drugs, medicaments and biological substances status: Secondary | ICD-10-CM

## 2024-02-28 DIAGNOSIS — I2510 Atherosclerotic heart disease of native coronary artery without angina pectoris: Secondary | ICD-10-CM

## 2024-02-28 DIAGNOSIS — D6869 Other thrombophilia: Secondary | ICD-10-CM | POA: Diagnosis present

## 2024-02-28 DIAGNOSIS — E1122 Type 2 diabetes mellitus with diabetic chronic kidney disease: Secondary | ICD-10-CM | POA: Diagnosis present

## 2024-02-28 DIAGNOSIS — I428 Other cardiomyopathies: Secondary | ICD-10-CM | POA: Diagnosis present

## 2024-02-28 DIAGNOSIS — N189 Chronic kidney disease, unspecified: Secondary | ICD-10-CM | POA: Diagnosis present

## 2024-02-28 DIAGNOSIS — Z86718 Personal history of other venous thrombosis and embolism: Secondary | ICD-10-CM | POA: Diagnosis not present

## 2024-02-28 DIAGNOSIS — Z539 Procedure and treatment not carried out, unspecified reason: Secondary | ICD-10-CM | POA: Diagnosis present

## 2024-02-28 DIAGNOSIS — K625 Hemorrhage of anus and rectum: Secondary | ICD-10-CM

## 2024-02-28 HISTORY — PX: TRANSESOPHAGEAL ECHOCARDIOGRAM (CATH LAB): EP1270

## 2024-02-28 HISTORY — PX: LEFT ATRIAL APPENDAGE OCCLUSION: EP1229

## 2024-02-28 LAB — ECHO TEE
MV M vel: 4.92 m/s
MV Peak grad: 96.8 mmHg
Radius: 0.7 cm

## 2024-02-28 LAB — TYPE AND SCREEN
ABO/RH(D): O POS
Antibody Screen: NEGATIVE

## 2024-02-28 LAB — POCT ACTIVATED CLOTTING TIME
Activated Clotting Time: 301 s
Activated Clotting Time: 368 s

## 2024-02-28 MED ORDER — ONDANSETRON HCL 4 MG/2ML IJ SOLN
INTRAMUSCULAR | Status: DC | PRN
  Administered 2024-02-28: 14:00:00 4 mg via INTRAVENOUS

## 2024-02-28 MED ORDER — CEFAZOLIN SODIUM-DEXTROSE 2-4 GM/100ML-% IV SOLN
INTRAVENOUS | Status: AC
  Filled 2024-02-28: qty 100

## 2024-02-28 MED ORDER — ROCURONIUM BROMIDE 10 MG/ML (PF) SYRINGE
PREFILLED_SYRINGE | INTRAVENOUS | Status: DC | PRN
  Administered 2024-02-28: 14:00:00 50 mg via INTRAVENOUS
  Administered 2024-02-28: 15:00:00 10 mg via INTRAVENOUS

## 2024-02-28 MED ORDER — PROTAMINE SULFATE 10 MG/ML IV SOLN
INTRAVENOUS | Status: DC | PRN
  Administered 2024-02-28: 16:00:00 40 mg via INTRAVENOUS

## 2024-02-28 MED ORDER — FENTANYL CITRATE (PF) 100 MCG/2ML IJ SOLN
INTRAMUSCULAR | Status: AC
  Filled 2024-02-28: qty 2

## 2024-02-28 MED ORDER — HEPARIN SODIUM (PORCINE) 1000 UNIT/ML IJ SOLN
INTRAMUSCULAR | Status: DC | PRN
  Administered 2024-02-28: 15:00:00 3000 [IU] via INTRAVENOUS
  Administered 2024-02-28: 14:00:00 9000 [IU] via INTRAVENOUS

## 2024-02-28 MED ORDER — CHLORHEXIDINE GLUCONATE 4 % EX SOLN
Freq: Once | CUTANEOUS | Status: DC
  Filled 2024-02-28: qty 15

## 2024-02-28 MED ORDER — CHLORHEXIDINE GLUCONATE 0.12 % MT SOLN
OROMUCOSAL | Status: AC
  Filled 2024-02-28: qty 15

## 2024-02-28 MED ORDER — PROPOFOL 10 MG/ML IV BOLUS
INTRAVENOUS | Status: DC | PRN
  Administered 2024-02-28: 14:00:00 50 mg via INTRAVENOUS
  Administered 2024-02-28: 14:00:00 30 mg via INTRAVENOUS

## 2024-02-28 MED ORDER — FENTANYL CITRATE (PF) 250 MCG/5ML IJ SOLN
INTRAMUSCULAR | Status: DC | PRN
  Administered 2024-02-28 (×2): 25 ug via INTRAVENOUS

## 2024-02-28 MED ORDER — PHENYLEPHRINE HCL-NACL 20-0.9 MG/250ML-% IV SOLN: INTRAVENOUS | Status: DC | PRN

## 2024-02-28 MED ORDER — SODIUM CHLORIDE 0.9 % IV SOLN: INTRAVENOUS | Status: DC

## 2024-02-28 MED ORDER — DEXAMETHASONE SOD PHOSPHATE PF 10 MG/ML IJ SOLN
INTRAMUSCULAR | Status: DC | PRN
  Administered 2024-02-28: 14:00:00 5 mg via INTRAVENOUS

## 2024-02-28 MED ORDER — IOHEXOL 350 MG/ML SOLN: INTRAVENOUS | Status: DC | PRN

## 2024-02-28 MED ORDER — CHLORHEXIDINE GLUCONATE 0.12 % MT SOLN
15.0000 mL | Freq: Once | OROMUCOSAL | Status: AC
  Administered 2024-02-28: 11:00:00 15 mL via OROMUCOSAL

## 2024-02-28 MED ORDER — PHENYLEPHRINE 80 MCG/ML (10ML) SYRINGE FOR IV PUSH (FOR BLOOD PRESSURE SUPPORT)
PREFILLED_SYRINGE | INTRAVENOUS | Status: DC | PRN
  Administered 2024-02-28: 14:00:00 160 ug via INTRAVENOUS

## 2024-02-28 MED ORDER — SUGAMMADEX SODIUM 200 MG/2ML IV SOLN
INTRAVENOUS | Status: DC | PRN
  Administered 2024-02-28: 16:00:00 100 mg via INTRAVENOUS

## 2024-02-28 MED ORDER — IODIXANOL 320 MG/ML IV SOLN
INTRAVENOUS | Status: DC | PRN
  Administered 2024-02-28: 16:00:00 20 mL

## 2024-02-28 MED ORDER — CEFAZOLIN SODIUM-DEXTROSE 2-4 GM/100ML-% IV SOLN
2.0000 g | INTRAVENOUS | Status: AC
  Administered 2024-02-28: 14:00:00 2 g via INTRAVENOUS

## 2024-02-28 MED ORDER — HEPARIN (PORCINE) IN NACL 2000-0.9 UNIT/L-% IV SOLN
INTRAVENOUS | Status: DC | PRN
  Administered 2024-02-28: 14:00:00 1000 mL

## 2024-02-28 MED ORDER — LORAZEPAM 2 MG/ML IJ SOLN: 0.5000 mg | Freq: Once | INTRAMUSCULAR | Status: DC

## 2024-02-28 MED ORDER — LIDOCAINE 2% (20 MG/ML) 5 ML SYRINGE
INTRAMUSCULAR | Status: DC | PRN
  Administered 2024-02-28: 14:00:00 100 mg via INTRAVENOUS

## 2024-02-28 MED ORDER — PHENYLEPHRINE HCL-NACL 20-0.9 MG/250ML-% IV SOLN
INTRAVENOUS | Status: DC | PRN
  Administered 2024-02-28: 15:00:00 15 ug/min via INTRAVENOUS

## 2024-02-28 NOTE — Transfer of Care (Signed)
 Immediate Anesthesia Transfer of Care Note  Patient: Brent Ray.  Procedure(s) Performed: LEFT ATRIAL APPENDAGE OCCLUSION TRANSESOPHAGEAL ECHOCARDIOGRAM  Patient Location: PACU and Cath Lab  Anesthesia Type:General  Level of Consciousness: awake, alert , and oriented  Airway & Oxygen Therapy: Patient Spontanous Breathing and Patient connected to nasal cannula oxygen  Post-op Assessment: Report given to RN and Post -op Vital signs reviewed and stable  Post vital signs: Reviewed and stable  Last Vitals:  Vitals Value Taken Time  BP    Temp    Pulse 68 02/28/24 16:02  Resp 17 02/28/24 16:02  SpO2 100 % 02/28/24 16:02  Vitals shown include unfiled device data.  Last Pain:  Vitals:   02/28/24 1054  TempSrc:   PainSc: 0-No pain      Patients Stated Pain Goal: 0 (02/28/24 1054)  Complications: There were no known notable events for this encounter.

## 2024-02-28 NOTE — Interval H&P Note (Signed)
 History and Physical Interval Note:  02/28/2024 1:46 PM  Brent Ray.  has presented today for surgery, with the diagnosis of Afib.  The various methods of treatment have been discussed with the patient and family. After consideration of risks, benefits and other options for treatment, the patient has consented to  Procedures: LEFT ATRIAL APPENDAGE OCCLUSION (N/A) TRANSESOPHAGEAL ECHOCARDIOGRAM (N/A) as a surgical intervention.  The patient's history has been reviewed, patient examined, no change in status, stable for surgery.  I have reviewed the patient's chart and labs.  Questions were answered to the patient's satisfaction.     Brent Ray

## 2024-02-28 NOTE — Progress Notes (Signed)
°   02/28/24 1815  Vitals  Temp (!) 97.4 F (36.3 C)  Temp Source Oral  BP (!) 153/79  MAP (mmHg) 102  BP Location Right Arm  BP Method Automatic  Patient Position (if appropriate) Lying  Pulse Rate Source Monitor  Resp (!) 8  Level of Consciousness  Level of Consciousness Alert  MEWS COLOR  MEWS Score Color Green  Oxygen Therapy  SpO2 100 %  O2 Device Room Air  Pain Assessment  Pain Scale 0-10  Pain Score 0  PCA/Epidural/Spinal Assessment  Respiratory Pattern Regular;Unlabored  MEWS Score  MEWS Temp 0  MEWS Systolic 0  MEWS Pulse 0  MEWS RR 1  MEWS LOC 0  MEWS Score 1   Patient transferred to 4E, ccmd verified, skin assessed, vitals taken, call bell and phone in reach; patient acclimated to room.

## 2024-02-28 NOTE — Anesthesia Procedure Notes (Signed)
 Procedure Name: Intubation Date/Time: 02/28/2024 2:14 PM  Performed by: Delores Dus, CRNAPre-anesthesia Checklist: Patient identified, Emergency Drugs available, Suction available and Patient being monitored Patient Re-evaluated:Patient Re-evaluated prior to induction Oxygen Delivery Method: Circle system utilized Preoxygenation: Pre-oxygenation with 100% oxygen Induction Type: IV induction Ventilation: Mask ventilation without difficulty Laryngoscope Size: McGrath and 3 Grade View: Grade I Tube type: Oral Tube size: 7.0 mm Number of attempts: 1 Airway Equipment and Method: Stylet and Oral airway Placement Confirmation: ETT inserted through vocal cords under direct vision, positive ETCO2 and breath sounds checked- equal and bilateral Secured at: 22 cm Tube secured with: Tape Dental Injury: Teeth and Oropharynx as per pre-operative assessment

## 2024-02-28 NOTE — Anesthesia Preprocedure Evaluation (Addendum)
 Anesthesia Evaluation  Patient identified by MRN, date of birth, ID band Patient awake    Reviewed: Allergy & Precautions, NPO status , Patient's Chart, lab work & pertinent test results  History of Anesthesia Complications Negative for: history of anesthetic complications  Airway Mallampati: I  TM Distance: <3 FB Neck ROM: Full    Dental  (+) Teeth Intact, Dental Advisory Given   Pulmonary sleep apnea    breath sounds clear to auscultation       Cardiovascular hypertension, + CAD, + Peripheral Vascular Disease (L ICA occlusion) and +CHF  + dysrhythmias (1st degree AV Block) Atrial Fibrillation and Supra Ventricular Tachycardia + pacemaker + Cardiac Defibrillator  Rhythm:Irregular Rate:Normal  TTE (01/03/24):  1. Left ventricular ejection fraction, by estimation, is 55%. The left  ventricle has normal function. Left ventricular endocardial border not  optimally defined to evaluate regional wall motion. Left ventricular  diastolic function could not be evaluated.   2. Right ventricular systolic function is normal. The right ventricular  size is normal. There is normal pulmonary artery systolic pressure.   3. Left atrial size was severely dilated.   4. Right atrial size was severely dilated.   5. The mitral valve is abnormal. 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 No evidence of mitral stenosis. There is mild prolapse of  posterior leaflet of the mitral valve. The mean mitral valve gradient is  1.0 mmHg.   6. Two TR jets seen, mild in intensity.   7. The aortic valve is tricuspid. Aortic valve regurgitation is not  visualized. No aortic stenosis is present. Aortic valve mean gradient  measures 2.0 mmHg.   8. The inferior vena cava is normal in size with greater than 50%  respiratory variability, suggesting right atrial pressure of 3 mmHg.     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)    GI/Hepatic PUD,GERD  Medicated and Controlled,,Hx of GI Bleed s/p blood transfusion (12/2023)   Endo/Other    Renal/GU Renal InsufficiencyRenal disease     Musculoskeletal  (+) Arthritis , Rheumatoid disorders,    Abdominal   Peds  Hematology  (+) Blood dyscrasia, anemia Hgb 9.6, Plts 403K (02/25/24)   Anesthesia Other Findings   Reproductive/Obstetrics                              Anesthesia Physical Anesthesia Plan  ASA: 3  Anesthesia Plan: General   Post-op Pain Management:    Induction: Intravenous  PONV Risk Score and Plan: 2 and Ondansetron , Dexamethasone  and Treatment may vary due to age or medical condition  Airway Management Planned: Oral ETT  Additional Equipment: None  Intra-op Plan:   Post-operative Plan: Extubation in OR  Informed Consent: I have reviewed the patients History and Physical, chart, labs and discussed the procedure including the risks, benefits and alternatives for the proposed anesthesia with the patient or authorized representative who has indicated his/her understanding and acceptance.     Dental advisory given  Plan Discussed with: CRNA  Anesthesia Plan Comments:          Anesthesia Quick Evaluation

## 2024-02-28 NOTE — Op Note (Signed)
° °  Procedure:  Watchman FLX Pro implant (CPT [id number])   Indication: recurrent GIB, prior CVA   Attending: Adina Primus, MD    HAS-BLED score 3 Hypertension No  Abnormal renal and liver function (Dialysis, transplant, Cr >2.26 mg/dL /Cirrhosis or Bilirubin >2x Normal or AST/ALT/AP >3x Normal) No  Stroke Yes  Bleeding Yes  Labile INR (Unstable/high INR) No  Elderly (>65) Yes  Drugs or alcohol  (>= 8 drinks/week, anti-plt or NSAID) No    CHA2DS2-VASc Score = 5  The patient's score is based upon: CHF History: 1 HTN History: 1 Diabetes History: 0 Stroke History: 0 Vascular Disease History: 1 Age Score: 2 Gender Score: 0   NYHA class I    Procedure Summary: Vascular ultrasound and standard access needle used to access the right femoral vein. Perclose x2 for preclosure used for venotomy site.  A 7 Fr short sheath was inserted into the RFV and exchanged for a 17 Fr OD WATCHMAN TruSteer outer sheath with [employer] was advanced to the mid inferior septum.  TEE examination of the left atrial appendage was reviewed in multiple views. Maximum LAA ostial diameter 28 mm. Maximum depth was 21 mm. Based upon these images a 35 mm WATCHMAN FLX Pro device was selected. Heparinization with an ACT >350 seconds. Successful inferior and mid transeptal catheterization using the VersaCross Connect radiofrequency needle with ICE and fluoroscopic guidance.   LA pressure was 13 mmHg.   We placed the wire in the left upper pulmonary vein, Versa Cross sheath pulled back into RA, and ICE was advanced into the LA over the guidewire. The outer Watchman sheath was advanced over the RF wire to the mid LAA. Multiple ICE views obtained to exclude thrombus and determine sizing of the Watchman device.  Contrast injection through the Watchman outer sheath in multiple views demonstrated a large chicken wing morphology with a circular os. Contast injections confirmed the ICE measurements.   The 35 mm WATCHMAN FLX  Pro device was deployed. There was a large mitral shoulder and significant pectinate preventing deeper placement of the device. The device was angled towards to LAA tip however there remained a large mitral shoulder. The 35 mm device was exchanged for a 31 mm device. The 31 mm device was deployed and had a large gap at the limbus with color flow. Despite multiple deployments with different angulations there remained variable gaps at the limbus. Ultimately it was felt that the 31 mm device was too small to adequately close the LAA. A second 35 mm device was exchanged for the 31 mm device. Despite multiple attempts to reposition the device the L Cx os was mid way down the device and the skirt was not deep enough to ensure LAA coverage.  Ultimately the procedure was aborted after numerous attempts at placement of both the 31 mm and 35 mm devices.  Final ICE examination revealed no effusion   Protamine  was given. Perclose ProStyle x2 utilized for hemostasis.   Summary: 1. Unsuccessful implant of a Watchman FLX Pro    Recommendations: IP only procedure Continue OAC with Eliquis  2.5 mg bid (33M, 51.7 kg, sCr 1.8)  Refer to Dr. Franky Mace ([facility]) for consideration of Amulet implant  EP f/u to be scheduled   Brent DELENA Primus, MD [facility]  Cardiac Electrophysiology

## 2024-02-28 NOTE — Discharge Summary (Signed)
 Electrophysiology Discharge Summary   Patient ID: Brent Nuttall.,  MRN: [id number], DOB/AGE: 76-May-1947 76 y.o.  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   Hx of L subclavian vein thrombosis 7.   GIB   Procedures This Admission:  Aborted watchman implant This study demonstrated: ***    Brief HPI: Brent Maler. is a 78 y.o. male with a history of Permanent Atrial Fibrillation and atypical AFlutter, hx of multiple ablations,  was referred to To Dr. Almetta for consideration of LAAO.   Hospital Course:  The patient was admitted, *** LAAO was aborted 2/2 ***  {Blank single:19197::The patient was monitored in the post procedure setting and has done very well with no concerns. Given this, he/she is being considered for same day discharge later today.,The patient was monitored overnight and has done very well with no concerns.} Groin site has been stable without evidence of hematoma or bleeding. Wound care and restrictions were reviewed with the patient.   The patient has been scheduled for post procedure follow up with EP APP in approximately 6 weeks. They will restart {Blank single:19197::Eliquis ,Xarelto ,Coumadin} this evening and continue for 45 days then stop. At that time he will transition to Plavix 75mg  daily to complete 6 months of therapy. They will require dental SBE for 6 month post op and should refrain from dental work or cleanings for the first 45 days post  implant. SBE to be RXd at follow up.   A repeat {Blank single:19197::CT scan,TEE (due to CKD)} will be performed in approximately 60 days to ensure proper seal of the device.    Physical Exam: Vitals:   02/28/24 1044  BP: (!) 149/71  Pulse: 70  Resp: 18  Temp: 97.7 F (36.5 C)  TempSrc: Oral  SpO2: 100%  Weight: 51.7 kg  Height: 5' 8 (1.727 m)    GEN: Well nourished, well developed in no acute distress NECK: No JVD; No carotid bruits CARDIAC: {EPRHYTHM:28826}, no murmurs, rubs, gallops RESPIRATORY:  Clear to auscultation without rales, wheezing or rhonchi  ABDOMEN: Soft, non-tender, non-distended EXTREMITIES:  No edema; No deformity. Groin site {Blank single:19197::Stable}     Discharge Medications:  Allergies as of 02/28/2024       Reactions   Flecainide  Other (See Comments)   Severe headaches, blurred vision   Flomax [tamsulosin] Hives, Itching, Dermatitis, Rash   Pacerone [amiodarone] Other (See Comments)   Blindness/vision changes Arthralgias    Aldactone  [spironolactone ] Swelling, Other (See Comments)   Breast swelling Headaches   Coreg  [carvedilol ] Other (See Comments)   Unknown reaction   Entresto [sacubitril-valsartan] Nausea And Vomiting   Farxiga [dapagliflozin] Diarrhea   Inspra [eplerenone] Other (See Comments)   Unknown reaction   Lopressor  [metoprolol ] Other (See Comments)   Headaches Dizziness    Zestril  [lisinopril ] Nausea And Vomiting   Ferosul [ferrous Sulfate ] Other (See Comments)   Bleeding hemorrhoids secondary to severe constipation   Azulfidine [sulfasalazine] Nausea Only   Statins Other (See Comments)   Myalgias      Med Rec must  be completed prior to using this SMARTLINK***       Disposition:  Home with usual follow up as in AVS  Duration of Discharge Encounter:  APP Time: ***  Signed, ***

## 2024-02-29 ENCOUNTER — Ambulatory Visit: Payer: Self-pay | Admitting: Cardiovascular Disease

## 2024-02-29 ENCOUNTER — Telehealth: Payer: Self-pay

## 2024-02-29 ENCOUNTER — Encounter (HOSPITAL_COMMUNITY): Payer: Self-pay | Admitting: Student in an Organized Health Care Education/Training Program

## 2024-02-29 DIAGNOSIS — I4821 Permanent atrial fibrillation: Principal | ICD-10-CM

## 2024-02-29 MED ORDER — APIXABAN 2.5 MG PO TABS
2.5000 mg | ORAL_TABLET | Freq: Two times a day (BID) | ORAL | Status: DC
  Administered 2024-02-29: 2.5 mg via ORAL
  Filled 2024-02-29: qty 1

## 2024-02-29 MED ORDER — TRAMADOL HCL 50 MG PO TABS: 50.0000 mg | ORAL_TABLET | Freq: Four times a day (QID) | ORAL | Status: DC | PRN

## 2024-02-29 MED ORDER — MEXILETINE HCL 150 MG PO CAPS
150.0000 mg | ORAL_CAPSULE | Freq: Two times a day (BID) | ORAL | Status: DC
  Administered 2024-02-29: 150 mg via ORAL
  Filled 2024-02-29: qty 1

## 2024-02-29 MED FILL — Fentanyl Citrate Preservative Free (PF) Inj 100 MCG/2ML: INTRAMUSCULAR | Qty: 1 | Status: AC

## 2024-02-29 NOTE — Telephone Encounter (Addendum)
 Patient was aborted Watchman 02/28/2024. Referral to [facility] (Dr. Franky Mace) for Amulet consideration per Dr. Garnette op note.

## 2024-03-03 ENCOUNTER — Telehealth: Payer: Self-pay

## 2024-03-03 ENCOUNTER — Encounter: Payer: Self-pay | Admitting: Cardiology

## 2024-03-03 NOTE — Telephone Encounter (Signed)
" °  [facility]   Watchman Team  Contacted the patient regarding discharge from [facility] on 02/29/2024. He was aborted Watchman.  The patient understands to follow up with Dr. Almetta on 05/06/2024. Patient aware Dr. Almetta reached out to [facility] regarding Amulet referral. He will reach out if there are concerns.   The patient understands discharge instructions? Yes  The patient understands medications and regimen? Yes   The patient reports groin sites are healing well with no pain or drainage.  The patient reports slight nose bleed this morning that is resolved. He has concerns about weight loss despite good appetite. Advised him to reach out to PCP/GI doctor.  The patient understands to call with any questions or concerns prior to scheduled visit.   The patient was grateful for call and agreed with plan.   "

## 2024-03-03 NOTE — Anesthesia Postprocedure Evaluation (Signed)
"   Anesthesia Post Note  Patient: Brent Ray.  Procedure(s) Performed: LEFT ATRIAL APPENDAGE OCCLUSION TRANSESOPHAGEAL ECHOCARDIOGRAM     Patient location during evaluation: PACU Anesthesia Type: General Level of consciousness: awake Pain management: pain level controlled Vital Signs Assessment: post-procedure vital signs reviewed and stable Respiratory status: spontaneous breathing Cardiovascular status: blood pressure returned to baseline Postop Assessment: no apparent nausea or vomiting Anesthetic complications: no   There were no known notable events for this encounter.                Lauraine DASEN Colhoun      "

## 2024-03-04 ENCOUNTER — Other Ambulatory Visit: Payer: Self-pay

## 2024-03-04 ENCOUNTER — Emergency Department (HOSPITAL_COMMUNITY)

## 2024-03-04 ENCOUNTER — Encounter (HOSPITAL_COMMUNITY): Payer: Self-pay

## 2024-03-04 ENCOUNTER — Emergency Department (HOSPITAL_COMMUNITY)
Admission: EM | Admit: 2024-03-04 | Discharge: 2024-03-04 | Disposition: A | Discharge status: Home / Self Care | Attending: Emergency Medicine | Admitting: Emergency Medicine

## 2024-03-04 DIAGNOSIS — I482 Chronic atrial fibrillation, unspecified: Secondary | ICD-10-CM | POA: Insufficient documentation

## 2024-03-04 DIAGNOSIS — K5900 Constipation, unspecified: Secondary | ICD-10-CM | POA: Insufficient documentation

## 2024-03-04 DIAGNOSIS — I509 Heart failure, unspecified: Secondary | ICD-10-CM | POA: Insufficient documentation

## 2024-03-04 DIAGNOSIS — N189 Chronic kidney disease, unspecified: Secondary | ICD-10-CM | POA: Insufficient documentation

## 2024-03-04 DIAGNOSIS — I130 Hypertensive heart and chronic kidney disease with heart failure and stage 1 through stage 4 chronic kidney disease, or unspecified chronic kidney disease: Secondary | ICD-10-CM | POA: Insufficient documentation

## 2024-03-04 DIAGNOSIS — Z85828 Personal history of other malignant neoplasm of skin: Secondary | ICD-10-CM | POA: Insufficient documentation

## 2024-03-04 DIAGNOSIS — Z7901 Long term (current) use of anticoagulants: Secondary | ICD-10-CM | POA: Insufficient documentation

## 2024-03-04 DIAGNOSIS — I2510 Atherosclerotic heart disease of native coronary artery without angina pectoris: Secondary | ICD-10-CM | POA: Diagnosis not present

## 2024-03-04 DIAGNOSIS — Z96642 Presence of left artificial hip joint: Secondary | ICD-10-CM | POA: Diagnosis not present

## 2024-03-04 DIAGNOSIS — Z8673 Personal history of transient ischemic attack (TIA), and cerebral infarction without residual deficits: Secondary | ICD-10-CM | POA: Insufficient documentation

## 2024-03-04 DIAGNOSIS — Z79899 Other long term (current) drug therapy: Secondary | ICD-10-CM | POA: Insufficient documentation

## 2024-03-04 DIAGNOSIS — R109 Unspecified abdominal pain: Secondary | ICD-10-CM | POA: Diagnosis present

## 2024-03-04 LAB — CBC WITH DIFFERENTIAL/PLATELET
Abs Immature Granulocytes: 0.07 K/uL (ref 0.00–0.07)
Basophils Absolute: 0 K/uL (ref 0.0–0.1)
Basophils Relative: 1 %
Eosinophils Absolute: 0 K/uL (ref 0.0–0.5)
Eosinophils Relative: 1 %
HCT: 28.7 % — ABNORMAL LOW (ref 39.0–52.0)
Hemoglobin: 9.2 g/dL — ABNORMAL LOW (ref 13.0–17.0)
Immature Granulocytes: 1 %
Lymphocytes Relative: 9 %
Lymphs Abs: 0.8 K/uL (ref 0.7–4.0)
MCH: 34.2 pg — ABNORMAL HIGH (ref 26.0–34.0)
MCHC: 32.1 g/dL (ref 30.0–36.0)
MCV: 106.7 fL — ABNORMAL HIGH (ref 80.0–100.0)
Monocytes Absolute: 1 K/uL (ref 0.1–1.0)
Monocytes Relative: 11 %
Neutro Abs: 6.9 K/uL (ref 1.7–7.7)
Neutrophils Relative %: 77 %
Platelets: 275 K/uL (ref 150–400)
RBC: 2.69 MIL/uL — ABNORMAL LOW (ref 4.22–5.81)
RDW: 20.4 % — ABNORMAL HIGH (ref 11.5–15.5)
WBC: 8.8 K/uL (ref 4.0–10.5)
nRBC: 0 % (ref 0.0–0.2)

## 2024-03-04 LAB — COMPREHENSIVE METABOLIC PANEL WITH GFR
ALT: 11 U/L (ref 0–44)
AST: 24 U/L (ref 15–41)
Albumin: 3.9 g/dL (ref 3.5–5.0)
Alkaline Phosphatase: 90 U/L (ref 38–126)
Anion gap: 17 — ABNORMAL HIGH (ref 5–15)
BUN: 27 mg/dL — ABNORMAL HIGH (ref 8–23)
CO2: 16 mmol/L — ABNORMAL LOW (ref 22–32)
Calcium: 9.4 mg/dL (ref 8.9–10.3)
Chloride: 107 mmol/L (ref 98–111)
Creatinine, Ser: 1.88 mg/dL — ABNORMAL HIGH (ref 0.61–1.24)
GFR, Estimated: 37 mL/min — ABNORMAL LOW
Glucose, Bld: 123 mg/dL — ABNORMAL HIGH (ref 70–99)
Potassium: 4.4 mmol/L (ref 3.5–5.1)
Sodium: 140 mmol/L (ref 135–145)
Total Bilirubin: 0.5 mg/dL (ref 0.0–1.2)
Total Protein: 6.5 g/dL (ref 6.5–8.1)

## 2024-03-04 LAB — LIPASE, BLOOD: Lipase: 39 U/L (ref 11–51)

## 2024-03-04 MED ORDER — SODIUM CHLORIDE 0.9 % IV BOLUS
500.0000 mL | Freq: Once | INTRAVENOUS | Status: AC
  Administered 2024-03-04: 500 mL via INTRAVENOUS

## 2024-03-04 MED ORDER — IOHEXOL 300 MG/ML  SOLN
75.0000 mL | Freq: Once | INTRAMUSCULAR | Status: AC | PRN
  Administered 2024-03-04: 75 mL via INTRAVENOUS

## 2024-03-04 MED ORDER — ONDANSETRON HCL 4 MG/2ML IJ SOLN
4.0000 mg | Freq: Once | INTRAMUSCULAR | Status: AC
  Administered 2024-03-04: 4 mg via INTRAVENOUS
  Filled 2024-03-04: qty 2

## 2024-03-04 NOTE — ED Notes (Addendum)
 Pt returned to the bathroom via wheelchair. Still no stool produced.

## 2024-03-04 NOTE — ED Notes (Addendum)
 Pt spouse reports pt had good large BM Dr Elnor made aware.

## 2024-03-04 NOTE — ED Notes (Signed)
 Pt to bathroom- feels like need to have BM

## 2024-03-04 NOTE — ED Triage Notes (Signed)
 Pt to ED with c/o abd and rectal pain, pt says he hasn't had BM since last Thursday other than small balls yesterday. Pt reports taking stool softer and enema without relief.

## 2024-03-04 NOTE — ED Provider Notes (Signed)
 " [place] EMERGENCY DEPARTMENT AT [facility] Provider Note  CSN: [id number] Arrival date & time: 03/04/24 1918  Chief Complaint(s) Constipation  HPI Murtaza Shell. is a 78 y.o. male with past medical history as below, significant for atrial fibrillation, Barrett's esophagus, CHF, CKD, CVA, HLD, prediabetes who presents to the ED with complaint of constipation, abdominal pain  History of recurrent constipation, he has had reduced bowel activity over the past few days.  Last regular bowel movement was on Thursday, he had a small bowel movement yesterday which was small.  Having some abdominal discomfort and tightness.  Feeling nauseated without vomiting.  Having some pain with attempted defecation.  He tried oral stool softener which did not provide a bowel movement.  He attempted to use an enema but could not get the enema properly inserted.  History of similar in the past  Past Medical History Past Medical History:  Diagnosis Date   A-fib [facility])    AICD (automatic cardioverter/defibrillator) present    Barrett's esophagus 2011   In [place] . Records in Epic   CHF (congestive heart failure) (HCC)    Chronic kidney disease    Coronary atherosclerosis of native coronary artery    Nonobstructive   CVA (cerebral vascular accident) (HCC) 01/2024   Dyspnea    Essential hypertension, benign    GERD (gastroesophageal reflux disease)    Heart block AV first degree    Chronic   History of kidney stones    Hypercholesterolemia    Mixed hyperlipidemia    Myocardial infarction [facility])    NSVT (nonsustained ventricular tachycardia) (HCC)    Holter monitor, 2011; recommended RF ablation by Dr. 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) 01/04/2024   Presence of cardiac resynchronization therapy defibrillator (CRT-D) 01/04/2024   Long term (current) use of anticoagulants 01/04/2024   Severe mitral regurgitation 01/04/2024   Anemia 01/04/2024   Protein-calorie malnutrition, severe 01/04/2024   Gastric erosion 01/04/2024   History of stroke 01/02/2024   Macrocytic anemia 01/02/2024   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   Internal carotid artery occlusion, 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   Dizziness 08/21/2023   Metabolic acidosis 07/31/2023   Macrocytosis 07/31/2023   Shortness of breath 07/31/2023   Rheumatism 07/31/2023   Peptic ulcer disease 07/31/2023   Dyslipidemia 07/31/2023   Preop cardiovascular exam 04/02/2023   AKI (acute kidney injury) 04/02/2023   Chronic systolic CHF (congestive heart failure) (HCC) 01/19/2023   Paroxysmal atrial flutter (HCC) 01/19/2023   Drug-induced gynecomastia 01/19/2023   Moderate mitral regurgitation 01/19/2023   Moderate tricuspid regurgitation 01/19/2023   History of GI bleed 01/19/2023   CKD (chronic kidney disease) stage 3, GFR 30-59 ml/min (HCC) 01/19/2023   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   Avascular necrosis of hip, 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/11/2022   Dysphagia 12/08/2019   Abdominal pain 12/08/2019   Rectal bleeding 12/08/2019   Persistent atrial fibrillation (HCC) 02/29/2016  History of panic attacks 02/29/2016   Second degree AV block 02/01/2016   D-dimer, elevated    Permanent atrial fibrillation (HCC)    NICM (nonischemic cardiomyopathy) (HCC)    Anticoagulated    Ischemic chest pain 12/13/2015   Fatigue 11/09/2014   Internal hemorrhoids 10/31/2013   Esophageal reflux 10/31/2013   CAD in native  artery    Sinus bradycardia    Heart block AV first degree    NSVT (nonsustained ventricular tachycardia) (HCC)    Mixed hyperlipidemia    Bradycardia 08/21/2012   Chest pain 01/21/2012   PAF (paroxysmal atrial fibrillation) (HCC) 04/11/2010   PALPITATIONS 03/02/2010   Frequent PVCs 02/01/2010   Rheumatoid arthritis (HCC) 02/01/2010   Essential hypertension, benign 01/31/2010   Home Medication(s) Prior to Admission medications  Medication Sig Start Date End Date Taking? Authorizing Provider  acetaminophen  (TYLENOL ) 500 MG tablet Take 1,000 mg by mouth 2 (two) times daily as needed for fever or headache (pain).    [provider]  apixaban  (ELIQUIS ) 2.5 MG TABS tablet Take 1 tablet (2.5 mg total) by mouth 2 (two) times daily. 01/15/24   Lehner, Erin C, NP  bisacodyl (DULCOLAX) 10 MG suppository Place 10 mg rectally daily as needed for severe constipation.    [provider]  folic acid  (FOLVITE ) 1 MG tablet Take 1 mg by mouth See admin instructions. Take 1 tablet (1mg ) by mouth daily, 6 days a week. Do not take folic acid  on Friday. 12/13/22   [provider]  furosemide  (LASIX ) 20 MG tablet Take 1 tablet (20 mg total) by mouth 2 (two) times daily. 01/08/24   Milford, Harlene HERO, FNP  hydrocortisone  (ANUSOL -HC) 2.5 % rectal cream Place 1 Application rectally 2 (two) times daily. 01/07/24   Dean Clarity, MD  losartan  (COZAAR ) 25 MG tablet Take 0.5 tablets (12.5 mg total) by mouth at bedtime. Patient taking differently: Take 25 mg by mouth at bedtime. 01/08/24   Glena Harlene HERO, FNP  methotrexate  (RHEUMATREX) 2.5 MG tablet Take 15 mg by mouth every Friday.    [provider]  mexiletine (MEXITIL ) 150 MG capsule Take 1 capsule (150 mg total) by mouth 2 (two) times daily. 01/16/24   Mealor, Augustus E, MD  NITROSTAT  0.4 MG SL tablet DISSOLVE 1 TABLET UNDER TONGUE EVERY 5 MINUTES AS NEEDED FOR CHEST PAIN 05/11/14   Debera Jayson MATSU, MD  NONFORMULARY OR  COMPOUNDED ITEM Use as directed 5 mLs in the mouth or throat 4 (four) times daily as needed (mouth pain). Pharmacy compounded Magic Mouthwash:  unknown quantities of nystatin/hydrocortisone /lidocaine /diphenhydramine  Swish and spit 5mL up to 4 times daily as needed for mouth pain.    [provider]  pantoprazole  (PROTONIX ) 40 MG tablet Take 1 tablet (40 mg total) by mouth 2 (two) times daily before a meal. Patient taking differently: Take 40 mg by mouth 2 (two) times daily as needed (Heartburn). 01/06/24 03/06/24  Pokhrel, Laxman, MD  potassium chloride  SA (KLOR-CON  M) 20 MEQ tablet Take 40 meq in am and 20 meq in pm Patient taking differently: Take 40 meq in am and 40 meq in pm 01/28/24   Milford, Harlene HERO, FNP  senna-docusate (SENOKOT-S) 8.6-50 MG tablet Take 2 tablets by mouth at bedtime. Patient taking differently: Take 2 tablets by mouth at bedtime as needed for mild constipation or moderate constipation. 01/06/24 04/05/24  Pokhrel, Laxman, MD  sodium bicarbonate  650 MG tablet Take 650 mg by mouth in the morning.    [provider]  traMADol  (ULTRAM ) 50 MG tablet Take 50 mg by mouth every 6 (six) hours as needed for moderate pain (pain score 4-6).    [provider]                                                                                                                                    Past Surgical History Past Surgical History:  Procedure Laterality Date   CARDIAC CATHETERIZATION N/A 12/13/2015   Procedure: Left Heart Cath and Coronary Angiography;  Surgeon: Dorn JINNY Lesches, MD;  Location: [facility] INVASIVE CV LAB;  Service: Cardiovascular;  Laterality: N/A;   CARDIOVERSION N/A 04/11/2016   Procedure: CARDIOVERSION;  Surgeon: Annabella Scarce, MD;  Location: [facility] ENDOSCOPY;  Service: Cardiovascular;  Laterality: N/A;   COLONOSCOPY  03/13/2012   [place], [place]--Dr. Spainhour   EP IMPLANTABLE DEVICE N/A 02/01/2016   Procedure: Pacemaker Implant;  Surgeon: Lynwood Rakers, MD;  Location: [facility] INVASIVE CV LAB;  Service: Cardiovascular;  Laterality: N/A;   ESOPHAGOGASTRODUODENOSCOPY  03/13/2012   [place], [place]--Dr. Spainhour   ESOPHAGOGASTRODUODENOSCOPY N/A 01/04/2024   Procedure: EGD (ESOPHAGOGASTRODUODENOSCOPY);  Surgeon: Shaaron Lamar HERO, MD;  Location: [facility] ENDO SUITE;  Service: Endoscopy;  Laterality: N/A;   HERNIA REPAIR     2   INSERT / REPLACE / REMOVE PACEMAKER     IR ANGIO INTRA EXTRACRAN SEL COM CAROTID INNOMINATE BILAT MOD SED  08/27/2023   IR ANGIO VERTEBRAL SEL VERTEBRAL UNI L MOD SED  08/27/2023   IR CT HEAD LTD  01/12/2024   IR PERCUTANEOUS ART THROMBECTOMY/INFUSION INTRACRANIAL INC DIAG ANGIO  01/12/2024   IR US  GUIDE VASC ACCESS RIGHT  01/12/2024   KIDNEY STONE SURGERY     LEFT ATRIAL APPENDAGE OCCLUSION N/A 02/28/2024   Procedure: LEFT ATRIAL APPENDAGE OCCLUSION;  Surgeon: Almetta Donnice LABOR, MD;  Location: [facility] INVASIVE CV LAB;  Service: Cardiovascular;  Laterality: N/A;   LEFT HEART CATHETERIZATION WITH CORONARY ANGIOGRAM N/A 08/20/2012   Procedure: LEFT HEART CATHETERIZATION WITH CORONARY ANGIOGRAM;  Surgeon: Lynwood Schilling, MD;  Location: [facility] CATH LAB;  Service: Cardiovascular;  Laterality: N/A;   Left leg surgery  03/13/1977   RADIOLOGY WITH ANESTHESIA N/A 08/27/2023   Procedure: RADIOLOGY WITH ANESTHESIA;  Surgeon: Dolphus Carrion, MD;  Location: [facility] OR;  Service: Radiology;  Laterality: N/A;   RADIOLOGY WITH ANESTHESIA N/A 01/12/2024   Procedure: RADIOLOGY WITH ANESTHESIA;  Surgeon: Radiologist, Medication, MD;  Location: [facility] OR;  Service: Radiology;  Laterality: N/A;   SKIN CANCER EXCISION     TOTAL HIP ARTHROPLASTY Left 01/11/2022   Procedure: TOTAL HIP ARTHROPLASTY ANTERIOR APPROACH;  Surgeon: Fidel Rogue, MD;  Location: [facility] ORS;  Service: Orthopedics;  Laterality: Left;  150   TRANSESOPHAGEAL ECHOCARDIOGRAM (CATH LAB) N/A 02/28/2024   Procedure: TRANSESOPHAGEAL ECHOCARDIOGRAM;  Surgeon: Almetta Donnice LABOR, MD;  Location: [facility]  INVASIVE CV LAB;  Service: Cardiovascular;  Laterality: N/A;   Family History Family History  Problem Relation Age of  Onset   Colon cancer Mother    Other Sister         AGE-76-HEALTHY   Other Sister        AGE 15-HEALTHY   Other Sister        AGE 37 HEALTHY   Other Son        AGE 8 HEALTHY    Social History Social History[1] Allergies Flecainide , Flomax [tamsulosin], Pacerone [amiodarone], Aldactone  [spironolactone ], Coreg  [carvedilol ], Entresto [sacubitril-valsartan], Farxiga [dapagliflozin], Inspra [eplerenone], Lopressor  [metoprolol ], Zestril  [lisinopril ], Ferosul [ferrous sulfate ], Azulfidine [sulfasalazine], and Statins  Review of Systems A thorough review of systems was obtained and all systems are negative except as noted in the HPI and PMH.   Physical Exam Vital Signs  I have reviewed the triage vital signs BP 120/70   Pulse 72   Temp 98 F (36.7 C)   Resp 17   Ht 5' 8 (1.727 m)   Wt 51.7 kg   SpO2 100%   BMI 17.33 kg/m  Physical Exam Vitals and nursing note reviewed.  Constitutional:      General: He is not in acute distress.    Appearance: Normal appearance. He is well-developed. He is not ill-appearing.  HENT:     Head: Normocephalic and atraumatic.     Right Ear: External ear normal.     Left Ear: External ear normal.     Nose: Nose normal.     Mouth/Throat:     Mouth: Mucous membranes are moist.  Eyes:     General: No scleral icterus.       Right eye: No discharge.        Left eye: No discharge.  Cardiovascular:     Rate and Rhythm: Normal rate.  Pulmonary:     Effort: Pulmonary effort is normal. No respiratory distress.     Breath sounds: No stridor.  Abdominal:     General: Abdomen is flat. There is no distension.     Palpations: Abdomen is soft.     Tenderness: There is generalized abdominal tenderness. There is no guarding.  Musculoskeletal:        General: No deformity.     Cervical back: No rigidity.  Skin:    General: Skin is  warm and dry.     Coloration: Skin is not cyanotic, jaundiced or pale.  Neurological:     Mental Status: He is alert.  Psychiatric:        Speech: Speech normal.        Behavior: Behavior normal. Behavior is cooperative.     ED Results and Treatments Labs (all labs ordered are listed, but only abnormal results are displayed) Labs Reviewed  CBC WITH DIFFERENTIAL/PLATELET - Abnormal; Notable for the following components:      Result Value   RBC 2.69 (*)    Hemoglobin 9.2 (*)    HCT 28.7 (*)    MCV 106.7 (*)    MCH 34.2 (*)    RDW 20.4 (*)    All other components within normal limits  COMPREHENSIVE METABOLIC PANEL WITH GFR - Abnormal; Notable for the following components:   CO2 16 (*)    Glucose, Bld 123 (*)    BUN 27 (*)    Creatinine, Ser 1.88 (*)    GFR, Estimated 37 (*)    Anion gap 17 (*)    All other components within normal limits  LIPASE, BLOOD  Radiology No results found.  Pertinent labs & imaging results that were available during my care of the patient were reviewed by me and considered in my medical decision making (see MDM for details).  Medications Ordered in ED Medications  ondansetron  (ZOFRAN ) injection 4 mg (4 mg Intravenous Given 03/04/24 2144)  sodium chloride  0.9 % bolus 500 mL (0 mLs Intravenous Stopped 03/04/24 2241)  iohexol  (OMNIPAQUE ) 300 MG/ML solution 75 mL (75 mLs Intravenous Contrast Given 03/04/24 2155)                                                                                                                                     Procedures Procedures  (including critical care time)  Medical Decision Making / ED Course    Medical Decision Making:    Nour Scalise. is a 79 y.o. male with past medical history as below, significant for atrial fibrillation, Barrett's esophagus, CHF, CKD, CVA, HLD, prediabetes  who presents to the ED with complaint of constipation, abdominal pain. The complaint involves an extensive differential diagnosis and also carries with it a high risk of complications and morbidity.  Serious etiology was considered. Ddx includes but is not limited to: Differential diagnosis includes but is not exclusive to acute appendicitis, renal colic, testicular torsion, urinary tract infection, prostatitis,  diverticulitis, small bowel obstruction, colitis, abdominal aortic aneurysm, gastroenteritis, constipation etc.   Complete initial physical exam performed, notably the patient was in no acute distress, resting comfortably.    Reviewed and confirmed nursing documentation for past medical history, family history, social history.  Vital signs reviewed.    Abdominal pain Nausea Recurrent constipation> - He is HDS, abdomen soft but is mildly tender on palpation generalized.  Nausea no vomiting - Labs are stable - CT revealing for constipation    Clinical Course as of 03/10/24 1530  Tue Mar 04, 2024  2143 Creatinine(!): 1.88 Similar to prior [SG]  2206 Hemoglobin(!): 9.2 Similar to prior [SG]    Clinical Course User Index [SG] Elnor Jayson LABOR, DO     Patient had large bowel movement after returning from CT. He is feeling much better after bowel movement Instructed upon bowel regimen for home.   I have discussed the diagnosis/risks/treatment options with the patient and family.  Evaluation and diagnostic testing in the emergency department does not suggest an emergent condition requiring admission or immediate intervention beyond what has been performed at this time.  They will follow up with pcp. We also discussed returning to the ED immediately if new or worsening sx occur. We discussed the sx which are most concerning (e.g., sudden worsening pain, fever, inability to tolerate by mouth) that necessitate immediate return.    The patient appears reasonably screened and/or  stabilized for discharge and I doubt any other medical condition or other [facility] requiring further screening, evaluation, or treatment in the ED at this time prior to discharge.  Additional history obtained: -Additional history obtained from spouse -External records from outside source obtained and reviewed including: Chart review including previous notes, labs, imaging, consultation notes including  Prior cardiology documentation   Lab Tests: -I ordered, reviewed, and interpreted labs.   The pertinent results include:   Labs Reviewed  CBC WITH DIFFERENTIAL/PLATELET - Abnormal; Notable for the following components:      Result Value   RBC 2.69 (*)    Hemoglobin 9.2 (*)    HCT 28.7 (*)    MCV 106.7 (*)    MCH 34.2 (*)    RDW 20.4 (*)    All other components within normal limits  COMPREHENSIVE METABOLIC PANEL WITH GFR - Abnormal; Notable for the following components:   CO2 16 (*)    Glucose, Bld 123 (*)    BUN 27 (*)    Creatinine, Ser 1.88 (*)    GFR, Estimated 37 (*)    Anion gap 17 (*)    All other components within normal limits  LIPASE, BLOOD    Notable for stable labs  EKG   EKG Interpretation Date/Time:    Ventricular Rate:    PR Interval:    QRS Duration:    QT Interval:    QTC Calculation:   R Axis:      Text Interpretation:           Imaging Studies ordered: I ordered imaging studies including CTAP I independently visualized the following imaging with scope of interpretation limited to determining acute life threatening conditions related to emergency care; findings noted above I agree with the radiologist interpretation If any imaging was obtained with contrast I closely monitored patient for any possible adverse reaction a/w contrast administration in the emergency department   Medicines ordered and prescription drug management: Meds ordered this encounter  Medications   ondansetron  (ZOFRAN ) injection 4 mg   sodium  chloride 0.9 % bolus 500 mL   iohexol  (OMNIPAQUE ) 300 MG/ML solution 75 mL    -I have reviewed the patients home medicines and have made adjustments as needed   Consultations Obtained: na   Cardiac Monitoring: Continuous pulse oximetry interpreted by myself, 100% on RA.    Social Determinants of Health:  Diagnosis or treatment significantly limited by social determinants of health: na   Reevaluation: After the interventions noted above, I reevaluated the patient and found that they have improved  Co morbidities that complicate the patient evaluation  Past Medical History:  Diagnosis Date   A-fib [facility])    AICD (automatic cardioverter/defibrillator) present    Barrett's esophagus 2011   In [place] . Records in Epic   CHF (congestive heart failure) (HCC)    Chronic kidney disease    Coronary atherosclerosis of native coronary artery    Nonobstructive   CVA (cerebral vascular accident) (HCC) 01/2024   Dyspnea    Essential hypertension, benign    GERD (gastroesophageal reflux disease)    Heart block AV first degree    Chronic   History of kidney stones    Hypercholesterolemia    Mixed hyperlipidemia    Myocardial infarction [facility])    NSVT (nonsustained ventricular tachycardia) (HCC)    Holter monitor, 2011; recommended RF ablation by Dr. Kelsie - never required   PAF (paroxysmal atrial fibrillation) [facility])    [facility] documented in [facility] June 2016   Pre-diabetes    Rheumatoid arthritis (HCC)    Sinus bradycardia    Symptomatic, acebutolol discontinued   Skin cancer    Sleep apnea  pt denies   TIA (transient ischemic attack) 08/2023   x2      Dispostion: Disposition decision including need for hospitalization was considered, and patient discharged from emergency department.    Final Clinical Impression(s) / ED Diagnoses Final diagnoses:  Constipation, unspecified constipation type         [1]  Social History Tobacco Use   Smoking status: Never     Passive exposure: Never   Smokeless tobacco: Never  Vaping Use   Vaping status: Never Used  Substance Use Topics   Alcohol  use: Not Currently   Drug use: No     Elnor Jayson LABOR, DO 03/10/24 1530  "

## 2024-03-04 NOTE — ED Notes (Signed)
 Pt assisted to the bathroom via wheelchair. No stool output, only liquid.

## 2024-03-04 NOTE — Discharge Instructions (Addendum)
  It was a pleasure caring for you today in the emergency department.  Please follow up with your primary care doctor within 2-3 days. For constipation we also recommend a diet high in fiber (beans, fruits, vegetables, whole grains). Take Colace 100-200 mg up to three times per day. You may take along with Senokot 1-2 tabs, ingest with full glass of water .  You may also take MiraLAX  1-2 capfuls 1-2 times a day until stools become soft and then slowly decrease the amount of MiraLAX  used.  Maintain fluid intake 6-8 glasses per day. Please increase fibers in your diet. You may also take Milk of Magnesia 30 mL as needed for constipation, you may repeat in 2 hours again if no bowl movement.   Please return to the emergency department for any worsening or worrisome symptoms.

## 2024-03-14 ENCOUNTER — Encounter (HOSPITAL_COMMUNITY)

## 2024-03-18 LAB — BASIC METABOLIC PANEL WITH GFR
BUN/Creatinine Ratio: 16 (ref 10–24)
BUN: 32 mg/dL — ABNORMAL HIGH (ref 8–27)
CO2: 16 mmol/L — ABNORMAL LOW (ref 20–29)
Calcium: 9.1 mg/dL (ref 8.6–10.2)
Chloride: 107 mmol/L — ABNORMAL HIGH (ref 96–106)
Creatinine, Ser: 2.05 mg/dL — ABNORMAL HIGH (ref 0.76–1.27)
Glucose: 75 mg/dL (ref 70–99)
Potassium: 4.2 mmol/L (ref 3.5–5.2)
Sodium: 140 mmol/L (ref 134–144)
eGFR: 33 mL/min/1.73 — ABNORMAL LOW

## 2024-03-29 ENCOUNTER — Emergency Department (HOSPITAL_COMMUNITY)

## 2024-03-29 ENCOUNTER — Other Ambulatory Visit: Payer: Self-pay

## 2024-03-29 ENCOUNTER — Emergency Department (HOSPITAL_COMMUNITY)
Admission: EM | Admit: 2024-03-29 | Discharge: 2024-03-30 | Disposition: A | Attending: Emergency Medicine | Admitting: Emergency Medicine

## 2024-03-29 ENCOUNTER — Encounter (HOSPITAL_COMMUNITY): Payer: Self-pay

## 2024-03-29 DIAGNOSIS — K219 Gastro-esophageal reflux disease without esophagitis: Secondary | ICD-10-CM | POA: Diagnosis not present

## 2024-03-29 DIAGNOSIS — R079 Chest pain, unspecified: Secondary | ICD-10-CM | POA: Diagnosis not present

## 2024-03-29 DIAGNOSIS — J101 Influenza due to other identified influenza virus with other respiratory manifestations: Secondary | ICD-10-CM | POA: Insufficient documentation

## 2024-03-29 DIAGNOSIS — Z79899 Other long term (current) drug therapy: Secondary | ICD-10-CM | POA: Diagnosis not present

## 2024-03-29 DIAGNOSIS — Z955 Presence of coronary angioplasty implant and graft: Secondary | ICD-10-CM | POA: Insufficient documentation

## 2024-03-29 DIAGNOSIS — M7989 Other specified soft tissue disorders: Secondary | ICD-10-CM | POA: Diagnosis not present

## 2024-03-29 DIAGNOSIS — R109 Unspecified abdominal pain: Secondary | ICD-10-CM | POA: Diagnosis not present

## 2024-03-29 DIAGNOSIS — R410 Disorientation, unspecified: Secondary | ICD-10-CM | POA: Insufficient documentation

## 2024-03-29 DIAGNOSIS — D72819 Decreased white blood cell count, unspecified: Secondary | ICD-10-CM | POA: Diagnosis not present

## 2024-03-29 DIAGNOSIS — I130 Hypertensive heart and chronic kidney disease with heart failure and stage 1 through stage 4 chronic kidney disease, or unspecified chronic kidney disease: Secondary | ICD-10-CM | POA: Diagnosis not present

## 2024-03-29 DIAGNOSIS — I4891 Unspecified atrial fibrillation: Secondary | ICD-10-CM | POA: Insufficient documentation

## 2024-03-29 DIAGNOSIS — N183 Chronic kidney disease, stage 3 unspecified: Secondary | ICD-10-CM | POA: Diagnosis not present

## 2024-03-29 DIAGNOSIS — I2510 Atherosclerotic heart disease of native coronary artery without angina pectoris: Secondary | ICD-10-CM | POA: Diagnosis not present

## 2024-03-29 DIAGNOSIS — I5022 Chronic systolic (congestive) heart failure: Secondary | ICD-10-CM | POA: Diagnosis not present

## 2024-03-29 DIAGNOSIS — Z95810 Presence of automatic (implantable) cardiac defibrillator: Secondary | ICD-10-CM | POA: Insufficient documentation

## 2024-03-29 DIAGNOSIS — R531 Weakness: Secondary | ICD-10-CM | POA: Insufficient documentation

## 2024-03-29 DIAGNOSIS — Z96642 Presence of left artificial hip joint: Secondary | ICD-10-CM | POA: Diagnosis not present

## 2024-03-29 DIAGNOSIS — R600 Localized edema: Secondary | ICD-10-CM | POA: Diagnosis not present

## 2024-03-29 DIAGNOSIS — Z7901 Long term (current) use of anticoagulants: Secondary | ICD-10-CM | POA: Insufficient documentation

## 2024-03-29 DIAGNOSIS — Z85828 Personal history of other malignant neoplasm of skin: Secondary | ICD-10-CM | POA: Insufficient documentation

## 2024-03-29 DIAGNOSIS — R059 Cough, unspecified: Secondary | ICD-10-CM | POA: Diagnosis present

## 2024-03-29 LAB — PROTIME-INR
INR: 1.3 — ABNORMAL HIGH (ref 0.8–1.2)
Prothrombin Time: 16.8 s — ABNORMAL HIGH (ref 11.4–15.2)

## 2024-03-29 MED ORDER — SODIUM CHLORIDE 0.9 % IV BOLUS
500.0000 mL | Freq: Once | INTRAVENOUS | Status: AC
  Administered 2024-03-30: 500 mL via INTRAVENOUS

## 2024-03-29 NOTE — ED Triage Notes (Signed)
 Pt reports CP, SHOB, and abd pain since earlier today. Pt states that he is scheduled to get a watchman device next month.

## 2024-03-30 ENCOUNTER — Emergency Department (HOSPITAL_COMMUNITY)

## 2024-03-30 LAB — HEPATIC FUNCTION PANEL
ALT: 18 U/L (ref 0–44)
AST: 26 U/L (ref 15–41)
Albumin: 3.9 g/dL (ref 3.5–5.0)
Alkaline Phosphatase: 97 U/L (ref 38–126)
Bilirubin, Direct: 0.3 mg/dL — ABNORMAL HIGH (ref 0.0–0.2)
Indirect Bilirubin: 0.5 mg/dL (ref 0.3–0.9)
Total Bilirubin: 0.8 mg/dL (ref 0.0–1.2)
Total Protein: 7.4 g/dL (ref 6.5–8.1)

## 2024-03-30 LAB — CBC
HCT: 30.1 % — ABNORMAL LOW (ref 39.0–52.0)
Hemoglobin: 9.7 g/dL — ABNORMAL LOW (ref 13.0–17.0)
MCH: 35 pg — ABNORMAL HIGH (ref 26.0–34.0)
MCHC: 32.2 g/dL (ref 30.0–36.0)
MCV: 108.7 fL — ABNORMAL HIGH (ref 80.0–100.0)
Platelets: 160 K/uL (ref 150–400)
RBC: 2.77 MIL/uL — ABNORMAL LOW (ref 4.22–5.81)
RDW: 15.5 % (ref 11.5–15.5)
WBC: 1.6 K/uL — ABNORMAL LOW (ref 4.0–10.5)
nRBC: 0 % (ref 0.0–0.2)

## 2024-03-30 LAB — BASIC METABOLIC PANEL WITH GFR
Anion gap: 15 (ref 5–15)
BUN: 24 mg/dL — ABNORMAL HIGH (ref 8–23)
CO2: 20 mmol/L — ABNORMAL LOW (ref 22–32)
Calcium: 9.1 mg/dL (ref 8.9–10.3)
Chloride: 100 mmol/L (ref 98–111)
Creatinine, Ser: 2.08 mg/dL — ABNORMAL HIGH (ref 0.61–1.24)
GFR, Estimated: 32 mL/min — ABNORMAL LOW
Glucose, Bld: 99 mg/dL (ref 70–99)
Potassium: 3.9 mmol/L (ref 3.5–5.1)
Sodium: 135 mmol/L (ref 135–145)

## 2024-03-30 LAB — RESP PANEL BY RT-PCR (RSV, FLU A&B, COVID)  RVPGX2
Influenza A by PCR: POSITIVE — AB
Influenza B by PCR: NEGATIVE
Resp Syncytial Virus by PCR: NEGATIVE
SARS Coronavirus 2 by RT PCR: NEGATIVE

## 2024-03-30 LAB — I-STAT CG4 LACTIC ACID, ED
Lactic Acid, Venous: 1.1 mmol/L (ref 0.5–1.9)
Lactic Acid, Venous: 3.1 mmol/L (ref 0.5–1.9)

## 2024-03-30 LAB — TROPONIN T, HIGH SENSITIVITY
Troponin T High Sensitivity: 36 ng/L — ABNORMAL HIGH (ref 0–19)
Troponin T High Sensitivity: 36 ng/L — ABNORMAL HIGH (ref 0–19)

## 2024-03-30 LAB — LIPASE, BLOOD: Lipase: 40 U/L (ref 11–51)

## 2024-03-30 MED ORDER — BENZONATATE 100 MG PO CAPS: 100.0000 mg | ORAL_CAPSULE | Freq: Three times a day (TID) | ORAL | 0 refills | Status: AC | PRN

## 2024-03-30 MED ORDER — IOHEXOL 350 MG/ML SOLN
100.0000 mL | Freq: Once | INTRAVENOUS | Status: AC | PRN
  Administered 2024-03-30: 100 mL via INTRAVENOUS

## 2024-03-30 MED ORDER — OSELTAMIVIR PHOSPHATE 30 MG PO CAPS
30.0000 mg | ORAL_CAPSULE | Freq: Once | ORAL | Status: AC
  Administered 2024-03-30: 30 mg via ORAL
  Filled 2024-03-30: qty 1

## 2024-03-30 MED ORDER — SODIUM CHLORIDE 0.9 % IV BOLUS
1000.0000 mL | Freq: Once | INTRAVENOUS | Status: AC
  Administered 2024-03-30: 1000 mL via INTRAVENOUS

## 2024-03-30 MED ORDER — ACETAMINOPHEN 500 MG PO TABS: 1000.0000 mg | ORAL_TABLET | Freq: Once | ORAL | Status: DC

## 2024-03-30 MED ORDER — OSELTAMIVIR PHOSPHATE 30 MG PO CAPS: 30.0000 mg | ORAL_CAPSULE | Freq: Two times a day (BID) | ORAL | 0 refills | Status: AC

## 2024-03-30 MED ORDER — ACETAMINOPHEN 650 MG RE SUPP
650.0000 mg | Freq: Once | RECTAL | Status: AC
  Administered 2024-03-30: 650 mg via RECTAL
  Filled 2024-03-30: qty 1

## 2024-03-30 NOTE — ED Notes (Signed)
 Pt ambulated in hallway using rolling walker, steady gate noted, pt denies any complaints at this time.

## 2024-03-30 NOTE — ED Notes (Signed)
 Pt disoriented to time and date, family at bedside reports this is not normal for pt baseline and reports he became confused around 7pm and has hx of stroke. Attempted to perform NIH and pt unable to follow commands r/t confusion and generalized weakness, MD notified immediatly. Rectal temp 102 pt given tylenol  suppository, phlebotomy consulted for 2nd set of blood cultures.

## 2024-03-30 NOTE — Discharge Instructions (Signed)
 We evaluated you for your cough and fever. Your testing shows that you have the flu. We have prescribed you an antiviral medication.   Please take 1000 mg of tylenol  every 6 hours as needed for fevers. Please make sure you are drinking plenty of fluids.   Please return if you have any new or worsening symptoms such as difficulty breathing, worsening weakness, or any other new symptoms.

## 2024-03-30 NOTE — ED Provider Notes (Signed)
 " [place] [facility] Provider Note  CSN: [id number] Arrival date & time: 03/29/24 2302  Chief Complaint(s) Chest Pain and Shortness of Breath  HPI Brent Ray. is a 79 y.o. male history of CHF, AICD, CKD, prior stroke, atrial fibrillation on Eliquis  presenting with weakness.  Patient reports feeling very weak, family has noted that he has had a cough since yesterday, coughing up phlegm.  He is also complaining of some left-sided chest pain, as well as abdominal pain.  Family reports that he seem mildly confused and weak.  No sick contacts.  Reports some left leg swelling, no right leg swelling.  Has some degree of chronic swelling of the left lower extremity from old accident.  Has been compliant with his medications including Eliquis .   Past Medical History Past Medical History:  Diagnosis Date   A-fib [facility])    AICD (automatic cardioverter/defibrillator) present    Barrett's esophagus 2011   In [place] . Records in Epic   CHF (congestive heart failure) (HCC)    Chronic kidney disease    Coronary atherosclerosis of native coronary artery    Nonobstructive   CVA (cerebral vascular accident) (HCC) 01/2024   Dyspnea    Essential hypertension, benign    GERD (gastroesophageal reflux disease)    Heart block AV first degree    Chronic   History of kidney stones    Hypercholesterolemia    Mixed hyperlipidemia    Myocardial infarction [facility])    NSVT (nonsustained ventricular tachycardia) (HCC)    Holter monitor, 2011; recommended RF ablation by Dr. 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) 01/04/2024   Presence of cardiac resynchronization therapy defibrillator (CRT-D) 01/04/2024   Long term (current) use of anticoagulants 01/04/2024   Severe mitral regurgitation 01/04/2024   Anemia 01/04/2024   Protein-calorie malnutrition, severe 01/04/2024   Gastric erosion 01/04/2024   History of stroke 01/02/2024   Macrocytic anemia 01/02/2024   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   Internal carotid artery occlusion, 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   Dizziness 08/21/2023   Metabolic acidosis 07/31/2023   Macrocytosis 07/31/2023   Shortness of breath 07/31/2023   Rheumatism 07/31/2023   Peptic ulcer disease 07/31/2023   Dyslipidemia 07/31/2023   Preop cardiovascular exam 04/02/2023   AKI (acute kidney injury) 04/02/2023   Chronic systolic CHF (congestive heart failure) (HCC) 01/19/2023   Paroxysmal atrial flutter (HCC) 01/19/2023   Drug-induced gynecomastia 01/19/2023   Moderate mitral regurgitation 01/19/2023   Moderate tricuspid regurgitation 01/19/2023   History of GI bleed 01/19/2023   CKD (chronic kidney disease) stage 3, GFR 30-59 ml/min (HCC) 01/19/2023   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   Avascular necrosis of hip, 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/11/2022   Dysphagia 12/08/2019   Abdominal pain 12/08/2019   Rectal bleeding 12/08/2019   Persistent atrial fibrillation (HCC) 02/29/2016   History of panic attacks 02/29/2016   Second degree AV block 02/01/2016  D-dimer, elevated    Permanent atrial fibrillation (HCC)    NICM (nonischemic cardiomyopathy) (HCC)    Anticoagulated    Ischemic chest pain 12/13/2015   Fatigue 11/09/2014   Internal hemorrhoids 10/31/2013   Esophageal reflux 10/31/2013   CAD in native artery    Sinus bradycardia    Heart block AV first degree    NSVT (nonsustained ventricular tachycardia) (HCC)     Mixed hyperlipidemia    Bradycardia 08/21/2012   Chest pain 01/21/2012   PAF (paroxysmal atrial fibrillation) (HCC) 04/11/2010   PALPITATIONS 03/02/2010   Frequent PVCs 02/01/2010   Rheumatoid arthritis (HCC) 02/01/2010   Essential hypertension, benign 01/31/2010   Home Medication(s) Prior to Admission medications  Medication Sig Start Date End Date Taking? Authorizing Provider  benzonatate  (TESSALON ) 100 MG capsule Take 1 capsule (100 mg total) by mouth 3 (three) times daily as needed for cough. 03/30/24  Yes Francesca Elsie CROME, MD  oseltamivir  (TAMIFLU ) 30 MG capsule Take 1 capsule (30 mg total) by mouth 2 (two) times daily for 5 days. 03/30/24 04/04/24 Yes Francesca Elsie CROME, MD  acetaminophen  (TYLENOL ) 500 MG tablet Take 1,000 mg by mouth 2 (two) times daily as needed for fever or headache (pain).    [provider]  apixaban  (ELIQUIS ) 2.5 MG TABS tablet Take 1 tablet (2.5 mg total) by mouth 2 (two) times daily. 01/15/24   Lehner, Erin C, NP  bisacodyl (DULCOLAX) 10 MG suppository Place 10 mg rectally daily as needed for severe constipation.    [provider]  folic acid  (FOLVITE ) 1 MG tablet Take 1 mg by mouth See admin instructions. Take 1 tablet (1mg ) by mouth daily, 6 days a week. Do not take folic acid  on Friday. 12/13/22   [provider]  furosemide  (LASIX ) 20 MG tablet Take 1 tablet (20 mg total) by mouth 2 (two) times daily. 01/08/24   Milford, Harlene HERO, FNP  hydrocortisone  (ANUSOL -HC) 2.5 % rectal cream Place 1 Application rectally 2 (two) times daily. 01/07/24   Dean Clarity, MD  losartan  (COZAAR ) 25 MG tablet Take 0.5 tablets (12.5 mg total) by mouth at bedtime. Patient taking differently: Take 25 mg by mouth at bedtime. 01/08/24   Glena Harlene HERO, FNP  methotrexate  (RHEUMATREX) 2.5 MG tablet Take 15 mg by mouth every Friday.    [provider]  mexiletine (MEXITIL ) 150 MG capsule Take 1 capsule (150 mg total) by mouth 2 (two) times  daily. 01/16/24   Mealor, Augustus E, MD  NITROSTAT  0.4 MG SL tablet DISSOLVE 1 TABLET UNDER TONGUE EVERY 5 MINUTES AS NEEDED FOR CHEST PAIN 05/11/14   Debera Jayson MATSU, MD  NONFORMULARY OR COMPOUNDED ITEM Use as directed 5 mLs in the mouth or throat 4 (four) times daily as needed (mouth pain). Pharmacy compounded Magic Mouthwash:  unknown quantities of nystatin/hydrocortisone /lidocaine /diphenhydramine  Swish and spit 5mL up to 4 times daily as needed for mouth pain.    [provider]  pantoprazole  (PROTONIX ) 40 MG tablet Take 1 tablet (40 mg total) by mouth 2 (two) times daily before a meal. Patient taking differently: Take 40 mg by mouth 2 (two) times daily as needed (Heartburn). 01/06/24 03/06/24  Pokhrel, Laxman, MD  potassium chloride  SA (KLOR-CON  M) 20 MEQ tablet Take 40 meq in am and 20 meq in pm Patient taking differently: Take 40 meq in am and 40 meq in pm 01/28/24   Milford, Harlene HERO, FNP  senna-docusate (SENOKOT-S) 8.6-50 MG tablet Take 2 tablets by mouth at bedtime. Patient  taking differently: Take 2 tablets by mouth at bedtime as needed for mild constipation or moderate constipation. 01/06/24 04/05/24  Pokhrel, Laxman, MD  sodium bicarbonate  650 MG tablet Take 650 mg by mouth in the morning.    [provider]  traMADol  (ULTRAM ) 50 MG tablet Take 50 mg by mouth every 6 (six) hours as needed for moderate pain (pain score 4-6).    [provider]                                                                                                                                    Past Surgical History Past Surgical History:  Procedure Laterality Date   CARDIAC CATHETERIZATION N/A 12/13/2015   Procedure: Left Heart Cath and Coronary Angiography;  Surgeon: Dorn JINNY Lesches, MD;  Location: [facility] INVASIVE CV LAB;  Service: Cardiovascular;  Laterality: N/A;   CARDIOVERSION N/A 04/11/2016   Procedure: CARDIOVERSION;  Surgeon: Annabella Scarce, MD;  Location: [facility] ENDOSCOPY;   Service: Cardiovascular;  Laterality: N/A;   COLONOSCOPY  03/13/2012   [place], [place]--Dr. Spainhour   EP IMPLANTABLE DEVICE N/A 02/01/2016   Procedure: Pacemaker Implant;  Surgeon: Lynwood Rakers, MD;  Location: [facility] INVASIVE CV LAB;  Service: Cardiovascular;  Laterality: N/A;   ESOPHAGOGASTRODUODENOSCOPY  03/13/2012   [place], [place]--Dr. Spainhour   ESOPHAGOGASTRODUODENOSCOPY N/A 01/04/2024   Procedure: EGD (ESOPHAGOGASTRODUODENOSCOPY);  Surgeon: Shaaron Lamar HERO, MD;  Location: [facility] ENDO SUITE;  Service: Endoscopy;  Laterality: N/A;   HERNIA REPAIR     2   INSERT / REPLACE / REMOVE PACEMAKER     IR ANGIO INTRA EXTRACRAN SEL COM CAROTID INNOMINATE BILAT MOD SED  08/27/2023   IR ANGIO VERTEBRAL SEL VERTEBRAL UNI L MOD SED  08/27/2023   IR CT HEAD LTD  01/12/2024   IR PERCUTANEOUS ART THROMBECTOMY/INFUSION INTRACRANIAL INC DIAG ANGIO  01/12/2024   IR US  GUIDE VASC ACCESS RIGHT  01/12/2024   KIDNEY STONE SURGERY     LEFT ATRIAL APPENDAGE OCCLUSION N/A 02/28/2024   Procedure: LEFT ATRIAL APPENDAGE OCCLUSION;  Surgeon: Almetta Donnice LABOR, MD;  Location: [facility] INVASIVE CV LAB;  Service: Cardiovascular;  Laterality: N/A;   LEFT HEART CATHETERIZATION WITH CORONARY ANGIOGRAM N/A 08/20/2012   Procedure: LEFT HEART CATHETERIZATION WITH CORONARY ANGIOGRAM;  Surgeon: Lynwood Schilling, MD;  Location: [facility] CATH LAB;  Service: Cardiovascular;  Laterality: N/A;   Left leg surgery  03/13/1977   RADIOLOGY WITH ANESTHESIA N/A 08/27/2023   Procedure: RADIOLOGY WITH ANESTHESIA;  Surgeon: Dolphus Carrion, MD;  Location: [facility] OR;  Service: Radiology;  Laterality: N/A;   RADIOLOGY WITH ANESTHESIA N/A 01/12/2024   Procedure: RADIOLOGY WITH ANESTHESIA;  Surgeon: Radiologist, Medication, MD;  Location: [facility] OR;  Service: Radiology;  Laterality: N/A;   SKIN CANCER EXCISION     TOTAL HIP ARTHROPLASTY Left 01/11/2022   Procedure: TOTAL HIP ARTHROPLASTY ANTERIOR APPROACH;  Surgeon: Fidel Rogue, MD;  Location: [facility] ORS;  Service:  Orthopedics;  Laterality: Left;  150   TRANSESOPHAGEAL ECHOCARDIOGRAM (CATH LAB) N/A 02/28/2024   Procedure: TRANSESOPHAGEAL ECHOCARDIOGRAM;  Surgeon: Almetta Donnice LABOR, MD;  Location[facility] INVASIVE CV LAB;  Service: Cardiovascular;  Laterality: N/A;   Family History Family History  Problem Relation Age of Onset   Colon cancer Mother    Other Sister         AGE-8-HEALTHY   Other Sister        AGE 75-HEALTHY   Other Sister        AGE 67 HEALTHY   Other Son        AGE 74 HEALTHY    Social History Social History[1] Allergies Flecainide , Flomax [tamsulosin], Pacerone [amiodarone], Aldactone  [spironolactone ], Coreg  [carvedilol ], Entresto [sacubitril-valsartan], Farxiga [dapagliflozin], Inspra [eplerenone], Lopressor  [metoprolol ], Zestril  [lisinopril ], Ferosul [ferrous sulfate ], Azulfidine [sulfasalazine], and Statins  Review of Systems Review of Systems  All other systems reviewed and are negative.   Physical Exam Vital Signs  I have reviewed the triage vital signs BP 116/66 (BP Location: Right Arm)   Pulse 68   Temp 98.5 F (36.9 C) (Oral)   Resp 20   Ht 5' 8 (1.727 m)   Wt 51.7 kg   SpO2 100%   BMI 17.33 kg/m  Physical Exam Vitals and nursing note reviewed.  Constitutional:      General: He is not in acute distress.    Appearance: Normal appearance. He is ill-appearing.  HENT:     Mouth/Throat:     Mouth: Mucous membranes are dry.  Eyes:     Conjunctiva/sclera: Conjunctivae normal.  Cardiovascular:     Rate and Rhythm: Normal rate and regular rhythm.     Pulses:          Radial pulses are 2+ on the right side and 2+ on the left side.  Pulmonary:     Effort: Pulmonary effort is normal. No respiratory distress.     Breath sounds: Normal breath sounds.  Abdominal:     General: Abdomen is flat.     Palpations: Abdomen is soft.     Tenderness: There is no abdominal tenderness.  Musculoskeletal:     Right lower leg: No edema.     Left lower leg: Edema present.   Skin:    General: Skin is warm and dry.     Capillary Refill: Capillary refill takes less than 2 seconds.  Neurological:     Mental Status: He is alert and oriented to person, place, and time. Mental status is at baseline.  Psychiatric:        Mood and Affect: Mood normal.        Behavior: Behavior normal.     ED Results and Treatments Labs (all labs ordered are listed, but only abnormal results are displayed) Labs Reviewed  RESP PANEL BY RT-PCR (RSV, FLU A&B, COVID)  RVPGX2 - Abnormal; Notable for the following components:      Result Value   Influenza A by PCR POSITIVE (*)    All other components within normal limits  BASIC METABOLIC PANEL WITH GFR - Abnormal; Notable for the following components:   CO2 20 (*)    BUN 24 (*)    Creatinine, Ser 2.08 (*)    GFR, Estimated 32 (*)    All other components within normal limits  CBC - Abnormal; Notable for the following components:   WBC 1.6 (*)    RBC 2.77 (*)    Hemoglobin 9.7 (*)    HCT 30.1 (*)    MCV 108.7 (*)  MCH 35.0 (*)    All other components within normal limits  PROTIME-INR - Abnormal; Notable for the following components:   Prothrombin Time 16.8 (*)    INR 1.3 (*)    All other components within normal limits  HEPATIC FUNCTION PANEL - Abnormal; Notable for the following components:   Bilirubin, Direct 0.3 (*)    All other components within normal limits  I-STAT CG4 LACTIC ACID, ED - Abnormal; Notable for the following components:   Lactic Acid, Venous 3.1 (*)    All other components within normal limits  TROPONIN T, HIGH SENSITIVITY - Abnormal; Notable for the following components:   Troponin T High Sensitivity 36 (*)    All other components within normal limits  TROPONIN T, HIGH SENSITIVITY - Abnormal; Notable for the following components:   Troponin T High Sensitivity 36 (*)    All other components within normal limits  CULTURE, BLOOD (ROUTINE X 2)  CULTURE, BLOOD (ROUTINE X 2)  LIPASE, BLOOD  I-STAT  CG4 LACTIC ACID, ED                                                                                                                          Radiology CT Angio Chest/Abd/Pel for Dissection W and/or W/WO Result Date: 03/30/2024 EXAM: CTA CHEST, ABDOMEN AND PELVIS WITH AND WITHOUT CONTRAST 03/30/2024 01:30:00 AM TECHNIQUE: CTA of the chest was performed with and without the administration of intravenous contrast. CTA of the abdomen and pelvis was performed only with the administration of intravenous contrast. Multiplanar reformatted images are provided for review. MIP images are provided for review. Automated exposure control, iterative reconstruction, and/or weight based adjustment of the mA/kV was utilized to reduce the radiation dose to as low as reasonably achievable. COMPARISON: AP and lateral chest from yesterday, abdomen and pelvis CT with contrast of 03/04/24, and CTA chest abdomen and pelvis 01/02/24. CLINICAL HISTORY: Chest pain, shortness of breath, and coughing. Suspected acute aortic syndrome. FINDINGS: VASCULATURE: AORTA: Thoracic aorta is normal in course and caliber. There are mild patchy calcific plaques in the arch and descending portions. The great vessels branch normally. The great vessels are widely patent. There is mild calcification at the right subclavian artery origin. There is no aortic or great vessel aneurysm, stenosis or dissection. The abdominal aorta demonstrates mild to moderate patchy calcific plaque without aneurysm, stenosis or dissection, or other acute aortic abnormality. PULMONARY ARTERIES: The pulmonary arteries are well opacified, upper limit of normal in caliber and free of thrombus. GREAT VESSELS OF AORTIC ARCH: The great vessels branch normally. The great vessels are widely patent. There is mild calcification at the right subclavian artery origin. No dissection. No arterial occlusion or significant stenosis. CELIAC TRUNK: There is calcific plaque of the vessel origin and  a 50% focal stenosis proximally due to compression by the median arcuate ligament of the diaphragm. The vessel otherwise opacifies well. SUPERIOR MESENTERIC ARTERY: No acute finding. No occlusion or significant stenosis. INFERIOR MESENTERIC ARTERY:  No acute finding. No occlusion or significant stenosis. RENAL ARTERIES: Renal arteries demonstrate near circumferential calcific plaques proximally, on the right causing irregular up to 60% stenosis in the proximal 1 cm of the vessel, on the left the calcifications are nonstenosing. ILIAC ARTERIES: There are mild calcific plaques in the common iliac arteries. The internal and external iliac arteries are plaque-free and all iliac arteries widely patent. Visualized proximal lower extremity outflow arteries are clear. CHEST: MEDIASTINUM: No mediastinal lymphadenopathy. The heart and pericardium demonstrate no acute abnormality. Mild cardiomegaly. No venous dilatation is seen. No pericardial effusion. There are patchy 3-vessel coronary calcifications. There is a left chest dual lead pacing system with right atrial and ventricular wire insertions and an aid wire through the coronary sinus. LUNGS AND PLEURA: There is diffuse bronchial thickening. There are occasional posterior basal subsegmental bronchial impactions in both lower lobes where the bronchial thickening is the greatest. Reticulated scarring and calcifications are again noted at the lung apices. Atelectasis in both lower lobes. There is a calcified granuloma in the right middle lobe. No other appreciable nodules. There is mild chronic subpleural reticulation in the lung bases below the level of the diaphragm. There is no consolidation, effusion, or pneumothorax. THORACIC BONES AND SOFT TISSUES: There is osteopenia and degenerative change of the thoracic spine. No acute or significant osseous findings. No acute soft tissue abnormality. ABDOMEN AND PELVIS: LIVER: There is capsular nodularity extending over the liver  consistent with cirrhosis. There is no portal vein dilatation or mass enhancement. Again noted is a 1.7 cm cyst in segment 3, hounsfield density is 14.3. GALLBLADDER AND BILE DUCTS: There is a solitary 5 mm stone in the proximal gallbladder. No wall thickening or bile duct dilatation. SPLEEN: The spleen is unremarkable. PANCREAS: The pancreas is unremarkable. ADRENAL GLANDS: Bilateral adrenal glands demonstrate no acute abnormality. There is no adrenal mass. KIDNEYS, URETERS AND BLADDER: No renal mass enhancement. Bilateral renal cortical thinning is again shown. There is a 4 mm collecting system stone in the superior pole of the left kidney. No other nephrolithiasis is seen. There is chronic perinephric stranding, stable. No ureteral stone or hydronephrosis. The bladder is obscured along the left side by scatter artifact from a left hip replacement, but the bladder is unremarkable where otherwise visible. GI AND BOWEL: Stomach and duodenal sweep demonstrate no acute abnormality. There is no small bowel obstruction or inflammation. Normal caliber appendix with distal intraluminal stones. There is either a mildly thickened wall or changes of underdistention in the distal ascending colon. This was not seen previously. There is diffuse diverticulosis, but no focal inflammatory reaction. There is no bowel obstruction. No abnormal bowel wall thickening or distension. REPRODUCTIVE: There are fiducial markers versus brachytherapy seeds in the prostate. The prostate is not enlarged. PERITONEUM AND RETROPERITONEUM: No ascites or free air. There is no free fluid, free hemorrhage, or incarcerated hernia. LYMPH NODES: No lymphadenopathy. ABDOMINAL BONES AND SOFT TISSUES: There is osteopenia with degenerative changes of the lumbar spine, chronic wedging of T12 and left hip arthroplasty. No acute or significant regional osseous finding. There is acquired spinal stenosis at L4-L5, seen previously. No acute soft tissue abnormality.  IMPRESSION: 1. No evidence of acute aortic syndrome. Aortic and branch vessel atherosclerosis as detailed above. The atherosclerosis is nonstenosing except for up to 60% irregular stenosis in the proximal right renal artery. 2. No pulmonary embolus. 3. Diffuse bronchial wall thickening with occasional lower lobe subsegmental bronchial impactions and mild bibasilar atelectasis, which can be seen with  bronchitis. No focal pneumonia is seen. 4. Mild cardiomegaly with patchy 3-vessel coronary calcifications. 5. 50% focal stenosis of the celiac artery proximally due to compression by the median arcuate ligament of the diaphragm. 6. Mild distal ascending colon wall thickening versus underdistention, new from prior, for which follow-up evaluation including endoscopy may be considered. Correlation recommended for possible colitis. 7. 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 Electronically signed by: Francis Quam MD 03/30/2024 02:39 AM EST RP Workstation: [id number]   CT Head Wo Contrast Result Date: 03/30/2024 EXAM: CT HEAD WITHOUT CONTRAST 03/30/2024 01:16:00 AM TECHNIQUE: CT of the head was performed without the administration of intravenous contrast. Automated exposure control, iterative reconstruction, and/or weight based adjustment of the mA/kV was utilized to reduce the radiation dose to as low as reasonably achievable. COMPARISON: 01/13/2024 CLINICAL HISTORY: Headache, new onset (Age >= 51y). FINDINGS: BRAIN AND VENTRICLES: No acute hemorrhage. No evidence of acute infarct. Mild cerebral volume loss. Periventricular white matter hypoattenuation consistent with chronic microvascular ischemic disease. Stable lacunar infarct in the right basal ganglia. Redemonstrated remote right cerebellar infarct. No extra-axial collection. No mass effect or midline shift. ORBITS: Bilateral lens replacements noted. SINUSES: No acute abnormality. SOFT TISSUES AND SKULL: No acute soft tissue  abnormality. No skull fracture. VASCULATURE: Calcified atherosclerotic plaque within cavernous/supraclinoid internal carotid arteries. IMPRESSION: 1. Chronic atrophic and ischemic changes without acute abnormality. Electronically signed by: Oneil Devonshire MD 03/30/2024 01:26 AM EST RP Workstation: [id number]   DG Chest 2 View Result Date: 03/29/2024 EXAM: 2 VIEW(S) XRAY OF THE CHEST 03/29/2024 11:28:00 PM COMPARISON: 01/02/2024 CLINICAL HISTORY: CP CP CP CP CP FINDINGS: LINES, TUBES AND DEVICES: Left chest wall 3 lead AICD in place. LUNGS AND PLEURA: No focal pulmonary opacity. No pleural effusion. No pneumothorax. HEART AND MEDIASTINUM: Aortic plaque. BONES AND SOFT TISSUES: No acute osseous abnormality. IMPRESSION: 1. No acute cardiopulmonary abnormality. 2. Left chest wall AICD in place. Electronically signed by: Greig Pique MD 03/29/2024 11:32 PM EST RP Workstation: [id number]    Pertinent labs & imaging results that were available during my care of the patient were reviewed by me and considered in my medical decision making (see MDM for details).  Medications Ordered in ED Medications  sodium chloride  0.9 % bolus 500 mL (0 mLs Intravenous Stopped 03/30/24 0354)  acetaminophen  (TYLENOL ) suppository 650 mg (650 mg Rectal Given 03/30/24 0041)  sodium chloride  0.9 % bolus 1,000 mL (0 mLs Intravenous Stopped 03/30/24 0454)  iohexol  (OMNIPAQUE ) 350 MG/ML injection 100 mL (100 mLs Intravenous Contrast Given 03/30/24 0154)  oseltamivir  (TAMIFLU ) capsule 30 mg (30 mg Oral Given 03/30/24 0540)                                                                                                                                     Procedures Procedures  (including critical care time)  Medical Decision Making / ED Course   MDM:  79 year old  presenting to the emergency department with weakness.  Patient no acute distress but mildly ill-appearing, oral temperature without fever but does have rectal temp.   Tylenol  has been given.  Suspect likely infection, labs with slight leukopenia which may indicate underlying viral infection.  Does have a junky sounding cough and some chest pain, and productive cough.  Chest x-ray is without evidence of focal infiltrate but given his degree of symptoms would obtain further imaging with a CT scan.  He is reporting some chest pain and abdominal pain, lower concern for dissection but given his comorbidities as well as some abdominal tenderness will obtain scan with dissection protocol which should also evaluate for any intra-abdominal or pulmonary source of underlying infectious process.  Given confusion will obtain CT head since patient is on Eliquis  but still expect this is more likely in the setting of underlying infection.  Patient denies headache or meningismus or neck pain, doubt CNS infection.  Will reassess.  Anticipate given his weakness he may need admission.  Clinical Course as of 03/30/24 9287  [place] Mar 30, 2024  0711 Workup notable for influenza, initial elevated lactate although repeat is reassuring.  Patient seems to be doing much better after receiving Tylenol .  CT scan without evidence of acute process.  Was able to ambulate without difficulty and repeat lactate has improved.  Blood cultures were obtained but suspect his symptoms could be explained by influenza.  Will prescribe Tamiflu .  Given his improvement, family feel comfortable with him going home, he feels comfortable with this plan.  Discussed that he can return if his symptoms worsen. Will discharge patient to home. All questions answered. Patient comfortable with plan of discharge. Return precautions discussed with patient and specified on the after visit summary.  [WS]    Clinical Course User Index [WS] Francesca, Elsie CROME, MD     Additional history obtained: -Additional history obtained from family -External records from outside source obtained and reviewed including: Chart review including  previous notes, labs, imaging, consultation notes including prior notes    Lab Tests: -I ordered, reviewed, and interpreted labs.   The pertinent results include:   Labs Reviewed  RESP PANEL BY RT-PCR (RSV, FLU A&B, COVID)  RVPGX2 - Abnormal; Notable for the following components:      Result Value   Influenza A by PCR POSITIVE (*)    All other components within normal limits  BASIC METABOLIC PANEL WITH GFR - Abnormal; Notable for the following components:   CO2 20 (*)    BUN 24 (*)    Creatinine, Ser 2.08 (*)    GFR, Estimated 32 (*)    All other components within normal limits  CBC - Abnormal; Notable for the following components:   WBC 1.6 (*)    RBC 2.77 (*)    Hemoglobin 9.7 (*)    HCT 30.1 (*)    MCV 108.7 (*)    MCH 35.0 (*)    All other components within normal limits  PROTIME-INR - Abnormal; Notable for the following components:   Prothrombin Time 16.8 (*)    INR 1.3 (*)    All other components within normal limits  HEPATIC FUNCTION PANEL - Abnormal; Notable for the following components:   Bilirubin, Direct 0.3 (*)    All other components within normal limits  I-STAT CG4 LACTIC ACID, ED - Abnormal; Notable for the following components:   Lactic Acid, Venous 3.1 (*)    All other components within normal limits  TROPONIN T,  HIGH SENSITIVITY - Abnormal; Notable for the following components:   Troponin T High Sensitivity 36 (*)    All other components within normal limits  TROPONIN T, HIGH SENSITIVITY - Abnormal; Notable for the following components:   Troponin T High Sensitivity 36 (*)    All other components within normal limits  CULTURE, BLOOD (ROUTINE X 2)  CULTURE, BLOOD (ROUTINE X 2)  LIPASE, BLOOD  I-STAT CG4 LACTIC ACID, ED    Notable for + flu, lactic acidosis improved on re-check  EKG   EKG Interpretation Date/Time:  Saturday March 29 2024 23:13:45 EST Ventricular Rate:  70 PR Interval:    QRS Duration:  148 QT Interval:  438 QTC  Calculation: 473 R Axis:   265  Text Interpretation: Ventricular-paced rhythm Biventricular pacemaker detected Abnormal ECG When compared with ECG of 22-Feb-2024 16:38, PREVIOUS ECG IS PRESENT Confirmed by Theadore Sharper [phone]) on 03/29/2024 11:20:01 PM         Imaging Studies ordered: I ordered imaging studies including CXR, CT scans On my interpretation imaging demonstrates bronchitis  I independently visualized and interpreted imaging. I agree with the radiologist interpretation   Medicines ordered and prescription drug management: Meds ordered this encounter  Medications   sodium chloride  0.9 % bolus 500 mL   DISCONTD: acetaminophen  (TYLENOL ) tablet 1,000 mg   acetaminophen  (TYLENOL ) suppository 650 mg   sodium chloride  0.9 % bolus 1,000 mL   iohexol  (OMNIPAQUE ) 350 MG/ML injection 100 mL   oseltamivir  (TAMIFLU ) 30 MG capsule    Sig: Take 1 capsule (30 mg total) by mouth 2 (two) times daily for 5 days.    Dispense:  10 capsule    Refill:  0   oseltamivir  (TAMIFLU ) capsule 30 mg   benzonatate  (TESSALON ) 100 MG capsule    Sig: Take 1 capsule (100 mg total) by mouth 3 (three) times daily as needed for cough.    Dispense:  21 capsule    Refill:  0    -I have reviewed the patients home medicines and have made adjustments as needed     Reevaluation: After the interventions noted above, I reevaluated the patient and found that their symptoms have improved  Co morbidities that complicate the patient evaluation  Past Medical History:  Diagnosis Date   A-fib [facility])    AICD (automatic cardioverter/defibrillator) present    Barrett's esophagus 2011   In [place] . Records in Epic   CHF (congestive heart failure) (HCC)    Chronic kidney disease    Coronary atherosclerosis of native coronary artery    Nonobstructive   CVA (cerebral vascular accident) (HCC) 01/2024   Dyspnea    Essential hypertension, benign    GERD (gastroesophageal reflux disease)    Heart block AV  first degree    Chronic   History of kidney stones    Hypercholesterolemia    Mixed hyperlipidemia    Myocardial infarction [facility])    NSVT (nonsustained ventricular tachycardia) (HCC)    Holter monitor, 2011; recommended RF ablation by Dr. Kelsie - never required   PAF (paroxysmal atrial fibrillation) [facility])    Recently documented in [facility] June 2016   Pre-diabetes    Rheumatoid arthritis (HCC)    Sinus bradycardia    Symptomatic, acebutolol discontinued   Skin cancer    Sleep apnea    pt denies   TIA (transient ischemic attack) 08/2023   x2      Dispostion: Disposition decision including need for hospitalization was considered, and patient discharged from emergency  department.    Final Clinical Impression(s) / ED Diagnoses Final diagnoses:  Influenza A     This chart was dictated using voice recognition software.  Despite best efforts to proofread,  errors can occur which can change the documentation meaning.     [1]  Social History Tobacco Use   Smoking status: Never    Passive exposure: Never   Smokeless tobacco: Never  Vaping Use   Vaping status: Never Used  Substance Use Topics   Alcohol  use: Not Currently   Drug use: No     Francesca Elsie CROME, MD 03/30/24 [phone]  "

## 2024-04-04 LAB — CULTURE, BLOOD (ROUTINE X 2)
Culture: NO GROWTH
Culture: NO GROWTH
Special Requests: ADEQUATE

## 2024-04-15 ENCOUNTER — Telehealth: Payer: Self-pay

## 2024-04-15 ENCOUNTER — Ambulatory Visit: Admitting: Pulmonary Disease

## 2024-04-15 NOTE — Telephone Encounter (Signed)
 Left message for patient to return call to see how he is doing 45 days post aborted LAAO. He was referred to [facility].

## 2024-04-17 NOTE — Telephone Encounter (Signed)
 Left second message for patient to return call to see how he is doing.

## 2024-04-30 ENCOUNTER — Inpatient Hospital Stay: Admitting: Neurology

## 2024-05-06 ENCOUNTER — Ambulatory Visit: Admitting: Student in an Organized Health Care Education/Training Program

## 2024-05-15 ENCOUNTER — Ambulatory Visit: Admitting: Nurse Practitioner

## 2024-06-09 ENCOUNTER — Inpatient Hospital Stay: Admitting: Neurology
